# Patient Record
Sex: Female | Born: 1960 | Race: White | Hispanic: No | Smoking: Current every day smoker
Health system: Southern US, Community
[De-identification: ages and names within clinical notes are randomized; demographics above are authoritative.]

## PROBLEM LIST (undated history)

## (undated) DIAGNOSIS — Z9889 Other specified postprocedural states: Secondary | ICD-10-CM

## (undated) DIAGNOSIS — M545 Low back pain, unspecified: Secondary | ICD-10-CM

## (undated) DIAGNOSIS — M797 Fibromyalgia: Secondary | ICD-10-CM

## (undated) DIAGNOSIS — M1990 Unspecified osteoarthritis, unspecified site: Secondary | ICD-10-CM

## (undated) DIAGNOSIS — I10 Essential (primary) hypertension: Secondary | ICD-10-CM

## (undated) DIAGNOSIS — IMO0001 Reserved for inherently not codable concepts without codable children: Secondary | ICD-10-CM

## (undated) DIAGNOSIS — F329 Major depressive disorder, single episode, unspecified: Secondary | ICD-10-CM

## (undated) DIAGNOSIS — I499 Cardiac arrhythmia, unspecified: Secondary | ICD-10-CM

## (undated) DIAGNOSIS — G8929 Other chronic pain: Secondary | ICD-10-CM

## (undated) DIAGNOSIS — F32A Depression, unspecified: Secondary | ICD-10-CM

## (undated) DIAGNOSIS — J189 Pneumonia, unspecified organism: Secondary | ICD-10-CM

## (undated) DIAGNOSIS — M67919 Unspecified disorder of synovium and tendon, unspecified shoulder: Secondary | ICD-10-CM

## (undated) DIAGNOSIS — K219 Gastro-esophageal reflux disease without esophagitis: Secondary | ICD-10-CM

## (undated) DIAGNOSIS — F419 Anxiety disorder, unspecified: Secondary | ICD-10-CM

## (undated) DIAGNOSIS — J45998 Other asthma: Secondary | ICD-10-CM

## (undated) DIAGNOSIS — R112 Nausea with vomiting, unspecified: Secondary | ICD-10-CM

## (undated) HISTORY — DX: Low back pain, unspecified: M54.50

## (undated) HISTORY — DX: Major depressive disorder, single episode, unspecified: F32.9

## (undated) HISTORY — PX: BACK SURGERY: SHX140

## (undated) HISTORY — DX: Other chronic pain: G89.29

## (undated) HISTORY — PX: LUMBAR MICRODISCECTOMY: SHX99

## (undated) HISTORY — DX: Low back pain: M54.5

## (undated) HISTORY — DX: Essential (primary) hypertension: I10

## (undated) HISTORY — DX: Anxiety disorder, unspecified: F41.9

## (undated) HISTORY — PX: POSTERIOR LUMBAR FUSION: SHX6036

## (undated) HISTORY — DX: Depression, unspecified: F32.A

---

## 1984-02-22 HISTORY — PX: TUBAL LIGATION: SHX77

## 1990-02-21 HISTORY — PX: REPAIR DURAL / CSF LEAK: SUR1169

## 1991-03-25 HISTORY — PX: LUMBAR SPINE SURGERY: SHX701

## 1997-07-10 ENCOUNTER — Emergency Department (HOSPITAL_COMMUNITY): Admission: EM | Admit: 1997-07-10 | Discharge: 1997-07-10 | Payer: Self-pay | Admitting: Emergency Medicine

## 1997-08-29 ENCOUNTER — Emergency Department (HOSPITAL_COMMUNITY): Admission: EM | Admit: 1997-08-29 | Discharge: 1997-08-29 | Payer: Self-pay | Admitting: Emergency Medicine

## 1997-09-03 ENCOUNTER — Emergency Department (HOSPITAL_COMMUNITY): Admission: EM | Admit: 1997-09-03 | Discharge: 1997-09-03 | Payer: Self-pay | Admitting: Emergency Medicine

## 1997-12-15 ENCOUNTER — Emergency Department (HOSPITAL_COMMUNITY): Admission: EM | Admit: 1997-12-15 | Discharge: 1997-12-15 | Payer: Self-pay | Admitting: Emergency Medicine

## 1998-01-07 ENCOUNTER — Emergency Department (HOSPITAL_COMMUNITY): Admission: EM | Admit: 1998-01-07 | Discharge: 1998-01-07 | Payer: Self-pay | Admitting: Family Medicine

## 1998-03-21 ENCOUNTER — Emergency Department (HOSPITAL_COMMUNITY): Admission: EM | Admit: 1998-03-21 | Discharge: 1998-03-21 | Payer: Self-pay | Admitting: Emergency Medicine

## 1998-03-21 ENCOUNTER — Encounter: Payer: Self-pay | Admitting: Emergency Medicine

## 1998-05-16 ENCOUNTER — Emergency Department (HOSPITAL_COMMUNITY): Admission: EM | Admit: 1998-05-16 | Discharge: 1998-05-16 | Payer: Self-pay | Admitting: Emergency Medicine

## 1998-09-03 ENCOUNTER — Emergency Department (HOSPITAL_COMMUNITY): Admission: EM | Admit: 1998-09-03 | Discharge: 1998-09-03 | Payer: Self-pay

## 1998-09-23 ENCOUNTER — Emergency Department (HOSPITAL_COMMUNITY): Admission: EM | Admit: 1998-09-23 | Discharge: 1998-09-23 | Payer: Self-pay | Admitting: Emergency Medicine

## 1998-11-16 ENCOUNTER — Emergency Department (HOSPITAL_COMMUNITY): Admission: EM | Admit: 1998-11-16 | Discharge: 1998-11-16 | Payer: Self-pay | Admitting: Family Medicine

## 1998-11-16 ENCOUNTER — Encounter: Payer: Self-pay | Admitting: Emergency Medicine

## 1998-12-22 ENCOUNTER — Emergency Department (HOSPITAL_COMMUNITY): Admission: EM | Admit: 1998-12-22 | Discharge: 1998-12-22 | Payer: Self-pay | Admitting: Emergency Medicine

## 1999-01-20 ENCOUNTER — Other Ambulatory Visit: Admission: RE | Admit: 1999-01-20 | Discharge: 1999-01-20 | Payer: Self-pay | Admitting: Family Medicine

## 2000-01-27 ENCOUNTER — Emergency Department (HOSPITAL_COMMUNITY): Admission: EM | Admit: 2000-01-27 | Discharge: 2000-01-27 | Payer: Self-pay | Admitting: *Deleted

## 2000-07-31 ENCOUNTER — Emergency Department (HOSPITAL_COMMUNITY): Admission: EM | Admit: 2000-07-31 | Discharge: 2000-07-31 | Payer: Self-pay | Admitting: Emergency Medicine

## 2000-09-14 ENCOUNTER — Emergency Department (HOSPITAL_COMMUNITY): Admission: EM | Admit: 2000-09-14 | Discharge: 2000-09-14 | Payer: Self-pay | Admitting: Emergency Medicine

## 2001-03-28 ENCOUNTER — Emergency Department (HOSPITAL_COMMUNITY): Admission: EM | Admit: 2001-03-28 | Discharge: 2001-03-28 | Payer: Self-pay | Admitting: Physical Therapy

## 2001-05-10 ENCOUNTER — Encounter: Payer: Self-pay | Admitting: Family Medicine

## 2001-05-10 ENCOUNTER — Encounter: Admission: RE | Admit: 2001-05-10 | Discharge: 2001-05-10 | Payer: Self-pay | Admitting: Family Medicine

## 2001-05-29 ENCOUNTER — Other Ambulatory Visit: Admission: RE | Admit: 2001-05-29 | Discharge: 2001-05-29 | Payer: Self-pay | Admitting: Family Medicine

## 2001-07-03 ENCOUNTER — Encounter: Payer: Self-pay | Admitting: Family Medicine

## 2001-07-03 ENCOUNTER — Encounter: Admission: RE | Admit: 2001-07-03 | Discharge: 2001-07-03 | Payer: Self-pay | Admitting: Family Medicine

## 2001-07-09 ENCOUNTER — Encounter: Payer: Self-pay | Admitting: *Deleted

## 2001-07-09 ENCOUNTER — Ambulatory Visit (HOSPITAL_COMMUNITY): Admission: RE | Admit: 2001-07-09 | Discharge: 2001-07-09 | Payer: Self-pay | Admitting: *Deleted

## 2001-07-19 ENCOUNTER — Ambulatory Visit (HOSPITAL_COMMUNITY): Admission: RE | Admit: 2001-07-19 | Discharge: 2001-07-19 | Payer: Self-pay | Admitting: *Deleted

## 2001-07-19 ENCOUNTER — Encounter: Payer: Self-pay | Admitting: *Deleted

## 2001-07-23 ENCOUNTER — Encounter: Payer: Self-pay | Admitting: *Deleted

## 2001-07-23 ENCOUNTER — Ambulatory Visit (HOSPITAL_COMMUNITY): Admission: RE | Admit: 2001-07-23 | Discharge: 2001-07-24 | Payer: Self-pay | Admitting: *Deleted

## 2001-11-07 ENCOUNTER — Encounter: Payer: Self-pay | Admitting: *Deleted

## 2001-11-07 ENCOUNTER — Ambulatory Visit (HOSPITAL_COMMUNITY): Admission: RE | Admit: 2001-11-07 | Discharge: 2001-11-07 | Payer: Self-pay | Admitting: *Deleted

## 2002-07-23 ENCOUNTER — Other Ambulatory Visit: Admission: RE | Admit: 2002-07-23 | Discharge: 2002-07-23 | Payer: Self-pay | Admitting: Family Medicine

## 2002-10-22 ENCOUNTER — Emergency Department (HOSPITAL_COMMUNITY): Admission: EM | Admit: 2002-10-22 | Discharge: 2002-10-22 | Payer: Self-pay | Admitting: Emergency Medicine

## 2002-10-31 ENCOUNTER — Emergency Department (HOSPITAL_COMMUNITY): Admission: EM | Admit: 2002-10-31 | Discharge: 2002-10-31 | Payer: Self-pay

## 2002-12-23 ENCOUNTER — Emergency Department (HOSPITAL_COMMUNITY): Admission: EM | Admit: 2002-12-23 | Discharge: 2002-12-23 | Payer: Self-pay | Admitting: Emergency Medicine

## 2003-01-09 ENCOUNTER — Emergency Department (HOSPITAL_COMMUNITY): Admission: EM | Admit: 2003-01-09 | Discharge: 2003-01-09 | Payer: Self-pay | Admitting: Emergency Medicine

## 2003-02-12 ENCOUNTER — Emergency Department (HOSPITAL_COMMUNITY): Admission: EM | Admit: 2003-02-12 | Discharge: 2003-02-12 | Payer: Self-pay | Admitting: Emergency Medicine

## 2003-03-19 ENCOUNTER — Emergency Department (HOSPITAL_COMMUNITY): Admission: EM | Admit: 2003-03-19 | Discharge: 2003-03-19 | Payer: Self-pay | Admitting: Emergency Medicine

## 2003-04-10 ENCOUNTER — Ambulatory Visit (HOSPITAL_COMMUNITY): Admission: RE | Admit: 2003-04-10 | Discharge: 2003-04-10 | Payer: Self-pay | Admitting: *Deleted

## 2003-05-01 ENCOUNTER — Ambulatory Visit (HOSPITAL_COMMUNITY): Admission: RE | Admit: 2003-05-01 | Discharge: 2003-05-01 | Payer: Self-pay | Admitting: *Deleted

## 2003-05-19 ENCOUNTER — Emergency Department (HOSPITAL_COMMUNITY): Admission: EM | Admit: 2003-05-19 | Discharge: 2003-05-19 | Payer: Self-pay | Admitting: Emergency Medicine

## 2003-05-26 ENCOUNTER — Emergency Department (HOSPITAL_COMMUNITY): Admission: EM | Admit: 2003-05-26 | Discharge: 2003-05-27 | Payer: Self-pay | Admitting: Emergency Medicine

## 2003-08-18 ENCOUNTER — Emergency Department (HOSPITAL_COMMUNITY): Admission: EM | Admit: 2003-08-18 | Discharge: 2003-08-18 | Payer: Self-pay | Admitting: Internal Medicine

## 2003-11-26 ENCOUNTER — Emergency Department (HOSPITAL_COMMUNITY): Admission: EM | Admit: 2003-11-26 | Discharge: 2003-11-26 | Payer: Self-pay | Admitting: Emergency Medicine

## 2004-01-12 ENCOUNTER — Emergency Department (HOSPITAL_COMMUNITY): Admission: EM | Admit: 2004-01-12 | Discharge: 2004-01-12 | Payer: Self-pay | Admitting: Emergency Medicine

## 2004-01-22 ENCOUNTER — Emergency Department (HOSPITAL_COMMUNITY): Admission: EM | Admit: 2004-01-22 | Discharge: 2004-01-22 | Payer: Self-pay | Admitting: Unknown Physician Specialty

## 2004-01-31 ENCOUNTER — Emergency Department (HOSPITAL_COMMUNITY): Admission: EM | Admit: 2004-01-31 | Discharge: 2004-02-01 | Payer: Self-pay | Admitting: Emergency Medicine

## 2004-02-02 ENCOUNTER — Emergency Department (HOSPITAL_COMMUNITY): Admission: EM | Admit: 2004-02-02 | Discharge: 2004-02-03 | Payer: Self-pay | Admitting: Emergency Medicine

## 2004-02-11 ENCOUNTER — Inpatient Hospital Stay (HOSPITAL_COMMUNITY): Admission: EM | Admit: 2004-02-11 | Discharge: 2004-02-15 | Payer: Self-pay | Admitting: Emergency Medicine

## 2004-02-11 ENCOUNTER — Ambulatory Visit: Payer: Self-pay | Admitting: Internal Medicine

## 2004-02-26 ENCOUNTER — Emergency Department (HOSPITAL_COMMUNITY): Admission: EM | Admit: 2004-02-26 | Discharge: 2004-02-26 | Payer: Self-pay | Admitting: Family Medicine

## 2004-07-06 ENCOUNTER — Emergency Department (HOSPITAL_COMMUNITY): Admission: EM | Admit: 2004-07-06 | Discharge: 2004-07-06 | Payer: Self-pay | Admitting: Emergency Medicine

## 2004-08-18 ENCOUNTER — Emergency Department (HOSPITAL_COMMUNITY): Admission: EM | Admit: 2004-08-18 | Discharge: 2004-08-18 | Payer: Self-pay | Admitting: Emergency Medicine

## 2004-11-23 ENCOUNTER — Emergency Department (HOSPITAL_COMMUNITY): Admission: EM | Admit: 2004-11-23 | Discharge: 2004-11-23 | Payer: Self-pay | Admitting: Emergency Medicine

## 2005-02-17 ENCOUNTER — Emergency Department (HOSPITAL_COMMUNITY): Admission: EM | Admit: 2005-02-17 | Discharge: 2005-02-17 | Payer: Self-pay | Admitting: Emergency Medicine

## 2005-03-30 ENCOUNTER — Emergency Department (HOSPITAL_COMMUNITY): Admission: EM | Admit: 2005-03-30 | Discharge: 2005-03-31 | Payer: Self-pay | Admitting: Emergency Medicine

## 2005-05-26 ENCOUNTER — Emergency Department (HOSPITAL_COMMUNITY): Admission: EM | Admit: 2005-05-26 | Discharge: 2005-05-26 | Payer: Self-pay | Admitting: Emergency Medicine

## 2005-06-03 ENCOUNTER — Emergency Department (HOSPITAL_COMMUNITY): Admission: EM | Admit: 2005-06-03 | Discharge: 2005-06-03 | Payer: Self-pay | Admitting: Emergency Medicine

## 2005-08-17 ENCOUNTER — Emergency Department (HOSPITAL_COMMUNITY): Admission: EM | Admit: 2005-08-17 | Discharge: 2005-08-17 | Payer: Self-pay | Admitting: Emergency Medicine

## 2005-08-24 ENCOUNTER — Emergency Department (HOSPITAL_COMMUNITY): Admission: EM | Admit: 2005-08-24 | Discharge: 2005-08-24 | Payer: Self-pay | Admitting: Emergency Medicine

## 2005-09-15 ENCOUNTER — Emergency Department (HOSPITAL_COMMUNITY): Admission: EM | Admit: 2005-09-15 | Discharge: 2005-09-15 | Payer: Self-pay | Admitting: Emergency Medicine

## 2006-01-20 ENCOUNTER — Emergency Department (HOSPITAL_COMMUNITY): Admission: EM | Admit: 2006-01-20 | Discharge: 2006-01-20 | Payer: Self-pay | Admitting: Emergency Medicine

## 2006-03-06 ENCOUNTER — Emergency Department (HOSPITAL_COMMUNITY): Admission: EM | Admit: 2006-03-06 | Discharge: 2006-03-06 | Payer: Self-pay | Admitting: Emergency Medicine

## 2006-03-15 ENCOUNTER — Ambulatory Visit: Payer: Self-pay | Admitting: Internal Medicine

## 2006-03-20 ENCOUNTER — Emergency Department (HOSPITAL_COMMUNITY): Admission: EM | Admit: 2006-03-20 | Discharge: 2006-03-20 | Payer: Self-pay | Admitting: Emergency Medicine

## 2006-03-27 ENCOUNTER — Ambulatory Visit: Payer: Self-pay | Admitting: *Deleted

## 2006-05-21 ENCOUNTER — Emergency Department (HOSPITAL_COMMUNITY): Admission: EM | Admit: 2006-05-21 | Discharge: 2006-05-21 | Payer: Self-pay | Admitting: Emergency Medicine

## 2006-07-04 ENCOUNTER — Emergency Department (HOSPITAL_COMMUNITY): Admission: EM | Admit: 2006-07-04 | Discharge: 2006-07-04 | Payer: Self-pay | Admitting: *Deleted

## 2006-09-01 ENCOUNTER — Emergency Department (HOSPITAL_COMMUNITY): Admission: EM | Admit: 2006-09-01 | Discharge: 2006-09-01 | Payer: Self-pay | Admitting: Emergency Medicine

## 2006-11-08 ENCOUNTER — Encounter (INDEPENDENT_AMBULATORY_CARE_PROVIDER_SITE_OTHER): Payer: Self-pay | Admitting: *Deleted

## 2006-11-17 ENCOUNTER — Emergency Department (HOSPITAL_COMMUNITY): Admission: EM | Admit: 2006-11-17 | Discharge: 2006-11-17 | Payer: Self-pay | Admitting: Emergency Medicine

## 2006-11-17 ENCOUNTER — Ambulatory Visit: Payer: Self-pay | Admitting: *Deleted

## 2006-11-18 ENCOUNTER — Emergency Department (HOSPITAL_COMMUNITY): Admission: EM | Admit: 2006-11-18 | Discharge: 2006-11-18 | Payer: Self-pay | Admitting: Emergency Medicine

## 2006-11-20 ENCOUNTER — Emergency Department (HOSPITAL_COMMUNITY): Admission: EM | Admit: 2006-11-20 | Discharge: 2006-11-20 | Payer: Self-pay | Admitting: Emergency Medicine

## 2006-11-23 ENCOUNTER — Emergency Department (HOSPITAL_COMMUNITY): Admission: EM | Admit: 2006-11-23 | Discharge: 2006-11-23 | Payer: Self-pay | Admitting: Emergency Medicine

## 2006-12-15 ENCOUNTER — Emergency Department (HOSPITAL_COMMUNITY): Admission: EM | Admit: 2006-12-15 | Discharge: 2006-12-15 | Payer: Self-pay | Admitting: *Deleted

## 2007-01-19 ENCOUNTER — Emergency Department (HOSPITAL_COMMUNITY): Admission: EM | Admit: 2007-01-19 | Discharge: 2007-01-19 | Payer: Self-pay | Admitting: Emergency Medicine

## 2007-02-02 ENCOUNTER — Emergency Department (HOSPITAL_COMMUNITY): Admission: EM | Admit: 2007-02-02 | Discharge: 2007-02-02 | Payer: Self-pay | Admitting: *Deleted

## 2007-05-05 ENCOUNTER — Emergency Department (HOSPITAL_COMMUNITY): Admission: EM | Admit: 2007-05-05 | Discharge: 2007-05-06 | Payer: Self-pay | Admitting: Emergency Medicine

## 2007-05-21 ENCOUNTER — Emergency Department (HOSPITAL_COMMUNITY): Admission: EM | Admit: 2007-05-21 | Discharge: 2007-05-21 | Payer: Self-pay | Admitting: Emergency Medicine

## 2007-06-21 ENCOUNTER — Emergency Department (HOSPITAL_COMMUNITY): Admission: EM | Admit: 2007-06-21 | Discharge: 2007-06-21 | Payer: Self-pay | Admitting: Emergency Medicine

## 2007-07-01 ENCOUNTER — Emergency Department (HOSPITAL_COMMUNITY): Admission: EM | Admit: 2007-07-01 | Discharge: 2007-07-01 | Payer: Self-pay | Admitting: Emergency Medicine

## 2007-07-09 ENCOUNTER — Emergency Department (HOSPITAL_COMMUNITY): Admission: EM | Admit: 2007-07-09 | Discharge: 2007-07-09 | Payer: Self-pay | Admitting: Emergency Medicine

## 2007-10-17 ENCOUNTER — Emergency Department (HOSPITAL_COMMUNITY): Admission: EM | Admit: 2007-10-17 | Discharge: 2007-10-17 | Payer: Self-pay | Admitting: Emergency Medicine

## 2007-11-17 ENCOUNTER — Emergency Department (HOSPITAL_COMMUNITY): Admission: EM | Admit: 2007-11-17 | Discharge: 2007-11-17 | Payer: Self-pay | Admitting: Emergency Medicine

## 2008-02-27 ENCOUNTER — Emergency Department (HOSPITAL_COMMUNITY): Admission: EM | Admit: 2008-02-27 | Discharge: 2008-02-27 | Payer: Self-pay | Admitting: Emergency Medicine

## 2008-04-02 ENCOUNTER — Emergency Department (HOSPITAL_COMMUNITY): Admission: EM | Admit: 2008-04-02 | Discharge: 2008-04-02 | Payer: Self-pay | Admitting: Emergency Medicine

## 2008-04-22 ENCOUNTER — Emergency Department (HOSPITAL_COMMUNITY): Admission: EM | Admit: 2008-04-22 | Discharge: 2008-04-22 | Payer: Self-pay | Admitting: Emergency Medicine

## 2008-05-02 ENCOUNTER — Emergency Department (HOSPITAL_COMMUNITY): Admission: EM | Admit: 2008-05-02 | Discharge: 2008-05-02 | Payer: Self-pay | Admitting: Emergency Medicine

## 2008-05-02 ENCOUNTER — Emergency Department: Payer: Self-pay | Admitting: Internal Medicine

## 2008-05-21 ENCOUNTER — Inpatient Hospital Stay (HOSPITAL_COMMUNITY): Admission: AD | Admit: 2008-05-21 | Discharge: 2008-05-21 | Payer: Self-pay | Admitting: Obstetrics & Gynecology

## 2008-06-06 ENCOUNTER — Emergency Department (HOSPITAL_COMMUNITY): Admission: EM | Admit: 2008-06-06 | Discharge: 2008-06-06 | Payer: Self-pay | Admitting: Emergency Medicine

## 2008-06-13 ENCOUNTER — Emergency Department (HOSPITAL_COMMUNITY): Admission: EM | Admit: 2008-06-13 | Discharge: 2008-06-13 | Payer: Self-pay | Admitting: Emergency Medicine

## 2008-07-02 ENCOUNTER — Emergency Department (HOSPITAL_COMMUNITY): Admission: EM | Admit: 2008-07-02 | Discharge: 2008-07-02 | Payer: Self-pay | Admitting: Emergency Medicine

## 2008-07-18 ENCOUNTER — Observation Stay (HOSPITAL_COMMUNITY): Admission: EM | Admit: 2008-07-18 | Discharge: 2008-07-18 | Payer: Self-pay | Admitting: Emergency Medicine

## 2008-08-06 ENCOUNTER — Emergency Department (HOSPITAL_COMMUNITY): Admission: EM | Admit: 2008-08-06 | Discharge: 2008-08-06 | Payer: Self-pay | Admitting: Emergency Medicine

## 2008-08-14 ENCOUNTER — Emergency Department (HOSPITAL_COMMUNITY): Admission: EM | Admit: 2008-08-14 | Discharge: 2008-08-14 | Payer: Self-pay | Admitting: Emergency Medicine

## 2008-08-23 ENCOUNTER — Inpatient Hospital Stay (HOSPITAL_COMMUNITY): Admission: EM | Admit: 2008-08-23 | Discharge: 2008-08-25 | Payer: Self-pay | Admitting: Emergency Medicine

## 2008-08-23 ENCOUNTER — Ambulatory Visit: Payer: Self-pay | Admitting: Cardiology

## 2008-08-25 ENCOUNTER — Emergency Department (HOSPITAL_COMMUNITY): Admission: EM | Admit: 2008-08-25 | Discharge: 2008-08-25 | Payer: Self-pay | Admitting: Emergency Medicine

## 2008-09-11 ENCOUNTER — Emergency Department (HOSPITAL_COMMUNITY): Admission: EM | Admit: 2008-09-11 | Discharge: 2008-09-11 | Payer: Self-pay | Admitting: Emergency Medicine

## 2008-10-06 ENCOUNTER — Encounter (INDEPENDENT_AMBULATORY_CARE_PROVIDER_SITE_OTHER): Payer: Self-pay | Admitting: Internal Medicine

## 2008-10-09 ENCOUNTER — Emergency Department (HOSPITAL_COMMUNITY): Admission: EM | Admit: 2008-10-09 | Discharge: 2008-10-10 | Payer: Self-pay | Admitting: Emergency Medicine

## 2008-11-05 ENCOUNTER — Emergency Department (HOSPITAL_COMMUNITY): Admission: EM | Admit: 2008-11-05 | Discharge: 2008-11-05 | Payer: Self-pay | Admitting: Emergency Medicine

## 2008-12-02 ENCOUNTER — Emergency Department (HOSPITAL_COMMUNITY): Admission: EM | Admit: 2008-12-02 | Discharge: 2008-12-02 | Payer: Self-pay | Admitting: Emergency Medicine

## 2008-12-07 ENCOUNTER — Emergency Department (HOSPITAL_COMMUNITY): Admission: EM | Admit: 2008-12-07 | Discharge: 2008-12-07 | Payer: Self-pay | Admitting: Emergency Medicine

## 2008-12-29 ENCOUNTER — Inpatient Hospital Stay (HOSPITAL_COMMUNITY): Admission: AD | Admit: 2008-12-29 | Discharge: 2008-12-29 | Payer: Self-pay | Admitting: Family Medicine

## 2009-01-05 ENCOUNTER — Telehealth (INDEPENDENT_AMBULATORY_CARE_PROVIDER_SITE_OTHER): Payer: Self-pay | Admitting: *Deleted

## 2009-02-03 ENCOUNTER — Emergency Department (HOSPITAL_COMMUNITY): Admission: EM | Admit: 2009-02-03 | Discharge: 2009-02-03 | Payer: Self-pay | Admitting: Emergency Medicine

## 2009-02-07 ENCOUNTER — Emergency Department (HOSPITAL_COMMUNITY): Admission: EM | Admit: 2009-02-07 | Discharge: 2009-02-07 | Payer: Self-pay | Admitting: Emergency Medicine

## 2009-02-12 ENCOUNTER — Emergency Department (HOSPITAL_COMMUNITY): Admission: EM | Admit: 2009-02-12 | Discharge: 2009-02-12 | Payer: Self-pay | Admitting: Emergency Medicine

## 2009-02-24 ENCOUNTER — Emergency Department (HOSPITAL_COMMUNITY): Admission: EM | Admit: 2009-02-24 | Discharge: 2009-02-24 | Payer: Self-pay | Admitting: Emergency Medicine

## 2009-02-25 ENCOUNTER — Emergency Department (HOSPITAL_COMMUNITY): Admission: EM | Admit: 2009-02-25 | Discharge: 2009-02-25 | Payer: Self-pay | Admitting: Emergency Medicine

## 2009-03-05 ENCOUNTER — Emergency Department (HOSPITAL_COMMUNITY): Admission: EM | Admit: 2009-03-05 | Discharge: 2009-03-05 | Payer: Self-pay | Admitting: Emergency Medicine

## 2009-03-14 ENCOUNTER — Emergency Department (HOSPITAL_COMMUNITY): Admission: EM | Admit: 2009-03-14 | Discharge: 2009-03-14 | Payer: Self-pay | Admitting: Emergency Medicine

## 2009-05-26 ENCOUNTER — Encounter: Admission: RE | Admit: 2009-05-26 | Discharge: 2009-05-26 | Payer: Self-pay | Admitting: Neurosurgery

## 2009-06-05 ENCOUNTER — Inpatient Hospital Stay (HOSPITAL_COMMUNITY): Admission: RE | Admit: 2009-06-05 | Discharge: 2009-06-10 | Payer: Self-pay | Admitting: Neurosurgery

## 2009-08-06 ENCOUNTER — Emergency Department (HOSPITAL_COMMUNITY): Admission: EM | Admit: 2009-08-06 | Discharge: 2009-08-06 | Payer: Self-pay | Admitting: Emergency Medicine

## 2009-08-23 ENCOUNTER — Emergency Department (HOSPITAL_COMMUNITY): Admission: EM | Admit: 2009-08-23 | Discharge: 2009-08-23 | Payer: Self-pay | Admitting: Emergency Medicine

## 2009-11-03 ENCOUNTER — Other Ambulatory Visit: Payer: Self-pay | Admitting: Emergency Medicine

## 2009-11-04 ENCOUNTER — Inpatient Hospital Stay (HOSPITAL_COMMUNITY): Admission: RE | Admit: 2009-11-04 | Discharge: 2009-11-10 | Payer: Self-pay | Admitting: Psychiatry

## 2009-11-04 ENCOUNTER — Ambulatory Visit: Payer: Self-pay | Admitting: Psychiatry

## 2009-11-18 ENCOUNTER — Emergency Department (HOSPITAL_COMMUNITY): Admission: EM | Admit: 2009-11-18 | Discharge: 2009-11-18 | Payer: Self-pay | Admitting: Emergency Medicine

## 2009-11-29 ENCOUNTER — Emergency Department (HOSPITAL_COMMUNITY): Admission: EM | Admit: 2009-11-29 | Discharge: 2009-11-29 | Payer: Self-pay | Admitting: Emergency Medicine

## 2009-12-04 ENCOUNTER — Other Ambulatory Visit: Admission: RE | Admit: 2009-12-04 | Discharge: 2009-12-04 | Payer: Self-pay | Admitting: Family Medicine

## 2010-01-14 ENCOUNTER — Emergency Department (HOSPITAL_COMMUNITY): Admission: EM | Admit: 2010-01-14 | Discharge: 2010-01-14 | Payer: Self-pay | Admitting: Emergency Medicine

## 2010-01-28 ENCOUNTER — Emergency Department (HOSPITAL_COMMUNITY): Admission: EM | Admit: 2010-01-28 | Discharge: 2009-08-27 | Payer: Self-pay | Admitting: Emergency Medicine

## 2010-02-05 ENCOUNTER — Ambulatory Visit (HOSPITAL_COMMUNITY): Admission: RE | Admit: 2010-02-05 | Payer: Self-pay | Source: Home / Self Care | Admitting: Neurosurgery

## 2010-02-21 HISTORY — PX: SPINAL CORD STIMULATOR IMPLANT: SHX2422

## 2010-02-23 ENCOUNTER — Inpatient Hospital Stay (HOSPITAL_COMMUNITY)
Admission: RE | Admit: 2010-02-23 | Discharge: 2010-03-01 | Payer: Self-pay | Source: Home / Self Care | Attending: Psychiatry | Admitting: Psychiatry

## 2010-02-23 ENCOUNTER — Emergency Department (HOSPITAL_COMMUNITY)
Admission: EM | Admit: 2010-02-23 | Discharge: 2010-02-23 | Disposition: A | Discharge status: Other Acute Inpatient Hospital | Payer: Self-pay | Source: Home / Self Care | Admitting: Emergency Medicine

## 2010-02-24 LAB — GLUCOSE, CAPILLARY
Glucose-Capillary: 113 mg/dL — ABNORMAL HIGH (ref 70–99)
Glucose-Capillary: 111 mg/dL — ABNORMAL HIGH (ref 70–99)

## 2010-02-25 LAB — GLUCOSE, CAPILLARY
Glucose-Capillary: 108 mg/dL — ABNORMAL HIGH (ref 70–99)
Glucose-Capillary: 111 mg/dL — ABNORMAL HIGH (ref 70–99)

## 2010-03-08 LAB — GLUCOSE, CAPILLARY
Glucose-Capillary: 96 mg/dL (ref 70–99)
Glucose-Capillary: 112 mg/dL — ABNORMAL HIGH (ref 70–99)
Glucose-Capillary: 105 mg/dL — ABNORMAL HIGH (ref 70–99)
Glucose-Capillary: 99 mg/dL (ref 70–99)
Glucose-Capillary: 105 mg/dL — ABNORMAL HIGH (ref 70–99)
Glucose-Capillary: 91 mg/dL (ref 70–99)
Glucose-Capillary: 92 mg/dL (ref 70–99)

## 2010-03-13 ENCOUNTER — Encounter: Payer: Self-pay | Admitting: *Deleted

## 2010-03-15 ENCOUNTER — Emergency Department (HOSPITAL_COMMUNITY)
Admission: EM | Admit: 2010-03-15 | Discharge: 2010-03-15 | Payer: Self-pay | Source: Home / Self Care | Admitting: Emergency Medicine

## 2010-03-23 NOTE — Miscellaneous (Signed)
Summary: VIP  Patient: [name] Note: All result statuses are Final unless otherwise noted.  Tests: (1) VIP (Medications)   LLIMPORTMEDS              "Result Below..."       RESULT: TRAMADOL HCL TABS 50 MG*TAKE ONE TABLET BY MOUTH EVERY EIGHT HOURS AS NEEDED*03/24/2006*Last Refill: [id number]***   LLIMPORTMEDS              "Result Below..."       RESULT: NAPROXEN TABS 500 MG*TAKE ONE TABLET BY MOUTH EVERY EIGHT HOURS AS NEEDED FOR PAIN*12/19/2006*Last Refill: [id number]***   LLIMPORTALLS              NKDA***  Note: An exclamation mark (!) indicates a result that was not dispersed into the flowsheet. Document Creation Date: 12/21/2006 3:00 PM _______________________________________________________________________  (1) Order result status: Final Collection or observation date-time: 11/08/2006 Requested date-time: 11/08/2006 Receipt date-time:  Reported date-time: 11/08/2006 Referring Physician:   Ordering Physician:   Specimen Source:  Source: [facility] [place] Order Number:  Lab site:

## 2010-03-23 NOTE — Consult Note (Signed)
Summary: Elevated Cardiac Enzyme/[facility]  Elevated Cardiac Enzyme/[facility]   Imported By: Sherian Rein 09/03/2008 11:55:57  _____________________________________________________________________  External Attachment:    Type:   Image     Comment:   External Document

## 2010-03-23 NOTE — Progress Notes (Signed)
Summary: triage/HTN  Phone Note Call from Patient   Reason for Call: Talk to Nurse Summary of Call: Madeline Dickerson called stating [facility] and she told them she had an appointment with [place] on 12/7...however that is an eligibity appointment which she rescheduled due to conflicting court date...[facility] KitchenMar[facility]Sent[facility]N because she has been off BP meds and while she was there her BP got up to 168/117...later it was 159/83.Marland KitchenMarland KitchenMarland KitchenThey gave her a RX for Lisinopril 20mg  daily #30.Marland Kitchen She hasn't been here in over 2 years.Marland KitchenMarland KitchenMarland KitchenI told her we could get her in for a one time visit for HTN, but that she would also need fo be recertified before seen again.Marland KitchenMarland KitchenShe said she wanted to be seen for her back pain too.Marland KitchenMarland KitchenI told her the acute appointment was for her HTN and we could address other issues after the recert..Patient did not want to make appointment for HTN...at this time.Marland Kitchenand would come in on 12/17 for recert...(first avail) and make an appointment then....she is also aware of walk in eligibilty.Marland KitchenMarland KitchenShe states she will take the BP medicine and have her BP checked at pharmacy..She will call us if any problems with HTN...[facility] appointment to F/U with her uterine bleeding. [facility] Discharge notes reviewed. Initial call taken by: Conchita Paris,  January 05, 2009 12:47 PM

## 2010-03-23 NOTE — Letter (Signed)
Summary: FAXED REQUESTED RECORDS TO [facility]  FAXED REQUESTED RECORDS TO [facility]   Imported By: Arta Bruce 10/06/2008 11:01:01  _____________________________________________________________________  External Attachment:    Type:   Image     Comment:   External Document

## 2010-04-08 ENCOUNTER — Encounter (HOSPITAL_COMMUNITY)
Admission: RE | Admit: 2010-04-08 | Discharge: 2010-04-08 | Disposition: A | Discharge status: Home / Self Care | Payer: Medicare Other | Source: Ambulatory Visit | Attending: Neurosurgery | Admitting: Neurosurgery

## 2010-04-08 DIAGNOSIS — Z01812 Encounter for preprocedural laboratory examination: Secondary | ICD-10-CM | POA: Insufficient documentation

## 2010-04-08 LAB — BASIC METABOLIC PANEL
BUN: 12 mg/dL (ref 6–23)
CO2: 19 mEq/L (ref 19–32)
Calcium: 9 mg/dL (ref 8.4–10.5)
Chloride: 109 mEq/L (ref 96–112)
Creatinine, Ser: 0.72 mg/dL (ref 0.4–1.2)
GFR calc Af Amer: >60 mL/min (ref >60)
GFR calc non Af Amer: >60 mL/min (ref >60)
Glucose, Bld: 123 mg/dL — ABNORMAL HIGH (ref 70–99)
Potassium: 4.7 mEq/L (ref 3.5–5.1)
Sodium: 137 mEq/L (ref 135–145)

## 2010-04-08 LAB — CBC
HCT: 35.9 % — ABNORMAL LOW (ref 36.0–46.0)
Hemoglobin: 11.8 g/dL — ABNORMAL LOW (ref 12.0–15.0)
MCH: 31 pg (ref 26.0–34.0)
MCHC: 32.9 g/dL (ref 30.0–36.0)
MCV: 94.2 fL (ref 78.0–100.0)
Platelets: 375 10*3/uL (ref 150–400)
RBC: 3.81 MIL/uL — ABNORMAL LOW (ref 3.87–5.11)
RDW: 14.6 % (ref 11.5–15.5)
WBC: 8.5 10*3/uL (ref 4.0–10.5)

## 2010-04-08 LAB — SURGICAL PCR SCREEN
MRSA, PCR: NEGATIVE
Staphylococcus aureus: NEGATIVE

## 2010-04-09 ENCOUNTER — Observation Stay (HOSPITAL_COMMUNITY)
Admission: RE | Admit: 2010-04-09 | Discharge: 2010-04-09 | Disposition: A | Discharge status: Home / Self Care | Payer: Medicare Other | Source: Ambulatory Visit | Attending: Neurosurgery | Admitting: Neurosurgery

## 2010-04-09 ENCOUNTER — Inpatient Hospital Stay (HOSPITAL_COMMUNITY): Payer: Medicare Other

## 2010-04-09 DIAGNOSIS — I10 Essential (primary) hypertension: Secondary | ICD-10-CM | POA: Insufficient documentation

## 2010-04-09 DIAGNOSIS — G8929 Other chronic pain: Principal | ICD-10-CM | POA: Insufficient documentation

## 2010-04-09 DIAGNOSIS — E119 Type 2 diabetes mellitus without complications: Secondary | ICD-10-CM | POA: Insufficient documentation

## 2010-04-09 DIAGNOSIS — F17200 Nicotine dependence, unspecified, uncomplicated: Secondary | ICD-10-CM | POA: Insufficient documentation

## 2010-04-09 DIAGNOSIS — E669 Obesity, unspecified: Secondary | ICD-10-CM | POA: Insufficient documentation

## 2010-04-09 DIAGNOSIS — Z01812 Encounter for preprocedural laboratory examination: Secondary | ICD-10-CM | POA: Insufficient documentation

## 2010-04-09 DIAGNOSIS — K219 Gastro-esophageal reflux disease without esophagitis: Secondary | ICD-10-CM | POA: Insufficient documentation

## 2010-04-09 DIAGNOSIS — M79609 Pain in unspecified limb: Secondary | ICD-10-CM | POA: Insufficient documentation

## 2010-04-09 LAB — GLUCOSE, CAPILLARY
Glucose-Capillary: 113 mg/dL — ABNORMAL HIGH (ref 70–99)
Glucose-Capillary: 86 mg/dL (ref 70–99)

## 2010-04-11 ENCOUNTER — Emergency Department (HOSPITAL_COMMUNITY)
Admission: EM | Admit: 2010-04-11 | Discharge: 2010-04-11 | Disposition: A | Discharge status: Home / Self Care | Payer: Medicare Other | Attending: Emergency Medicine | Admitting: Emergency Medicine

## 2010-04-11 DIAGNOSIS — G8929 Other chronic pain: Secondary | ICD-10-CM | POA: Insufficient documentation

## 2010-04-11 DIAGNOSIS — F329 Major depressive disorder, single episode, unspecified: Secondary | ICD-10-CM | POA: Insufficient documentation

## 2010-04-11 DIAGNOSIS — M545 Low back pain, unspecified: Secondary | ICD-10-CM | POA: Insufficient documentation

## 2010-04-11 DIAGNOSIS — Z79899 Other long term (current) drug therapy: Secondary | ICD-10-CM | POA: Insufficient documentation

## 2010-04-11 DIAGNOSIS — I10 Essential (primary) hypertension: Secondary | ICD-10-CM | POA: Insufficient documentation

## 2010-04-11 DIAGNOSIS — F3289 Other specified depressive episodes: Secondary | ICD-10-CM | POA: Insufficient documentation

## 2010-04-11 DIAGNOSIS — E119 Type 2 diabetes mellitus without complications: Secondary | ICD-10-CM | POA: Insufficient documentation

## 2010-04-16 NOTE — Op Note (Signed)
  Madeline Dickerson, Madeline Dickerson                 ACCOUNT NO.:  [id number]  MEDICAL RECORD NO.:  [id number]           PATIENT TYPE:  I  LOCATION:  3535                         FACILITY:  [facility]  PHYSICIAN:  Coletta Memos, M.D.     DATE OF BIRTH:  10/18/1960  DATE OF PROCEDURE:  04/09/2010 DATE OF DISCHARGE:  04/09/2010                              OPERATIVE REPORT   PREOPERATIVE DIAGNOSIS:  Chronic lower extremity pain.  POSTOPERATIVE DIAGNOSIS:  Chronic lower extremity pain.  PROCEDURE:  T10 laminectomy for placement of spinal cord stimulator.  COMPLICATIONS:  None.  SURGEON:  Coletta Memos, MD  INDICATIONS:  [name] has undergone a trial for spinal cord stimulator. It was successful.  She is admitted today, so we can place the spinal cord stimulator open via laminectomy.  OPERATIVE NOTE:  [name] was brought to the operating room, intubated, placed under general anesthetic without difficulty.  She was rolled prone onto [place] table and all pressure points were properly padded. I localized the equivalent of what was T8 from the preoperative film during her trial but I actually counted it to be T7.  But nevertheless, it was by counting ribs in the correct location.  I, therefore, opened the incision and performed the laminectomy at what we were calling T10. I removed the ligamentum flavum and exposed the thecal sac.  I then placed the foam for the large paddle.  I placed easily.  I then placed the large paddle and tied it few times to get more towards the midline, but it only deviated slightly towards the right side.  Nevertheless, that is where it wound up.  I then made a tunnel to a pocket, I created beneath the hip on the left side subcutaneous.  I tunneled the leads, connected those to the rechargeable battery, placed that in the pocket, closed the pocket, anchored the leads to the thoracolumbar fascia after reapproximating that, and then completed closing the 2 wounds in the midline  and then the left hip region.  I used Oxyseal on both wounds for sterile dressing.  She tolerated the procedure well.          ______________________________ Coletta Memos, M.D.     KC/MEDQ  D:  04/09/2010  T:  04/10/2010  Job:  [id number]  Electronically Signed by Coletta Memos M.D. on 04/16/2010 09:26:12 AM

## 2010-04-19 ENCOUNTER — Emergency Department (HOSPITAL_COMMUNITY)
Admission: EM | Admit: 2010-04-19 | Discharge: 2010-04-19 | Disposition: A | Discharge status: Home / Self Care | Payer: Medicare Other | Attending: Emergency Medicine | Admitting: Emergency Medicine

## 2010-04-19 DIAGNOSIS — I10 Essential (primary) hypertension: Secondary | ICD-10-CM | POA: Insufficient documentation

## 2010-04-19 DIAGNOSIS — Z9889 Other specified postprocedural states: Secondary | ICD-10-CM | POA: Insufficient documentation

## 2010-04-19 DIAGNOSIS — F329 Major depressive disorder, single episode, unspecified: Secondary | ICD-10-CM | POA: Insufficient documentation

## 2010-04-19 DIAGNOSIS — M545 Low back pain, unspecified: Secondary | ICD-10-CM | POA: Insufficient documentation

## 2010-04-19 DIAGNOSIS — R51 Headache: Secondary | ICD-10-CM | POA: Insufficient documentation

## 2010-04-19 DIAGNOSIS — Z79899 Other long term (current) drug therapy: Secondary | ICD-10-CM | POA: Insufficient documentation

## 2010-04-19 DIAGNOSIS — R6883 Chills (without fever): Secondary | ICD-10-CM | POA: Insufficient documentation

## 2010-04-19 DIAGNOSIS — E119 Type 2 diabetes mellitus without complications: Secondary | ICD-10-CM | POA: Insufficient documentation

## 2010-04-19 DIAGNOSIS — F3289 Other specified depressive episodes: Secondary | ICD-10-CM | POA: Insufficient documentation

## 2010-04-19 DIAGNOSIS — M546 Pain in thoracic spine: Secondary | ICD-10-CM | POA: Insufficient documentation

## 2010-04-19 DIAGNOSIS — G8929 Other chronic pain: Secondary | ICD-10-CM | POA: Insufficient documentation

## 2010-05-03 LAB — GLUCOSE, CAPILLARY: Glucose-Capillary: 114 mg/dL — ABNORMAL HIGH (ref 70–99)

## 2010-05-03 LAB — BASIC METABOLIC PANEL
BUN: 14 mg/dL (ref 6–23)
CO2: 21 mEq/L (ref 19–32)
Calcium: 9.6 mg/dL (ref 8.4–10.5)
Chloride: 100 mEq/L (ref 96–112)
Creatinine, Ser: 1.35 mg/dL — ABNORMAL HIGH (ref 0.4–1.2)
GFR calc Af Amer: 50 mL/min — ABNORMAL LOW (ref >60)
GFR calc non Af Amer: 41 mL/min — ABNORMAL LOW (ref >60)
Glucose, Bld: 100 mg/dL — ABNORMAL HIGH (ref 70–99)
Potassium: 4.6 mEq/L (ref 3.5–5.1)
Sodium: 133 mEq/L — ABNORMAL LOW (ref 135–145)

## 2010-05-03 LAB — ETHANOL: Alcohol, Ethyl (B): <5 mg/dL (ref 0–10)

## 2010-05-06 LAB — URINE MICROSCOPIC-ADD ON

## 2010-05-06 LAB — GLUCOSE, CAPILLARY
Glucose-Capillary: 101 mg/dL — ABNORMAL HIGH (ref 70–99)
Glucose-Capillary: 105 mg/dL — ABNORMAL HIGH (ref 70–99)
Glucose-Capillary: 108 mg/dL — ABNORMAL HIGH (ref 70–99)
Glucose-Capillary: 110 mg/dL — ABNORMAL HIGH (ref 70–99)
Glucose-Capillary: 111 mg/dL — ABNORMAL HIGH (ref 70–99)
Glucose-Capillary: 113 mg/dL — ABNORMAL HIGH (ref 70–99)
Glucose-Capillary: 118 mg/dL — ABNORMAL HIGH (ref 70–99)
Glucose-Capillary: 121 mg/dL — ABNORMAL HIGH (ref 70–99)
Glucose-Capillary: 69 mg/dL — ABNORMAL LOW (ref 70–99)
Glucose-Capillary: 72 mg/dL (ref 70–99)
Glucose-Capillary: 92 mg/dL (ref 70–99)
Glucose-Capillary: 97 mg/dL (ref 70–99)
Glucose-Capillary: 97 mg/dL (ref 70–99)
Glucose-Capillary: 98 mg/dL (ref 70–99)
Glucose-Capillary: 99 mg/dL (ref 70–99)
Glucose-Capillary: 104 mg/dL — ABNORMAL HIGH (ref 70–99)
Glucose-Capillary: 96 mg/dL (ref 70–99)
Glucose-Capillary: 129 mg/dL — ABNORMAL HIGH (ref 70–99)

## 2010-05-06 LAB — BASIC METABOLIC PANEL
BUN: 9 mg/dL (ref 6–23)
CO2: 20 mEq/L (ref 19–32)
Calcium: 9.5 mg/dL (ref 8.4–10.5)
Chloride: 107 mEq/L (ref 96–112)
Creatinine, Ser: 0.75 mg/dL (ref 0.4–1.2)
GFR calc Af Amer: >60 mL/min (ref >60)
GFR calc non Af Amer: >60 mL/min (ref >60)
Glucose, Bld: 90 mg/dL (ref 70–99)
Potassium: 3.9 mEq/L (ref 3.5–5.1)
Sodium: 137 mEq/L (ref 135–145)

## 2010-05-06 LAB — URINALYSIS, ROUTINE W REFLEX MICROSCOPIC
Bilirubin Urine: NEGATIVE
Glucose, UA: NEGATIVE mg/dL
Ketones, ur: NEGATIVE mg/dL
Leukocytes, UA: NEGATIVE
Nitrite: NEGATIVE
Protein, ur: NEGATIVE mg/dL
Specific Gravity, Urine: 1.02 (ref 1.005–1.030)
Urobilinogen, UA: 0.2 mg/dL (ref 0.0–1.0)
pH: 5.5 (ref 5.0–8.0)

## 2010-05-06 LAB — TRICYCLICS SCREEN, URINE: TCA Scrn: NOT DETECTED

## 2010-05-06 LAB — RAPID URINE DRUG SCREEN, HOSP PERFORMED
Amphetamines: NOT DETECTED
Barbiturates: NOT DETECTED
Benzodiazepines: POSITIVE — AB
Cocaine: NOT DETECTED
Opiates: POSITIVE — AB
Tetrahydrocannabinol: POSITIVE — AB

## 2010-05-06 LAB — DIFFERENTIAL
Basophils Absolute: 0 10*3/uL (ref 0.0–0.1)
Basophils Relative: 0 % (ref 0–1)
Eosinophils Absolute: 0 10*3/uL (ref 0.0–0.7)
Eosinophils Relative: 0 % (ref 0–5)
Lymphocytes Relative: 28 % (ref 12–46)
Lymphs Abs: 3.3 10*3/uL (ref 0.7–4.0)
Monocytes Absolute: 0.6 10*3/uL (ref 0.1–1.0)
Monocytes Relative: 5 % (ref 3–12)
Neutro Abs: 7.8 10*3/uL — ABNORMAL HIGH (ref 1.7–7.7)
Neutrophils Relative %: 67 % (ref 43–77)

## 2010-05-06 LAB — CBC
HCT: 36.9 % (ref 36.0–46.0)
Hemoglobin: 12.8 g/dL (ref 12.0–15.0)
MCH: 33.6 pg (ref 26.0–34.0)
MCHC: 34.7 g/dL (ref 30.0–36.0)
MCV: 97 fL (ref 78.0–100.0)
Platelets: 447 10*3/uL — ABNORMAL HIGH (ref 150–400)
RBC: 3.8 MIL/uL — ABNORMAL LOW (ref 3.87–5.11)
RDW: 15.1 % (ref 11.5–15.5)
WBC: 11.8 10*3/uL — ABNORMAL HIGH (ref 4.0–10.5)

## 2010-05-06 LAB — ETHANOL: Alcohol, Ethyl (B): <5 mg/dL (ref 0–10)

## 2010-05-09 LAB — GIARDIA/CRYPTOSPORIDIUM SCREEN(EIA)
Cryptosporidium Screen (EIA): NEGATIVE
Giardia Screen - EIA: NEGATIVE

## 2010-05-09 LAB — CLOSTRIDIUM DIFFICILE EIA: C difficile Toxins A+B, EIA: NEGATIVE

## 2010-05-09 LAB — STOOL CULTURE

## 2010-05-09 LAB — CBC
HCT: 40.1 % (ref 36.0–46.0)
Hemoglobin: 13.9 g/dL (ref 12.0–15.0)
MCHC: 34.7 g/dL (ref 30.0–36.0)
MCV: 92.1 fL (ref 78.0–100.0)
Platelets: 417 10*3/uL — ABNORMAL HIGH (ref 150–400)
RBC: 4.35 MIL/uL (ref 3.87–5.11)
RDW: 19.2 % — ABNORMAL HIGH (ref 11.5–15.5)
WBC: 16.9 10*3/uL — ABNORMAL HIGH (ref 4.0–10.5)

## 2010-05-09 LAB — DIFFERENTIAL
Basophils Absolute: 0 10*3/uL (ref 0.0–0.1)
Basophils Relative: 0 % (ref 0–1)
Eosinophils Absolute: 0.1 10*3/uL (ref 0.0–0.7)
Eosinophils Relative: 1 % (ref 0–5)
Lymphocytes Relative: 12 % (ref 12–46)
Lymphs Abs: 2 10*3/uL (ref 0.7–4.0)
Monocytes Absolute: 0.7 10*3/uL (ref 0.1–1.0)
Monocytes Relative: 4 % (ref 3–12)
Neutro Abs: 14 10*3/uL — ABNORMAL HIGH (ref 1.7–7.7)
Neutrophils Relative %: 83 % — ABNORMAL HIGH (ref 43–77)

## 2010-05-09 LAB — BASIC METABOLIC PANEL
BUN: 13 mg/dL (ref 6–23)
CO2: 26 mEq/L (ref 19–32)
Calcium: 9.3 mg/dL (ref 8.4–10.5)
Chloride: 104 mEq/L (ref 96–112)
Creatinine, Ser: 0.9 mg/dL (ref 0.4–1.2)
GFR calc Af Amer: >60 mL/min (ref >60)
GFR calc non Af Amer: >60 mL/min (ref >60)
Glucose, Bld: 122 mg/dL — ABNORMAL HIGH (ref 70–99)
Potassium: 3.5 mEq/L (ref 3.5–5.1)
Sodium: 141 mEq/L (ref 135–145)

## 2010-05-09 LAB — HEMOCCULT GUIAC POC 1CARD (OFFICE): Fecal Occult Bld: NEGATIVE

## 2010-05-11 LAB — URINALYSIS, ROUTINE W REFLEX MICROSCOPIC
Bilirubin Urine: NEGATIVE
Glucose, UA: NEGATIVE mg/dL
Hgb urine dipstick: NEGATIVE
Ketones, ur: NEGATIVE mg/dL
Nitrite: NEGATIVE
Protein, ur: NEGATIVE mg/dL
Specific Gravity, Urine: 1.015 (ref 1.005–1.030)
Urobilinogen, UA: 0.2 mg/dL (ref 0.0–1.0)
pH: 6 (ref 5.0–8.0)

## 2010-05-11 LAB — GLUCOSE, CAPILLARY
Glucose-Capillary: 128 mg/dL — ABNORMAL HIGH (ref 70–99)
Glucose-Capillary: 137 mg/dL — ABNORMAL HIGH (ref 70–99)
Glucose-Capillary: 139 mg/dL — ABNORMAL HIGH (ref 70–99)
Glucose-Capillary: 151 mg/dL — ABNORMAL HIGH (ref 70–99)

## 2010-05-12 ENCOUNTER — Emergency Department (HOSPITAL_COMMUNITY)
Admission: EM | Admit: 2010-05-12 | Discharge: 2010-05-12 | Disposition: A | Discharge status: Home / Self Care | Payer: Medicare Other | Attending: Emergency Medicine | Admitting: Emergency Medicine

## 2010-05-12 DIAGNOSIS — I10 Essential (primary) hypertension: Secondary | ICD-10-CM | POA: Insufficient documentation

## 2010-05-12 DIAGNOSIS — M545 Low back pain, unspecified: Secondary | ICD-10-CM | POA: Insufficient documentation

## 2010-05-12 DIAGNOSIS — E119 Type 2 diabetes mellitus without complications: Secondary | ICD-10-CM | POA: Insufficient documentation

## 2010-05-12 DIAGNOSIS — G8929 Other chronic pain: Secondary | ICD-10-CM | POA: Insufficient documentation

## 2010-05-12 LAB — BASIC METABOLIC PANEL
BUN: 7 mg/dL (ref 6–23)
CO2: 24 mEq/L (ref 19–32)
Calcium: 9.9 mg/dL (ref 8.4–10.5)
Chloride: 105 mEq/L (ref 96–112)
Creatinine, Ser: 0.69 mg/dL (ref 0.4–1.2)
GFR calc Af Amer: >60 mL/min (ref >60)
GFR calc non Af Amer: >60 mL/min (ref >60)
Glucose, Bld: 88 mg/dL (ref 70–99)
Potassium: 4.4 mEq/L (ref 3.5–5.1)
Sodium: 138 mEq/L (ref 135–145)

## 2010-05-12 LAB — ABO/RH: ABO/RH(D): A POS

## 2010-05-12 LAB — SURGICAL PCR SCREEN
MRSA, PCR: NEGATIVE
Staphylococcus aureus: NEGATIVE

## 2010-05-12 LAB — CBC
HCT: 37.3 % (ref 36.0–46.0)
Hemoglobin: 13 g/dL (ref 12.0–15.0)
MCHC: 34.8 g/dL (ref 30.0–36.0)
MCV: 96.7 fL (ref 78.0–100.0)
Platelets: 413 10*3/uL — ABNORMAL HIGH (ref 150–400)
RBC: 3.86 MIL/uL — ABNORMAL LOW (ref 3.87–5.11)
RDW: 14.3 % (ref 11.5–15.5)
WBC: 8.4 10*3/uL (ref 4.0–10.5)

## 2010-05-12 LAB — TYPE AND SCREEN
ABO/RH(D): A POS
Antibody Screen: NEGATIVE

## 2010-05-19 NOTE — Discharge Summary (Signed)
  Madeline Dickerson, Madeline Dickerson                 ACCOUNT NO.:  [id number]  MEDICAL RECORD NO.:  [id number]           PATIENT TYPE:  I  LOCATION:  3535                         FACILITY:  [facility]  PHYSICIAN:  Coletta Memos, M.D.     DATE OF BIRTH:  07-28-1960  DATE OF ADMISSION:  04/09/2010 DATE OF DISCHARGE:  04/09/2010                              DISCHARGE SUMMARY   ADMITTING DIAGNOSIS:  Chronic pain.  DISCHARGE DIAGNOSIS:  Chronic pain.  PROCEDURE:  Placement of spinal cord stimulator.  Discharged home with oxycodone IR 7.5 mg, Valium 5 mg, Relafen, Prevacid, iron, metformin, Diovan, fluoxetine, and Percocet 10/325.  [name] had a T10 laminectomy for placement of spinal cord stimulator located at T8 to T7.  No complications.  Wound clean, dry, and no signs of discharge.  At her discharge, she is ambulating, voiding, and tolerating a regular diet.  DISCHARGE DESTINATION:  Home.  STATUS:  Alive and well.  She will have return appointment to see me and the [employer] representative for programming.          ______________________________ Coletta Memos, M.D.     KC/MEDQ  D:  05/13/2010  T:  05/14/2010  Job:  [id number]  Electronically Signed by Coletta Memos M.D. on 05/19/2010 03:44:28 PM

## 2010-05-24 LAB — URINALYSIS, ROUTINE W REFLEX MICROSCOPIC
Bilirubin Urine: NEGATIVE
Glucose, UA: NEGATIVE mg/dL
Hgb urine dipstick: NEGATIVE
Ketones, ur: NEGATIVE mg/dL
Nitrite: NEGATIVE
Protein, ur: NEGATIVE mg/dL
Specific Gravity, Urine: 1.011 (ref 1.005–1.030)
Urobilinogen, UA: 0.2 mg/dL (ref 0.0–1.0)
pH: 6 (ref 5.0–8.0)

## 2010-05-26 LAB — CBC
HCT: 33.8 % — ABNORMAL LOW (ref 36.0–46.0)
Hemoglobin: 11.1 g/dL — ABNORMAL LOW (ref 12.0–15.0)
MCHC: 32.8 g/dL (ref 30.0–36.0)
MCV: 86.6 fL (ref 78.0–100.0)
Platelets: 391 10*3/uL (ref 150–400)
RBC: 3.91 MIL/uL (ref 3.87–5.11)
RDW: 21.1 % — ABNORMAL HIGH (ref 11.5–15.5)
WBC: 7.8 10*3/uL (ref 4.0–10.5)

## 2010-05-26 LAB — DIFFERENTIAL
Basophils Absolute: 0.1 10*3/uL (ref 0.0–0.1)
Basophils Relative: 1 % (ref 0–1)
Eosinophils Absolute: 0.2 10*3/uL (ref 0.0–0.7)
Eosinophils Relative: 3 % (ref 0–5)
Lymphocytes Relative: 34 % (ref 12–46)
Lymphs Abs: 2.6 10*3/uL (ref 0.7–4.0)
Monocytes Absolute: 0.6 10*3/uL (ref 0.1–1.0)
Monocytes Relative: 8 % (ref 3–12)
Neutro Abs: 4.3 10*3/uL (ref 1.7–7.7)
Neutrophils Relative %: 55 % (ref 43–77)

## 2010-05-26 LAB — URINE MICROSCOPIC-ADD ON

## 2010-05-26 LAB — WET PREP, GENITAL
Trich, Wet Prep: NONE SEEN
Yeast Wet Prep HPF POC: NONE SEEN

## 2010-05-26 LAB — URINALYSIS, ROUTINE W REFLEX MICROSCOPIC
Bilirubin Urine: NEGATIVE
Glucose, UA: NEGATIVE mg/dL
Ketones, ur: NEGATIVE mg/dL
Leukocytes, UA: NEGATIVE
Nitrite: NEGATIVE
Protein, ur: NEGATIVE mg/dL
Specific Gravity, Urine: <1.005 — ABNORMAL LOW (ref 1.005–1.030)
Urobilinogen, UA: 0.2 mg/dL (ref 0.0–1.0)
pH: 5.5 (ref 5.0–8.0)

## 2010-05-26 LAB — POCT PREGNANCY, URINE: Preg Test, Ur: NEGATIVE

## 2010-05-26 LAB — GC/CHLAMYDIA PROBE AMP, GENITAL
Chlamydia, DNA Probe: NEGATIVE
GC Probe Amp, Genital: NEGATIVE

## 2010-05-28 LAB — DIFFERENTIAL
Basophils Absolute: 0.1 10*3/uL (ref 0.0–0.1)
Basophils Relative: 1 % (ref 0–1)
Eosinophils Absolute: 0.2 10*3/uL (ref 0.0–0.7)
Eosinophils Relative: 2 % (ref 0–5)
Lymphocytes Relative: 25 % (ref 12–46)
Lymphs Abs: 2.1 10*3/uL (ref 0.7–4.0)
Monocytes Absolute: 0.4 10*3/uL (ref 0.1–1.0)
Monocytes Relative: 5 % (ref 3–12)
Neutro Abs: 5.5 10*3/uL (ref 1.7–7.7)
Neutrophils Relative %: 67 % (ref 43–77)

## 2010-05-28 LAB — COMPREHENSIVE METABOLIC PANEL
ALT: 12 U/L (ref 0–35)
AST: 20 U/L (ref 0–37)
Albumin: 3.4 g/dL — ABNORMAL LOW (ref 3.5–5.2)
Alkaline Phosphatase: 60 U/L (ref 39–117)
BUN: 7 mg/dL (ref 6–23)
CO2: 16 mEq/L — ABNORMAL LOW (ref 19–32)
Calcium: 8.7 mg/dL (ref 8.4–10.5)
Chloride: 111 mEq/L (ref 96–112)
Creatinine, Ser: 0.58 mg/dL (ref 0.4–1.2)
GFR calc Af Amer: >60 mL/min (ref >60)
GFR calc non Af Amer: >60 mL/min (ref >60)
Glucose, Bld: 118 mg/dL — ABNORMAL HIGH (ref 70–99)
Potassium: 4.1 mEq/L (ref 3.5–5.1)
Sodium: 137 mEq/L (ref 135–145)
Total Bilirubin: 0.4 mg/dL (ref 0.3–1.2)
Total Protein: 6.9 g/dL (ref 6.0–8.3)

## 2010-05-28 LAB — CBC
HCT: 31.8 % — ABNORMAL LOW (ref 36.0–46.0)
Hemoglobin: 10.6 g/dL — ABNORMAL LOW (ref 12.0–15.0)
MCHC: 33.2 g/dL (ref 30.0–36.0)
MCV: 85.5 fL (ref 78.0–100.0)
Platelets: 395 10*3/uL (ref 150–400)
RBC: 3.72 MIL/uL — ABNORMAL LOW (ref 3.87–5.11)
RDW: 16.5 % — ABNORMAL HIGH (ref 11.5–15.5)
WBC: 8.3 10*3/uL (ref 4.0–10.5)

## 2010-05-28 LAB — POCT CARDIAC MARKERS
CKMB, poc: <1 ng/mL — ABNORMAL LOW (ref 1.0–8.0)
Myoglobin, poc: 51.9 ng/mL (ref 12–200)
Troponin i, poc: <0.05 ng/mL (ref 0.00–0.09)

## 2010-05-28 LAB — LIPASE, BLOOD: Lipase: 19 U/L (ref 11–59)

## 2010-05-29 LAB — URINALYSIS, ROUTINE W REFLEX MICROSCOPIC
Bilirubin Urine: NEGATIVE
Glucose, UA: NEGATIVE mg/dL
Hgb urine dipstick: NEGATIVE
Ketones, ur: NEGATIVE mg/dL
Nitrite: NEGATIVE
Protein, ur: NEGATIVE mg/dL
Specific Gravity, Urine: 1.002 — ABNORMAL LOW (ref 1.005–1.030)
Urobilinogen, UA: 0.2 mg/dL (ref 0.0–1.0)
pH: 6.5 (ref 5.0–8.0)

## 2010-05-29 LAB — DIFFERENTIAL
Basophils Absolute: 0.1 10*3/uL (ref 0.0–0.1)
Basophils Relative: 1 % (ref 0–1)
Eosinophils Absolute: 0.2 10*3/uL (ref 0.0–0.7)
Eosinophils Relative: 3 % (ref 0–5)
Lymphocytes Relative: 37 % (ref 12–46)
Lymphs Abs: 3.4 10*3/uL (ref 0.7–4.0)
Monocytes Absolute: 0.7 10*3/uL (ref 0.1–1.0)
Monocytes Relative: 8 % (ref 3–12)
Neutro Abs: 4.8 10*3/uL (ref 1.7–7.7)
Neutrophils Relative %: 52 % (ref 43–77)

## 2010-05-29 LAB — COMPREHENSIVE METABOLIC PANEL
ALT: 11 U/L (ref 0–35)
AST: 19 U/L (ref 0–37)
Albumin: 3.2 g/dL — ABNORMAL LOW (ref 3.5–5.2)
Alkaline Phosphatase: 54 U/L (ref 39–117)
BUN: 7 mg/dL (ref 6–23)
CO2: 22 mEq/L (ref 19–32)
Calcium: 8.8 mg/dL (ref 8.4–10.5)
Chloride: 112 mEq/L (ref 96–112)
Creatinine, Ser: 0.51 mg/dL (ref 0.4–1.2)
GFR calc Af Amer: >60 mL/min (ref >60)
GFR calc non Af Amer: >60 mL/min (ref >60)
Glucose, Bld: 84 mg/dL (ref 70–99)
Potassium: 4.2 mEq/L (ref 3.5–5.1)
Sodium: 140 mEq/L (ref 135–145)
Total Bilirubin: <0.1 mg/dL — ABNORMAL LOW (ref 0.3–1.2)
Total Protein: 6.6 g/dL (ref 6.0–8.3)

## 2010-05-29 LAB — CBC
HCT: 29.1 % — ABNORMAL LOW (ref 36.0–46.0)
Hemoglobin: 9.7 g/dL — ABNORMAL LOW (ref 12.0–15.0)
MCHC: 33.3 g/dL (ref 30.0–36.0)
MCV: 88.1 fL (ref 78.0–100.0)
Platelets: 384 10*3/uL (ref 150–400)
RBC: 3.31 MIL/uL — ABNORMAL LOW (ref 3.87–5.11)
RDW: 15.8 % — ABNORMAL HIGH (ref 11.5–15.5)
WBC: 9.3 10*3/uL (ref 4.0–10.5)

## 2010-05-29 LAB — LIPASE, BLOOD: Lipase: 18 U/L (ref 11–59)

## 2010-05-30 LAB — DIFFERENTIAL
Basophils Absolute: 0.1 K/uL (ref 0.0–0.1)
Basophils Absolute: 0.1 K/uL (ref 0.0–0.1)
Basophils Relative: 1 % (ref 0–1)
Basophils Relative: 1 % (ref 0–1)
Eosinophils Absolute: 0.2 K/uL (ref 0.0–0.7)
Eosinophils Absolute: 0.2 K/uL (ref 0.0–0.7)
Eosinophils Relative: 2 % (ref 0–5)
Eosinophils Relative: 3 % (ref 0–5)
Lymphocytes Relative: 29 % (ref 12–46)
Lymphocytes Relative: 35 % (ref 12–46)
Lymphs Abs: 2.6 K/uL (ref 0.7–4.0)
Lymphs Abs: 3 K/uL (ref 0.7–4.0)
Monocytes Absolute: 0.6 K/uL (ref 0.1–1.0)
Monocytes Absolute: 0.6 K/uL (ref 0.1–1.0)
Monocytes Relative: 6 % (ref 3–12)
Monocytes Relative: 6 % (ref 3–12)
Neutro Abs: 5.7 K/uL (ref 1.7–7.7)
Neutro Abs: 4.8 K/uL (ref 1.7–7.7)
Neutrophils Relative %: 62 % (ref 43–77)
Neutrophils Relative %: 56 % (ref 43–77)

## 2010-05-30 LAB — CARDIAC PANEL(CRET KIN+CKTOT+MB+TROPI)
CK, MB: 1.7 ng/mL (ref 0.3–4.0)
CK, MB: 1.7 ng/mL (ref 0.3–4.0)
CK, MB: 2 ng/mL (ref 0.3–4.0)
Relative Index: 1 (ref 0.0–2.5)
Relative Index: 1.3 (ref 0.0–2.5)
Relative Index: 1.3 (ref 0.0–2.5)
Total CK: 128 U/L (ref 7–177)
Total CK: 153 U/L (ref 7–177)
Total CK: 166 U/L (ref 7–177)
Troponin I: 0.09 ng/mL — ABNORMAL HIGH (ref 0.00–0.06)
Troponin I: 0.12 ng/mL — ABNORMAL HIGH (ref 0.00–0.06)
Troponin I: 0.23 ng/mL — ABNORMAL HIGH (ref 0.00–0.06)

## 2010-05-30 LAB — URINALYSIS, ROUTINE W REFLEX MICROSCOPIC
Bilirubin Urine: NEGATIVE
Bilirubin Urine: NEGATIVE
Glucose, UA: NEGATIVE mg/dL
Glucose, UA: NEGATIVE mg/dL
Hgb urine dipstick: NEGATIVE
Ketones, ur: NEGATIVE mg/dL
Ketones, ur: NEGATIVE mg/dL
Leukocytes, UA: NEGATIVE
Nitrite: NEGATIVE
Nitrite: NEGATIVE
Protein, ur: NEGATIVE mg/dL
Protein, ur: NEGATIVE mg/dL
Specific Gravity, Urine: 1.003 — ABNORMAL LOW (ref 1.005–1.030)
Specific Gravity, Urine: >1.046 — ABNORMAL HIGH (ref 1.005–1.030)
Urobilinogen, UA: 0.2 mg/dL (ref 0.0–1.0)
Urobilinogen, UA: 1 mg/dL (ref 0.0–1.0)
pH: 6 (ref 5.0–8.0)
pH: 6.5 (ref 5.0–8.0)

## 2010-05-30 LAB — CBC
HCT: 33.2 % — ABNORMAL LOW (ref 36.0–46.0)
HCT: 28.7 % — ABNORMAL LOW (ref 36.0–46.0)
HCT: 31.5 % — ABNORMAL LOW (ref 36.0–46.0)
HCT: 34 % — ABNORMAL LOW (ref 36.0–46.0)
Hemoglobin: 11.1 g/dL — ABNORMAL LOW (ref 12.0–15.0)
Hemoglobin: 10.6 g/dL — ABNORMAL LOW (ref 12.0–15.0)
Hemoglobin: 11.3 g/dL — ABNORMAL LOW (ref 12.0–15.0)
Hemoglobin: 9.8 g/dL — ABNORMAL LOW (ref 12.0–15.0)
MCHC: 33.6 g/dL (ref 30.0–36.0)
MCHC: 33.2 g/dL (ref 30.0–36.0)
MCHC: 33.6 g/dL (ref 30.0–36.0)
MCHC: 34.1 g/dL (ref 30.0–36.0)
MCV: 88.1 fL (ref 78.0–100.0)
MCV: 87.9 fL (ref 78.0–100.0)
MCV: 88.9 fL (ref 78.0–100.0)
MCV: 89.7 fL (ref 78.0–100.0)
Platelets: 436 10*3/uL — ABNORMAL HIGH (ref 150–400)
Platelets: 327 10*3/uL (ref 150–400)
Platelets: 387 K/uL (ref 150–400)
Platelets: 407 10*3/uL — ABNORMAL HIGH (ref 150–400)
RBC: 3.77 MIL/uL — ABNORMAL LOW (ref 3.87–5.11)
RBC: 3.23 MIL/uL — ABNORMAL LOW (ref 3.87–5.11)
RBC: 3.58 MIL/uL — ABNORMAL LOW (ref 3.87–5.11)
RBC: 3.79 MIL/uL — ABNORMAL LOW (ref 3.87–5.11)
RDW: 16.5 % — ABNORMAL HIGH (ref 11.5–15.5)
RDW: 16.7 % — ABNORMAL HIGH (ref 11.5–15.5)
RDW: 16.9 % — ABNORMAL HIGH (ref 11.5–15.5)
RDW: 17 % — ABNORMAL HIGH (ref 11.5–15.5)
WBC: 9.1 10*3/uL (ref 4.0–10.5)
WBC: 7.6 10*3/uL (ref 4.0–10.5)
WBC: 8.7 K/uL (ref 4.0–10.5)
WBC: 9.1 10*3/uL (ref 4.0–10.5)

## 2010-05-30 LAB — LIPID PANEL
Cholesterol: 167 mg/dL (ref 0–200)
HDL: 31 mg/dL — ABNORMAL LOW (ref >39)
LDL Cholesterol: 80 mg/dL (ref 0–99)
Total CHOL/HDL Ratio: 5.4 RATIO
Triglycerides: 280 mg/dL — ABNORMAL HIGH (ref <150)
VLDL: 56 mg/dL — ABNORMAL HIGH (ref 0–40)

## 2010-05-30 LAB — URINE CULTURE

## 2010-05-30 LAB — COMPREHENSIVE METABOLIC PANEL WITH GFR
ALT: 17 U/L (ref 0–35)
AST: 20 U/L (ref 0–37)
Albumin: 3.7 g/dL (ref 3.5–5.2)
Alkaline Phosphatase: 62 U/L (ref 39–117)
BUN: 5 mg/dL — ABNORMAL LOW (ref 6–23)
CO2: 23 meq/L (ref 19–32)
Calcium: 9.2 mg/dL (ref 8.4–10.5)
Chloride: 108 meq/L (ref 96–112)
Creatinine, Ser: 0.57 mg/dL (ref 0.4–1.2)
GFR calc non Af Amer: >60 mL/min
Glucose, Bld: 139 mg/dL — ABNORMAL HIGH (ref 70–99)
Potassium: 3.7 meq/L (ref 3.5–5.1)
Sodium: 138 meq/L (ref 135–145)
Total Bilirubin: 0.2 mg/dL — ABNORMAL LOW (ref 0.3–1.2)
Total Protein: 7.5 g/dL (ref 6.0–8.3)

## 2010-05-30 LAB — URINE MICROSCOPIC-ADD ON

## 2010-05-30 LAB — PROTIME-INR
INR: 1 (ref 0.00–1.49)
Prothrombin Time: 12.9 s (ref 11.6–15.2)

## 2010-05-30 LAB — POCT I-STAT, CHEM 8
BUN: 5 mg/dL — ABNORMAL LOW (ref 6–23)
BUN: 8 mg/dL (ref 6–23)
Calcium, Ion: 1.19 mmol/L (ref 1.12–1.32)
Calcium, Ion: 1.13 mmol/L (ref 1.12–1.32)
Chloride: 105 mEq/L (ref 96–112)
Chloride: 107 meq/L (ref 96–112)
Creatinine, Ser: 0.6 mg/dL (ref 0.4–1.2)
Creatinine, Ser: 0.6 mg/dL (ref 0.4–1.2)
Glucose, Bld: 134 mg/dL — ABNORMAL HIGH (ref 70–99)
Glucose, Bld: 92 mg/dL (ref 70–99)
HCT: 35 % — ABNORMAL LOW (ref 36.0–46.0)
HCT: 32 % — ABNORMAL LOW (ref 36.0–46.0)
Hemoglobin: 11.9 g/dL — ABNORMAL LOW (ref 12.0–15.0)
Hemoglobin: 10.9 g/dL — ABNORMAL LOW (ref 12.0–15.0)
Potassium: 3.7 mEq/L (ref 3.5–5.1)
Potassium: 3.7 meq/L (ref 3.5–5.1)
Sodium: 139 mEq/L (ref 135–145)
Sodium: 139 meq/L (ref 135–145)
TCO2: 22 mmol/L (ref 0–100)
TCO2: 21 mmol/L (ref 0–100)

## 2010-05-30 LAB — POCT CARDIAC MARKERS
CKMB, poc: <1 ng/mL — ABNORMAL LOW (ref 1.0–8.0)
CKMB, poc: <1 ng/mL — ABNORMAL LOW (ref 1.0–8.0)
Myoglobin, poc: 26.7 ng/mL (ref 12–200)
Myoglobin, poc: 35.5 ng/mL (ref 12–200)
Troponin i, poc: 0.12 ng/mL — ABNORMAL HIGH (ref 0.00–0.09)
Troponin i, poc: 0.16 ng/mL — ABNORMAL HIGH (ref 0.00–0.09)

## 2010-05-30 LAB — BASIC METABOLIC PANEL
BUN: 6 mg/dL (ref 6–23)
CO2: 24 mEq/L (ref 19–32)
Calcium: 8.6 mg/dL (ref 8.4–10.5)
Chloride: 112 mEq/L (ref 96–112)
Creatinine, Ser: 0.61 mg/dL (ref 0.4–1.2)
GFR calc Af Amer: >60 mL/min (ref >60)
GFR calc non Af Amer: >60 mL/min (ref >60)
Glucose, Bld: 125 mg/dL — ABNORMAL HIGH (ref 70–99)
Potassium: 3.9 mEq/L (ref 3.5–5.1)
Sodium: 141 mEq/L (ref 135–145)

## 2010-05-30 LAB — LIPASE, BLOOD: Lipase: 24 U/L (ref 11–59)

## 2010-05-30 LAB — POCT PREGNANCY, URINE: Preg Test, Ur: NEGATIVE

## 2010-05-30 LAB — HEMOCCULT GUIAC POC 1CARD (OFFICE): Fecal Occult Bld: POSITIVE

## 2010-05-30 LAB — HEPARIN LEVEL (UNFRACTIONATED): Heparin Unfractionated: 0.29 IU/mL — ABNORMAL LOW (ref 0.30–0.70)

## 2010-05-30 LAB — TSH: TSH: 2.455 u[IU]/mL (ref 0.350–4.500)

## 2010-06-01 LAB — URINALYSIS, ROUTINE W REFLEX MICROSCOPIC
Bilirubin Urine: NEGATIVE
Bilirubin Urine: NEGATIVE
Glucose, UA: NEGATIVE mg/dL
Glucose, UA: NEGATIVE mg/dL
Hgb urine dipstick: NEGATIVE
Hgb urine dipstick: NEGATIVE
Ketones, ur: NEGATIVE mg/dL
Ketones, ur: NEGATIVE mg/dL
Nitrite: NEGATIVE
Nitrite: NEGATIVE
Protein, ur: NEGATIVE mg/dL
Protein, ur: NEGATIVE mg/dL
Specific Gravity, Urine: 1.008 (ref 1.005–1.030)
Specific Gravity, Urine: 1.008 (ref 1.005–1.030)
Urobilinogen, UA: 0.2 mg/dL (ref 0.0–1.0)
Urobilinogen, UA: 0.2 mg/dL (ref 0.0–1.0)
pH: 6 (ref 5.0–8.0)
pH: 6 (ref 5.0–8.0)

## 2010-06-01 LAB — BASIC METABOLIC PANEL
BUN: 8 mg/dL (ref 6–23)
CO2: 27 mEq/L (ref 19–32)
Calcium: 10.4 mg/dL (ref 8.4–10.5)
Chloride: 106 mEq/L (ref 96–112)
Creatinine, Ser: 0.64 mg/dL (ref 0.4–1.2)
GFR calc Af Amer: >60 mL/min (ref >60)
GFR calc non Af Amer: >60 mL/min (ref >60)
Glucose, Bld: 85 mg/dL (ref 70–99)
Potassium: 3.7 mEq/L (ref 3.5–5.1)
Sodium: 142 mEq/L (ref 135–145)

## 2010-06-01 LAB — DIFFERENTIAL
Basophils Absolute: 0 10*3/uL (ref 0.0–0.1)
Basophils Absolute: 0.1 10*3/uL (ref 0.0–0.1)
Basophils Relative: 1 % (ref 0–1)
Basophils Relative: 1 % (ref 0–1)
Eosinophils Absolute: 0.3 10*3/uL (ref 0.0–0.7)
Eosinophils Absolute: 0.2 10*3/uL (ref 0.0–0.7)
Eosinophils Relative: 4 % (ref 0–5)
Eosinophils Relative: 2 % (ref 0–5)
Lymphocytes Relative: 26 % (ref 12–46)
Lymphocytes Relative: 28 % (ref 12–46)
Lymphs Abs: 1.9 10*3/uL (ref 0.7–4.0)
Lymphs Abs: 2.8 10*3/uL (ref 0.7–4.0)
Monocytes Absolute: 0.5 10*3/uL (ref 0.1–1.0)
Monocytes Absolute: 0.7 10*3/uL (ref 0.1–1.0)
Monocytes Relative: 6 % (ref 3–12)
Monocytes Relative: 7 % (ref 3–12)
Neutro Abs: 4.7 10*3/uL (ref 1.7–7.7)
Neutro Abs: 6.4 10*3/uL (ref 1.7–7.7)
Neutrophils Relative %: 63 % (ref 43–77)
Neutrophils Relative %: 63 % (ref 43–77)

## 2010-06-01 LAB — PREGNANCY, URINE: Preg Test, Ur: NEGATIVE

## 2010-06-01 LAB — COMPREHENSIVE METABOLIC PANEL
ALT: 17 U/L (ref 0–35)
AST: 21 U/L (ref 0–37)
Albumin: 3.5 g/dL (ref 3.5–5.2)
Alkaline Phosphatase: 59 U/L (ref 39–117)
BUN: 9 mg/dL (ref 6–23)
CO2: 22 mEq/L (ref 19–32)
Calcium: 9 mg/dL (ref 8.4–10.5)
Chloride: 109 mEq/L (ref 96–112)
Creatinine, Ser: 0.55 mg/dL (ref 0.4–1.2)
GFR calc Af Amer: >60 mL/min (ref >60)
GFR calc non Af Amer: >60 mL/min (ref >60)
Glucose, Bld: 120 mg/dL — ABNORMAL HIGH (ref 70–99)
Potassium: 4.2 mEq/L (ref 3.5–5.1)
Sodium: 139 mEq/L (ref 135–145)
Total Bilirubin: 0.2 mg/dL — ABNORMAL LOW (ref 0.3–1.2)
Total Protein: 6.7 g/dL (ref 6.0–8.3)

## 2010-06-01 LAB — HEMOCCULT GUIAC POC 1CARD (OFFICE): Fecal Occult Bld: NEGATIVE

## 2010-06-01 LAB — CBC
HCT: 34.2 % — ABNORMAL LOW (ref 36.0–46.0)
HCT: 32.9 % — ABNORMAL LOW (ref 36.0–46.0)
Hemoglobin: 11.6 g/dL — ABNORMAL LOW (ref 12.0–15.0)
Hemoglobin: 10.9 g/dL — ABNORMAL LOW (ref 12.0–15.0)
MCHC: 33.8 g/dL (ref 30.0–36.0)
MCHC: 33.2 g/dL (ref 30.0–36.0)
MCV: 87.8 fL (ref 78.0–100.0)
MCV: 88.5 fL (ref 78.0–100.0)
Platelets: 352 10*3/uL (ref 150–400)
Platelets: 346 10*3/uL (ref 150–400)
RBC: 3.9 MIL/uL (ref 3.87–5.11)
RBC: 3.72 MIL/uL — ABNORMAL LOW (ref 3.87–5.11)
RDW: 16.6 % — ABNORMAL HIGH (ref 11.5–15.5)
RDW: 17 % — ABNORMAL HIGH (ref 11.5–15.5)
WBC: 7.4 10*3/uL (ref 4.0–10.5)
WBC: 10.1 10*3/uL (ref 4.0–10.5)

## 2010-06-01 LAB — LIPASE, BLOOD
Lipase: 18 U/L (ref 11–59)
Lipase: 24 U/L (ref 11–59)

## 2010-06-01 LAB — HEPATIC FUNCTION PANEL
ALT: 12 U/L (ref 0–35)
AST: 19 U/L (ref 0–37)
Albumin: 3.4 g/dL — ABNORMAL LOW (ref 3.5–5.2)
Alkaline Phosphatase: 54 U/L (ref 39–117)
Bilirubin, Direct: <0.1 mg/dL (ref 0.0–0.3)
Total Bilirubin: 0.2 mg/dL — ABNORMAL LOW (ref 0.3–1.2)
Total Protein: 6.6 g/dL (ref 6.0–8.3)

## 2010-06-01 LAB — POCT PREGNANCY, URINE: Preg Test, Ur: NEGATIVE

## 2010-06-02 LAB — POCT I-STAT, CHEM 8
BUN: 9 mg/dL (ref 6–23)
Calcium, Ion: 1.1 mmol/L — ABNORMAL LOW (ref 1.12–1.32)
Chloride: 109 mEq/L (ref 96–112)
Creatinine, Ser: 0.8 mg/dL (ref 0.4–1.2)
Glucose, Bld: 86 mg/dL (ref 70–99)
HCT: 38 % (ref 36.0–46.0)
Hemoglobin: 12.9 g/dL (ref 12.0–15.0)
Potassium: 4 mEq/L (ref 3.5–5.1)
Sodium: 139 mEq/L (ref 135–145)
TCO2: 22 mmol/L (ref 0–100)

## 2010-06-02 LAB — DIFFERENTIAL
Basophils Absolute: 0 10*3/uL (ref 0.0–0.1)
Basophils Relative: 1 % (ref 0–1)
Eosinophils Absolute: 0.1 10*3/uL (ref 0.0–0.7)
Eosinophils Relative: 1 % (ref 0–5)
Lymphocytes Relative: 38 % (ref 12–46)
Lymphs Abs: 2.8 10*3/uL (ref 0.7–4.0)
Monocytes Absolute: 0.4 10*3/uL (ref 0.1–1.0)
Monocytes Relative: 6 % (ref 3–12)
Neutro Abs: 4.1 10*3/uL (ref 1.7–7.7)
Neutrophils Relative %: 55 % (ref 43–77)

## 2010-06-02 LAB — URINALYSIS, ROUTINE W REFLEX MICROSCOPIC
Bilirubin Urine: NEGATIVE
Bilirubin Urine: NEGATIVE
Glucose, UA: NEGATIVE mg/dL
Glucose, UA: NEGATIVE mg/dL
Hgb urine dipstick: NEGATIVE
Hgb urine dipstick: NEGATIVE
Ketones, ur: NEGATIVE mg/dL
Ketones, ur: NEGATIVE mg/dL
Nitrite: NEGATIVE
Nitrite: NEGATIVE
Protein, ur: NEGATIVE mg/dL
Protein, ur: NEGATIVE mg/dL
Specific Gravity, Urine: 1.021 (ref 1.005–1.030)
Specific Gravity, Urine: 1.004 — ABNORMAL LOW (ref 1.005–1.030)
Urobilinogen, UA: 1 mg/dL (ref 0.0–1.0)
Urobilinogen, UA: 0.2 mg/dL (ref 0.0–1.0)
pH: 6.5 (ref 5.0–8.0)
pH: 6 (ref 5.0–8.0)

## 2010-06-02 LAB — URINE CULTURE: Colony Count: 10000

## 2010-06-02 LAB — CBC
HCT: 35.9 % — ABNORMAL LOW (ref 36.0–46.0)
Hemoglobin: 12.1 g/dL (ref 12.0–15.0)
MCHC: 33.8 g/dL (ref 30.0–36.0)
MCV: 88.2 fL (ref 78.0–100.0)
Platelets: 382 10*3/uL (ref 150–400)
RBC: 4.07 MIL/uL (ref 3.87–5.11)
RDW: 16 % — ABNORMAL HIGH (ref 11.5–15.5)
WBC: 7.4 10*3/uL (ref 4.0–10.5)

## 2010-06-02 LAB — PREGNANCY, URINE: Preg Test, Ur: NEGATIVE

## 2010-06-02 LAB — URINE MICROSCOPIC-ADD ON

## 2010-06-07 LAB — URINALYSIS, ROUTINE W REFLEX MICROSCOPIC
Glucose, UA: NEGATIVE mg/dL
Hgb urine dipstick: NEGATIVE
Ketones, ur: NEGATIVE mg/dL
Nitrite: NEGATIVE
Protein, ur: NEGATIVE mg/dL
Specific Gravity, Urine: 1.012 (ref 1.005–1.030)
Urobilinogen, UA: 1 mg/dL (ref 0.0–1.0)
pH: 6.5 (ref 5.0–8.0)

## 2010-07-06 NOTE — H&P (Signed)
NAMEDAVIE, SAGONA                 ACCOUNT NO.:  [id number]   MEDICAL RECORD NO.:  [id number]          PATIENT TYPE:  INP   LOCATION:  1443                         FACILITY:  [facility]   PHYSICIAN:  Rollene Rotunda, MD, FACCDATE OF BIRTH:  Jun 07, 1960   DATE OF ADMISSION:  08/23/2008  DATE OF DISCHARGE:                              HISTORY & PHYSICAL   PRIMARY CARE PHYSICIAN:  None.   CARDIOLOGIST:  None.   REASON FOR PRESENTATION:  Evaluate the patient with an elevated cardiac  enzyme.   HISTORY OF PRESENT ILLNESS:  The patient is a 50 year old white female  without prior cardiac history.  She is a long history of back pain and  has multiple ER visits for management of this.  She has not had any  cardiac history.  However, she has never had a cardiac workup.  She  presented to the emergency room with back and leg pain similar to her  chronic problems.  However, she did mention some of her reflux symptoms.  This has been longstanding problem as well.  She feels a burning in the  back of her throat.  She ate late at [employer].  She knows that this  will happen when she does that.  This is her usual discomfort.  He has  been no radiation to her jaw or to her arms.  She does get nauseated  with this but this is a typical symptom.  There has been no excessive  diaphoresis.  She has not had any new shortness of breath.  Denies any  PND or orthopnea.  She has had no palpitations, presyncope or syncope.  However, in the emergency room she did have cardiac enzymes cycled.  CK-  MB was negative.  However, the troponin has been slightly elevated at  0.16.  EKG is normal.  She is not complaining currently of any chest  discomfort.   PAST MEDICAL HISTORY:  1. Borderline hyperlipidemia.  2. Borderline hypertension.  3. Tobacco abuse.  4. Anxiety.  5. Depression.   PAST SURGICAL HISTORY:  1. Back surgery x3.  2. C-section.   ALLERGIES:  HYDROCODONE.   MEDICATIONS:  Klonopin, Prozac,  ibuprofen, Aleve.   SOCIAL HISTORY:  The patient smokes one pack per day for 35 years.  She  is separated.  She has two children.  She is disabled and trying to get  disability.   FAMILY HISTORY:  Remarkable for father having a stroke in his 10s, but  currently being alive and well.   REVIEW OF SYSTEMS:  As stated in the HPI, otherwise negative for all  other systems except for possible restless leg syndrome.   PHYSICAL EXAMINATION:  GENERAL:  She is in pain with her back.  She is  pleasant.  VITAL SIGNS:  Blood pressure 142/63, heart 86 and regular, respiratory  rate 18, afebrile.  HEENT:  Eyelids are unremarkable.  Pupils equal, round and reactive to  light.  Fundi not visualized.  Oral mucosa normal.  NECK:  No jugular distention at 45 degrees.  Carotid upstroke brisk and  symmetric.  No bruits,  thyromegaly.  LYMPHATICS:  No cervical, axillary, inguinal adenopathy.  LUNGS:  Clear to auscultation bilaterally.  BACK:  No costovertebral tenderness.  CHEST:  Unremarkable.  HEART:  PMI not displaced or sustained.  S1-S2 within normal limits.  No  S3, no S4.  No clicks, rubs or murmurs.  ABDOMEN:  Flat, positive bowel sounds.  Normal in frequency and pitch,  no bruits, rebound or guarding or midline pulsatile mass.  No  hepatomegaly, splenomegaly.  SKIN:  No rashes, no nodules.  EXTREMITIES:  Pulses 2+ throughout.  No edema, cyanosis or clubbing.  NEUROLOGICAL:  Oriented to place, time.  Cranial nerves II-XII grossly  intact.  Motor grossly intact.   LABORATORY DATA:  Pregnancy test negative.  Urinalysis unremarkable.  Point care markers:  Troponin 0.12, MB negative, myoglobin negative.  CBC:  Hemoglobin 10.6, platelets 397, WBC 8.7, potassium 3.7.  Sodium  139, BUN 8, creatinine 0.8, guaiac-positive.  Lumbar spine degenerative  changes.  No acute abnormality.  Chest x-ray stable.   EKG:  Sinus rhythm, rate 64, leftward axis, intervals within normal  limits, no acute ST-T wave  changes.   ASSESSMENT/PLAN:  1. Chest.  The patient has chest comfort is consistent with reflux.      She has no typical anginal symptoms.  However, she has significant      cardiovascular risk factors.  She does have a slightly elevated      troponin.  At the least, this patient will need a stress perfusion      study.  She will not be able to walk, so this would need to be an      adenosine Cardiolite.  If, however, her enzymes trend upward or she      has more classic symptoms or EKG changes, she would need invasive      evaluation.  She needs aggressive risk reduction.  2. Tobacco.  She was educated.  Does not think she can quit smoking      with her level of stress.  3. Risk reduction.  Will check a lipid profile.  This should be      managed accordingly.  4. Hypertension.  Blood pressure is slightly elevated.  Will add beta      blockers for now.  She has a headache, so I will takeoff nitrates.      5.  Anxiety/depression.  She will continue her previous      medications.  5. Back pain.  I will give her one dose of Dilaudid IV and Toradol      which has helped in the past with her pain.  6. Anemia.  She is slightly guaiac positive.  Her hemoglobin is      slightly low, but has been the last two times in the emergency room      and off and on in the past.  This can be evaluated as an outpatient      at [facility] where she has received care.  This should be      followed up at discharge.      Rollene Rotunda, MD, [facility]  Electronically Signed     JH/MEDQ  D:  08/23/2008  T:  08/24/2008  Job:  [id number]

## 2010-07-06 NOTE — Discharge Summary (Signed)
NAMECHASITTY, HEHL                 ACCOUNT NO.:  [id number]   MEDICAL RECORD NO.:  [id number]          PATIENT TYPE:  INP   LOCATION:  1443                         [facility]AC[facility]   PHYSICIAN:  Lyn Records, M.D.   DATE OF BIRTH:  Feb 22, 1960   DATE OF ADMISSION:  08/23/2008  DATE OF DISCHARGE:  08/25/2008                               DISCHARGE SUMMARY   REASON FOR ADMISSION:  Elevated troponin.   DISCHARGE DIAGNOSES:  1. Elevated troponin, resolved.  No evidence of myocardial infarction      or symptoms consistent with coronary disease.  The patient does      have risk factors, including smoking.  2. Severe lumbar disk disease with 3 previous operations and chronic      pain syndrome.  3. Smoking history.  4. Gastroesophageal reflux.   DISCHARGE PLAN:  1. Discharged to home with instructions to return if any chest pain or      symptoms suggestive of cardiopulmonary problems.  2. Proton pump inhibitor, Nexium 40 mg per day or equivalent is added.  3. Cigarette smoking cessation is recommended.  4. Aspirin 81 mg per day is added.   MEDICATIONS AT DISCHARGE:  Klonopin, Prozac, and ibuprofen as previously  taken.  Aspirin 325 mg per day, Nexium 40 mg per day, nitroglycerin 0.4  mg sublingually p.r.n. chest pain, and nicotine patch 14 mg daily to  skin for 2 weeks.   DIAGNOSTIC TESTING:  My office will call the patient on August 26, 2008, to  arrange a pharmacologic myocardial perfusion study to rule out  underlying coronary artery disease.   CONDITION ON DISCHARGE:  Improved.   HOSPITAL COURSE AND ADMITTING HISTORY:  The patient is a 50 years of age  and came to the emergency room on August 23, 2008, for evaluation and  management of chronic back pain, leg pain, nausea and indigestion.  During the course of the emergency room stay, her troponin I was  obtained that was elevated at 0.23.  There was no classical anginal  complaints.  She did complain of a burning/indigestion tight  feeling in  her chest that it is relieved by belching.  She has had this  intermittently over the years.  There was no shortness of breath or  exertional component.  Because of the elevated troponin.  She was  admitted to the hospital.   Upon clinical evaluation and followup in the hospital myocardial  infarction was excluded.  CPK-MB values were all negative.  Her EKG was  totally normal.  The troponin levels gradually drifted downward towards  normal on 3 determinations.  She had no recurrence of any chest  discomfort.  Her complaint during the hospitalization; however, was more  related to chronic back and leg discomfort from degenerative disk  disease.  Of note, there is a hemoglobin of 9.8.  The patient states  that this has been a chronic problem.  She does have a weakly positive  stool guaiac.   We attempted to have a nuclear study performed on August 25, 2008, but this  was not possible  because of the holiday.   The patient is discharged and we will follow up at [facility].  She has an appointment an after August 26, 2008.  Her evaluations should  include:  1. For the presence of anemia (the patient has heavy menstrual      bleeding and the low hemoglobin is likely secondary to that).  The      GI source of blood loss must be ruled out.  2. We will obtain a nuclear myocardial perfusion study to rule out      coronary disease.  We have encouraged her to stop smoking.      Lyn Records, M.D.  Electronically Signed     HWS/MEDQ  D:  08/25/2008  T:  08/25/2008  Job:  [id number]   cc:   [facility]
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
## 2010-07-09 NOTE — Op Note (Signed)
   NAMEREYNALDA, Madeline Dickerson                           ACCOUNT NO.:  [id number]   MEDICAL RECORD NO.:  [id number]                   PATIENT TYPE:  AMB   LOCATION:  XRAY                                 FACILITY:  [facility]   PHYSICIAN:  Ricky D. Gasper Sells, M.D.             DATE OF BIRTH:  1960/03/27   DATE OF PROCEDURE:  11/07/2001  DATE OF DISCHARGE:                                 OPERATIVE REPORT   PROCEDURE:  Left SI joint block using 2 cc of 0.5% bupivacaine and 1cc of  Depo-Medrol with 80 mg of methylprednisolone per cc.   SURGEON:  Ricky D. Gasper Sells, M.D.   COMPLICATIONS:  Nil.   ASSISTANT:  Nil.   BLOOD LOSS:  Nil.   DESCRIPTION OF PROCEDURE:  With the patient in the prone position, the  patient was prepped in the usual fashion for a left SI injection.   A 22 gauge needle was inserted down to the junction of the SI joint with the  pelvis.  Poking that joint recreated some of her discomfort.  The drug was  then injected, and 1 cc of [place] aspiration after each injection for a  total of 3 cc.  The needle was then withdrawn.  No problems occurred.  She  appreciated moderate relief of her symptomatology with this injection.                                               Ricky D. Gasper Sells, M.D.    RDH/MEDQ  D:  11/08/2001  T:  11/08/2001  Job:  [id number]

## 2010-08-06 ENCOUNTER — Emergency Department (HOSPITAL_COMMUNITY)
Admission: EM | Admit: 2010-08-06 | Discharge: 2010-08-06 | Discharge status: Against Medical Advice | Payer: Medicare Other | Attending: Emergency Medicine | Admitting: Emergency Medicine

## 2010-08-06 DIAGNOSIS — R61 Generalized hyperhidrosis: Secondary | ICD-10-CM | POA: Insufficient documentation

## 2010-08-06 DIAGNOSIS — R1110 Vomiting, unspecified: Secondary | ICD-10-CM | POA: Insufficient documentation

## 2010-08-06 DIAGNOSIS — R6883 Chills (without fever): Secondary | ICD-10-CM | POA: Insufficient documentation

## 2010-08-09 ENCOUNTER — Emergency Department (HOSPITAL_COMMUNITY)
Admission: EM | Admit: 2010-08-09 | Discharge: 2010-08-10 | Disposition: A | Discharge status: Home / Self Care | Payer: Medicare Other | Attending: Emergency Medicine | Admitting: Emergency Medicine

## 2010-08-09 DIAGNOSIS — M549 Dorsalgia, unspecified: Secondary | ICD-10-CM | POA: Insufficient documentation

## 2010-08-09 DIAGNOSIS — E119 Type 2 diabetes mellitus without complications: Secondary | ICD-10-CM | POA: Insufficient documentation

## 2010-08-09 DIAGNOSIS — R197 Diarrhea, unspecified: Secondary | ICD-10-CM | POA: Insufficient documentation

## 2010-08-09 DIAGNOSIS — F329 Major depressive disorder, single episode, unspecified: Secondary | ICD-10-CM | POA: Insufficient documentation

## 2010-08-09 DIAGNOSIS — F3289 Other specified depressive episodes: Secondary | ICD-10-CM | POA: Insufficient documentation

## 2010-08-09 DIAGNOSIS — I10 Essential (primary) hypertension: Secondary | ICD-10-CM | POA: Insufficient documentation

## 2010-08-09 DIAGNOSIS — R112 Nausea with vomiting, unspecified: Secondary | ICD-10-CM | POA: Insufficient documentation

## 2010-08-09 LAB — CBC
HCT: 39.4 % (ref 36.0–46.0)
Hemoglobin: 13.5 g/dL (ref 12.0–15.0)
MCH: 31.5 pg (ref 26.0–34.0)
MCHC: 34.3 g/dL (ref 30.0–36.0)
MCV: 91.8 fL (ref 78.0–100.0)
Platelets: 398 10*3/uL (ref 150–400)
RBC: 4.29 MIL/uL (ref 3.87–5.11)
RDW: 14.3 % (ref 11.5–15.5)
WBC: 8.8 10*3/uL (ref 4.0–10.5)

## 2010-08-09 LAB — DIFFERENTIAL
Basophils Absolute: 0.1 10*3/uL (ref 0.0–0.1)
Basophils Relative: 1 % (ref 0–1)
Eosinophils Absolute: 0.3 10*3/uL (ref 0.0–0.7)
Eosinophils Relative: 3 % (ref 0–5)
Lymphocytes Relative: 34 % (ref 12–46)
Lymphs Abs: 3 10*3/uL (ref 0.7–4.0)
Monocytes Absolute: 1 10*3/uL (ref 0.1–1.0)
Monocytes Relative: 11 % (ref 3–12)
Neutro Abs: 4.5 10*3/uL (ref 1.7–7.7)
Neutrophils Relative %: 52 % (ref 43–77)

## 2010-08-09 LAB — COMPREHENSIVE METABOLIC PANEL
ALT: 19 U/L (ref 0–35)
AST: 20 U/L (ref 0–37)
Albumin: 3.8 g/dL (ref 3.5–5.2)
Alkaline Phosphatase: 66 U/L (ref 39–117)
BUN: 14 mg/dL (ref 6–23)
CO2: 21 mEq/L (ref 19–32)
Calcium: 9 mg/dL (ref 8.4–10.5)
Chloride: 101 mEq/L (ref 96–112)
Creatinine, Ser: 1.21 mg/dL — ABNORMAL HIGH (ref 0.50–1.10)
GFR calc Af Amer: 57 mL/min — ABNORMAL LOW (ref >60)
GFR calc non Af Amer: 47 mL/min — ABNORMAL LOW (ref >60)
Glucose, Bld: 89 mg/dL (ref 70–99)
Potassium: 3.3 mEq/L — ABNORMAL LOW (ref 3.5–5.1)
Sodium: 133 mEq/L — ABNORMAL LOW (ref 135–145)
Total Bilirubin: 0.2 mg/dL — ABNORMAL LOW (ref 0.3–1.2)
Total Protein: 7 g/dL (ref 6.0–8.3)

## 2010-08-09 LAB — GLUCOSE, CAPILLARY: Glucose-Capillary: 111 mg/dL — ABNORMAL HIGH (ref 70–99)

## 2010-08-09 LAB — URINALYSIS, ROUTINE W REFLEX MICROSCOPIC
Bilirubin Urine: NEGATIVE
Glucose, UA: NEGATIVE mg/dL
Hgb urine dipstick: NEGATIVE
Ketones, ur: NEGATIVE mg/dL
Leukocytes, UA: NEGATIVE
Nitrite: NEGATIVE
Protein, ur: NEGATIVE mg/dL
Specific Gravity, Urine: 1.005 (ref 1.005–1.030)
Urobilinogen, UA: 0.2 mg/dL (ref 0.0–1.0)
pH: 5 (ref 5.0–8.0)

## 2010-08-09 LAB — LIPASE, BLOOD: Lipase: 24 U/L (ref 11–59)

## 2010-08-22 HISTORY — PX: SPINAL CORD STIMULATOR REMOVAL: SHX2423

## 2010-08-23 ENCOUNTER — Emergency Department (HOSPITAL_COMMUNITY): Payer: Medicare Other

## 2010-08-23 ENCOUNTER — Emergency Department (HOSPITAL_COMMUNITY)
Admission: EM | Admit: 2010-08-23 | Discharge: 2010-08-23 | Disposition: A | Discharge status: Home / Self Care | Payer: Medicare Other | Attending: Emergency Medicine | Admitting: Emergency Medicine

## 2010-08-23 DIAGNOSIS — R05 Cough: Secondary | ICD-10-CM | POA: Insufficient documentation

## 2010-08-23 DIAGNOSIS — J40 Bronchitis, not specified as acute or chronic: Secondary | ICD-10-CM | POA: Insufficient documentation

## 2010-08-23 DIAGNOSIS — F329 Major depressive disorder, single episode, unspecified: Secondary | ICD-10-CM | POA: Insufficient documentation

## 2010-08-23 DIAGNOSIS — E119 Type 2 diabetes mellitus without complications: Secondary | ICD-10-CM | POA: Insufficient documentation

## 2010-08-23 DIAGNOSIS — R0602 Shortness of breath: Secondary | ICD-10-CM | POA: Insufficient documentation

## 2010-08-23 DIAGNOSIS — R059 Cough, unspecified: Secondary | ICD-10-CM | POA: Insufficient documentation

## 2010-08-23 DIAGNOSIS — R110 Nausea: Secondary | ICD-10-CM | POA: Insufficient documentation

## 2010-08-23 DIAGNOSIS — J45909 Unspecified asthma, uncomplicated: Secondary | ICD-10-CM | POA: Insufficient documentation

## 2010-08-23 DIAGNOSIS — F3289 Other specified depressive episodes: Secondary | ICD-10-CM | POA: Insufficient documentation

## 2010-08-23 DIAGNOSIS — I10 Essential (primary) hypertension: Secondary | ICD-10-CM | POA: Insufficient documentation

## 2010-08-23 LAB — COMPREHENSIVE METABOLIC PANEL
ALT: 20 U/L (ref 0–35)
AST: 28 U/L (ref 0–37)
Albumin: 4.2 g/dL (ref 3.5–5.2)
Alkaline Phosphatase: 87 U/L (ref 39–117)
BUN: 7 mg/dL (ref 6–23)
CO2: 20 mEq/L (ref 19–32)
Calcium: 9.4 mg/dL (ref 8.4–10.5)
Chloride: 96 mEq/L (ref 96–112)
Creatinine, Ser: 0.73 mg/dL (ref 0.50–1.10)
GFR calc Af Amer: >60 mL/min (ref >60)
GFR calc non Af Amer: >60 mL/min (ref >60)
Glucose, Bld: 111 mg/dL — ABNORMAL HIGH (ref 70–99)
Potassium: 3 mEq/L — ABNORMAL LOW (ref 3.5–5.1)
Sodium: 132 mEq/L — ABNORMAL LOW (ref 135–145)
Total Bilirubin: 0.3 mg/dL (ref 0.3–1.2)
Total Protein: 7.9 g/dL (ref 6.0–8.3)

## 2010-08-23 LAB — DIFFERENTIAL
Basophils Absolute: 0 10*3/uL (ref 0.0–0.1)
Basophils Relative: 0 % (ref 0–1)
Eosinophils Absolute: 0.1 10*3/uL (ref 0.0–0.7)
Eosinophils Relative: 1 % (ref 0–5)
Lymphocytes Relative: 33 % (ref 12–46)
Lymphs Abs: 3.2 10*3/uL (ref 0.7–4.0)
Monocytes Absolute: 1 10*3/uL (ref 0.1–1.0)
Monocytes Relative: 10 % (ref 3–12)
Neutro Abs: 5.4 10*3/uL (ref 1.7–7.7)
Neutrophils Relative %: 55 % (ref 43–77)

## 2010-08-23 LAB — CBC
HCT: 40.5 % (ref 36.0–46.0)
Hemoglobin: 14 g/dL (ref 12.0–15.0)
MCH: 31 pg (ref 26.0–34.0)
MCHC: 34.6 g/dL (ref 30.0–36.0)
MCV: 89.8 fL (ref 78.0–100.0)
Platelets: 440 10*3/uL — ABNORMAL HIGH (ref 150–400)
RBC: 4.51 MIL/uL (ref 3.87–5.11)
RDW: 13.6 % (ref 11.5–15.5)
WBC: 9.8 10*3/uL (ref 4.0–10.5)

## 2010-08-23 LAB — LIPASE, BLOOD: Lipase: 21 U/L (ref 11–59)

## 2010-08-30 ENCOUNTER — Emergency Department (HOSPITAL_COMMUNITY): Payer: Medicare Other

## 2010-08-30 ENCOUNTER — Emergency Department (HOSPITAL_COMMUNITY)
Admission: EM | Admit: 2010-08-30 | Discharge: 2010-08-30 | Disposition: A | Discharge status: Home / Self Care | Payer: Medicare Other | Attending: Emergency Medicine | Admitting: Emergency Medicine

## 2010-08-30 DIAGNOSIS — G8929 Other chronic pain: Secondary | ICD-10-CM | POA: Insufficient documentation

## 2010-08-30 DIAGNOSIS — E876 Hypokalemia: Secondary | ICD-10-CM | POA: Insufficient documentation

## 2010-08-30 DIAGNOSIS — E119 Type 2 diabetes mellitus without complications: Secondary | ICD-10-CM | POA: Insufficient documentation

## 2010-08-30 DIAGNOSIS — R05 Cough: Secondary | ICD-10-CM | POA: Insufficient documentation

## 2010-08-30 DIAGNOSIS — M549 Dorsalgia, unspecified: Secondary | ICD-10-CM | POA: Insufficient documentation

## 2010-08-30 DIAGNOSIS — R197 Diarrhea, unspecified: Secondary | ICD-10-CM | POA: Insufficient documentation

## 2010-08-30 DIAGNOSIS — I10 Essential (primary) hypertension: Secondary | ICD-10-CM | POA: Insufficient documentation

## 2010-08-30 DIAGNOSIS — R350 Frequency of micturition: Secondary | ICD-10-CM | POA: Insufficient documentation

## 2010-08-30 DIAGNOSIS — R059 Cough, unspecified: Secondary | ICD-10-CM | POA: Insufficient documentation

## 2010-08-30 DIAGNOSIS — R112 Nausea with vomiting, unspecified: Secondary | ICD-10-CM | POA: Insufficient documentation

## 2010-08-30 LAB — URINALYSIS, ROUTINE W REFLEX MICROSCOPIC
Glucose, UA: NEGATIVE mg/dL
Hgb urine dipstick: NEGATIVE
Ketones, ur: 15 mg/dL — AB
Leukocytes, UA: NEGATIVE
Nitrite: NEGATIVE
Protein, ur: NEGATIVE mg/dL
Specific Gravity, Urine: 1.015 (ref 1.005–1.030)
Urobilinogen, UA: 0.2 mg/dL (ref 0.0–1.0)
pH: 6.5 (ref 5.0–8.0)

## 2010-08-30 LAB — GLUCOSE, CAPILLARY: Glucose-Capillary: 98 mg/dL (ref 70–99)

## 2010-08-30 LAB — POCT I-STAT, CHEM 8
BUN: 10 mg/dL (ref 6–23)
Calcium, Ion: 1.03 mmol/L — ABNORMAL LOW (ref 1.12–1.32)
Chloride: 107 mEq/L (ref 96–112)
Creatinine, Ser: 0.8 mg/dL (ref 0.50–1.10)
Glucose, Bld: 99 mg/dL (ref 70–99)
HCT: 43 % (ref 36.0–46.0)
Hemoglobin: 14.6 g/dL (ref 12.0–15.0)
Potassium: 3 mEq/L — ABNORMAL LOW (ref 3.5–5.1)
Sodium: 137 mEq/L (ref 135–145)
TCO2: 17 mmol/L (ref 0–100)

## 2010-08-31 LAB — URINE CULTURE
Colony Count: 25000
Culture  Setup Time: 201207092255

## 2010-09-09 ENCOUNTER — Encounter (HOSPITAL_COMMUNITY)
Admission: RE | Admit: 2010-09-09 | Discharge: 2010-09-09 | Disposition: A | Discharge status: Home / Self Care | Payer: Medicare Other | Source: Ambulatory Visit | Attending: Neurosurgery | Admitting: Neurosurgery

## 2010-09-09 LAB — DIFFERENTIAL
Basophils Absolute: 0 10*3/uL (ref 0.0–0.1)
Basophils Relative: 0 % (ref 0–1)
Eosinophils Absolute: 0.2 10*3/uL (ref 0.0–0.7)
Eosinophils Relative: 2 % (ref 0–5)
Lymphocytes Relative: 23 % (ref 12–46)
Lymphs Abs: 2.2 10*3/uL (ref 0.7–4.0)
Monocytes Absolute: 0.4 10*3/uL (ref 0.1–1.0)
Monocytes Relative: 5 % (ref 3–12)
Neutro Abs: 6.7 10*3/uL (ref 1.7–7.7)
Neutrophils Relative %: 70 % (ref 43–77)

## 2010-09-09 LAB — CBC
HCT: 36.6 % (ref 36.0–46.0)
Hemoglobin: 12.2 g/dL (ref 12.0–15.0)
MCH: 30.4 pg (ref 26.0–34.0)
MCHC: 33.3 g/dL (ref 30.0–36.0)
MCV: 91.3 fL (ref 78.0–100.0)
Platelets: 481 10*3/uL — ABNORMAL HIGH (ref 150–400)
RBC: 4.01 MIL/uL (ref 3.87–5.11)
RDW: 14 % (ref 11.5–15.5)
WBC: 9.6 10*3/uL (ref 4.0–10.5)

## 2010-09-09 LAB — SURGICAL PCR SCREEN
MRSA, PCR: NEGATIVE
Staphylococcus aureus: NEGATIVE

## 2010-09-09 LAB — BASIC METABOLIC PANEL
BUN: 11 mg/dL (ref 6–23)
CO2: 24 mEq/L (ref 19–32)
Calcium: 9.1 mg/dL (ref 8.4–10.5)
Chloride: 101 mEq/L (ref 96–112)
Creatinine, Ser: 0.63 mg/dL (ref 0.50–1.10)
GFR calc Af Amer: >60 mL/min (ref >60)
GFR calc non Af Amer: >60 mL/min (ref >60)
Glucose, Bld: 100 mg/dL — ABNORMAL HIGH (ref 70–99)
Potassium: 4.4 mEq/L (ref 3.5–5.1)
Sodium: 136 mEq/L (ref 135–145)

## 2010-09-09 LAB — HCG, SERUM, QUALITATIVE: Preg, Serum: NEGATIVE

## 2010-09-10 ENCOUNTER — Observation Stay (HOSPITAL_COMMUNITY)
Admission: RE | Admit: 2010-09-10 | Discharge: 2010-09-11 | Disposition: A | Discharge status: Home / Self Care | Payer: Medicare Other | Source: Ambulatory Visit | Attending: Neurosurgery | Admitting: Neurosurgery

## 2010-09-10 DIAGNOSIS — E119 Type 2 diabetes mellitus without complications: Secondary | ICD-10-CM | POA: Insufficient documentation

## 2010-09-10 DIAGNOSIS — Z79899 Other long term (current) drug therapy: Secondary | ICD-10-CM | POA: Insufficient documentation

## 2010-09-10 DIAGNOSIS — G8929 Other chronic pain: Secondary | ICD-10-CM | POA: Insufficient documentation

## 2010-09-10 DIAGNOSIS — F17200 Nicotine dependence, unspecified, uncomplicated: Secondary | ICD-10-CM | POA: Insufficient documentation

## 2010-09-10 DIAGNOSIS — I10 Essential (primary) hypertension: Secondary | ICD-10-CM | POA: Insufficient documentation

## 2010-09-10 DIAGNOSIS — Z462 Encounter for fitting and adjustment of other devices related to nervous system and special senses: Principal | ICD-10-CM | POA: Insufficient documentation

## 2010-09-10 DIAGNOSIS — K219 Gastro-esophageal reflux disease without esophagitis: Secondary | ICD-10-CM | POA: Insufficient documentation

## 2010-09-10 LAB — GLUCOSE, CAPILLARY
Glucose-Capillary: 113 mg/dL — ABNORMAL HIGH (ref 70–99)
Glucose-Capillary: 97 mg/dL (ref 70–99)
Glucose-Capillary: 97 mg/dL (ref 70–99)
Glucose-Capillary: 102 mg/dL — ABNORMAL HIGH (ref 70–99)

## 2010-09-11 LAB — GLUCOSE, CAPILLARY: Glucose-Capillary: 97 mg/dL (ref 70–99)

## 2010-09-20 NOTE — Op Note (Signed)
  [name], Madeline Dickerson                 ACCOUNT NO.:  [id number]  MEDICAL RECORD NO.:  [id number]  LOCATION:  3528                         FACILITY:  [facility]  PHYSICIAN:  Coletta Memos, M.D.     DATE OF BIRTH:  [birth year]  DATE OF PROCEDURE: DATE OF DISCHARGE:  09/11/2010                              OPERATIVE REPORT   PREOPERATIVE DIAGNOSIS:  Chronic pain.  POSTOPERATIVE DIAGNOSIS:  Chronic pain.  PROCEDURE:  Removal of spinal cord stimulator.  COMPLICATIONS:  None.  SURGEON:  Coletta Memos, MD  ANESTHESIA:  General endotracheal.  INDICATIONS:  [name] is a woman with chronic pain.  She had a spinal cord stimulator placed at her behest a few months ago.  She has now decided that she no longer wants the device.  OPERATIVE NOTE:  [name] was brought to the operating room.  She was intubated and then placed under a general anesthetic without difficulty. She was rolled prone onto body rolls and all pressure points without pressure.  Her back was prepped and then she was draped in a sterile fashion.  I opened 2 incisions, one over the generator and the other over the thoracic lumbar region.  These were old incisions which I opened.  I removed the device in its entirety from the 2 incisions and all of the wiring.  I then irrigated both wounds copiously.  I then closed the wounds in layered fashion using Vicryl sutures to reapproximate the subcutaneous layer, used Vicryls to reapproximate the subcuticular layers.  I used Dermabond in both areas.  No foreign bodies were left behind.  She tolerated the procedure well, was extubated and moving all extremities.          ______________________________ Coletta Memos, M.D.     KC/MEDQ  D:  09/19/2010  T:  09/20/2010  Job:  [id number]  Electronically Signed by Coletta Memos M.D. on 09/20/2010 03:09:54 PM

## 2010-09-20 NOTE — Discharge Summary (Signed)
  Madeline Dickerson, Madeline Dickerson                 ACCOUNT NO.:  [id number]  MEDICAL RECORD NO.:  [id number]  LOCATION:  3528                         FACILITY:  [facility]  PHYSICIAN:  Coletta Memos, M.D.     DATE OF BIRTH:  13-Sep-1960  DATE OF ADMISSION:  09/10/2010 DATE OF DISCHARGE:                              DISCHARGE SUMMARY   ADMITTING DIAGNOSIS:  Chronic pain.  DISCHARGE DIAGNOSIS:  Chronic pain.  PROCEDURE:  Removal of spinal cord stimulator.  COMPLICATIONS:  None.  DISCHARGE STATUS:  Alive and well.  DISCHARGE DESTINATION:  Home.  MEDICATIONS:  Percocet 10/325 for pain 1 p.o. q.6 as needed for pain.  Wound clean, dry.  No signs of infection at discharge, tolerating a regular diet, voiding without difficulty.  She will be seen in the office in 3-4 weeks.  Given instructions, no heavy bending, lifting or twisting.  No driving for 10 days.          ______________________________ Coletta Memos, M.D.     KC/MEDQ  D:  09/10/2010  T:  09/11/2010  Job:  [id number]  Electronically Signed by Coletta Memos M.D. on 09/20/2010 03:09:51 PM

## 2010-11-07 ENCOUNTER — Emergency Department (HOSPITAL_COMMUNITY): Payer: Medicare Other

## 2010-11-07 ENCOUNTER — Emergency Department (HOSPITAL_COMMUNITY)
Admission: EM | Admit: 2010-11-07 | Discharge: 2010-11-07 | Disposition: A | Discharge status: Home / Self Care | Payer: Medicare Other | Attending: Emergency Medicine | Admitting: Emergency Medicine

## 2010-11-07 DIAGNOSIS — G8929 Other chronic pain: Secondary | ICD-10-CM | POA: Insufficient documentation

## 2010-11-07 DIAGNOSIS — I10 Essential (primary) hypertension: Secondary | ICD-10-CM | POA: Insufficient documentation

## 2010-11-07 DIAGNOSIS — Z87891 Personal history of nicotine dependence: Secondary | ICD-10-CM | POA: Insufficient documentation

## 2010-11-07 DIAGNOSIS — K219 Gastro-esophageal reflux disease without esophagitis: Secondary | ICD-10-CM | POA: Insufficient documentation

## 2010-11-07 DIAGNOSIS — R0789 Other chest pain: Secondary | ICD-10-CM | POA: Insufficient documentation

## 2010-11-07 DIAGNOSIS — R112 Nausea with vomiting, unspecified: Secondary | ICD-10-CM | POA: Insufficient documentation

## 2010-11-07 DIAGNOSIS — Z79899 Other long term (current) drug therapy: Secondary | ICD-10-CM | POA: Insufficient documentation

## 2010-11-07 LAB — COMPREHENSIVE METABOLIC PANEL
ALT: 8 U/L (ref 0–35)
AST: 13 U/L (ref 0–37)
Albumin: 3.5 g/dL (ref 3.5–5.2)
Alkaline Phosphatase: 61 U/L (ref 39–117)
BUN: 14 mg/dL (ref 6–23)
CO2: 23 mEq/L (ref 19–32)
Calcium: 9.2 mg/dL (ref 8.4–10.5)
Chloride: 98 mEq/L (ref 96–112)
Creatinine, Ser: 0.87 mg/dL (ref 0.50–1.10)
GFR calc Af Amer: >60 mL/min (ref >60)
GFR calc non Af Amer: >60 mL/min (ref >60)
Glucose, Bld: 108 mg/dL — ABNORMAL HIGH (ref 70–99)
Potassium: 3.6 mEq/L (ref 3.5–5.1)
Sodium: 132 mEq/L — ABNORMAL LOW (ref 135–145)
Total Bilirubin: 0.3 mg/dL (ref 0.3–1.2)
Total Protein: 7 g/dL (ref 6.0–8.3)

## 2010-11-07 LAB — CBC
HCT: 35.6 % — ABNORMAL LOW (ref 36.0–46.0)
Hemoglobin: 12.4 g/dL (ref 12.0–15.0)
MCH: 30 pg (ref 26.0–34.0)
MCHC: 34.8 g/dL (ref 30.0–36.0)
MCV: 86 fL (ref 78.0–100.0)
Platelets: 402 10*3/uL — ABNORMAL HIGH (ref 150–400)
RBC: 4.14 MIL/uL (ref 3.87–5.11)
RDW: 14.3 % (ref 11.5–15.5)
WBC: 11.3 10*3/uL — ABNORMAL HIGH (ref 4.0–10.5)

## 2010-11-07 LAB — URINALYSIS, ROUTINE W REFLEX MICROSCOPIC
Bilirubin Urine: NEGATIVE
Glucose, UA: NEGATIVE mg/dL
Ketones, ur: 15 mg/dL — AB
Leukocytes, UA: NEGATIVE
Nitrite: NEGATIVE
Protein, ur: NEGATIVE mg/dL
Specific Gravity, Urine: 1.019 (ref 1.005–1.030)
Urobilinogen, UA: 1 mg/dL (ref 0.0–1.0)
pH: 6.5 (ref 5.0–8.0)

## 2010-11-07 LAB — RAPID URINE DRUG SCREEN, HOSP PERFORMED
Amphetamines: NOT DETECTED
Barbiturates: NOT DETECTED
Benzodiazepines: POSITIVE — AB
Cocaine: NOT DETECTED
Opiates: POSITIVE — AB
Tetrahydrocannabinol: POSITIVE — AB

## 2010-11-07 LAB — POCT I-STAT TROPONIN I
Troponin i, poc: 0 ng/mL (ref 0.00–0.08)
Troponin i, poc: 0 ng/mL (ref 0.00–0.08)

## 2010-11-07 LAB — URINE MICROSCOPIC-ADD ON

## 2010-11-13 ENCOUNTER — Emergency Department (HOSPITAL_COMMUNITY)
Admission: EM | Admit: 2010-11-13 | Discharge: 2010-11-13 | Disposition: A | Discharge status: Home / Self Care | Payer: Medicare Other | Attending: Emergency Medicine | Admitting: Emergency Medicine

## 2010-11-13 ENCOUNTER — Emergency Department (HOSPITAL_COMMUNITY): Payer: Medicare Other

## 2010-11-13 DIAGNOSIS — R112 Nausea with vomiting, unspecified: Secondary | ICD-10-CM | POA: Insufficient documentation

## 2010-11-13 DIAGNOSIS — I10 Essential (primary) hypertension: Secondary | ICD-10-CM | POA: Insufficient documentation

## 2010-11-13 DIAGNOSIS — K219 Gastro-esophageal reflux disease without esophagitis: Secondary | ICD-10-CM | POA: Insufficient documentation

## 2010-11-13 DIAGNOSIS — R079 Chest pain, unspecified: Secondary | ICD-10-CM | POA: Insufficient documentation

## 2010-11-13 LAB — DIFFERENTIAL
Basophils Absolute: 0 10*3/uL (ref 0.0–0.1)
Basophils Relative: 0 % (ref 0–1)
Eosinophils Absolute: 0.2 10*3/uL (ref 0.0–0.7)
Eosinophils Relative: 2 % (ref 0–5)
Lymphocytes Relative: 13 % (ref 12–46)
Lymphs Abs: 1.4 10*3/uL (ref 0.7–4.0)
Monocytes Absolute: 0.5 10*3/uL (ref 0.1–1.0)
Monocytes Relative: 5 % (ref 3–12)
Neutro Abs: 8.6 10*3/uL — ABNORMAL HIGH (ref 1.7–7.7)
Neutrophils Relative %: 80 % — ABNORMAL HIGH (ref 43–77)

## 2010-11-13 LAB — URINALYSIS, ROUTINE W REFLEX MICROSCOPIC
Bilirubin Urine: NEGATIVE
Glucose, UA: NEGATIVE mg/dL
Ketones, ur: NEGATIVE mg/dL
Leukocytes, UA: NEGATIVE
Nitrite: NEGATIVE
Protein, ur: NEGATIVE mg/dL
Specific Gravity, Urine: 1.007 (ref 1.005–1.030)
Urobilinogen, UA: 0.2 mg/dL (ref 0.0–1.0)
pH: 6.5 (ref 5.0–8.0)

## 2010-11-13 LAB — COMPREHENSIVE METABOLIC PANEL
ALT: 13 U/L (ref 0–35)
AST: 14 U/L (ref 0–37)
Albumin: 3.5 g/dL (ref 3.5–5.2)
Alkaline Phosphatase: 62 U/L (ref 39–117)
BUN: 9 mg/dL (ref 6–23)
CO2: 24 mEq/L (ref 19–32)
Calcium: 9.4 mg/dL (ref 8.4–10.5)
Chloride: 98 mEq/L (ref 96–112)
Creatinine, Ser: 0.69 mg/dL (ref 0.50–1.10)
GFR calc Af Amer: >60 mL/min (ref >60)
GFR calc non Af Amer: >60 mL/min (ref >60)
Glucose, Bld: 133 mg/dL — ABNORMAL HIGH (ref 70–99)
Potassium: 3 mEq/L — ABNORMAL LOW (ref 3.5–5.1)
Sodium: 134 mEq/L — ABNORMAL LOW (ref 135–145)
Total Bilirubin: 0.1 mg/dL — ABNORMAL LOW (ref 0.3–1.2)
Total Protein: 7 g/dL (ref 6.0–8.3)

## 2010-11-13 LAB — CBC
HCT: 35.6 % — ABNORMAL LOW (ref 36.0–46.0)
Hemoglobin: 11.8 g/dL — ABNORMAL LOW (ref 12.0–15.0)
MCH: 29.4 pg (ref 26.0–34.0)
MCHC: 33.1 g/dL (ref 30.0–36.0)
MCV: 88.6 fL (ref 78.0–100.0)
Platelets: 476 10*3/uL — ABNORMAL HIGH (ref 150–400)
RBC: 4.02 MIL/uL (ref 3.87–5.11)
RDW: 14.4 % (ref 11.5–15.5)
WBC: 10.8 10*3/uL — ABNORMAL HIGH (ref 4.0–10.5)

## 2010-11-13 LAB — URINE MICROSCOPIC-ADD ON: Urine-Other: NONE SEEN

## 2010-11-15 ENCOUNTER — Emergency Department (HOSPITAL_COMMUNITY)
Admission: EM | Admit: 2010-11-15 | Discharge: 2010-11-15 | Disposition: A | Discharge status: Home / Self Care | Payer: Medicare Other | Attending: Emergency Medicine | Admitting: Emergency Medicine

## 2010-11-15 DIAGNOSIS — I10 Essential (primary) hypertension: Secondary | ICD-10-CM | POA: Insufficient documentation

## 2010-11-15 DIAGNOSIS — R079 Chest pain, unspecified: Secondary | ICD-10-CM | POA: Insufficient documentation

## 2010-11-15 DIAGNOSIS — R112 Nausea with vomiting, unspecified: Secondary | ICD-10-CM | POA: Insufficient documentation

## 2010-11-15 DIAGNOSIS — R5381 Other malaise: Secondary | ICD-10-CM | POA: Insufficient documentation

## 2010-11-15 DIAGNOSIS — R109 Unspecified abdominal pain: Secondary | ICD-10-CM | POA: Insufficient documentation

## 2010-11-15 DIAGNOSIS — R011 Cardiac murmur, unspecified: Secondary | ICD-10-CM | POA: Insufficient documentation

## 2010-11-15 LAB — DIFFERENTIAL
Basophils Absolute: 0.1
Basophils Absolute: 0.1
Basophils Relative: 1
Basophils Relative: 1
Eosinophils Absolute: 0
Eosinophils Absolute: 0.1
Eosinophils Relative: 0
Eosinophils Relative: 1
Lymphocytes Relative: 19
Lymphocytes Relative: 28
Lymphs Abs: 2.6
Lymphs Abs: 2.7
Monocytes Absolute: 0.7
Monocytes Absolute: 1.1 — ABNORMAL HIGH
Monocytes Relative: 8
Monocytes Relative: 8
Neutro Abs: 10.1 — ABNORMAL HIGH
Neutro Abs: 5.9
Neutrophils Relative %: 62
Neutrophils Relative %: 73

## 2010-11-15 LAB — CBC
HCT: 34.9 — ABNORMAL LOW
HCT: 37.2
Hemoglobin: 11.7 — ABNORMAL LOW
Hemoglobin: 12.5
MCHC: 33.5
MCHC: 33.7
MCV: 87.7
MCV: 88.6
Platelets: 337
Platelets: 382
RBC: 3.95
RBC: 4.24
RDW: 15
RDW: 15.2
WBC: 14 — ABNORMAL HIGH
WBC: 9.4

## 2010-11-15 LAB — COMPREHENSIVE METABOLIC PANEL
ALT: 11
AST: 15
Albumin: 3.3 — ABNORMAL LOW
Alkaline Phosphatase: 67
BUN: 3 — ABNORMAL LOW
CO2: 25
Calcium: 8.9
Chloride: 110
Creatinine, Ser: 0.61
GFR calc Af Amer: >60
GFR calc non Af Amer: >60
Glucose, Bld: 82
Potassium: 4.1
Sodium: 139
Total Bilirubin: 0.3
Total Protein: 6.8

## 2010-11-15 LAB — URINALYSIS, ROUTINE W REFLEX MICROSCOPIC
Bilirubin Urine: NEGATIVE
Bilirubin Urine: NEGATIVE
Glucose, UA: NEGATIVE
Glucose, UA: NEGATIVE
Hgb urine dipstick: NEGATIVE
Hgb urine dipstick: NEGATIVE
Ketones, ur: NEGATIVE
Ketones, ur: NEGATIVE
Nitrite: NEGATIVE
Nitrite: NEGATIVE
Protein, ur: NEGATIVE
Protein, ur: NEGATIVE
Specific Gravity, Urine: 1.002 — ABNORMAL LOW
Specific Gravity, Urine: 1.006
Urobilinogen, UA: 0.2
Urobilinogen, UA: 0.2
pH: 6
pH: 7.5

## 2010-11-15 LAB — I-STAT 8, (EC8 V) (CONVERTED LAB)
Acid-base deficit: 6 — ABNORMAL HIGH
BUN: 6
Bicarbonate: 16.3 — ABNORMAL LOW
Chloride: 106
Glucose, Bld: 88
HCT: 42
Hemoglobin: 14.3
Operator id: 222501
Potassium: 3.3 — ABNORMAL LOW
Sodium: 134 — ABNORMAL LOW
TCO2: 17
pCO2, Ven: 25.4 — ABNORMAL LOW
pH, Ven: 7.415 — ABNORMAL HIGH

## 2010-11-15 LAB — POCT I-STAT, CHEM 8
BUN: 6 mg/dL (ref 6–23)
Calcium, Ion: 1.16 mmol/L (ref 1.12–1.32)
Chloride: 104 mEq/L (ref 96–112)
Creatinine, Ser: 0.7 mg/dL (ref 0.50–1.10)
Glucose, Bld: 96 mg/dL (ref 70–99)
HCT: 35 % — ABNORMAL LOW (ref 36.0–46.0)
Hemoglobin: 11.9 g/dL — ABNORMAL LOW (ref 12.0–15.0)
Potassium: 3.2 mEq/L — ABNORMAL LOW (ref 3.5–5.1)
Sodium: 138 mEq/L (ref 135–145)
TCO2: 21 mmol/L (ref 0–100)

## 2010-11-15 LAB — LIPASE, BLOOD: Lipase: 15

## 2010-11-15 LAB — POCT I-STAT CREATININE
Creatinine, Ser: 0.8
Operator id: 222501

## 2010-11-16 ENCOUNTER — Emergency Department (HOSPITAL_COMMUNITY): Payer: Medicare Other

## 2010-11-16 ENCOUNTER — Emergency Department (HOSPITAL_COMMUNITY)
Admission: EM | Admit: 2010-11-16 | Discharge: 2010-11-16 | Disposition: A | Discharge status: Home / Self Care | Payer: Medicare Other | Attending: Emergency Medicine | Admitting: Emergency Medicine

## 2010-11-16 DIAGNOSIS — M549 Dorsalgia, unspecified: Secondary | ICD-10-CM | POA: Insufficient documentation

## 2010-11-16 DIAGNOSIS — F17200 Nicotine dependence, unspecified, uncomplicated: Secondary | ICD-10-CM | POA: Insufficient documentation

## 2010-11-16 DIAGNOSIS — R071 Chest pain on breathing: Secondary | ICD-10-CM | POA: Insufficient documentation

## 2010-11-16 DIAGNOSIS — Z79899 Other long term (current) drug therapy: Secondary | ICD-10-CM | POA: Insufficient documentation

## 2010-11-16 DIAGNOSIS — R112 Nausea with vomiting, unspecified: Secondary | ICD-10-CM | POA: Insufficient documentation

## 2010-11-16 DIAGNOSIS — K219 Gastro-esophageal reflux disease without esophagitis: Secondary | ICD-10-CM | POA: Insufficient documentation

## 2010-11-16 DIAGNOSIS — I10 Essential (primary) hypertension: Secondary | ICD-10-CM | POA: Insufficient documentation

## 2010-11-16 DIAGNOSIS — R0609 Other forms of dyspnea: Secondary | ICD-10-CM | POA: Insufficient documentation

## 2010-11-16 DIAGNOSIS — R109 Unspecified abdominal pain: Secondary | ICD-10-CM | POA: Insufficient documentation

## 2010-11-16 DIAGNOSIS — R0989 Other specified symptoms and signs involving the circulatory and respiratory systems: Secondary | ICD-10-CM | POA: Insufficient documentation

## 2010-11-16 DIAGNOSIS — G8929 Other chronic pain: Secondary | ICD-10-CM | POA: Insufficient documentation

## 2010-11-16 LAB — COMPREHENSIVE METABOLIC PANEL
ALT: 12 U/L (ref 0–35)
AST: 13 U/L (ref 0–37)
Albumin: 3.2 g/dL — ABNORMAL LOW (ref 3.5–5.2)
Alkaline Phosphatase: 58 U/L (ref 39–117)
BUN: 7 mg/dL (ref 6–23)
CO2: 25 mEq/L (ref 19–32)
Calcium: 9.1 mg/dL (ref 8.4–10.5)
Chloride: 102 mEq/L (ref 96–112)
Creatinine, Ser: 0.67 mg/dL (ref 0.50–1.10)
GFR calc Af Amer: >60 mL/min (ref >60)
GFR calc non Af Amer: >60 mL/min (ref >60)
Glucose, Bld: 111 mg/dL — ABNORMAL HIGH (ref 70–99)
Potassium: 4 mEq/L (ref 3.5–5.1)
Sodium: 135 mEq/L (ref 135–145)
Total Bilirubin: 0.1 mg/dL — ABNORMAL LOW (ref 0.3–1.2)
Total Protein: 6.3 g/dL (ref 6.0–8.3)

## 2010-11-16 LAB — DIFFERENTIAL
Basophils Absolute: 0 10*3/uL (ref 0.0–0.1)
Basophils Relative: 0 % (ref 0–1)
Eosinophils Absolute: 0.2 10*3/uL (ref 0.0–0.7)
Eosinophils Relative: 2 % (ref 0–5)
Lymphocytes Relative: 17 % (ref 12–46)
Lymphs Abs: 1.7 10*3/uL (ref 0.7–4.0)
Monocytes Absolute: 0.6 10*3/uL (ref 0.1–1.0)
Monocytes Relative: 6 % (ref 3–12)
Neutro Abs: 7.7 10*3/uL (ref 1.7–7.7)
Neutrophils Relative %: 75 % (ref 43–77)

## 2010-11-16 LAB — CBC
HCT: 32.3 % — ABNORMAL LOW (ref 36.0–46.0)
Hemoglobin: 10.9 g/dL — ABNORMAL LOW (ref 12.0–15.0)
MCH: 29.7 pg (ref 26.0–34.0)
MCHC: 33.7 g/dL (ref 30.0–36.0)
MCV: 88 fL (ref 78.0–100.0)
Platelets: 400 10*3/uL (ref 150–400)
RBC: 3.67 MIL/uL — ABNORMAL LOW (ref 3.87–5.11)
RDW: 14.9 % (ref 11.5–15.5)
WBC: 10.3 10*3/uL (ref 4.0–10.5)

## 2010-11-16 LAB — URINALYSIS, ROUTINE W REFLEX MICROSCOPIC
Bilirubin Urine: NEGATIVE
Glucose, UA: NEGATIVE mg/dL
Ketones, ur: NEGATIVE mg/dL
Leukocytes, UA: NEGATIVE
Nitrite: NEGATIVE
Protein, ur: NEGATIVE mg/dL
Specific Gravity, Urine: 1.007 (ref 1.005–1.030)
Urobilinogen, UA: 0.2 mg/dL (ref 0.0–1.0)
pH: 7 (ref 5.0–8.0)

## 2010-11-16 LAB — URINE MICROSCOPIC-ADD ON

## 2010-11-16 LAB — POCT I-STAT TROPONIN I: Troponin i, poc: 0 ng/mL (ref 0.00–0.08)

## 2010-11-16 LAB — POCT PREGNANCY, URINE: Preg Test, Ur: NEGATIVE

## 2010-11-17 ENCOUNTER — Other Ambulatory Visit (HOSPITAL_COMMUNITY): Payer: Self-pay | Admitting: Gastroenterology

## 2010-11-17 DIAGNOSIS — R112 Nausea with vomiting, unspecified: Secondary | ICD-10-CM

## 2010-11-22 LAB — URINALYSIS, ROUTINE W REFLEX MICROSCOPIC
Bilirubin Urine: NEGATIVE
Glucose, UA: NEGATIVE
Ketones, ur: NEGATIVE
Leukocytes, UA: NEGATIVE
Nitrite: NEGATIVE
Protein, ur: NEGATIVE
Specific Gravity, Urine: 1.011
Urobilinogen, UA: 0.2
pH: 6.5

## 2010-11-22 LAB — POCT PREGNANCY, URINE: Preg Test, Ur: NEGATIVE

## 2010-11-22 LAB — URINE MICROSCOPIC-ADD ON

## 2010-12-03 ENCOUNTER — Encounter (HOSPITAL_COMMUNITY)
Admission: RE | Admit: 2010-12-03 | Discharge: 2010-12-03 | Disposition: A | Discharge status: Home / Self Care | Payer: Medicare Other | Source: Ambulatory Visit | Attending: Gastroenterology | Admitting: Gastroenterology

## 2010-12-03 DIAGNOSIS — R143 Flatulence: Secondary | ICD-10-CM | POA: Insufficient documentation

## 2010-12-03 DIAGNOSIS — R112 Nausea with vomiting, unspecified: Secondary | ICD-10-CM

## 2010-12-03 DIAGNOSIS — K219 Gastro-esophageal reflux disease without esophagitis: Secondary | ICD-10-CM | POA: Insufficient documentation

## 2010-12-03 DIAGNOSIS — R141 Gas pain: Secondary | ICD-10-CM | POA: Insufficient documentation

## 2010-12-03 DIAGNOSIS — R142 Eructation: Secondary | ICD-10-CM | POA: Insufficient documentation

## 2010-12-03 DIAGNOSIS — R109 Unspecified abdominal pain: Secondary | ICD-10-CM | POA: Insufficient documentation

## 2010-12-03 MED ORDER — TECHNETIUM TC 99M MEBROFENIN IV KIT
5.0000 | PACK | Freq: Once | INTRAVENOUS | Status: AC | PRN
  Administered 2010-12-03: 5 via INTRAVENOUS

## 2010-12-06 ENCOUNTER — Encounter (HOSPITAL_COMMUNITY)
Admission: RE | Admit: 2010-12-06 | Discharge: 2010-12-06 | Disposition: A | Discharge status: Home / Self Care | Payer: Medicare Other | Source: Ambulatory Visit | Attending: Gastroenterology | Admitting: Gastroenterology

## 2010-12-06 DIAGNOSIS — R112 Nausea with vomiting, unspecified: Secondary | ICD-10-CM

## 2010-12-06 MED ORDER — TECHNETIUM TC 99M SULFUR COLLOID
2.0000 | Freq: Once | INTRAVENOUS | Status: AC | PRN
  Administered 2010-12-06: 2 via INTRAVENOUS

## 2010-12-14 ENCOUNTER — Ambulatory Visit (HOSPITAL_COMMUNITY)
Admission: RE | Admit: 2010-12-14 | Discharge: 2010-12-14 | Disposition: A | Discharge status: Home / Self Care | Payer: Medicare Other | Source: Ambulatory Visit | Attending: Gastroenterology | Admitting: Gastroenterology

## 2010-12-14 DIAGNOSIS — R112 Nausea with vomiting, unspecified: Secondary | ICD-10-CM | POA: Insufficient documentation

## 2010-12-14 DIAGNOSIS — K219 Gastro-esophageal reflux disease without esophagitis: Secondary | ICD-10-CM | POA: Insufficient documentation

## 2011-01-06 ENCOUNTER — Telehealth: Payer: Self-pay | Admitting: Internal Medicine

## 2011-01-06 NOTE — Telephone Encounter (Signed)
[phone] Pt called. She said she had spoke to dr Darrick Huntsman @ [facility] about getting her meds refilled Dr Darrick Huntsman said to call office and give name of med and drug store  diovan hct 160 mg/12.5 mg tablet  1 daily  [facility]  Pt is complete out of her meds

## 2011-01-07 MED ORDER — VALSARTAN-HYDROCHLOROTHIAZIDE 160-12.5 MG PO TABS: 1.0000 | ORAL_TABLET | Freq: Every day | ORAL | Status: DC

## 2011-01-07 NOTE — Telephone Encounter (Signed)
Ok to refill,. She is a new patient but has not been seen yet and I have seen her every day in the hospital with Ms. Madeline Dickerson

## 2011-01-07 NOTE — Telephone Encounter (Signed)
Rx sent to pharmacy electronically. Left message notifying patient to check with pharmacy.

## 2011-01-10 ENCOUNTER — Other Ambulatory Visit: Payer: Self-pay | Admitting: Internal Medicine

## 2011-01-17 ENCOUNTER — Encounter: Payer: Self-pay | Admitting: Internal Medicine

## 2011-01-17 ENCOUNTER — Ambulatory Visit (INDEPENDENT_AMBULATORY_CARE_PROVIDER_SITE_OTHER): Payer: Medicare Other | Admitting: Internal Medicine

## 2011-01-17 DIAGNOSIS — Z7189 Other specified counseling: Secondary | ICD-10-CM

## 2011-01-17 DIAGNOSIS — Z1211 Encounter for screening for malignant neoplasm of colon: Secondary | ICD-10-CM | POA: Insufficient documentation

## 2011-01-17 DIAGNOSIS — Z716 Tobacco abuse counseling: Secondary | ICD-10-CM | POA: Insufficient documentation

## 2011-01-17 DIAGNOSIS — F329 Major depressive disorder, single episode, unspecified: Secondary | ICD-10-CM | POA: Insufficient documentation

## 2011-01-17 DIAGNOSIS — F419 Anxiety disorder, unspecified: Secondary | ICD-10-CM | POA: Insufficient documentation

## 2011-01-17 DIAGNOSIS — F411 Generalized anxiety disorder: Secondary | ICD-10-CM

## 2011-01-17 DIAGNOSIS — F17200 Nicotine dependence, unspecified, uncomplicated: Secondary | ICD-10-CM

## 2011-01-17 DIAGNOSIS — R9431 Abnormal electrocardiogram [ECG] [EKG]: Secondary | ICD-10-CM | POA: Insufficient documentation

## 2011-01-17 DIAGNOSIS — Z720 Tobacco use: Secondary | ICD-10-CM

## 2011-01-17 DIAGNOSIS — G8929 Other chronic pain: Secondary | ICD-10-CM

## 2011-01-17 DIAGNOSIS — M549 Dorsalgia, unspecified: Secondary | ICD-10-CM

## 2011-01-17 DIAGNOSIS — I10 Essential (primary) hypertension: Secondary | ICD-10-CM

## 2011-01-17 LAB — MICROALBUMIN / CREATININE URINE RATIO
Creatinine,U: 93.5 mg/dL
Microalb Creat Ratio: 1.1 mg/g (ref 0.0–30.0)
Microalb, Ur: 1 mg/dL (ref 0.0–1.9)

## 2011-01-17 MED ORDER — DIAZEPAM 5 MG PO TABS: 5.0000 mg | ORAL_TABLET | Freq: Two times a day (BID) | ORAL | Status: DC | PRN

## 2011-01-17 MED ORDER — OXYCODONE-ACETAMINOPHEN 10-325 MG PO TABS: 1.0000 | ORAL_TABLET | Freq: Four times a day (QID) | ORAL | Status: AC | PRN
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
## 2011-01-17 NOTE — Patient Instructions (Signed)
I want you to decrease your cigarette use by one cigarette per day every week.  Try brewing and drinking hot tea as an alternative to your mid morning cigarette and enyoy 15 minutes outdooors in [place] during your tea break  Return in one month for you annual physical  We will refer you to a therapist

## 2011-01-18 LAB — DRUG SCREEN, URINE
Amphetamine Screen, Ur: NEGATIVE
Barbiturate Quant, Ur: NEGATIVE
Benzodiazepines.: POSITIVE — AB
Cocaine Metabolites: POSITIVE — AB
Creatinine,U: 100.4 mg/dL
Marijuana Metabolite: POSITIVE — AB
Methadone: NEGATIVE
Opiates: POSITIVE — AB
Phencyclidine (PCP): NEGATIVE
Propoxyphene: NEGATIVE

## 2011-01-26 ENCOUNTER — Other Ambulatory Visit: Payer: Self-pay | Admitting: Internal Medicine

## 2011-01-26 DIAGNOSIS — G8929 Other chronic pain: Secondary | ICD-10-CM

## 2011-01-26 NOTE — Telephone Encounter (Signed)
Patient called to see if we could move her physical up before the 26th of December, I noticed that she was on Dr. Tilman Neat schedule instead of Dr. Melina Schools.  I gave her a physical appointment for Jan. She needs her oxycodon 10/325 mg before her appt. She states it is due on 02/14/11 Dr. Darrick Huntsman is out of the office on the 26th.  Please notify patient when this prescription is ready for her to pick up.

## 2011-01-28 NOTE — Telephone Encounter (Signed)
Left message asking that patient return my call.  

## 2011-02-01 NOTE — Telephone Encounter (Signed)
Tried calling patient again. Left another message.

## 2011-02-07 ENCOUNTER — Other Ambulatory Visit: Payer: Self-pay | Admitting: *Deleted

## 2011-02-07 NOTE — Telephone Encounter (Signed)
Patient requesting a call regarding RF

## 2011-02-08 MED ORDER — OXYCODONE HCL 10 MG PO TB12: 10.0000 mg | ORAL_TABLET | Freq: Four times a day (QID) | ORAL | Status: DC

## 2011-02-08 NOTE — Telephone Encounter (Signed)
Patient is asking if she can get a refill on her oxycodone. She says that she will be out of town next week when it is due. She says that if you could write it for the 24th that would be fine. She would like to pick this up on Friday if possible.   She also wanted to thank each of [place] for calling her back in a timely manner and responding to her calls so quickly and wanted me to tell you [facility] Christmas.

## 2011-02-08 NOTE — Telephone Encounter (Signed)
Called #, person who answered said they would be w/pt soon and would give her mess that I called to inquire as to what her questions were.

## 2011-02-09 ENCOUNTER — Encounter: Payer: Self-pay | Admitting: Internal Medicine

## 2011-02-10 ENCOUNTER — Encounter: Payer: Self-pay | Admitting: Internal Medicine

## 2011-02-11 ENCOUNTER — Other Ambulatory Visit: Payer: Self-pay | Admitting: Internal Medicine

## 2011-02-11 NOTE — Telephone Encounter (Signed)
Pt picked up RX today .

## 2011-02-16 ENCOUNTER — Encounter: Payer: Medicare Other | Admitting: Internal Medicine

## 2011-02-16 ENCOUNTER — Telehealth: Payer: Self-pay | Admitting: *Deleted

## 2011-02-16 NOTE — Telephone Encounter (Signed)
Advised pt that Dr. Dan Humphreys will not be able to refill today, that Dr. Darrick Huntsman will address this reqest when she returns to the office tomorrow.

## 2011-02-16 NOTE — Telephone Encounter (Signed)
Pt was given a script for oxycontin in error, should have been percocet.  She took the script to the pharmacy but her insurance won't cover it.  She is asking that we call [facility] [address] to cancel that script and she would like to pick up a new script for the percocet today.  Please advise.  OK to leave a message at [phone], her husband's cell phone.

## 2011-02-16 NOTE — Telephone Encounter (Signed)
Urine drug screen was pos for cocaine. I will not prescribe pt narcotics. She will need to wait to discuss with Dr. Darrick Huntsman, or if pain severe, go to ED or urgent care.

## 2011-02-17 ENCOUNTER — Telehealth: Payer: Self-pay | Admitting: *Deleted

## 2011-02-17 ENCOUNTER — Other Ambulatory Visit: Payer: Self-pay | Admitting: *Deleted

## 2011-02-17 MED ORDER — OXYCODONE-ACETAMINOPHEN 5-325 MG PO TABS: 1.0000 | ORAL_TABLET | Freq: Four times a day (QID) | ORAL | Status: DC | PRN

## 2011-02-17 NOTE — Telephone Encounter (Signed)
Opened in error

## 2011-02-17 NOTE — Telephone Encounter (Signed)
Patient notified. She is aware of urine sample. She will come in the morning and ask for me.

## 2011-02-17 NOTE — Telephone Encounter (Signed)
Yes , in that case I will refill the percocet.  When she gets here, she will need to submit another urine for drug screen.  This is because she had cocaine in the first drug screen, which she told me in advance would be there.  You can tell her in advance, so she is not surprised and not able to produce urine.  She will not be able to take her purse into the bathroom with her.  She can have the prescription once she gives [place] a urine sample.

## 2011-02-17 NOTE — Telephone Encounter (Signed)
Patient called back requesting percocet refill. She said she was given a script for oxycontin in error when it should've been percocet. I verified with [facility] [address] that she did drop off script there, but did not pick up oxycontin. I advised for them to cancel that prescription. Will you refill the percocet without her coming in for an OV with the pharmacy verification or do you still want her to be seen?

## 2011-02-18 ENCOUNTER — Other Ambulatory Visit: Payer: Self-pay | Admitting: Internal Medicine

## 2011-02-18 ENCOUNTER — Telehealth: Payer: Self-pay | Admitting: Internal Medicine

## 2011-02-18 ENCOUNTER — Ambulatory Visit: Payer: Medicare Other

## 2011-02-18 DIAGNOSIS — Z79899 Other long term (current) drug therapy: Secondary | ICD-10-CM

## 2011-02-18 NOTE — Telephone Encounter (Signed)
Message copied by Duncan Dull on Fri Feb 18, 2011 11:56 AM ------      Message from: Madeline Dickerson      Created: Fri Feb 18, 2011 11:26 AM       [name] reminder to order urine drug screen on patient.             Thanks!

## 2011-02-19 LAB — DRUG SCREEN, URINE
Amphetamine Screen, Ur: NEGATIVE
Barbiturate Quant, Ur: NEGATIVE
Benzodiazepines.: POSITIVE — AB
Cocaine Metabolites: NEGATIVE
Creatinine,U: 131.8 mg/dL
Marijuana Metabolite: NEGATIVE
Methadone: NEGATIVE
Opiates: NEGATIVE
Phencyclidine (PCP): NEGATIVE
Propoxyphene: NEGATIVE

## 2011-02-25 ENCOUNTER — Encounter: Payer: Self-pay | Admitting: Internal Medicine

## 2011-02-25 ENCOUNTER — Ambulatory Visit (INDEPENDENT_AMBULATORY_CARE_PROVIDER_SITE_OTHER): Payer: Medicare Other | Admitting: Internal Medicine

## 2011-02-25 VITALS — BP 120/74 | HR 82 | Temp 97.8°F | Ht 66.25 in | Wt 182.0 lb

## 2011-02-25 DIAGNOSIS — G8929 Other chronic pain: Secondary | ICD-10-CM | POA: Diagnosis not present

## 2011-02-25 DIAGNOSIS — F329 Major depressive disorder, single episode, unspecified: Secondary | ICD-10-CM

## 2011-02-25 DIAGNOSIS — Z79899 Other long term (current) drug therapy: Secondary | ICD-10-CM

## 2011-02-25 DIAGNOSIS — M549 Dorsalgia, unspecified: Secondary | ICD-10-CM

## 2011-02-25 DIAGNOSIS — M545 Low back pain: Secondary | ICD-10-CM | POA: Diagnosis not present

## 2011-02-25 DIAGNOSIS — R739 Hyperglycemia, unspecified: Secondary | ICD-10-CM

## 2011-02-25 MED ORDER — FLUOXETINE HCL 40 MG PO CAPS: 40.0000 mg | ORAL_CAPSULE | Freq: Every day | ORAL | Status: DC

## 2011-02-25 MED ORDER — OXYCODONE-ACETAMINOPHEN 10-325 MG PO TABS: 1.0000 | ORAL_TABLET | Freq: Four times a day (QID) | ORAL | Status: DC | PRN

## 2011-02-25 MED ORDER — LIDOCAINE 5 % EX PTCH: 1.0000 | MEDICATED_PATCH | CUTANEOUS | Status: AC

## 2011-02-25 MED ORDER — OXYCODONE-ACETAMINOPHEN 10-325 MG PO TABS: 1.0000 | ORAL_TABLET | Freq: Four times a day (QID) | ORAL | Status: AC | PRN
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
## 2011-02-25 NOTE — Patient Instructions (Signed)
You may  combine ibuprofen with Percocet safely.  You must refill your narcotics at the same pharmacy on a monthly basis for me to continue prescribing it.

## 2011-02-27 ENCOUNTER — Encounter: Payer: Self-pay | Admitting: Internal Medicine

## 2011-02-27 NOTE — Assessment & Plan Note (Addendum)
With 4 prior surgeries, acutely aggravated by recent activity at home.  She is dependent on narcotics to manage her pain and usually takes 10 mg of percocet four times daily. I have given her a new rx for percocet 10/325 #120 today, and have asked her to submit a urine drug screen and to choose one pharmacy where she will will her narcotics prescriptions.  Although I have been quite confrontational with her today,  She answered appropratiely and did not give me any reason to think she was abusing or diverting her medications.

## 2011-02-28 ENCOUNTER — Encounter: Payer: Self-pay | Admitting: Internal Medicine

## 2011-02-28 DIAGNOSIS — M549 Dorsalgia, unspecified: Secondary | ICD-10-CM | POA: Diagnosis not present

## 2011-02-28 NOTE — Progress Notes (Signed)
Addended by: Jobie Quaker on: 02/28/2011 04:11 PM   Modules accepted: Orders

## 2011-02-28 NOTE — Progress Notes (Signed)
Addended by: Jobie Quaker on: 02/28/2011 04:21 PM   Modules accepted: Orders

## 2011-03-01 ENCOUNTER — Telehealth: Payer: Self-pay | Admitting: *Deleted

## 2011-03-01 LAB — DRUG SCREEN, URINE
Amphetamine Screen, Ur: NEGATIVE
Barbiturate Quant, Ur: NEGATIVE
Benzodiazepines.: POSITIVE — AB
Cocaine Metabolites: POSITIVE — AB
Creatinine,U: 63.05 mg/dL
Marijuana Metabolite: POSITIVE — AB
Methadone: NEGATIVE
Opiates: NEGATIVE
Phencyclidine (PCP): NEGATIVE
Propoxyphene: NEGATIVE

## 2011-03-01 NOTE — Telephone Encounter (Signed)
Prior Berkley Harvey is required for Lidoderm 54% patch. Called and requested form.

## 2011-03-02 NOTE — Telephone Encounter (Signed)
Form received

## 2011-03-03 NOTE — Telephone Encounter (Signed)
Form given to Dr. Tullo for signature. 

## 2011-03-07 ENCOUNTER — Telehealth: Payer: Self-pay | Admitting: Internal Medicine

## 2011-03-07 NOTE — Telephone Encounter (Signed)
WE can refer to [facility] for a TENS Unit/.  Would she like to give it a try?

## 2011-03-07 NOTE — Telephone Encounter (Signed)
Patient has been notified. She is asking if there is anything else she can try.

## 2011-03-07 NOTE — Telephone Encounter (Signed)
No, I prefer that she not.  It is my choice whether  to prescribe narcotics to a patient , and I am not willing to give her another chance.

## 2011-03-07 NOTE — Telephone Encounter (Signed)
Form was faxed, we received letter stating that lidoderm was denied.

## 2011-03-07 NOTE — Telephone Encounter (Signed)
Message copied by Duncan Dull on Mon Mar 07, 2011  6:13 PM ------      Message from: Jobie Quaker      Created: Mon Mar 07, 2011  3:16 PM       Patient notified. She says that she has not done anything, and she doesn't think that it is accurate. I explained to her that this was non negotiable, but she argued against that and said that she feels she deserves another chance. She is asking if she can come in and repeat the urine drug screen.

## 2011-03-08 NOTE — Telephone Encounter (Signed)
Tried calling patient, but was told that she was not there Will try again later

## 2011-03-08 NOTE — Telephone Encounter (Signed)
Tried calling patient back, but was told that she was not home. I will try her again later.

## 2011-03-09 NOTE — Telephone Encounter (Signed)
Tried calling patient again, but go voicemail stating that mailbox was full.

## 2011-03-10 DIAGNOSIS — M48061 Spinal stenosis, lumbar region without neurogenic claudication: Secondary | ICD-10-CM | POA: Diagnosis not present

## 2011-03-10 NOTE — Telephone Encounter (Signed)
Tried calling patient again. Left message asking her to call,. Will wait to hear from her.

## 2011-03-23 ENCOUNTER — Ambulatory Visit: Payer: Self-pay | Admitting: Psychology

## 2011-03-24 ENCOUNTER — Other Ambulatory Visit: Payer: Self-pay | Admitting: Neurosurgery

## 2011-03-24 DIAGNOSIS — M546 Pain in thoracic spine: Secondary | ICD-10-CM

## 2011-03-30 ENCOUNTER — Ambulatory Visit
Admission: RE | Admit: 2011-03-30 | Discharge: 2011-03-30 | Disposition: A | Discharge status: Home / Self Care | Payer: Medicare Other | Source: Ambulatory Visit | Attending: Neurosurgery | Admitting: Neurosurgery

## 2011-03-30 DIAGNOSIS — M5124 Other intervertebral disc displacement, thoracic region: Secondary | ICD-10-CM | POA: Diagnosis not present

## 2011-03-30 DIAGNOSIS — M47814 Spondylosis without myelopathy or radiculopathy, thoracic region: Secondary | ICD-10-CM | POA: Diagnosis not present

## 2011-03-30 DIAGNOSIS — M546 Pain in thoracic spine: Secondary | ICD-10-CM

## 2011-03-30 DIAGNOSIS — R209 Unspecified disturbances of skin sensation: Secondary | ICD-10-CM | POA: Diagnosis not present

## 2011-04-01 ENCOUNTER — Other Ambulatory Visit (INDEPENDENT_AMBULATORY_CARE_PROVIDER_SITE_OTHER): Payer: Medicare Other | Admitting: *Deleted

## 2011-04-01 DIAGNOSIS — I10 Essential (primary) hypertension: Secondary | ICD-10-CM | POA: Diagnosis not present

## 2011-04-01 DIAGNOSIS — E119 Type 2 diabetes mellitus without complications: Secondary | ICD-10-CM

## 2011-04-01 LAB — HEMOGLOBIN A1C: Hgb A1c MFr Bld: 6 % (ref 4.6–6.5)

## 2011-04-01 LAB — COMPREHENSIVE METABOLIC PANEL
ALT: 15 U/L (ref 0–35)
AST: 22 U/L (ref 0–37)
Albumin: 4.3 g/dL (ref 3.5–5.2)
Alkaline Phosphatase: 71 U/L (ref 39–117)
BUN: 17 mg/dL (ref 6–23)
CO2: 21 mEq/L (ref 19–32)
Calcium: 9.7 mg/dL (ref 8.4–10.5)
Chloride: 104 mEq/L (ref 96–112)
Creatinine, Ser: 1 mg/dL (ref 0.4–1.2)
GFR: 61.09 mL/min (ref >60.00)
Glucose, Bld: 70 mg/dL (ref 70–99)
Potassium: 4 mEq/L (ref 3.5–5.1)
Sodium: 137 mEq/L (ref 135–145)
Total Bilirubin: 0.4 mg/dL (ref 0.3–1.2)
Total Protein: 7.9 g/dL (ref 6.0–8.3)

## 2011-04-05 ENCOUNTER — Other Ambulatory Visit: Payer: Self-pay | Admitting: Neurosurgery

## 2011-04-05 DIAGNOSIS — M48061 Spinal stenosis, lumbar region without neurogenic claudication: Secondary | ICD-10-CM | POA: Diagnosis not present

## 2011-04-07 ENCOUNTER — Emergency Department (HOSPITAL_COMMUNITY)
Admission: EM | Admit: 2011-04-07 | Discharge: 2011-04-08 | Disposition: A | Discharge status: Home / Self Care | Payer: Medicare Other | Attending: Emergency Medicine | Admitting: Emergency Medicine

## 2011-04-07 ENCOUNTER — Other Ambulatory Visit: Payer: Self-pay

## 2011-04-07 ENCOUNTER — Encounter (HOSPITAL_COMMUNITY): Payer: Self-pay | Admitting: *Deleted

## 2011-04-07 DIAGNOSIS — I10 Essential (primary) hypertension: Secondary | ICD-10-CM | POA: Insufficient documentation

## 2011-04-07 DIAGNOSIS — X503XXA Overexertion from repetitive movements, initial encounter: Secondary | ICD-10-CM | POA: Insufficient documentation

## 2011-04-07 DIAGNOSIS — M545 Low back pain, unspecified: Secondary | ICD-10-CM | POA: Insufficient documentation

## 2011-04-07 DIAGNOSIS — M5380 Other specified dorsopathies, site unspecified: Secondary | ICD-10-CM | POA: Diagnosis not present

## 2011-04-07 DIAGNOSIS — F341 Dysthymic disorder: Secondary | ICD-10-CM | POA: Diagnosis not present

## 2011-04-07 DIAGNOSIS — Z79899 Other long term (current) drug therapy: Secondary | ICD-10-CM | POA: Insufficient documentation

## 2011-04-07 DIAGNOSIS — G8929 Other chronic pain: Secondary | ICD-10-CM | POA: Insufficient documentation

## 2011-04-07 DIAGNOSIS — M549 Dorsalgia, unspecified: Secondary | ICD-10-CM

## 2011-04-07 DIAGNOSIS — Y93F2 Activity, caregiving, lifting: Secondary | ICD-10-CM | POA: Insufficient documentation

## 2011-04-07 LAB — CBC
HCT: 36.6 % (ref 36.0–46.0)
Hemoglobin: 12.7 g/dL (ref 12.0–15.0)
MCH: 30.6 pg (ref 26.0–34.0)
MCHC: 34.7 g/dL (ref 30.0–36.0)
MCV: 88.2 fL (ref 78.0–100.0)
Platelets: 385 10*3/uL (ref 150–400)
RBC: 4.15 MIL/uL (ref 3.87–5.11)
RDW: 15.6 % — ABNORMAL HIGH (ref 11.5–15.5)
WBC: 9.9 10*3/uL (ref 4.0–10.5)

## 2011-04-07 LAB — DIFFERENTIAL
Basophils Absolute: 0.1 10*3/uL (ref 0.0–0.1)
Basophils Relative: 1 % (ref 0–1)
Eosinophils Absolute: 0.4 10*3/uL (ref 0.0–0.7)
Eosinophils Relative: 4 % (ref 0–5)
Lymphocytes Relative: 42 % (ref 12–46)
Lymphs Abs: 4.1 10*3/uL — ABNORMAL HIGH (ref 0.7–4.0)
Monocytes Absolute: 0.4 10*3/uL (ref 0.1–1.0)
Monocytes Relative: 4 % (ref 3–12)
Neutro Abs: 4.9 10*3/uL (ref 1.7–7.7)
Neutrophils Relative %: 50 % (ref 43–77)

## 2011-04-07 LAB — POCT I-STAT TROPONIN I: Troponin i, poc: 0 ng/mL (ref 0.00–0.08)

## 2011-04-07 NOTE — ED Notes (Signed)
The pt has chronic back pain and today she has had some anxiety with some sob back pain and chest pain for the past day.  She says she feels very overwhelmed .  She is currently  hyperventilating

## 2011-04-08 LAB — COMPREHENSIVE METABOLIC PANEL
ALT: 14 U/L (ref 0–35)
AST: 14 U/L (ref 0–37)
Albumin: 3.7 g/dL (ref 3.5–5.2)
Alkaline Phosphatase: 79 U/L (ref 39–117)
BUN: 13 mg/dL (ref 6–23)
CO2: 19 mEq/L (ref 19–32)
Calcium: 9.5 mg/dL (ref 8.4–10.5)
Chloride: 105 mEq/L (ref 96–112)
Creatinine, Ser: 0.77 mg/dL (ref 0.50–1.10)
GFR calc Af Amer: >90 mL/min (ref >90)
GFR calc non Af Amer: >90 mL/min (ref >90)
Glucose, Bld: 143 mg/dL — ABNORMAL HIGH (ref 70–99)
Potassium: 3.2 mEq/L — ABNORMAL LOW (ref 3.5–5.1)
Sodium: 138 mEq/L (ref 135–145)
Total Bilirubin: 0.1 mg/dL — ABNORMAL LOW (ref 0.3–1.2)
Total Protein: 7.2 g/dL (ref 6.0–8.3)

## 2011-04-08 LAB — CK TOTAL AND CKMB (NOT AT ARMC)
CK, MB: 1.9 ng/mL (ref 0.3–4.0)
Relative Index: INVALID (ref 0.0–2.5)
Total CK: 83 U/L (ref 7–177)

## 2011-04-08 MED ORDER — CYCLOBENZAPRINE HCL 10 MG PO TABS
10.0000 mg | ORAL_TABLET | Freq: Once | ORAL | Status: AC
  Administered 2011-04-08: 10 mg via ORAL

## 2011-04-08 MED ORDER — KETOROLAC TROMETHAMINE 60 MG/2ML IM SOLN
60.0000 mg | Freq: Once | INTRAMUSCULAR | Status: AC
  Administered 2011-04-08: 60 mg via INTRAMUSCULAR
  Filled 2011-04-08: qty 2

## 2011-04-08 MED ORDER — OXYCODONE-ACETAMINOPHEN 5-325 MG PO TABS
1.0000 | ORAL_TABLET | Freq: Once | ORAL | Status: AC
  Administered 2011-04-08: 1 via ORAL
  Filled 2011-04-08: qty 1

## 2011-04-08 MED ORDER — METHOCARBAMOL 500 MG PO TABS: 500.0000 mg | ORAL_TABLET | Freq: Once | ORAL | Status: DC

## 2011-04-08 MED ORDER — DEXAMETHASONE SODIUM PHOSPHATE 10 MG/ML IJ SOLN
10.0000 mg | Freq: Once | INTRAMUSCULAR | Status: AC
  Administered 2011-04-08: 10 mg via INTRAMUSCULAR
  Filled 2011-04-08: qty 1

## 2011-04-08 MED ORDER — CYCLOBENZAPRINE HCL 10 MG PO TABS
5.0000 mg | ORAL_TABLET | Freq: Once | ORAL | Status: DC
  Filled 2011-04-08: qty 1

## 2011-04-08 NOTE — Discharge Instructions (Signed)
Back Exercises Back exercises help treat and prevent back injuries. The goal of back exercises is to increase the strength of your abdominal and back muscles and the flexibility of your back. These exercises should be started when you no longer have back pain. Back exercises include:  Pelvic Tilt. Lie on your back with your knees bent. Tilt your pelvis until the lower part of your back is against the floor. Hold this position 5 to 10 sec and repeat 5 to 10 times.   Knee to Chest. Pull first 1 knee up against your chest and hold for 20 to 30 seconds, repeat this with the other knee, and then both knees. This may be done with the other leg straight or bent, whichever feels better.   Sit-Ups or Curl-Ups. Bend your knees 90 degrees. Start with tilting your pelvis, and do a partial, slow sit-up, lifting your trunk only 30 to 45 degrees off the floor. Take at least 2 to 3 seconds for each sit-up. Do not do sit-ups with your knees out straight. If partial sit-ups are difficult, simply do the above but with only tightening your abdominal muscles and holding it as directed.   Hip-Lift. Lie on your back with your knees flexed 90 degrees. Push down with your feet and shoulders as you raise your hips a couple inches off the floor; hold for 10 seconds, repeat 5 to 10 times.   Back arches. Lie on your stomach, propping yourself up on bent elbows. Slowly press on your hands, causing an arch in your low back. Repeat 3 to 5 times. Any initial stiffness and discomfort should lessen with repetition over time.   Shoulder-Lifts. Lie face down with arms beside your body. Keep hips and torso pressed to floor as you slowly lift your head and shoulders off the floor.  Do not overdo your exercises, especially in the beginning. Exercises may cause you some mild back discomfort which lasts for a few minutes; however, if the pain is more severe, or lasts for more than 15 minutes, do not continue exercises until you see your  caregiver. Improvement with exercise therapy for back problems is slow.  See your caregivers for assistance with developing a proper back exercise program. Document Released: 03/17/2004 Document Revised: 10/06/2010 Document Reviewed: 02/07/2005 [facility] Patient Information 2012 [place], [place].Chronic Back Pain When back pain lasts longer than 3 months, it is called chronic back pain.This pain can be frustrating, but the cause of the pain is rarely dangerous.People with chronic back pain often go through certain periods that are more intense (flare-ups). CAUSES Chronic back pain can be caused by wear and tear (degeneration) on different structures in your back. These structures may include bones, ligaments, or discs. This degeneration may result in more pressure being placed on the nerves that travel to your legs and feet. This can lead to pain traveling from the low back down the back of the legs. When pain lasts longer than 3 months, it is not unusual for people to experience anxiety or depression. Anxiety and depression can also contribute to low back pain. TREATMENT  Establish a regular exercise plan. This is critical to improving your functional level.   Have a self-management plan for when you flare-up. Flare-ups rarely require a medical visit. Regular exercise will help reduce the intensity and frequency of your flare-ups.   Manage how you feel about your back pain and the rest of your life. Anxiety, depression, and feeling that you cannot alter your back pain have  been shown to make back pain more intense and debilitating.   Medicines should never be your only treatment. They should be used along with other treatments to help you return to a more active lifestyle.   Procedures such as injections or surgery may be helpful but are rarely necessary. You may be able to get the same results with physical therapy or chiropractic care.  HOME CARE INSTRUCTIONS  Avoid bending, heavy lifting,  prolonged sitting, and activities which make the problem worse.   Continue normal activity as much as possible.   Take brief periods of rest throughout the day to reduce your pain during flare-ups.   Follow your back exercise rehabilitation program. This can help reduce symptoms and prevent more pain.   Only take over-the-counter or prescription medicines as directed by your caregiver. Muscle relaxants are sometimes prescribed. Narcotic pain medicine is discouraged for long-term pain, since addiction is a possible outcome.   If you smoke, quit.   Eat healthy foods and maintain a recommended body weight.  SEEK IMMEDIATE MEDICAL CARE IF:   You have weakness or numbness in one of your legs or feet.   You have trouble controlling your bladder or bowels.   You develop nausea, vomiting, abdominal pain, shortness of breath, or fainting.  Document Released: 03/17/2004 Document Revised: 10/20/2010 Document Reviewed: 06/28/2010 [facility] Patient Information 2012 [place], [place].   Chronic Pain Discharge Instructions  Emergency care providers appreciate that many patients coming to [place] are in severe pain and we wish to address their pain in the safest, most responsible manner.  It is important to recognize however, that the proper treatment of chronic pain differs from that of the pain of injuries and acute illnesses.  Our goal is to provide quality, safe, personalized care and we thank you for giving [place] the opportunity to serve you. The use of narcotics and related agents for chronic pain syndromes may lead to additional physical and psychological problems.  Nearly as many people die from prescription narcotics each year as die from car crashes.  Additionally, this risk is increased if such prescriptions are obtained from a variety of sources.  Therefore, only your primary care physician or a pain management specialist is able to safely treat such syndromes with narcotic medications long-term.     Documentation revealing such prescriptions have been sought from multiple sources may prohibit [place] from providing a refill or different narcotic medication.  Your name may be checked first through the [facility] Controlled Substances Reporting System.  This database is a record of controlled substance medication prescriptions that the patient has received.  This has been established by [facility] in an effort to eliminate the dangerous, and often life threatening, practice of obtaining multiple prescriptions from different medical providers.   If you have a chronic pain syndrome (i.e. chronic headaches, recurrent back or neck pain, dental pain, abdominal or pelvis pain without a specific diagnosis, or neuropathic pain such as fibromyalgia) or recurrent visits for the same condition without an acute diagnosis, you may be treated with non-narcotics and other non-addictive medicines.  Allergic reactions or negative side effects that may be reported by a patient to such medications will not typically lead to the use of a narcotic analgesic or other controlled substance as an alternative.   Patients managing chronic pain with a personal physician should have provisions in place for breakthrough pain.  If you are in crisis, you should call your physician.  If your physician directs you to the  emergency department, please have the doctor call and speak to our attending physician concerning your care.   When patients come to the Emergency Department (ED) with acute medical conditions in which the Emergency Department physician feels appropriate to prescribe narcotic or sedating pain medication, the physician will prescribe these in very limited quantities.  The amount of these medications will last only until you can see your primary care physician in his/her office.  Any patient who returns to the ED seeking refills should expect only non-narcotic pain medications.   In the event of an acute medical  condition exists and the emergency physician feels it is necessary that the patient be given a narcotic or sedating medication -  a responsible adult driver should be present in the room prior to the medication being given by the nurse.   Prescriptions for narcotic or sedating medications that have been lost, stolen or expired will not be refilled in the Emergency Department.    Patients who have chronic pain may receive non-narcotic prescriptions until seen by their primary care physician.  It is every patient's personal responsibility to maintain active prescriptions with his or her primary care physician or specialist. Chronic Pain Discharge Instructions  Emergency care providers appreciate that many patients coming to [place] are in severe pain and we wish to address their pain in the safest, most responsible manner.  It is important to recognize however, that the proper treatment of chronic pain differs from that of the pain of injuries and acute illnesses.  Our goal is to provide quality, safe, personalized care and we thank you for giving [place] the opportunity to serve you. The use of narcotics and related agents for chronic pain syndromes may lead to additional physical and psychological problems.  Nearly as many people die from prescription narcotics each year as die from car crashes.  Additionally, this risk is increased if such prescriptions are obtained from a variety of sources.  Therefore, only your primary care physician or a pain management specialist is able to safely treat such syndromes with narcotic medications long-term.    Documentation revealing such prescriptions have been sought from multiple sources may prohibit [place] from providing a refill or different narcotic medication.  Your name may be checked first through the [facility] Controlled Substances Reporting System.  This database is a record of controlled substance medication prescriptions that the patient has received.  This has been  established by [facility] in an effort to eliminate the dangerous, and often life threatening, practice of obtaining multiple prescriptions from different medical providers.   If you have a chronic pain syndrome (i.e. chronic headaches, recurrent back or neck pain, dental pain, abdominal or pelvis pain without a specific diagnosis, or neuropathic pain such as fibromyalgia) or recurrent visits for the same condition without an acute diagnosis, you may be treated with non-narcotics and other non-addictive medicines.  Allergic reactions or negative side effects that may be reported by a patient to such medications will not typically lead to the use of a narcotic analgesic or other controlled substance as an alternative.   Patients managing chronic pain with a personal physician should have provisions in place for breakthrough pain.  If you are in crisis, you should call your physician.  If your physician directs you to the emergency department, please have the doctor call and speak to our attending physician concerning your care.   When patients come to the Emergency Department (ED) with acute medical conditions in which the Emergency  Department physician feels appropriate to prescribe narcotic or sedating pain medication, the physician will prescribe these in very limited quantities.  The amount of these medications will last only until you can see your primary care physician in his/her office.  Any patient who returns to the ED seeking refills should expect only non-narcotic pain medications.   In the event of an acute medical condition exists and the emergency physician feels it is necessary that the patient be given a narcotic or sedating medication -  a responsible adult driver should be present in the room prior to the medication being given by the nurse.   Prescriptions for narcotic or sedating medications that have been lost, stolen or expired will not be refilled in the Emergency Department.     Patients who have chronic pain may receive non-narcotic prescriptions until seen by their primary care physician.  It is every patient's personal responsibility to maintain active prescriptions with his or her primary care physician or specialist.

## 2011-04-08 NOTE — ED Notes (Signed)
MD at bedside. 

## 2011-04-08 NOTE — ED Provider Notes (Signed)
History     CSN: [id number]  Arrival date & time 04/07/11  2251   First MD Initiated Contact with Patient 04/08/11 0137     HPI Patient reports history of back pain. 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 Reports that she usually gets some pain medication from her neurosurgeon states she was unable to have an appointment until next Thursday. States her pain is unbearable now she is barely able to move. Reports a history of several lumbar surgeries. States she has used Valium with minimal relief. Denies any new symptoms such as saddle anesthesias, perineal numbness, numbness, tingling, weakness, incontinence, abdominal pain, urinary symptoms, vaginal symptoms, fever Patient is a 51 y.o. female presenting with back pain. The history is provided by the patient.  Back Pain  This is a chronic problem. The problem occurs constantly. The problem has been gradually worsening. The pain is associated with lifting heavy objects and twisting. The pain is present in the lumbar spine. The quality of the pain is described as shooting and stabbing. The pain does not radiate. The pain is moderate. The symptoms are aggravated by bending, certain positions and twisting. Pertinent negatives include no chest pain, no numbness, no abdominal pain, no bowel incontinence, no perianal numbness, no bladder incontinence, no dysuria, no pelvic pain, no leg pain, no paresthesias, no paresis, no tingling and no weakness.    Past Medical History  Diagnosis Date  . Anxiety   . Depression   . Hypertension   . Chronic low back pain 1991    disk s/p 4 diskectomies, 5th fursion, then spinal cord stimulator ([place])    Past Surgical History  Procedure Date  . Spine surgery     Family History  Problem Relation Age of Onset  . Stroke Father   . Hypertension Father     History  Substance Use Topics  . Smoking status: Current Everyday Smoker -- 1.0 packs/day  for 30 years    Types: Cigarettes  . Smokeless tobacco: Never Used   Comment: prior trial of zyban made her feel weird   . Alcohol Use: No    OB History    Grav Para Term Preterm Abortions TAB SAB Ect Mult Living                  Review of Systems  Cardiovascular: Negative for chest pain.  Gastrointestinal: Negative for abdominal pain and bowel incontinence.  Genitourinary: Negative for bladder incontinence, dysuria and pelvic pain.  Musculoskeletal: Positive for back pain.  Neurological: Negative for tingling, weakness, numbness and paresthesias.    Allergies  Review of patient's allergies indicates no known allergies.  Home Medications   Current Outpatient Rx  Name Route Sig Dispense Refill  . DIAZEPAM 5 MG PO TABS Oral Take 1 tablet (5 mg total) by mouth every 12 (twelve) hours as needed for anxiety. 60 tablet 2  . FLUOXETINE HCL 40 MG PO CAPS Oral Take 1 capsule (40 mg total) by mouth daily. 90 capsule 3  . IBUPROFEN 200 MG PO TABS Oral Take 200 mg by mouth every 6 (six) hours as needed. For pain    . LANSOPRAZOLE 30 MG PO CPDR Oral Take 30 mg by mouth daily.      . OXYCODONE-ACETAMINOPHEN 10-325 MG PO TABS Oral Take 1 tablet by mouth every 4 (four) hours as needed. For pain    . VALSARTAN-HYDROCHLOROTHIAZIDE 160-12.5 MG PO TABS  TAKE 1 TABLET BY  MOUTH EVERY DAY 30 tablet 1    BP 109/68  Pulse 79  Temp(Src) 97.8 F (36.6 C) (Oral)  Resp 18  SpO2 98%  Physical Exam  Vitals reviewed. Constitutional: She is oriented to person, place, and time. Vital signs are normal. She appears well-developed and well-nourished.  HENT:  Head: Normocephalic and atraumatic.  Eyes: Conjunctivae are normal. Pupils are equal, round, and reactive to light.  Neck: Normal range of motion. Neck supple.  Cardiovascular: Normal rate, regular rhythm and normal heart sounds.  Exam reveals no friction rub.   No murmur heard. Pulmonary/Chest: Effort normal and breath sounds normal. She has no  wheezes. She has no rhonchi. She has no rales. She exhibits no tenderness.  Musculoskeletal: Normal range of motion.       Lumbar back: She exhibits tenderness, pain and spasm. She exhibits normal range of motion, no swelling and normal pulse.       Back:       Patient's negative straight leg raise. Full range of motion of lower hips and knees. normal distal pulses.  Neurological: She is alert and oriented to person, place, and time. Coordination normal.  Skin: Skin is warm and dry. No rash noted. No erythema. No pallor.    ED Course  Procedures    1. Chronic back pain   2. Back pain       MDM   Prior charts reviewed. Patient was seen by Dr. Darrick Huntsman.  she states that she does not want to prescribe further narcotic pain medication for patient. Will honor this and give patient medication here in the ED for acute pain. I did advise followup with her doctor for further pain control if needed. Patient voices understanding and is ready for discharge.    Thomasene Lot, PA-C 04/08/11 [phone]

## 2011-04-08 NOTE — ED Provider Notes (Signed)
Medical screening examination/treatment/procedure(s) were performed by non-physician practitioner and as supervising physician I was immediately available for consultation/collaboration.   Nat Christen, MD 04/08/11 [phone]

## 2011-04-08 NOTE — ED Notes (Signed)
Pt states she has had several back surgeries and is now complaining of mid upper back pain. Reports moving father around a lot and is causing increase in pain. Scheduled for MRI. Has previously take Percocet for same but unable to get into doctor. Pt reports earlier she became anxious and her chest hurt. Pt now appears in no acute distress. Skin is warm and dry. Resp are unlabored. Will monitor.

## 2011-04-09 ENCOUNTER — Ambulatory Visit
Admission: RE | Admit: 2011-04-09 | Discharge: 2011-04-09 | Disposition: A | Discharge status: Home / Self Care | Payer: Medicare Other | Source: Ambulatory Visit | Attending: Neurosurgery | Admitting: Neurosurgery

## 2011-04-09 DIAGNOSIS — M5137 Other intervertebral disc degeneration, lumbosacral region: Secondary | ICD-10-CM | POA: Diagnosis not present

## 2011-04-09 DIAGNOSIS — M5126 Other intervertebral disc displacement, lumbar region: Secondary | ICD-10-CM | POA: Diagnosis not present

## 2011-04-09 DIAGNOSIS — M47817 Spondylosis without myelopathy or radiculopathy, lumbosacral region: Secondary | ICD-10-CM | POA: Diagnosis not present

## 2011-04-09 DIAGNOSIS — M48061 Spinal stenosis, lumbar region without neurogenic claudication: Secondary | ICD-10-CM

## 2011-04-09 MED ORDER — GADOBENATE DIMEGLUMINE 529 MG/ML IV SOLN
17.0000 mL | Freq: Once | INTRAVENOUS | Status: AC | PRN
  Administered 2011-04-09: 17 mL via INTRAVENOUS

## 2011-04-13 DIAGNOSIS — J209 Acute bronchitis, unspecified: Secondary | ICD-10-CM | POA: Diagnosis not present

## 2011-04-13 DIAGNOSIS — M545 Low back pain: Secondary | ICD-10-CM | POA: Diagnosis not present

## 2011-04-14 ENCOUNTER — Encounter: Payer: Medicare Other | Admitting: Internal Medicine

## 2011-04-16 DIAGNOSIS — M545 Low back pain: Secondary | ICD-10-CM | POA: Diagnosis not present

## 2011-04-16 DIAGNOSIS — J209 Acute bronchitis, unspecified: Secondary | ICD-10-CM | POA: Diagnosis not present

## 2011-04-18 ENCOUNTER — Other Ambulatory Visit: Payer: Self-pay | Admitting: Neurosurgery

## 2011-04-18 ENCOUNTER — Encounter (HOSPITAL_COMMUNITY): Payer: Self-pay | Admitting: Pharmacy Technician

## 2011-04-18 DIAGNOSIS — M47817 Spondylosis without myelopathy or radiculopathy, lumbosacral region: Secondary | ICD-10-CM | POA: Diagnosis not present

## 2011-04-18 DIAGNOSIS — M48061 Spinal stenosis, lumbar region without neurogenic claudication: Secondary | ICD-10-CM | POA: Diagnosis not present

## 2011-04-20 ENCOUNTER — Ambulatory Visit: Payer: Medicare Other | Admitting: Psychiatry

## 2011-04-21 ENCOUNTER — Ambulatory Visit: Payer: Medicare Other | Admitting: Internal Medicine

## 2011-04-21 DIAGNOSIS — Z0279 Encounter for issue of other medical certificate: Secondary | ICD-10-CM

## 2011-04-22 ENCOUNTER — Encounter (HOSPITAL_COMMUNITY): Payer: Self-pay

## 2011-04-22 ENCOUNTER — Encounter (HOSPITAL_COMMUNITY)
Admission: RE | Admit: 2011-04-22 | Discharge: 2011-04-22 | Disposition: A | Discharge status: Home / Self Care | Payer: Medicare Other | Source: Ambulatory Visit | Attending: Neurosurgery | Admitting: Neurosurgery

## 2011-04-22 HISTORY — DX: Unspecified osteoarthritis, unspecified site: M19.90

## 2011-04-22 HISTORY — DX: Gastro-esophageal reflux disease without esophagitis: K21.9

## 2011-04-22 LAB — SURGICAL PCR SCREEN
MRSA, PCR: NEGATIVE
Staphylococcus aureus: NEGATIVE

## 2011-04-22 LAB — CBC
HCT: 39.1 % (ref 36.0–46.0)
Hemoglobin: 13.2 g/dL (ref 12.0–15.0)
MCH: 30.2 pg (ref 26.0–34.0)
MCHC: 33.8 g/dL (ref 30.0–36.0)
MCV: 89.5 fL (ref 78.0–100.0)
Platelets: 449 10*3/uL — ABNORMAL HIGH (ref 150–400)
RBC: 4.37 MIL/uL (ref 3.87–5.11)
RDW: 15.7 % — ABNORMAL HIGH (ref 11.5–15.5)
WBC: 8.9 10*3/uL (ref 4.0–10.5)

## 2011-04-22 LAB — TYPE AND SCREEN
ABO/RH(D): A POS
Antibody Screen: NEGATIVE

## 2011-04-22 LAB — HCG, SERUM, QUALITATIVE: Preg, Serum: NEGATIVE

## 2011-04-22 LAB — BASIC METABOLIC PANEL
BUN: 17 mg/dL (ref 6–23)
CO2: 24 mEq/L (ref 19–32)
Calcium: 10 mg/dL (ref 8.4–10.5)
Chloride: 104 mEq/L (ref 96–112)
Creatinine, Ser: 0.85 mg/dL (ref 0.50–1.10)
GFR calc Af Amer: 90 mL/min — ABNORMAL LOW (ref >90)
GFR calc non Af Amer: 77 mL/min — ABNORMAL LOW (ref >90)
Glucose, Bld: 90 mg/dL (ref 70–99)
Potassium: 4 mEq/L (ref 3.5–5.1)
Sodium: 137 mEq/L (ref 135–145)

## 2011-04-22 NOTE — Pre-Procedure Instructions (Addendum)
20 RIDDHI GRETHER  04/22/2011   Your procedure is scheduled on:  3.6.13  Report to [facility] at 630* AM.  Call this number if you have problems the morning of surgery: [phone]   Remember:   Do not eat food:After Midnight.  May have clear liquids: up to 4 Hours before arrival. 230 am  Clear liquids include soda, tea, black coffee, apple or grape juice, broth.  Take these medicines the morning of surgery with A SIP OF WATER: valium,prozac, prevacid,percocet   STOP  Ibuprofen, any aspirin, herbal meds, blood thinners   Do not wear jewelry, make-up or nail polish.  Do not wear lotions, powders, or perfumes. You may wear deodorant.  Do not shave 48 hours prior to surgery.  Do not bring valuables to the hospital.  Contacts, dentures or bridgework may not be worn into surgery.  Leave suitcase in the car. After surgery it may be brought to your room.  For patients admitted to the hospital, checkout time is 11:00 AM the day of discharge.   Patients discharged the day of surgery will not be allowed to drive home.  Name and phone number of your driver: dtr shannon Fowles  [phone]  Special Instructions: CHG Shower Use Special Wash: 1/2 bottle night before surgery and 1/2 bottle morning of surgery.   Please read over the following fact sheets that you were given: Pain Booklet, Coughing and Deep Breathing, Blood Transfusion Information, MRSA Information and Surgical Site Infection Prevention

## 2011-04-22 NOTE — Progress Notes (Addendum)
Patient has abnormal ekg ..,04/07/11, 11/07/10,  Essentially normal one from 11/18/09 on chart.  No workup  ever done other than ekg and listening to heart per patient., Prior surgeries, last 7.12 without problems. pmp [place] in [place] dr Darrick Huntsman.notes  From 1/13 in epic

## 2011-04-25 DIAGNOSIS — F411 Generalized anxiety disorder: Secondary | ICD-10-CM | POA: Diagnosis not present

## 2011-04-25 DIAGNOSIS — M545 Low back pain: Secondary | ICD-10-CM | POA: Diagnosis not present

## 2011-04-25 NOTE — Consult Note (Signed)
Anesthesia:  Patient is a 51 year female scheduled for L3-4 PLIF on 04/27/11.  History includes anxiety, depression, HTN, GERD, arthritis, smoking, and previous lumbar fusion in 2011 surgery.  CXR from 11/07/10 and pre-operative labs noted.  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  It does not appear significantly changed since 11/16/10 or from 11/05/08.  No CV symptoms reported at PAT.  If remains, asymptomatic then anticipate she can proceed.

## 2011-04-26 MED ORDER — CEFAZOLIN SODIUM 1-5 GM-% IV SOLN: 1.0000 g | INTRAVENOUS | Status: DC

## 2011-04-27 ENCOUNTER — Encounter (HOSPITAL_COMMUNITY): Payer: Self-pay | Admitting: General Practice

## 2011-04-27 ENCOUNTER — Encounter (HOSPITAL_COMMUNITY): Payer: Self-pay | Admitting: *Deleted

## 2011-04-27 ENCOUNTER — Inpatient Hospital Stay (HOSPITAL_COMMUNITY)
Admission: RE | Admit: 2011-04-27 | Discharge: 2011-05-01 | DRG: 460 | Disposition: A | Discharge status: Home / Self Care | Payer: Medicare Other | Source: Ambulatory Visit | Attending: Neurosurgery | Admitting: Neurosurgery

## 2011-04-27 ENCOUNTER — Inpatient Hospital Stay (HOSPITAL_COMMUNITY): Payer: Medicare Other

## 2011-04-27 ENCOUNTER — Encounter (HOSPITAL_COMMUNITY): Payer: Self-pay | Admitting: Vascular Surgery

## 2011-04-27 ENCOUNTER — Encounter (HOSPITAL_COMMUNITY)
Admission: RE | Disposition: A | Discharge status: Home / Self Care | Payer: Self-pay | Source: Ambulatory Visit | Attending: Neurosurgery

## 2011-04-27 ENCOUNTER — Inpatient Hospital Stay (HOSPITAL_COMMUNITY): Payer: Medicare Other | Admitting: Vascular Surgery

## 2011-04-27 DIAGNOSIS — I10 Essential (primary) hypertension: Secondary | ICD-10-CM | POA: Diagnosis present

## 2011-04-27 DIAGNOSIS — Z981 Arthrodesis status: Secondary | ICD-10-CM | POA: Diagnosis not present

## 2011-04-27 DIAGNOSIS — M47816 Spondylosis without myelopathy or radiculopathy, lumbar region: Secondary | ICD-10-CM | POA: Diagnosis present

## 2011-04-27 DIAGNOSIS — Z79899 Other long term (current) drug therapy: Secondary | ICD-10-CM | POA: Diagnosis not present

## 2011-04-27 DIAGNOSIS — M129 Arthropathy, unspecified: Secondary | ICD-10-CM | POA: Diagnosis present

## 2011-04-27 DIAGNOSIS — Z6828 Body mass index (BMI) 28.0-28.9, adult: Secondary | ICD-10-CM | POA: Diagnosis not present

## 2011-04-27 DIAGNOSIS — K219 Gastro-esophageal reflux disease without esophagitis: Secondary | ICD-10-CM | POA: Diagnosis present

## 2011-04-27 DIAGNOSIS — M47817 Spondylosis without myelopathy or radiculopathy, lumbosacral region: Principal | ICD-10-CM | POA: Diagnosis present

## 2011-04-27 DIAGNOSIS — F411 Generalized anxiety disorder: Secondary | ICD-10-CM | POA: Diagnosis present

## 2011-04-27 DIAGNOSIS — F329 Major depressive disorder, single episode, unspecified: Secondary | ICD-10-CM | POA: Diagnosis present

## 2011-04-27 DIAGNOSIS — Z01812 Encounter for preprocedural laboratory examination: Secondary | ICD-10-CM

## 2011-04-27 DIAGNOSIS — F3289 Other specified depressive episodes: Secondary | ICD-10-CM | POA: Diagnosis present

## 2011-04-27 DIAGNOSIS — M545 Low back pain: Secondary | ICD-10-CM | POA: Diagnosis not present

## 2011-04-27 DIAGNOSIS — M48061 Spinal stenosis, lumbar region without neurogenic claudication: Secondary | ICD-10-CM | POA: Diagnosis not present

## 2011-04-27 SURGERY — POSTERIOR LUMBAR FUSION 1 LEVEL
Anesthesia: General | Wound class: Clean

## 2011-04-27 MED ORDER — PHENOL 1.4 % MT LIQD: 1.0000 | OROMUCOSAL | Status: DC | PRN

## 2011-04-27 MED ORDER — HYDROMORPHONE HCL PF 1 MG/ML IJ SOLN
INTRAMUSCULAR | Status: AC
  Filled 2011-04-27: qty 1

## 2011-04-27 MED ORDER — PROMETHAZINE HCL 25 MG/ML IJ SOLN: 6.2500 mg | INTRAMUSCULAR | Status: DC | PRN

## 2011-04-27 MED ORDER — DOCUSATE SODIUM 100 MG PO CAPS
100.0000 mg | ORAL_CAPSULE | Freq: Two times a day (BID) | ORAL | Status: DC
  Administered 2011-04-27 – 2011-05-01 (×8): 100 mg via ORAL
  Filled 2011-04-27 (×7): qty 1

## 2011-04-27 MED ORDER — ROCURONIUM BROMIDE 100 MG/10ML IV SOLN
INTRAVENOUS | Status: DC | PRN
  Administered 2011-04-27: 50 mg via INTRAVENOUS

## 2011-04-27 MED ORDER — HYDROCHLOROTHIAZIDE 12.5 MG PO CAPS
12.5000 mg | ORAL_CAPSULE | Freq: Every day | ORAL | Status: DC
  Administered 2011-04-30: 12.5 mg via ORAL
  Filled 2011-04-27 (×5): qty 1

## 2011-04-27 MED ORDER — DEXAMETHASONE SODIUM PHOSPHATE 4 MG/ML IJ SOLN
INTRAMUSCULAR | Status: DC | PRN
  Administered 2011-04-27: 4 mg via INTRAVENOUS

## 2011-04-27 MED ORDER — MENTHOL 3 MG MT LOZG: 1.0000 | LOZENGE | OROMUCOSAL | Status: DC | PRN

## 2011-04-27 MED ORDER — DIPHENHYDRAMINE HCL 50 MG/ML IJ SOLN: 12.5000 mg | Freq: Four times a day (QID) | INTRAMUSCULAR | Status: DC | PRN

## 2011-04-27 MED ORDER — VECURONIUM BROMIDE 10 MG IV SOLR
INTRAVENOUS | Status: DC | PRN
  Administered 2011-04-27: 1 mg via INTRAVENOUS
  Administered 2011-04-27: 2 mg via INTRAVENOUS
  Administered 2011-04-27 (×4): 1 mg via INTRAVENOUS
  Administered 2011-04-27: 3 mg via INTRAVENOUS

## 2011-04-27 MED ORDER — PHENYLEPHRINE HCL 10 MG/ML IJ SOLN
INTRAMUSCULAR | Status: DC | PRN
  Administered 2011-04-27 (×2): 40 ug via INTRAVENOUS

## 2011-04-27 MED ORDER — SODIUM CHLORIDE 0.9 % IJ SOLN: 9.0000 mL | INTRAMUSCULAR | Status: DC | PRN

## 2011-04-27 MED ORDER — GLYCOPYRROLATE 0.2 MG/ML IJ SOLN
INTRAMUSCULAR | Status: DC | PRN
  Administered 2011-04-27: .6 mg via INTRAVENOUS

## 2011-04-27 MED ORDER — CEFAZOLIN SODIUM-DEXTROSE 2-3 GM-% IV SOLR
INTRAVENOUS | Status: AC
  Filled 2011-04-27: qty 50

## 2011-04-27 MED ORDER — DIPHENHYDRAMINE HCL 12.5 MG/5ML PO ELIX: 12.5000 mg | ORAL_SOLUTION | Freq: Four times a day (QID) | ORAL | Status: DC | PRN

## 2011-04-27 MED ORDER — NALOXONE HCL 0.4 MG/ML IJ SOLN: 0.4000 mg | INTRAMUSCULAR | Status: DC | PRN

## 2011-04-27 MED ORDER — THROMBIN 20000 UNITS EX KIT
PACK | CUTANEOUS | Status: DC | PRN
  Administered 2011-04-27: 08:00:00 via TOPICAL

## 2011-04-27 MED ORDER — CEFAZOLIN SODIUM-DEXTROSE 2-3 GM-% IV SOLR
2.0000 g | INTRAVENOUS | Status: AC
  Administered 2011-04-27: 2 g via INTRAVENOUS

## 2011-04-27 MED ORDER — CEFAZOLIN SODIUM 1-5 GM-% IV SOLN
1.0000 g | Freq: Three times a day (TID) | INTRAVENOUS | Status: AC
  Administered 2011-04-27 – 2011-04-28 (×2): 1 g via INTRAVENOUS
  Filled 2011-04-27 (×2): qty 50

## 2011-04-27 MED ORDER — SODIUM CHLORIDE 0.9 % IJ SOLN
3.0000 mL | Freq: Two times a day (BID) | INTRAMUSCULAR | Status: DC
  Administered 2011-04-28 – 2011-04-30 (×6): 3 mL via INTRAVENOUS

## 2011-04-27 MED ORDER — OXYCODONE-ACETAMINOPHEN 5-325 MG PO TABS
1.0000 | ORAL_TABLET | ORAL | Status: DC | PRN
  Administered 2011-04-28 – 2011-04-29 (×7): 2 via ORAL
  Administered 2011-04-29: 1 via ORAL
  Administered 2011-04-29 – 2011-04-30 (×3): 2 via ORAL
  Administered 2011-04-30: 1 via ORAL
  Administered 2011-04-30: 2 via ORAL
  Administered 2011-04-30: 1 via ORAL
  Administered 2011-04-30 – 2011-05-01 (×2): 2 via ORAL
  Administered 2011-05-01: 1 via ORAL
  Administered 2011-05-01: 2 via ORAL
  Filled 2011-04-27 (×18): qty 2

## 2011-04-27 MED ORDER — PANTOPRAZOLE SODIUM 20 MG PO TBEC
20.0000 mg | DELAYED_RELEASE_TABLET | Freq: Every day | ORAL | Status: DC
  Administered 2011-04-28 – 2011-05-01 (×4): 20 mg via ORAL
  Filled 2011-04-27 (×4): qty 1

## 2011-04-27 MED ORDER — LACTATED RINGERS IV SOLN
INTRAVENOUS | Status: DC | PRN
  Administered 2011-04-27 (×3): via INTRAVENOUS

## 2011-04-27 MED ORDER — NEOSTIGMINE METHYLSULFATE 1 MG/ML IJ SOLN
INTRAMUSCULAR | Status: DC | PRN
  Administered 2011-04-27: 4 mg via INTRAVENOUS

## 2011-04-27 MED ORDER — DIAZEPAM 5 MG PO TABS
5.0000 mg | ORAL_TABLET | Freq: Four times a day (QID) | ORAL | Status: DC | PRN
  Administered 2011-04-27 – 2011-04-30 (×5): 5 mg via ORAL
  Filled 2011-04-27 (×5): qty 1

## 2011-04-27 MED ORDER — ALBUTEROL SULFATE (5 MG/ML) 0.5% IN NEBU
2.5000 mg | INHALATION_SOLUTION | Freq: Four times a day (QID) | RESPIRATORY_TRACT | Status: DC | PRN
  Administered 2011-04-27: 2.5 mg via RESPIRATORY_TRACT
  Filled 2011-04-27: qty 0.5

## 2011-04-27 MED ORDER — ONDANSETRON HCL 4 MG/2ML IJ SOLN: 4.0000 mg | Freq: Four times a day (QID) | INTRAMUSCULAR | Status: DC | PRN

## 2011-04-27 MED ORDER — ZOLPIDEM TARTRATE 10 MG PO TABS
10.0000 mg | ORAL_TABLET | Freq: Every evening | ORAL | Status: DC | PRN
  Administered 2011-04-30: 10 mg via ORAL
  Filled 2011-04-27 (×2): qty 1

## 2011-04-27 MED ORDER — MEPERIDINE HCL 25 MG/ML IJ SOLN: 6.2500 mg | INTRAMUSCULAR | Status: DC | PRN

## 2011-04-27 MED ORDER — HYDROMORPHONE 0.3 MG/ML IV SOLN
INTRAVENOUS | Status: AC
  Filled 2011-04-27: qty 25

## 2011-04-27 MED ORDER — ALBUMIN HUMAN 5 % IV SOLN
INTRAVENOUS | Status: DC | PRN
  Administered 2011-04-27: 13:00:00 via INTRAVENOUS

## 2011-04-27 MED ORDER — HYDROMORPHONE HCL PF 1 MG/ML IJ SOLN
0.2500 mg | INTRAMUSCULAR | Status: DC | PRN
  Administered 2011-04-27 (×4): 0.5 mg via INTRAVENOUS

## 2011-04-27 MED ORDER — FENTANYL CITRATE 0.05 MG/ML IJ SOLN
INTRAMUSCULAR | Status: DC | PRN
  Administered 2011-04-27 (×5): 50 ug via INTRAVENOUS
  Administered 2011-04-27: 100 ug via INTRAVENOUS
  Administered 2011-04-27 (×4): 50 ug via INTRAVENOUS

## 2011-04-27 MED ORDER — ONDANSETRON HCL 4 MG/2ML IJ SOLN
INTRAMUSCULAR | Status: DC | PRN
  Administered 2011-04-27: 4 mg via INTRAVENOUS

## 2011-04-27 MED ORDER — ONDANSETRON HCL 4 MG/2ML IJ SOLN: 4.0000 mg | INTRAMUSCULAR | Status: DC | PRN

## 2011-04-27 MED ORDER — HEMOSTATIC AGENTS (NO CHARGE) OPTIME
TOPICAL | Status: DC | PRN
  Administered 2011-04-27: 1 via TOPICAL

## 2011-04-27 MED ORDER — MORPHINE SULFATE 10 MG/ML IJ SOLN
INTRAMUSCULAR | Status: DC | PRN
  Administered 2011-04-27: 2 mg via INTRAVENOUS
  Administered 2011-04-27: 3 mg via INTRAVENOUS
  Administered 2011-04-27: 2 mg via INTRAVENOUS
  Administered 2011-04-27: 3 mg via INTRAVENOUS

## 2011-04-27 MED ORDER — ALBUTEROL SULFATE HFA 108 (90 BASE) MCG/ACT IN AERS
INHALATION_SPRAY | RESPIRATORY_TRACT | Status: DC | PRN
  Administered 2011-04-27 (×2): 2 via RESPIRATORY_TRACT

## 2011-04-27 MED ORDER — HYDROMORPHONE 0.3 MG/ML IV SOLN
INTRAVENOUS | Status: AC
  Administered 2011-04-27: 16:00:00
  Filled 2011-04-27: qty 25

## 2011-04-27 MED ORDER — PHENYLEPHRINE HCL 10 MG/ML IJ SOLN
10.0000 mg | INTRAVENOUS | Status: DC | PRN
  Administered 2011-04-27: 10 ug/min via INTRAVENOUS

## 2011-04-27 MED ORDER — POTASSIUM CHLORIDE IN NACL 20-0.9 MEQ/L-% IV SOLN
INTRAVENOUS | Status: DC
  Administered 2011-04-27 – 2011-04-28 (×2): via INTRAVENOUS
  Filled 2011-04-27 (×9): qty 1000

## 2011-04-27 MED ORDER — HYDROCODONE-ACETAMINOPHEN 5-325 MG PO TABS: 1.0000 | ORAL_TABLET | ORAL | Status: DC | PRN

## 2011-04-27 MED ORDER — ACETAMINOPHEN 325 MG PO TABS: 650.0000 mg | ORAL_TABLET | ORAL | Status: DC | PRN

## 2011-04-27 MED ORDER — SODIUM CHLORIDE 0.9 % IJ SOLN: 3.0000 mL | INTRAMUSCULAR | Status: DC | PRN

## 2011-04-27 MED ORDER — FLUOXETINE HCL 20 MG PO CAPS
40.0000 mg | ORAL_CAPSULE | Freq: Every day | ORAL | Status: DC
  Administered 2011-04-28 – 2011-05-01 (×4): 40 mg via ORAL
  Filled 2011-04-27 (×4): qty 2

## 2011-04-27 MED ORDER — IRBESARTAN 150 MG PO TABS
150.0000 mg | ORAL_TABLET | Freq: Every day | ORAL | Status: DC
  Administered 2011-04-30: 150 mg via ORAL
  Filled 2011-04-27 (×5): qty 1

## 2011-04-27 MED ORDER — HYDROMORPHONE 0.3 MG/ML IV SOLN
INTRAVENOUS | Status: DC
  Administered 2011-04-27: 3 mg via INTRAVENOUS
  Administered 2011-04-27 – 2011-04-28 (×2): via INTRAVENOUS

## 2011-04-27 MED ORDER — MIDAZOLAM HCL 5 MG/5ML IJ SOLN
INTRAMUSCULAR | Status: DC | PRN
  Administered 2011-04-27: 2 mg via INTRAVENOUS

## 2011-04-27 MED ORDER — ACETAMINOPHEN 650 MG RE SUPP: 650.0000 mg | RECTAL | Status: DC | PRN

## 2011-04-27 MED ORDER — SODIUM CHLORIDE 0.9 % IV SOLN: 250.0000 mL | INTRAVENOUS | Status: DC

## 2011-04-27 MED ORDER — VALSARTAN-HYDROCHLOROTHIAZIDE 160-12.5 MG PO TABS: 1.0000 | ORAL_TABLET | Freq: Every day | ORAL | Status: DC

## 2011-04-27 MED ORDER — 0.9 % SODIUM CHLORIDE (POUR BTL) OPTIME
TOPICAL | Status: DC | PRN
  Administered 2011-04-27: 1000 mL

## 2011-04-27 MED ORDER — PROPOFOL 10 MG/ML IV EMUL
INTRAVENOUS | Status: DC | PRN
  Administered 2011-04-27: 200 mg via INTRAVENOUS

## 2011-04-27 MED ORDER — ALUM & MAG HYDROXIDE-SIMETH 200-200-20 MG/5ML PO SUSP
30.0000 mL | Freq: Four times a day (QID) | ORAL | Status: DC | PRN
  Administered 2011-04-28: 30 mL via ORAL
  Filled 2011-04-27: qty 30

## 2011-04-27 MED ORDER — FLUOXETINE HCL 40 MG PO CAPS: 40.0000 mg | ORAL_CAPSULE | Freq: Every day | ORAL | Status: DC

## 2011-04-27 SURGICAL SUPPLY — 66 items
BAG DECANTER FOR FLEXI CONT (MISCELLANEOUS) ×2 IMPLANT
BENZOIN TINCTURE PRP APPL 2/3 (GAUZE/BANDAGES/DRESSINGS) IMPLANT
BLADE SURG ROTATE 9660 (MISCELLANEOUS) IMPLANT
BUR MATCHSTICK NEURO 3.0 LAGG (BURR) ×2 IMPLANT
BUR ROUND FLUTED 5 RND (BURR) ×2 IMPLANT
CAGE 11MM (Cage) ×4 IMPLANT
CANISTER SUCTION 2500CC (MISCELLANEOUS) ×2 IMPLANT
CLOTH BEACON ORANGE TIMEOUT ST (SAFETY) ×2 IMPLANT
CONNECTOR AX 5.5-5.5 (Connector) ×4 IMPLANT
CONT SPEC 4OZ CLIKSEAL STRL BL (MISCELLANEOUS) ×2 IMPLANT
COVER BACK TABLE 24X17X13 BIG (DRAPES) IMPLANT
COVER TABLE BACK 60X90 (DRAPES) ×2 IMPLANT
DECANTER SPIKE VIAL GLASS SM (MISCELLANEOUS) ×2 IMPLANT
DERMABOND ADVANCED (GAUZE/BANDAGES/DRESSINGS) ×1
DERMABOND ADVANCED .7 DNX12 (GAUZE/BANDAGES/DRESSINGS) ×1 IMPLANT
DRAPE C-ARM 42X72 X-RAY (DRAPES) ×8 IMPLANT
DRAPE LAPAROTOMY 100X72X124 (DRAPES) ×2 IMPLANT
DRAPE POUCH INSTRU U-SHP 10X18 (DRAPES) ×2 IMPLANT
DRAPE SURG 17X23 STRL (DRAPES) ×2 IMPLANT
DRESSING TELFA 8X3 (GAUZE/BANDAGES/DRESSINGS) IMPLANT
DURAPREP 26ML APPLICATOR (WOUND CARE) ×2 IMPLANT
ELECT REM PT RETURN 9FT ADLT (ELECTROSURGICAL) ×2
ELECTRODE REM PT RTRN 9FT ADLT (ELECTROSURGICAL) ×1 IMPLANT
GAUZE SPONGE 4X4 16PLY XRAY LF (GAUZE/BANDAGES/DRESSINGS) IMPLANT
GLOVE BIOGEL PI IND STRL 8 (GLOVE) ×4 IMPLANT
GLOVE BIOGEL PI IND STRL 8.5 (GLOVE) ×1 IMPLANT
GLOVE BIOGEL PI INDICATOR 8 (GLOVE) ×4
GLOVE BIOGEL PI INDICATOR 8.5 (GLOVE) ×1
GLOVE ECLIPSE 6.5 STRL STRAW (GLOVE) ×4 IMPLANT
GLOVE ECLIPSE 7.5 STRL STRAW (GLOVE) ×2 IMPLANT
GLOVE EXAM NITRILE LRG STRL (GLOVE) IMPLANT
GLOVE EXAM NITRILE MD LF STRL (GLOVE) ×2 IMPLANT
GLOVE EXAM NITRILE XL STR (GLOVE) IMPLANT
GLOVE EXAM NITRILE XS STR PU (GLOVE) IMPLANT
GLOVE INDICATOR 7.5 STRL GRN (GLOVE) ×4 IMPLANT
GLOVE INDICATOR 8.0 STRL GRN (GLOVE) ×6 IMPLANT
GOWN BRE IMP SLV AUR LG STRL (GOWN DISPOSABLE) ×4 IMPLANT
GOWN BRE IMP SLV AUR XL STRL (GOWN DISPOSABLE) ×2 IMPLANT
GOWN STRL REIN 2XL LVL4 (GOWN DISPOSABLE) ×6 IMPLANT
KIT BASIN OR (CUSTOM PROCEDURE TRAY) ×2 IMPLANT
KIT POSITION SURG JACKSON T1 (MISCELLANEOUS) ×2 IMPLANT
KIT ROOM TURNOVER OR (KITS) ×2 IMPLANT
MILL MEDIUM DISP (BLADE) ×2 IMPLANT
NEEDLE HYPO 25X1 1.5 SAFETY (NEEDLE) ×2 IMPLANT
NEEDLE SPNL 18GX3.5 QUINCKE PK (NEEDLE) ×2 IMPLANT
NS IRRIG 1000ML POUR BTL (IV SOLUTION) ×2 IMPLANT
PACK LAMINECTOMY NEURO (CUSTOM PROCEDURE TRAY) ×2 IMPLANT
PAD ARMBOARD 7.5X6 YLW CONV (MISCELLANEOUS) ×6 IMPLANT
ROD TI 5.5MM 6CM (Rod) ×4 IMPLANT
SCREW 35MM PEDICLE (Screw) ×4 IMPLANT
SCREW LOCK 5.5MM ROD (Screw) ×8 IMPLANT
SCREW POLYAX 6.5X45MM (Screw) ×2 IMPLANT
SCREW SET SPINAL STD HEXALOBE (Screw) ×14 IMPLANT
SPONGE GAUZE 4X4 12PLY (GAUZE/BANDAGES/DRESSINGS) IMPLANT
SPONGE LAP 4X18 X RAY DECT (DISPOSABLE) IMPLANT
SPONGE SURGIFOAM ABS GEL 100 (HEMOSTASIS) ×2 IMPLANT
STRIP CLOSURE SKIN 1/2X4 (GAUZE/BANDAGES/DRESSINGS) IMPLANT
SUT PROLENE 6 0 BV (SUTURE) IMPLANT
SUT VIC AB 0 CT1 18XCR BRD8 (SUTURE) ×2 IMPLANT
SUT VIC AB 0 CT1 8-18 (SUTURE) ×2
SUT VIC AB 2-0 CT1 18 (SUTURE) ×4 IMPLANT
SUT VIC AB 3-0 SH 8-18 (SUTURE) ×6 IMPLANT
SYR 20ML ECCENTRIC (SYRINGE) ×2 IMPLANT
TOWEL OR 17X24 6PK STRL BLUE (TOWEL DISPOSABLE) ×2 IMPLANT
TOWEL OR 17X26 10 PK STRL BLUE (TOWEL DISPOSABLE) ×2 IMPLANT
WATER STERILE IRR 1000ML POUR (IV SOLUTION) ×2 IMPLANT

## 2011-04-27 NOTE — Anesthesia Postprocedure Evaluation (Signed)
  Anesthesia Post-op Note  Patient: Madeline Dickerson  Procedure(s) Performed: Procedure(s) (LRB): POSTERIOR LUMBAR FUSION 1 LEVEL (N/A)  Patient Location: PACU  Anesthesia Type: General  Level of Consciousness: awake  Airway and Oxygen Therapy: Patient Spontanous Breathing  Post-op Pain: moderate  Post-op Assessment: Post-op Vital signs reviewed  Post-op Vital Signs: stable  Complications: No apparent anesthesia complications

## 2011-04-27 NOTE — Transfer of Care (Signed)
Immediate Anesthesia Transfer of Care Note  Patient: Madeline Dickerson  Procedure(s) Performed: Procedure(s) (LRB): POSTERIOR LUMBAR FUSION 1 LEVEL (N/A)  Patient Location: PACU  Anesthesia Type: General  Level of Consciousness: awake, alert  and oriented  Airway & Oxygen Therapy: Patient Spontanous Breathing and Patient connected to nasal cannula oxygen  Post-op Assessment: Report given to PACU RN, Post -op Vital signs reviewed and stable, Patient moving all extremities and Patient able to stick tongue midline  Post vital signs: Reviewed and stable  Complications: No apparent anesthesia complications

## 2011-04-27 NOTE — Progress Notes (Signed)
BP 132/77  Pulse 73  Temp(Src) 97.8 F (36.6 C) (Oral)  Resp 14  SpO2 99%  LMP 04/01/2011 Doing well post op.Moving lower extremities well. Wound clean, no signs infection.

## 2011-04-27 NOTE — H&P (Signed)
BP 117/77  Pulse 96  Temp(Src) 97.8 F (36.6 C) (Oral)  Resp 18  SpO2 99%  LMP 03/01/2011 Past Medical History  Diagnosis Date  . Anxiety   . Hypertension   . Chronic low back pain 1991    disk s/p 4 diskectomies, 5th fursion, then spinal cord stimulator (Debra Colon)  . Bronchitis     2 weeks ago rx regime  . Depression   . GERD (gastroesophageal reflux disease)   . Arthritis    Past Surgical History  Procedure Date  . Spine surgery 7/ 12 last    5 previous  . Stimulator 12    spine,removed 7/12  . Cesarean section 86   Prior to Admission medications   Medication Sig Start Date End Date Taking? Authorizing Provider  diazepam (VALIUM) 5 MG tablet Take 1 tablet (5 mg total) by mouth every 12 (twelve) hours as needed for anxiety. 01/17/11  Yes Duncan Dull, MD  FLUoxetine (PROZAC) 40 MG capsule Take 1 capsule (40 mg total) by mouth daily. 02/25/11  Yes Duncan Dull, MD  ibuprofen (ADVIL,MOTRIN) 200 MG tablet Take 200 mg by mouth every 6 (six) hours as needed. For pain   Yes Historical Provider, MD  lansoprazole (PREVACID) 30 MG capsule Take 30 mg by mouth daily.     Yes Historical Provider, MD  ondansetron (ZOFRAN) 8 MG tablet Take 8 mg by mouth every 8 (eight) hours as needed. For nausea   Yes Historical Provider, MD  oxyCODONE-acetaminophen (PERCOCET) 10-325 MG per tablet Take 1 tablet by mouth every 4 (four) hours as needed. For pain   Yes Historical Provider, MD  valsartan-hydrochlorothiazide (DIOVAN-HCT) 160-12.5 MG per tablet Take 1 tablet by mouth daily.   Yes Historical Provider, MD  No results found for this or any previous visit (from the past 72 hour(s)). BMET    Component Value Date/Time   NA 137 04/22/2011 0839   K 4.0 04/22/2011 0839   CL 104 04/22/2011 0839   CO2 24 04/22/2011 0839   GLUCOSE 90 04/22/2011 0839   BUN 17 04/22/2011 0839   CREATININE 0.85 04/22/2011 0839   CALCIUM 10.0 04/22/2011 0839   GFRNONAA 77* 04/22/2011 0839   GFRAA 90* 04/22/2011 0839    CBC      Component Value Date/Time   WBC 8.9 04/22/2011 0839   RBC 4.37 04/22/2011 0839   HGB 13.2 04/22/2011 0839   HCT 39.1 04/22/2011 0839   PLT 449* 04/22/2011 0839   MCV 89.5 04/22/2011 0839   MCH 30.2 04/22/2011 0839   MCHC 33.8 04/22/2011 0839   RDW 15.7* 04/22/2011 0839   LYMPHSABS 4.1* 04/07/2011 2321   MONOABS 0.4 04/07/2011 2321   EOSABS 0.4 04/07/2011 2321   BASOSABS 0.1 04/07/2011 2321    Ms. Kushner is a long term patient of mine. She has had low back surgery x4, the last being a fusion at L4/5. We also placed a spinal cord stimulator wh A myelogram and post myelogram CT showed a stenotic level now at L3/4. She has hypertrophic facets at that level also.   Ms. Savino returns. She says she has a lot of pain in the back, a lot of pain in her legs whenever she stands or walks. I am not sure if she is giving me answers because she really wants this operation or if this is what hurts. She is stenotic now at 3-4, the level above where she was fused at 4-5. We could certainly put in screws at this level and  just connect it to what we had. I would do a PLIF at this level to make sure that there is enough decompression. We will probably do a medial to lateral screw and might have to take down the entire facet.  Risks and benefits of the surgery were explained, bleeding, infection, no pain relief, need for further surgery, weakness, damage to the nerves, bowel and bladder dysfunction, and other risks. She understands and wishes to proceed.

## 2011-04-27 NOTE — Anesthesia Procedure Notes (Signed)
Procedure Name: Intubation Date/Time: 04/27/2011 9:08 AM Performed by: Elizbeth Squires Pre-anesthesia Checklist: Patient identified, Emergency Drugs available, Suction available and Patient being monitored Patient Re-evaluated:Patient Re-evaluated prior to inductionOxygen Delivery Method: Circle system utilized Preoxygenation: Pre-oxygenation with 100% oxygen Intubation Type: IV induction Ventilation: Mask ventilation without difficulty and Oral airway inserted - appropriate to patient size Laryngoscope Size: Mac and 3 Grade View: Grade I Tube type: Oral Tube size: 7.5 mm Number of attempts: 1 Airway Equipment and Method: Stylet and Oral airway Placement Confirmation: ETT inserted through vocal cords under direct vision,  breath sounds checked- equal and bilateral and positive ETCO2 Secured at: 19 cm Tube secured with: Tape Dental Injury: Teeth and Oropharynx as per pre-operative assessment

## 2011-04-27 NOTE — Op Note (Signed)
04/27/2011  2:50 PM  PATIENT:  Madeline Dickerson  50 y.o. female long history of low back and lower extremity pain.  PRE-OPERATIVE DIAGNOSIS:  lumbar stenosis lumbar spondylosis lumbago L3/4  POST-OPERATIVE DIAGNOSIS:  lumbar stenosis lumbar spondylosis lumbago L3/4  PROCEDURE:  Procedure(s): POSTERIOR LUMBAR FUSION Interbody arthrodesis L3/4 with Autograft Peek interbody grafts(alphatek) Posterolateral Arthrodesis L3/4 Autograft Segmental Pedicle screw fixation L3-S1 alphatek Lumbar decompression beyond what is needed for a  PLif, L3/4 for the L3, and L4 nerve roots bilaterally  SURGEON:  Surgeon(s): Carmela Hurt, MD Hewitt Shorts, MD  ASSISTANTS:Nudelman  ANESTHESIA:   general  EBL:  Total I/O In: 2750 [I.V.:2500; IV Piggyback:250] Out: 1635 [Urine:1235; Blood:400]  BLOOD ADMINISTERED:none  CELL SAVER GIVEN:none  COUNT:per nursing  DRAINS: none   SPECIMEN:  No Specimen  DICTATION: [name] was brought to the operating room intubated and placed under a general anesthetic without difficulty. A foley catheter was placed under sterile conditions. She was turned prone onto the [facility] Table with all pressure points properly padded. Her back was prepped and draped in a sterile manner. I opened the skin with a 10 blade and took the incision through the dense scar of the thoracolumbar fascia. I exposed the lamina of L3 and L2. I then worked caudally with Dr. Earl Gala assistance the remove the scar caudal to the L3 lamina. I did do a complete laminectomy of L3 to decompress the L3 and L4 nerve roots. This was in excess of what is needed to perform Plif.  I then was able to  Perform discetomies bilaterally at L3/4. Using the Alphatek instrumentation along with curettes and Kerrison punches I removed soft tissue and disc from the disc space. I prepared the vertebral bodies with shavers, rasps, and curettes to accept 11mm peek interbody grafts filled with autograft. Having completed  the PLIF at L3/4 I turned my attention to the pedicle screws. Although all the previous screws were satisfactory, I did want to place a new screw at L4 on the right. I drilled, probed then tapped the screw a 6.5x92mm screw. Fluoroscopic guidance was used for this screw and for the L3 screws. I placed medial to lateral cortical pedicle  Screws at L3. I first drilled, then used a 5.72mm tap, then 5.5x35mm screws without difficulty.  I completed the Posterolateral arthrodesis, by decorticating the pars and facets at L3/4 using local autograft.  I used an offset connector to connect the L3 screws to the rest of the construct on each side. The offset connectors were medtronic supplied. I placed locking caps  To secure the screws to the rods. I irrigated copiously then closed the wound in layers. I used vicryl sutures to close the wound. I used dermabond for a sterile dressing. She was turned supine onto a hospital bed, then extubated.   PLAN OF CARE: Admit to inpatient   PATIENT DISPOSITION:  PACU - hemodynamically stable.   Delay start of Pharmacological VTE agent (>24hrs) due to surgical blood loss or risk of bleeding:  yes

## 2011-04-27 NOTE — Preoperative (Signed)
Beta Blockers   Reason not to administer Beta Blockers: not prescribed 

## 2011-04-27 NOTE — Anesthesia Preprocedure Evaluation (Addendum)
Anesthesia Evaluation  Patient identified by MRN, date of birth, ID band Patient awake    Reviewed: Allergy & Precautions, H&P , NPO status , Patient's Chart, lab work & pertinent test results, reviewed documented beta blocker date and time   Airway Mallampati: II  Neck ROM: Full    Dental  (+) Teeth Intact, Caps and Dental Advisory Given   Pulmonary  breath sounds clear to auscultation  + wheezing      Cardiovascular hypertension, Pt. on medications Rhythm:Regular Rate:Normal     Neuro/Psych PSYCHIATRIC DISORDERS Anxiety Depression    GI/Hepatic GERD-  Medicated,  Endo/Other    Renal/GU      Musculoskeletal   Abdominal (+) + obese,   Peds  Hematology   Anesthesia Other Findings   Reproductive/Obstetrics                          Anesthesia Physical Anesthesia Plan  ASA: II  Anesthesia Plan: General   Post-op Pain Management:    Induction: Intravenous  Airway Management Planned: Oral ETT  Additional Equipment:   Intra-op Plan:   Post-operative Plan: Extubation in OR  Informed Consent: I have reviewed the patients History and Physical, chart, labs and discussed the procedure including the risks, benefits and alternatives for the proposed anesthesia with the patient or authorized representative who has indicated his/her understanding and acceptance.   Dental advisory given  Plan Discussed with: CRNA and Surgeon  Anesthesia Plan Comments:         Anesthesia Quick Evaluation

## 2011-04-28 MED ORDER — HYDROMORPHONE 0.3 MG/ML IV SOLN
INTRAVENOUS | Status: AC
  Filled 2011-04-28: qty 25

## 2011-04-28 MED ORDER — HYDROMORPHONE HCL PF 1 MG/ML IJ SOLN: 1.0000 mg | INTRAMUSCULAR | Status: DC | PRN

## 2011-04-28 MED ORDER — HYDROMORPHONE HCL PF 1 MG/ML IJ SOLN
1.0000 mg | INTRAMUSCULAR | Status: DC | PRN
  Administered 2011-04-28 – 2011-04-30 (×20): 1 mg via INTRAVENOUS
  Filled 2011-04-28 (×20): qty 1

## 2011-04-28 NOTE — Progress Notes (Signed)
UR COMPLETED  

## 2011-04-28 NOTE — Progress Notes (Addendum)
Met with pt re HH needs, list of providers given and pt selected [facility] of HHPT and DME, will order.  Johny Shock RN MPH Case Manager [phone]      CARE MANAGEMENT NOTE 04/28/2011  Patient:  Madeline Dickerson, Madeline Dickerson   Account Number:  [id number]  Date Initiated:  04/28/2011  Documentation initiated by:  Roxy Mastandrea  Subjective/Objective Assessment:   Request for [facility] and DME as needed.     Action/Plan:   Met with pt who selected [facility] for DME and HH.   Anticipated DC Date:  04/30/2011   Anticipated DC Plan:  HOME W HOME HEALTH SERVICES         [facility] Choice  DURABLE MEDICAL EQUIPMENT  HOME HEALTH   Choice offered to / List presented to:  C-1 Patient   DME arranged  [facility]  3-N-1      DME agency  [facility]     HH arranged  HH-2 PT      [facility] agency  [facility]   Status of service:  Completed, signed off Medicare Important Message given?   (If response is "NO", the following Medicare IM given date fields will be blank) Date Medicare IM given:   Date Additional Medicare IM given:    Discharge Disposition:  HOME W HOME HEALTH SERVICES  Per UR Regulation:    Comments:

## 2011-04-28 NOTE — Progress Notes (Signed)
Patient ID: IYONNAH FERRANTE, female   DOB: [birth year], 51 y.o.   MRN: [id number] BP 107/72  Pulse 80  Temp(Src) 98.1 F (36.7 C) (Oral)  Resp 18  Ht 5' 6.5" (1.689 m)  Wt 81.647 kg (180 lb)  BMI 28.62 kg/m2  SpO2 97%  LMP 04/01/2011 Alert and oriented x 4. Speech clear and fluent. Moving lower extremities well. Wound dressing intact. Cont with pt. Has been out of bed. PCA discontinued.

## 2011-04-28 NOTE — Evaluation (Signed)
Physical Therapy Evaluation Patient Details Name: Madeline Dickerson MRN: [id number] DOB: 11-05-1960 Today's Date: 04/28/2011  Problem List:  Patient Active Problem List  Diagnoses  . Anxiety  . Depression  . Hypertension  . Chronic low back pain  . Screening for colon cancer  . Abnormal EKG  . Tobacco abuse  . Tobacco abuse counseling  . Lumbar spondylosis    Past Medical History:  Past Medical History  Diagnosis Date  . Anxiety   . Hypertension   . Chronic low back pain 1991    disk s/p 4 diskectomies, 5th fursion, then spinal cord stimulator (Cabbell)  . Bronchitis     2 weeks ago rx regime  . Depression   . GERD (gastroesophageal reflux disease)   . Arthritis    Past Surgical History:  Past Surgical History  Procedure Date  . Spine surgery 7/ 12 last    5 previous  . Stimulator 12    spine,removed 7/12  . Cesarean section 86  . Lumbar fusion 04/27/2011    PT Assessment/Plan/Recommendation PT Assessment Clinical Impression Statement: Patient is a 51 year old female s/p L3-L4 PLIF.  Patient will benefit from acute skilled PT to increased mobility, safety awareness, and back precaution education. PT Recommendation/Assessment: Patient will need skilled PT in the acute care venue PT Problem List: Decreased activity tolerance;Decreased mobility;Decreased knowledge of precautions;Decreased safety awareness;Impaired sensation;Pain Barriers to Discharge: Decreased caregiver support PT Therapy Diagnosis : Abnormality of gait;Acute pain PT Plan PT Frequency: Min 5X/week PT Treatment/Interventions: DME instruction;Gait training;Stair training;Functional mobility training;Therapeutic activities;Therapeutic exercise;Balance training;Patient/family education PT Recommendation Follow Up Recommendations: Home health PT Equipment Recommended: Rolling walker with 5" wheels;3 in 1 bedside comode PT Goals  Acute Rehab PT Goals PT Goal Formulation: With patient Time For Goal  Achievement: 7 days Pt will Roll Supine to Right Side: with modified independence;with rail (Demonstrating proper back precautions) PT Goal: Rolling Supine to Right Side - Progress: Goal set today Pt will Roll Supine to Left Side: with modified independence;with rail (Demonstrating proper back precautions) PT Goal: Rolling Supine to Left Side - Progress: Goal set today Pt will go Supine/Side to Sit: Independently (Demonstrating proper back precautions) PT Goal: Supine/Side to Sit - Progress: Goal set today Pt will go Sit to Supine/Side: with modified independence (Demonstrating proper back precautions) PT Goal: Sit to Supine/Side - Progress: Goal set today Pt will go Sit to Stand: with upper extremity assist (with min guard) PT Goal: Sit to Stand - Progress: Goal set today Pt will go Stand to Sit: with supervision;with upper extremity assist PT Goal: Stand to Sit - Progress: Goal set today Pt will Ambulate: >150 feet;with supervision;with rolling walker PT Goal: Ambulate - Progress: Goal set today Pt will Go Up / Down Stairs: 3-5 stairs;with rail(s) (with min guard) PT Goal: Up/Down Stairs - Progress: Goal set today Additional Goals Additional Goal #1: Patient will be able to verbalize and demonstrate 3/3 back precautions. PT Goal: Additional Goal #1 - Progress: Goal set today  PT Evaluation Precautions/Restrictions  Precautions Precautions: Back Precaution Booklet Issued: Yes (comment) Required Braces or Orthoses: Yes Spinal Brace: Lumbar corset;Applied in sitting position Restrictions Weight Bearing Restrictions: No Prior Functioning  Home Living Lives With: Family Type of Home: Mobile home Home Layout: One level Home Access: Stairs to enter Entrance Stairs-Rails: Right Entrance Stairs-Number of Steps: 5-6 Bathroom Shower/Tub: Engineer, manufacturing systems: Standard Home Adaptive Equipment: None Prior Function Level of Independence: Independent with basic ADLs Able to  Take Stairs?: Yes  Driving: Yes Vocation: On disability Cognition Cognition Arousal/Alertness: Awake/alert Overall Cognitive Status: Appears within functional limits for tasks assessed Orientation Level: Oriented X4 Sensation/Coordination Sensation Light Touch: Impaired Detail Light Touch Impaired Details: Impaired LLE (Patient reports numbness of her L foot/leg.  To be tested at another time.) Extremity Assessment   Mobility (including Balance) Bed Mobility Bed Mobility: Yes Rolling Left: 6: Modified independent (Device/Increase time);With rail Right Sidelying to Sit: 6: Modified independent (Device/Increase time);With rails Sitting - Scoot to Edge of Bed: 7: Independent Transfers Transfers: Yes Sit to Stand: 4: Min assist;From bed Stand to Sit: 4: Min assist Stand to Sit Details: Pt required min assist for decent control. Ambulation/Gait Ambulation/Gait: Yes Ambulation/Gait Assistance: Other (comment) (Min guard)  Min guard for safety with cues for hand placement Ambulation Distance (Feet): 100 Feet Assistive device: Rolling walker Gait Pattern: Step-through pattern;Decreased stride length;Decreased step length - left;Decreased hip/knee flexion - left;Left foot flat Gait velocity: decreased Stairs: No Wheelchair Mobility Wheelchair Mobility: No  Posture/Postural Control Posture/Postural Control: No significant limitations Balance Balance Assessed: No Exercise    End of Session PT - End of Session Equipment Utilized During Treatment: Gait belt;Back brace Activity Tolerance: Patient tolerated treatment well Patient left: in bed;with call bell in reach Nurse Communication: Other (comment) (Discussed drainage with nurse.) General Behavior During Session: [facility] for tasks performed Cognition: [facility] for tasks performed  Ezzard Standing SPT 04/28/2011, 12:10 PM [place], PT DPT [phone]

## 2011-04-28 NOTE — Progress Notes (Signed)
Pt requesting medicine for sleep as well as an inhaler that she had been taking at home for bronchitis. Neurosurgeon on-call paged. Orders received.

## 2011-04-28 NOTE — Progress Notes (Signed)
Occupational Therapy Evaluation Patient Details Name: Madeline Dickerson MRN: [id number] DOB: 09-03-1960 Today's Date: 04/28/2011  Problem List:  Patient Active Problem List  Diagnoses  . Anxiety  . Depression  . Hypertension  . Chronic low back pain  . Screening for colon cancer  . Abnormal EKG  . Tobacco abuse  . Tobacco abuse counseling  . Lumbar spondylosis    Past Medical History:  Past Medical History  Diagnosis Date  . Anxiety   . Hypertension   . Chronic low back pain 1991    disk s/p 4 diskectomies, 5th fursion, then spinal cord stimulator (Cabbell)  . Bronchitis     2 weeks ago rx regime  . Depression   . GERD (gastroesophageal reflux disease)   . Arthritis    Past Surgical History:  Past Surgical History  Procedure Date  . Spine surgery 7/ 12 last    5 previous  . Stimulator 12    spine,removed 7/12  . Cesarean section 86  . Lumbar fusion 04/27/2011    OT Assessment/Plan/Recommendation OT Assessment Clinical Impression Statement: Pt s/p L3-L4 PLIF thus affecting PLOF.  Will benefit from acute OT to address below problem list in prep for d/c home with father. OT Recommendation/Assessment: Patient will need skilled OT in the acute care venue OT Problem List: Decreased activity tolerance;Decreased knowledge of use of DME or AE;Decreased knowledge of precautions;Pain OT Therapy Diagnosis : Acute pain OT Plan OT Frequency: Min 2X/week OT Treatment/Interventions: Self-care/ADL training;DME and/or AE instruction;Therapeutic activities;Patient/family education OT Recommendation Follow Up Recommendations: No OT follow up Equipment Recommended: 3 in 1 bedside comode Individuals Consulted Consulted and Agree with Results and Recommendations: Patient OT Goals Acute Rehab OT Goals OT Goal Formulation: With patient Time For Goal Achievement: 7 days ADL Goals Pt Will Perform Grooming: with modified independence;Standing at sink (maintaining back precautions) ADL  Goal: Grooming - Progress: Goal set today Pt Will Perform Lower Body Bathing: with modified independence;Sit to stand from bed;Sit to stand from chair;with adaptive equipment ADL Goal: Lower Body Bathing - Progress: Goal set today Pt Will Perform Lower Body Dressing: with modified independence;Sit to stand from chair;Sit to stand from bed;with adaptive equipment ADL Goal: Lower Body Dressing - Progress: Goal set today Pt Will Transfer to Toilet: with modified independence;with DME;3-in-1;Ambulation;Maintaining back safety precautions ADL Goal: Toilet Transfer - Progress: Goal set today Pt Will Perform Tub/Shower Transfer: Tub transfer;with modified independence;with DME;Ambulation ADL Goal: Tub/Shower Transfer - Progress: Goal set today  OT Evaluation Precautions/Restrictions  Precautions Precautions: Back Precaution Booklet Issued: Yes (comment) Required Braces or Orthoses: Yes Spinal Brace: Lumbar corset;Applied in sitting position Restrictions Weight Bearing Restrictions: No Prior Functioning Home Living Lives With: Family (father) Type of Home: Mobile home Home Layout: One level Home Access: Stairs to enter Entrance Stairs-Rails: Right Entrance Stairs-Number of Steps: 5-6 Bathroom Shower/Tub: Engineer, manufacturing systems: Standard Home Adaptive Equipment: None Prior Function Level of Independence: Independent with basic ADLs Able to Take Stairs?: Yes Driving: Yes Vocation: On disability ADL ADL Eating/Feeding: Performed;Modified independent Where Assessed - Eating/Feeding: Bed level Grooming: Simulated;Wash/dry face;Wash/dry hands;Set up Where Assessed - Grooming: Sitting, bed Lower Body Bathing: Simulated;Minimal assistance Lower Body Bathing Details (indicate cue type and reason): assist to maintain back precautions while crossing legs Where Assessed - Lower Body Bathing: Sit to stand from bed Lower Body Dressing: Simulated;Minimal assistance Lower Body Dressing  Details (indicate cue type and reason): assist to maintain back precautions while crossing legs Where Assessed - Lower Body Dressing: Sit to  stand from bed Toilet Transfer: Simulated;Minimal assistance Toilet Transfer Details (indicate cue type and reason): cueing for safe technique and maintaining back precautions Toilet Transfer Method: Ambulating Toilet Transfer Equipment: Other (comment) (sitting EOB) Equipment Used: Rolling walker Vision/Perception    Cognition Cognition Arousal/Alertness: Awake/alert Overall Cognitive Status: Appears within functional limits for tasks assessed Orientation Level: Oriented X4 Sensation/Coordination Sensation Light Touch: Impaired Detail Light Touch Impaired Details: Impaired LLE (Patient reports numbness of her L foot/leg.  To be tested at) Coordination Gross Motor Movements are Fluid and Coordinated: Yes (bil. UE) Fine Motor Movements are Fluid and Coordinated: Yes (bil. UE) Extremity Assessment RUE Assessment RUE Assessment: Within Functional Limits LUE Assessment LUE Assessment: Within Functional Limits Mobility  Bed Mobility Bed Mobility: Yes Rolling Left: 6: Modified independent (Device/Increase time);With rail Right Sidelying to Sit: 6: Modified independent (Device/Increase time);With rails Sitting - Scoot to Edge of Bed: 7: Independent Transfers Transfers: Yes Sit to Stand: 4: Min assist;From bed Stand to Sit: 4: Min assist Stand to Sit Details: Pt required min assist for decent control. Exercises   End of Session OT - End of Session Equipment Utilized During Treatment: Gait belt;Back brace Activity Tolerance: Patient limited by fatigue Patient left: in bed;with call bell in reach Nurse Communication: Other (comment) (incision drainage) General Behavior During Session: [facility] for tasks performed Cognition: [facility] for tasks performed  12:42 PM  04/28/2011 Cipriano Mile OTR/L Pager [phone] Office [phone]

## 2011-04-29 NOTE — Discharge Instructions (Signed)
Spinal Fusion Care After Refer to this sheet in the next few weeks. These instructions provide you with information on caring for yourself after your procedure. Your caregiver may also give you more specific instructions. Your treatment has been planned according to current medical practices, but problems sometimes occur. Call your caregiver if you have any problems or questions after your procedure. HOME CARE INSTRUCTIONS   Take whatever pain medicine has been prescribed by your caregiver. Do not take over-the-counter pain medicine unless directed otherwise by your caregiver.   Do not drive if you are taking narcotic pain medicines.   Change your bandage (dressing) if necessary or as directed by your caregiver.   Do not get your surgical cut (incision) wet. After a few days you may take quick showers (rather than baths), but keep your incision clean and dry.  A few weeks after surgery, once your incision has healed and your caregiver says it is okay, you can take baths or go swimming.   If you have been prescribed medicine to prevent your blood from clotting, follow the directions carefully.   Check the area around your incision often. Look for redness and swelling. Also, look for anything leaking from your wound. You can use a mirror or have a family member inspect your incision if it is in a place where it is difficult for you to see.   Ask your caregiver what activities you should avoid and for how long.   Walk as much as possible.   Do not lift anything heavier than 10 pounds (4.5 kilograms) until your caregiver says it is safe.   Do not twist or bend for a few weeks. Try not to pull on things. Avoid sitting for long periods of time. Change positions at least every hour.   Ask your caregiver what kinds of exercise you should do to make your back stronger and when you should begin doing these exercises.  SEEK IMMEDIATE MEDICAL CARE IF:   Pain suddenly becomes much worse.   The  incision area is red, swollen, bleeding, or leaking fluid.   Your legs or feet become increasingly painful, numb, weak, or swollen.   You have trouble controlling urination or bowel movements.   You have trouble breathing.   You have chest pain.   You have a fever.  MAKE SURE YOU:  Understand these instructions.   Will watch your condition.   Will get help right away if you are not doing well or get worse.  Document Released: 08/27/2004 Document Revised: 01/27/2011 Document Reviewed: 04/22/2010 [facility] Patient Information 2012 [place], [place].

## 2011-04-29 NOTE — Progress Notes (Signed)
CSW received consult for "SNF." Pt will be discharging home over the weekend and will have home health PT arranged by [facility]. CSW is signing off as no discharge needs identified. Please reconsult if a need arises prior to discharge.   Dede Query, MSW, Theresia Majors [phone]

## 2011-04-29 NOTE — Progress Notes (Signed)
Nursing- pt was observed in room walking to the bathroom without brace on.  I reinforced to the patient that she should wear her brace whenever she is out of bed. Patient verbalized understanding. Will continue to monitor

## 2011-04-29 NOTE — Progress Notes (Signed)
Occupational Therapy Treatment Patient Details Name: Madeline Dickerson MRN: [id number] DOB: 18-Dec-1960 Today's Date: 04/29/2011  OT Assessment/Plan OT Assessment/Plan Comments on Treatment Session: Pt able to incorporate back precautions during ADLs without cueing.  Progressing toward goals quickly. OT Plan: Discharge plan remains appropriate OT Frequency: Min 2X/week Follow Up Recommendations: No OT follow up Equipment Recommended: 3 in 1 bedside comode OT Goals ADL Goals Pt Will Perform Lower Body Bathing: with modified independence;Sit to stand from bed;Sit to stand from chair;with adaptive equipment ADL Goal: Lower Body Bathing - Progress: Met Pt Will Perform Lower Body Dressing: with modified independence;Sit to stand from chair;Sit to stand from bed;with adaptive equipment ADL Goal: Lower Body Dressing - Progress: Met Pt Will Transfer to Toilet: with modified independence;with DME;3-in-1;Ambulation;Maintaining back safety precautions ADL Goal: Toilet Transfer - Progress: Progressing toward goals Pt Will Perform Tub/Shower Transfer: Tub transfer;with modified independence;with DME;Ambulation ADL Goal: Tub/Shower Transfer - Progress: Progressing toward goals  OT Treatment Precautions/Restrictions  Precautions Precautions: Back Precaution Booklet Issued: Yes (comment) Precaution Comments: Pt able to verbalize 3/3 back precautions. Required Braces or Orthoses: Yes Spinal Brace: Lumbar corset   ADL ADL Lower Body Bathing: Simulated;Modified independent Lower Body Bathing Details (indicate cue type and reason): able to cross bil. legs with minimal discomfort Where Assessed - Lower Body Bathing: Sit to stand from chair Lower Body Dressing: Performed;Modified independent Lower Body Dressing Details (indicate cue type and reason): able to cross bil. legs with minimal discomfort Where Assessed - Lower Body Dressing: Sit to stand from chair Toilet Transfer:  Performed;Supervision/safety Toilet Transfer Details (indicate cue type and reason): able to maintain back precautions without cueing Toilet Transfer Method: Ambulating Toilet Transfer Equipment: Regular height toilet Toileting - Clothing Manipulation: Performed;Modified independent Where Assessed - Toileting Clothing Manipulation: Sit to stand from 3-in-1 or toilet Toileting - Hygiene: Performed;Modified independent Where Assessed - Toileting Hygiene: Sit on 3-in-1 or toilet Tub/Shower Transfer: Performed;Supervision/safety Tub/Shower Transfer Details (indicate cue type and reason): demonstrated safe hand placement and technique Tub/Shower Transfer Method: Ambulating Equipment Used: Rolling walker ADL Comments: Pt reports that she does have shower seat with back for tub at home. Mobility  Bed Mobility Bed Mobility: Yes Sit to Sidelying Left: 6: Modified independent (Device/Increase time) Transfers Transfers: Yes Sit to Stand: 6: Modified independent (Device/Increase time);From toilet;From chair/3-in-1;With upper extremity assist;With armrests Stand to Sit: 6: Modified independent (Device/Increase time);With upper extremity assist;With armrests;To bed;To chair/3-in-1;To toilet Exercises    End of Session OT - End of Session Equipment Utilized During Treatment: Back brace Activity Tolerance: Patient tolerated treatment well Patient left: in bed;with call bell in reach General Behavior During Session: [facility] for tasks performed Cognition: [facility] for tasks performed   4:37 PM 04/29/2011 Cipriano Mile OTR/L Pager [phone] Office [phone]

## 2011-04-29 NOTE — Progress Notes (Signed)
OT Cancellation Note  Treatment cancelled today due to patient's refusal to participate.  Pt recently returned to bed following PT session.  Will re-attempt this afternoon.   04/29/2011 Cipriano Mile OTR/L Pager [phone] Office [phone]

## 2011-04-29 NOTE — Progress Notes (Signed)
Subjective: Patient reports hurts in lower back but I have got to keep  moving.  Objective: Vital signs in last 24 hours: Temp:  [97.6 F (36.4 C)-98.3 F (36.8 C)] 98.3 F (36.8 C) (03/08 0900) Pulse Rate:  [78-89] 86  (03/08 0900) Resp:  [18-20] 18  (03/08 0900) BP: (91-124)/(52-84) 121/60 mmHg (03/08 1126) SpO2:  [93 %-97 %] 97 % (03/08 0900)  Intake/Output from previous day:   Intake/Output this shift:    Alert, oriented x4, speech clear and fluent. Moving lower extremities well, normal strength. Wound clean and dry.  Lab Results: No results found for this basename: WBC:2,HGB:2,HCT:2,PLT:2 in the last 72 hours BMET No results found for this basename: NA:2,K:2,CL:2,CO2:2,GLUCOSE:2,BUN:2,CREATININE:2,CALCIUM:2 in the last 72 hours  Studies/Results: Dg Lumbar Spine 2-3 Views  04/27/2011  *RADIOLOGY REPORT*  Clinical Data: L3-4 P L I F  LUMBAR SPINE - 2-3 VIEW  Comparison: 04/27/2011  Findings: Previous pedicle screw fusion at L4-5 and L5-S1. Bilateral screws at L3 and S1.  Left-sided unilateral screw at L4. Posterior rods have been removed.   Interval placement of pedicle screws at L3.  Rods have not yet been placed connecting the screws. Interval placement of interbody spacer at L3-4.  IMPRESSION: Interval placement of pedicle screws and interbody fusion at L3-4.  Original Report Authenticated By: Camelia Phenes, M.D.    Assessment/Plan: Doing well with PT. Possible D/C Sunday.   LOS: 2 days     Lamoyne Hessel L 04/29/2011, 11:53 AM

## 2011-04-29 NOTE — Progress Notes (Signed)
Agree with student PT note.  Madeline Dickerson, PT DPT [phone]  

## 2011-04-29 NOTE — Progress Notes (Signed)
Pt c/o heartburn and nausea as well as pain.  Upon returning with maalox, pt had vomited in bathroom. Pt stated, "I stuck my finger down my throat to make it feel better."  Pt took maalox and stated relief upon next assessment.

## 2011-04-29 NOTE — Progress Notes (Signed)
Physical Therapy Treatment Patient Details Name: Madeline Dickerson MRN: [id number] DOB: February 12, 1961 Today's Date: 04/29/2011  PT Assessment/Plan  PT - Assessment/Plan Comments on Treatment Session: Patient demonstrated increased activity tolerance and was able to mbulate 200 feet and go up and down 6 stairs using R railing. PT Frequency: Min 5X/week Follow Up Recommendations: Home health PT Equipment Recommended: Rolling walker with 5" wheels PT Goals  Acute Rehab PT Goals PT Goal Formulation: With patient Time For Goal Achievement: 7 days Pt will go Sit to Stand: with upper extremity assist;with supervision PT Goal: Sit to Stand - Progress: Met Pt will go Stand to Sit: with supervision;with upper extremity assist PT Goal: Stand to Sit - Progress: Met Pt will Ambulate: >150 feet;with supervision;with rolling walker PT Goal: Ambulate - Progress: Progressing toward goal Pt will Go Up / Down Stairs: 3-5 stairs;with rail(s) PT Goal: Up/Down Stairs - Progress: Met Additional Goals Additional Goal #1: Patient will be able to verbalize and demonstrate 3/3 back precautions. PT Goal: Additional Goal #1 - Progress: Progressing toward goal  PT Treatment Precautions/Restrictions  Precautions Precautions: Back Precaution Booklet Issued: Yes (comment) Precaution Comments: Patient able to describe 2/3 back precautions but forgot about arching.  However, patient followed precautions throughout treatment without cuing. Required Braces or Orthoses: Yes Spinal Brace: Lumbar corset Restrictions Weight Bearing Restrictions: No Mobility (including Balance) Bed Mobility Bed Mobility: No Transfers Transfers: Yes Sit to Stand: 5: Supervision;With upper extremity assist;From chair/3-in-1;With armrests Stand to Sit: 5: Supervision;With upper extremity assist;With armrests;To chair/3-in-1 Ambulation/Gait Ambulation/Gait: Yes Ambulation/Gait Assistance: Other (comment);5: Supervision (Min  guard) Ambulation/Gait Assistance Details (indicate cue type and reason): Patient requires supervision when walking with a RW 100 feet and requires min guard when walking without a RW 100 feet. Ambulation Distance (Feet): 200 Feet Assistive device: Rolling walker Gait Pattern: Step-through pattern;Decreased step length - left;Decreased hip/knee flexion - left;Left foot flat Gait velocity: decreased Stairs: Yes Stairs Assistance: Other (comment) Stairs Assistance Details (indicate cue type and reason): Patient required min guard when going up/down stairs. Stair Management Technique: Alternating pattern;One rail Right;Forwards (Pt walked forward using both hands on rail without twisting) Number of Stairs: 6  Wheelchair Mobility Wheelchair Mobility: No  Posture/Postural Control Posture/Postural Control: No significant limitations Balance Balance Assessed: No Exercise    End of Session PT - End of Session Equipment Utilized During Treatment: Gait belt;Back brace Activity Tolerance: Patient limited by fatigue Patient left: in chair;with call bell in reach Nurse Communication: Other (comment) (Reported that the pt was not dizzy during treatment.) General Behavior During Session: [facility] for tasks performed Cognition: [facility] for tasks performed  Ezzard Standing SPT 04/29/2011, 11:05 AM

## 2011-04-30 MED ORDER — POLYETHYLENE GLYCOL 3350 17 G PO PACK
17.0000 g | PACK | Freq: Every day | ORAL | Status: DC
  Administered 2011-04-30 – 2011-05-01 (×2): 17 g via ORAL
  Filled 2011-04-30 (×2): qty 1

## 2011-04-30 MED ORDER — FLEET ENEMA 7-19 GM/118ML RE ENEM: 1.0000 | ENEMA | Freq: Every day | RECTAL | Status: DC | PRN

## 2011-04-30 MED ORDER — HYDROMORPHONE HCL PF 1 MG/ML IJ SOLN
1.0000 mg | INTRAMUSCULAR | Status: DC | PRN
  Administered 2011-04-30 – 2011-05-01 (×7): 1 mg via INTRAVENOUS
  Filled 2011-04-30 (×7): qty 1

## 2011-04-30 MED ORDER — BISACODYL 10 MG RE SUPP: 10.0000 mg | Freq: Every day | RECTAL | Status: DC | PRN

## 2011-04-30 NOTE — Progress Notes (Signed)
Physical Therapy Treatment Patient Details Name: Madeline Dickerson MRN: [id number] DOB: Jul 05, 1960 Today's Date: 04/30/2011  PT Assessment/Plan  PT - Assessment/Plan Comments on Treatment Session: Pt progressing well. Pt near baseline functional level although still requires superivsion secondary to impairments (Pt has met all PT goals, no further acute PT needed) PT Plan: All goals met and education completed, patient dischaged from PT services PT Frequency: Min 5X/week Follow Up Recommendations: Home health PT Equipment Recommended: 3 in 1 bedside comode PT Goals  Acute Rehab PT Goals PT Goal Formulation: With patient PT Goal: Sit to Stand - Progress: Met PT Goal: Stand to Sit - Progress: Met PT Goal: Ambulate - Progress: Met PT Goal: Up/Down Stairs - Progress: Met Additional Goals PT Goal: Additional Goal #1 - Progress: Met  PT Treatment Precautions/Restrictions  Precautions Precautions: Back Precaution Booklet Issued: Yes (comment) Precaution Comments: Pt able to verbalize 3/3 back precautions. (Pt able to demonstrate throughout treatment) Required Braces or Orthoses: Yes Spinal Brace: Lumbar corset Restrictions Weight Bearing Restrictions: No Mobility (including Balance) Bed Mobility Bed Mobility: No Transfers Transfers: Yes Sit to Stand: 6: Modified independent (Device/Increase time) Stand to Sit: 6: Modified independent (Device/Increase time) Ambulation/Gait Ambulation/Gait: Yes Ambulation/Gait Assistance: 5: Supervision Ambulation/Gait Assistance Details (indicate cue type and reason): Supervision secondary to safety and pt reaching for objects. Ambulation Distance (Feet): 200 Feet Assistive device: None (handheld for short periods(2-3 ft)) Gait Pattern: Step-through pattern;Decreased stride length;Trunk flexed Gait velocity: decreased Stairs: Yes Stairs Assistance: 5: Supervision Stairs Assistance Details (indicate cue type and reason): VC for sequencing to prevent  twisting during turning Stair Management Technique: Step to pattern;One rail Right;Forwards Number of Stairs: 5     Exercise    End of Session PT - End of Session Equipment Utilized During Treatment: Gait belt;Back brace Activity Tolerance: Patient tolerated treatment well Patient left: in chair;with call bell in reach Nurse Communication: Mobility status for transfers;Mobility status for ambulation General Behavior During Session: [facility] for tasks performed Cognition: [facility] for tasks performed  Madeline Dickerson 04/30/2011, 12:50 PM  04/30/2011 Madeline Dickerson DPT PAGER: [phone] OFFICE: [phone]

## 2011-04-30 NOTE — Progress Notes (Signed)
No new issues. Overall feeling better. Pain control still marginal for home discharge. Patient's toe requiring a fair amount of IV narcotics.  She is afebrile. Vitals are stable. Voiding well. No bowel movement. Neurologically she is awake and alert. Motor and sensory examination are intact. Wound healing well. Chest abdomen benign.  Progressing well following multilevel decompression and fusion surgery. Overall the patient feels like she is getting close to being ready for home discharge. Possible discharge tomorrow if pain control and bowel issues have resolved.

## 2011-05-01 MED ORDER — OXYCODONE-ACETAMINOPHEN 10-325 MG PO TABS: 1.0000 | ORAL_TABLET | ORAL | Status: AC | PRN

## 2011-05-01 MED ORDER — DIAZEPAM 5 MG PO TABS: 5.0000 mg | ORAL_TABLET | Freq: Four times a day (QID) | ORAL | Status: AC | PRN

## 2011-05-01 NOTE — Discharge Summary (Signed)
Physician Discharge Summary  Patient ID: RACQUEL ARKIN MRN: [id number] DOB/AGE: [birth year] 51 y.o.  Admit date: 04/27/2011 Discharge date: 05/01/2011  Admission Diagnoses:  Discharge Diagnoses:  Principal Problem:  *Lumbar spondylosis   Discharged Condition: good  Hospital Course: Admitted to the hospital where she will underwent uncomplicated lumbar decompression and fusion. Postoperatively she is done well peer back and oriented to have been improved. Wound healing well. She is progressing well with therapy as her for discharge home.  Consults:   Significant Diagnostic Studies:   Treatments:   Discharge Exam: Blood pressure 116/84, pulse 73, temperature 98.3 F (36.8 C), temperature source Oral, resp. rate 20, height 5' 6.5" (1.689 m), weight 81.647 kg (180 lb), last menstrual period 04/01/2011, SpO2 97.00%. She is awake and alert oriented and appropriate  Nerve function is intact. Motor and sensory function are intact. Wound is healing well. Chest and abdomen are benign.  Disposition: 01-Home or Self Care  Discharge Orders    Future Appointments: Provider: Department: Dept Phone: Center:   05/18/2011 3:30 PM Sherlene Shams, MD [facility] [phone] None     Medication List  As of 05/01/2011 11:07 AM   STOP taking these medications         ibuprofen 200 MG tablet         TAKE these medications         diazepam 5 MG tablet   Commonly known as: VALIUM   Take 1 tablet (5 mg total) by mouth every 12 (twelve) hours as needed for anxiety.      diazepam 5 MG tablet   Commonly known as: VALIUM   Take 1-2 tablets (5-10 mg total) by mouth every 6 (six) hours as needed for anxiety.      FLUoxetine 40 MG capsule   Commonly known as: PROZAC   Take 1 capsule (40 mg total) by mouth daily.      lansoprazole 30 MG capsule   Commonly known as: PREVACID   Take 30 mg by mouth daily.      ondansetron 8 MG tablet   Commonly known as: ZOFRAN   Take 8 mg by mouth every 8  (eight) hours as needed. For nausea      oxyCODONE-acetaminophen 10-325 MG per tablet   Commonly known as: PERCOCET   Take 1 tablet by mouth every 4 (four) hours as needed. For pain      oxyCODONE-acetaminophen 10-325 MG per tablet   Commonly known as: PERCOCET   Take 1-2 tablets by mouth every 4 (four) hours as needed for pain.      valsartan-hydrochlorothiazide 160-12.5 MG per tablet   Commonly known as: DIOVAN-HCT   Take 1 tablet by mouth daily.           Follow-up Information    Follow up with CABBELL,KYLE L, MD in 4 weeks. (call for appt)    Contact information:   [address] [place] [address] [phone]          Signed: Temple Pacini 05/01/2011, 11:07 AM

## 2011-05-18 ENCOUNTER — Encounter: Payer: Medicare Other | Admitting: Internal Medicine

## 2011-05-18 ENCOUNTER — Ambulatory Visit (INDEPENDENT_AMBULATORY_CARE_PROVIDER_SITE_OTHER): Payer: Medicare Other | Admitting: Psychiatry

## 2011-05-18 DIAGNOSIS — F329 Major depressive disorder, single episode, unspecified: Secondary | ICD-10-CM | POA: Diagnosis not present

## 2011-05-18 DIAGNOSIS — F17200 Nicotine dependence, unspecified, uncomplicated: Secondary | ICD-10-CM | POA: Diagnosis not present

## 2011-05-25 ENCOUNTER — Ambulatory Visit (INDEPENDENT_AMBULATORY_CARE_PROVIDER_SITE_OTHER): Payer: Medicare Other | Admitting: Psychiatry

## 2011-05-25 DIAGNOSIS — F329 Major depressive disorder, single episode, unspecified: Secondary | ICD-10-CM | POA: Diagnosis not present

## 2011-05-25 DIAGNOSIS — F17200 Nicotine dependence, unspecified, uncomplicated: Secondary | ICD-10-CM | POA: Diagnosis not present

## 2011-05-30 DIAGNOSIS — M48061 Spinal stenosis, lumbar region without neurogenic claudication: Secondary | ICD-10-CM | POA: Diagnosis not present

## 2011-06-06 ENCOUNTER — Ambulatory Visit: Payer: Medicare Other | Admitting: Psychiatry

## 2011-07-31 ENCOUNTER — Emergency Department (HOSPITAL_COMMUNITY)
Admission: EM | Admit: 2011-07-31 | Discharge: 2011-07-31 | Disposition: A | Discharge status: Home / Self Care | Payer: Medicare Other | Attending: Emergency Medicine | Admitting: Emergency Medicine

## 2011-07-31 ENCOUNTER — Encounter (HOSPITAL_COMMUNITY): Payer: Self-pay | Admitting: *Deleted

## 2011-07-31 DIAGNOSIS — G8929 Other chronic pain: Secondary | ICD-10-CM | POA: Insufficient documentation

## 2011-07-31 DIAGNOSIS — I10 Essential (primary) hypertension: Secondary | ICD-10-CM | POA: Insufficient documentation

## 2011-07-31 DIAGNOSIS — F341 Dysthymic disorder: Secondary | ICD-10-CM | POA: Insufficient documentation

## 2011-07-31 DIAGNOSIS — M545 Low back pain, unspecified: Secondary | ICD-10-CM | POA: Insufficient documentation

## 2011-07-31 DIAGNOSIS — Z8739 Personal history of other diseases of the musculoskeletal system and connective tissue: Secondary | ICD-10-CM | POA: Insufficient documentation

## 2011-07-31 DIAGNOSIS — M25559 Pain in unspecified hip: Secondary | ICD-10-CM | POA: Diagnosis not present

## 2011-07-31 MED ORDER — METHOCARBAMOL 100 MG/ML IJ SOLN: 1000.0000 mg | Freq: Once | INTRAMUSCULAR | Status: DC

## 2011-07-31 MED ORDER — METHOCARBAMOL 500 MG PO TABS: 1000.0000 mg | ORAL_TABLET | Freq: Three times a day (TID) | ORAL | Status: AC

## 2011-07-31 MED ORDER — OXYCODONE-ACETAMINOPHEN 5-325 MG PO TABS
2.0000 | ORAL_TABLET | Freq: Once | ORAL | Status: AC
  Administered 2011-07-31: 2 via ORAL
  Filled 2011-07-31: qty 2

## 2011-07-31 MED ORDER — KETOROLAC TROMETHAMINE 60 MG/2ML IM SOLN
60.0000 mg | Freq: Once | INTRAMUSCULAR | Status: AC
  Administered 2011-07-31: 60 mg via INTRAMUSCULAR
  Filled 2011-07-31: qty 2

## 2011-07-31 MED ORDER — DEXTROSE 5 % IV SOLN
1000.0000 mg | Freq: Once | INTRAVENOUS | Status: DC
  Filled 2011-07-31: qty 10

## 2011-07-31 MED ORDER — METHOCARBAMOL 500 MG PO TABS
1000.0000 mg | ORAL_TABLET | Freq: Once | ORAL | Status: AC
  Administered 2011-07-31: 1000 mg via ORAL
  Filled 2011-07-31: qty 2

## 2011-07-31 NOTE — Discharge Instructions (Signed)
You have been seen and treated for your back pain today. It is important that you call Dr. Sueanne Margarita office on Monday to let him know you have been having increased pain. You have been given a prescription for Robaxin, a muscle relaxer. You can use this IN PLACE of your Valium - do not take both at the same time. Apply a heating pad over the area that is sore. Return if you develop trouble walking, numbness in the genital area, trouble controlling your bowels or bladder, or otherwise worsening condition.  Back Pain, Adult Low back pain is very common. About 1 in 5 people have back pain.The cause of low back pain is rarely dangerous. The pain often gets better over time.About half of people with a sudden onset of back pain feel better in just 2 weeks. About 8 in 10 people feel better by 6 weeks.  CAUSES Some common causes of back pain include:  Strain of the muscles or ligaments supporting the spine.   Wear and tear (degeneration) of the spinal discs.   Arthritis.   Direct injury to the back.  DIAGNOSIS Most of the time, the direct cause of low back pain is not known.However, back pain can be treated effectively even when the exact cause of the pain is unknown.Answering your caregiver's questions about your overall health and symptoms is one of the most accurate ways to make sure the cause of your pain is not dangerous. If your caregiver needs more information, he or she may order lab work or imaging tests (X-rays or MRIs).However, even if imaging tests show changes in your back, this usually does not require surgery. HOME CARE INSTRUCTIONS For many people, back pain returns.Since low back pain is rarely dangerous, it is often a condition that people can learn to [facility] their own.   Remain active. It is stressful on the back to sit or stand in one place. Do not sit, drive, or stand in one place for more than 30 minutes at a time. Take short walks on level surfaces as soon as pain  allows.Try to increase the length of time you walk each day.   Do not stay in bed.Resting more than 1 or 2 days can delay your recovery.   Do not avoid exercise or work.Your body is made to move.It is not dangerous to be active, even though your back may hurt.Your back will likely heal faster if you return to being active before your pain is gone.   Pay attention to your body when you bend and lift. Many people have less discomfortwhen lifting if they bend their knees, keep the load close to their bodies,and avoid twisting. Often, the most comfortable positions are those that put less stress on your recovering back.   Find a comfortable position to sleep. Use a firm mattress and lie on your side with your knees slightly bent. If you lie on your back, put a pillow under your knees.   Only take over-the-counter or prescription medicines as directed by your caregiver. Over-the-counter medicines to reduce pain and inflammation are often the most helpful.Your caregiver may prescribe muscle relaxant drugs.These medicines help dull your pain so you can more quickly return to your normal activities and healthy exercise.   Put ice on the injured area.   Put ice in a plastic bag.   Place a towel between your skin and the bag.   Leave the ice on for 15 to 20 minutes, 3 to 4 times a day for  the first 2 to 3 days. After that, ice and heat may be alternated to reduce pain and spasms.   Ask your caregiver about trying back exercises and gentle massage. This may be of some benefit.   Avoid feeling anxious or stressed.Stress increases muscle tension and can worsen back pain.It is important to recognize when you are anxious or stressed and learn ways to manage it.Exercise is a great option.  SEEK MEDICAL CARE IF:  You have pain that is not relieved with rest or medicine.   You have pain that does not improve in 1 week.   You have new symptoms.   You are generally not feeling well.  SEEK  IMMEDIATE MEDICAL CARE IF:   You have pain that radiates from your back into your legs.   You develop new bowel or bladder control problems.   You have unusual weakness or numbness in your arms or legs.   You develop nausea or vomiting.   You develop abdominal pain.   You feel faint.  Document Released: 02/07/2005 Document Revised: 01/27/2011 Document Reviewed: 06/28/2010 [facility] Patient Information 2012 [place], [place].

## 2011-07-31 NOTE — ED Notes (Signed)
PT states she had her 6 th back surgery in March.  She went to a follow up appointment last the MD said she was progressing will.  However, pt states that "all the sudden my leg just took off".  Pt sates that percocet is not relieving pain and she has not been able to sleep.  She also took a valium at 7 pm last night.  Pt would also like the MD to look at a bump on her L wrist.

## 2011-07-31 NOTE — ED Provider Notes (Signed)
History     CSN: [id number]  Arrival date & time 07/31/11  0410   First MD Initiated Contact with Patient 07/31/11 0507      Chief Complaint  Patient presents with  . Leg Pain  . Back Pain    (Consider location/radiation/quality/duration/timing/severity/associated sxs/prior treatment) HPI History from patient. 51 year old female with history of multiple surgeries to the low back including discectomy, fusion, and spinal cord stimulator surgery who presents with back pain. She states that her most recent surgery, a fusion, was in March of this year. She had done well after surgery and saw her surgeon, Dr. Franky Macho, in the office earlier this week and was told that she was making satisfactory progress. She states that about 2 days ago, she had increased pain to the area which feels similar to her pain just after the surgery. Pain is described as sharp in nature with radiation down her left thigh. No known aggravating factors. States that the pain sometimes feels better if she gets up and walks around. She has taken her home Percocet and Valium without relief of the pain. She denies any numbness or weakness in the legs, bowel/bladder dysfunction, saddle anesthesia.  Past Medical History  Diagnosis Date  . Anxiety   . Hypertension   . Chronic low back pain 1991    disk s/p 4 diskectomies, 5th fursion, then spinal cord stimulator ([facility])  . Bronchitis     2 weeks ago rx regime  . Depression   . GERD (gastroesophageal reflux disease)   . Arthritis     Past Surgical History  Procedure Date  . Spine surgery 7/ 12 last    5 previous  . Stimulator 12    spine,removed 7/12  . Cesarean section 86  . Lumbar fusion 04/27/2011    Family History  Problem Relation Age of Onset  . Stroke Father   . Hypertension Father     History  Substance Use Topics  . Smoking status: Current Everyday Smoker -- 1.0 packs/day for 30 years    Types: Cigarettes  . Smokeless tobacco: Never Used   Comment: prior trial of zyban made her feel weird   . Alcohol Use: No    OB History    Grav Para Term Preterm Abortions TAB SAB Ect Mult Living                  Review of Systems  Constitutional: Negative for fever, chills, activity change and appetite change.  Respiratory: Negative for cough and shortness of breath.   Cardiovascular: Negative for chest pain and palpitations.  Gastrointestinal: Negative for nausea, vomiting, abdominal pain and diarrhea.  Musculoskeletal: Positive for back pain. Negative for gait problem.  Skin: Negative for color change and rash.  Neurological: Negative for weakness and numbness.    Allergies  Review of patient's allergies indicates no known allergies.  Home Medications   Current Outpatient Rx  Name Route Sig Dispense Refill  . DIAZEPAM 5 MG PO TABS Oral Take 1 tablet (5 mg total) by mouth every 12 (twelve) hours as needed for anxiety. 60 tablet 2  . FLUOXETINE HCL 40 MG PO CAPS Oral Take 1 capsule (40 mg total) by mouth daily. 90 capsule 3  . LANSOPRAZOLE 30 MG PO CPDR Oral Take 30 mg by mouth daily.      Marland Kitchen ONDANSETRON HCL 8 MG PO TABS Oral Take 8 mg by mouth every 8 (eight) hours as needed. For nausea    . OXYCODONE-ACETAMINOPHEN 5-325 MG PO  TABS Oral Take 1 tablet by mouth every 6 (six) hours as needed. For pain    . VALSARTAN-HYDROCHLOROTHIAZIDE 160-12.5 MG PO TABS Oral Take 1 tablet by mouth daily.      BP 136/79  Pulse 89  Temp(Src) 98 F (36.7 C) (Oral)  Resp 18  SpO2 98%  Physical Exam  Nursing note and vitals reviewed. Constitutional: She is oriented to person, place, and time. She appears well-developed and well-nourished. No distress.  HENT:  Head: Normocephalic and atraumatic.  Neck: Normal range of motion.  Cardiovascular: Normal rate.   Pulmonary/Chest: Effort normal.  Musculoskeletal: Normal range of motion.       Spine: No palpable stepoff, crepitus, or gross deformity appreciated. No midline tenderness. Tenderness  over L paravertebral muscles with spasm. Midline surgical incisions noted over lumbar and mid to lower thoracic spine.  Neurological: She is alert and oriented to person, place, and time. No cranial nerve deficit. She exhibits normal muscle tone.       5/5 strength to resisted dorsi/plantar flex, knee flex/ext, hip flex/ext 1+ and equal patellar and achilles reflexes Sensation grossly intact to lt touch Normal gait with ambulation Positive straight leg raise  Skin: Skin is warm and dry. She is not diaphoretic.  Psychiatric: She has a normal mood and affect.    ED Course  Procedures (including critical care time)  Labs Reviewed - No data to display No results found.   1. Chronic low back pain       MDM  Pt feeling better after medication here with Toradol, Robaxin, Percocet. Hx of chronic low back pain, multiple spinal surgeries. Hx, exam reassuring. Pt stable for dc at this time. Rx given for Robaxin which she can try instead of Valium; instructed NOT to take both at same time which she was able to repeat back to me. Pt requesting Percocet - review of [place] Controlled Substance Database indicates that she recently received rx for this from her FP MD so not prescribed. She is to follow up with neurosurgery for further evaluation and treatment and was instructed to call office in AM. Return precautions discussed.        Grant Fontana, [place] 07/31/11 [phone]

## 2011-07-31 NOTE — ED Provider Notes (Signed)
Medical screening examination/treatment/procedure(s) were performed by non-physician practitioner and as supervising physician I was immediately available for consultation/collaboration.  Olivia Mackie, MD 07/31/11 2113

## 2011-07-31 NOTE — ED Notes (Signed)
EDP updated re: pt request for pain med and orders rec'd. Route of robaxin changed to by mouth.

## 2011-08-02 DIAGNOSIS — M545 Low back pain: Secondary | ICD-10-CM | POA: Diagnosis not present

## 2011-09-02 DIAGNOSIS — M546 Pain in thoracic spine: Secondary | ICD-10-CM | POA: Diagnosis not present

## 2011-09-02 DIAGNOSIS — M545 Low back pain: Secondary | ICD-10-CM | POA: Diagnosis not present

## 2011-09-18 ENCOUNTER — Encounter (HOSPITAL_COMMUNITY): Payer: Self-pay | Admitting: Emergency Medicine

## 2011-09-18 ENCOUNTER — Emergency Department (HOSPITAL_COMMUNITY)
Admission: EM | Admit: 2011-09-18 | Discharge: 2011-09-18 | Disposition: A | Discharge status: Home / Self Care | Payer: Medicare Other | Attending: Emergency Medicine | Admitting: Emergency Medicine

## 2011-09-18 DIAGNOSIS — M129 Arthropathy, unspecified: Secondary | ICD-10-CM | POA: Insufficient documentation

## 2011-09-18 DIAGNOSIS — F1910 Other psychoactive substance abuse, uncomplicated: Secondary | ICD-10-CM | POA: Insufficient documentation

## 2011-09-18 DIAGNOSIS — F1120 Opioid dependence, uncomplicated: Secondary | ICD-10-CM

## 2011-09-18 DIAGNOSIS — F17200 Nicotine dependence, unspecified, uncomplicated: Secondary | ICD-10-CM | POA: Insufficient documentation

## 2011-09-18 DIAGNOSIS — I10 Essential (primary) hypertension: Secondary | ICD-10-CM | POA: Insufficient documentation

## 2011-09-18 DIAGNOSIS — K219 Gastro-esophageal reflux disease without esophagitis: Secondary | ICD-10-CM | POA: Insufficient documentation

## 2011-09-18 DIAGNOSIS — G8929 Other chronic pain: Secondary | ICD-10-CM | POA: Insufficient documentation

## 2011-09-18 MED ORDER — LORAZEPAM 1 MG PO TABS: 1.0000 mg | ORAL_TABLET | Freq: Three times a day (TID) | ORAL | Status: AC | PRN

## 2011-09-18 MED ORDER — LORAZEPAM 1 MG PO TABS
1.0000 mg | ORAL_TABLET | Freq: Once | ORAL | Status: AC
  Administered 2011-09-18: 1 mg via ORAL
  Filled 2011-09-18: qty 1

## 2011-09-18 NOTE — ED Provider Notes (Signed)
Medical screening examination/treatment/procedure(s) were performed by non-physician practitioner and as supervising physician I was immediately available for consultation/collaboration.  Olivia Mackie, MD 09/18/11 [phone]

## 2011-09-18 NOTE — ED Notes (Signed)
Pt stated taking pain medication for 20 yrs for hx of 8-10 back surgery. Starting taking suboxone 8/2mg  that was given to her by a friend and run out 2 days ago and now feeing "creepy crawling all over her"  Stated while she was taking suboxone she was not feeling any pain. She said feels like she having withdraws and cannot keep her mind  Focused on anything, minds , stated she is having heavy sweating and  nausea and took Zofran and that help with nausea.  Stated not getting any sleep only slept 2 hrs last night. Stated I am here for Detox, hx of depression and anxiety but denies any SI. She said  " I am tired of looking for pain medication"

## 2011-09-18 NOTE — ED Notes (Signed)
Patient with nausea and insomnia.  Patient states she has been taking suboxone in place of hydrocodones for chronic back pain.  Patient states she has not been taking the suboxone for the last two days, now having nausea, insomnia, feeling jittery.  Patient states that the suboxone was a friend's of hers who gave them to her from a help group.  She states that they have helped her and would like referals on how to get into a "suboxone group" and to help with her jittery feeling.

## 2011-09-18 NOTE — ED Provider Notes (Signed)
History     CSN: [id number]  Arrival date & time 09/18/11  1478   First MD Initiated Contact with Patient 09/18/11 [phone]      Chief Complaint  Patient presents with  . Nausea  . Insomnia    (Consider location/radiation/quality/duration/timing/severity/associated sxs/prior treatment) HPI Comments: Patient with chronic back pain s/p multiple surgeries.  Pt states she has been taking pain medications (vicodin, percocet, etc) on and off for 20 years for her back.  States recently she was given suboxone by a friend because she was withdrawing from vicodin and she decided this was much better than what she had been doing - her pain was controlled and she felt clear-headed and able to function.  States she ran out of her friend's pills a few days ago and is now having withdrawal symptoms including insomnia, nausea, diarrhea, heart racing, "creepy crawly" sensation on skin.  PCP is Renaye Rakers- she has not been to see her about this issue.  Would like to get into a pain clinic that uses suboxone.  States her back pain is well controlled currently, 2/10 intensity, radiates down left leg "occasionally" with mild weakness and numbness, unchanged from her baseline.  Denies fevers, loss of control of bowel or bladder, difficulty ambulating.    The history is provided by the patient.    Past Medical History  Diagnosis Date  . Anxiety   . Hypertension   . Chronic low back pain 1991    disk s/p 4 diskectomies, 5th fursion, then spinal cord stimulator ([facility])  . Bronchitis     2 weeks ago rx regime  . Depression   . GERD (gastroesophageal reflux disease)   . Arthritis     Past Surgical History  Procedure Date  . Spine surgery 7/ 12 last    5 previous  . Stimulator 12    spine,removed 7/12  . Cesarean section 84  . Lumbar fusion 04/27/2011    Family History  Problem Relation Age of Onset  . Stroke Father   . Hypertension Father     History  Substance Use Topics  . Smoking status:  Current Everyday Smoker -- 1.0 packs/day for 30 years    Types: Cigarettes  . Smokeless tobacco: Never Used   Comment: prior trial of zyban made her feel weird   . Alcohol Use: No    OB History    Grav Para Term Preterm Abortions TAB SAB Ect Mult Living                  Review of Systems  Constitutional: Negative for fever, chills and appetite change.  Cardiovascular: Positive for palpitations. Negative for chest pain.  Gastrointestinal: Positive for nausea and diarrhea. Negative for vomiting and abdominal pain.  Musculoskeletal: Positive for back pain. Negative for gait problem.  Psychiatric/Behavioral: Positive for disturbed wake/sleep cycle. Negative for suicidal ideas and confusion.    Allergies  Review of patient's allergies indicates no known allergies.  Home Medications   Current Outpatient Rx  Name Route Sig Dispense Refill  . BUPRENORPHINE HCL-NALOXONE HCL 8-2 MG SL SUBL Sublingual Place 1 tablet under the tongue daily.    Marland Kitchen DIAZEPAM 5 MG PO TABS Oral Take 1 tablet (5 mg total) by mouth every 12 (twelve) hours as needed for anxiety. 60 tablet 2  . FLUOXETINE HCL 40 MG PO CAPS Oral Take 1 capsule (40 mg total) by mouth daily. 90 capsule 3  . IBUPROFEN 200 MG PO TABS Oral Take 800 mg by  mouth every 6 (six) hours as needed. For pain    . LANSOPRAZOLE 30 MG PO CPDR Oral Take 30 mg by mouth daily.      Marland Kitchen ONDANSETRON HCL 8 MG PO TABS Oral Take 8 mg by mouth every 8 (eight) hours as needed. For nausea    . VALSARTAN-HYDROCHLOROTHIAZIDE 160-12.5 MG PO TABS Oral Take 1 tablet by mouth daily.      BP 176/98  Pulse 75  Temp 97.6 F (36.4 C) (Oral)  Resp 16  SpO2 99%  Physical Exam  Nursing note and vitals reviewed. Constitutional: She appears well-developed and well-nourished. No distress.  HENT:  Head: Normocephalic and atraumatic.  Neck: Neck supple.  Cardiovascular: Normal rate and regular rhythm.   Pulmonary/Chest: Effort normal and breath sounds normal. No  respiratory distress. She has no wheezes. She has no rales.  Abdominal: Soft. She exhibits no distension. There is no tenderness. There is no rebound and no guarding.  Musculoskeletal:       Cervical back: She exhibits no bony tenderness.       Thoracic back: She exhibits no bony tenderness.       Lumbar back: She exhibits no bony tenderness.       Lower extremities:  Strength 5/5 (left slightly decreased compared to right), sensation intact(left slightly decreased compared to right), distal pulses intact, no edema.     Neurological: She is alert.  Skin: She is not diaphoretic.    ED Course  Procedures (including critical care time)  Labs Reviewed - No data to display No results found.  7:14 AM  Discussed patient with Dr Norlene Campbell.    1. Narcotic dependence       MDM  Pt with chronic narcotic dependence, recently having switched to suboxone (not her own prescription), requesting help with pain clinic placement or a prescription for suboxone.  As patient has a PCP she can follow up with, patient does not want detox or admission, pt discharged with PCP follow up.  Pt given ativan for withdrawal symptoms.  Pt may discuss chronic pain management with her doctor and have PCP help her with pain clinic placement.   Pt given return precautions.  Pt verbalizes understanding and agrees with plan.           [place], [place] 09/18/11 [phone]

## 2011-09-23 DIAGNOSIS — Z1231 Encounter for screening mammogram for malignant neoplasm of breast: Secondary | ICD-10-CM | POA: Diagnosis not present

## 2011-10-17 DIAGNOSIS — M48061 Spinal stenosis, lumbar region without neurogenic claudication: Secondary | ICD-10-CM | POA: Diagnosis not present

## 2011-11-02 DIAGNOSIS — M545 Low back pain: Secondary | ICD-10-CM | POA: Diagnosis not present

## 2011-11-06 ENCOUNTER — Emergency Department (HOSPITAL_COMMUNITY): Payer: Medicare Other

## 2011-11-06 ENCOUNTER — Emergency Department (HOSPITAL_COMMUNITY)
Admission: EM | Admit: 2011-11-06 | Discharge: 2011-11-06 | Disposition: A | Discharge status: Home / Self Care | Payer: Medicare Other | Attending: Emergency Medicine | Admitting: Emergency Medicine

## 2011-11-06 ENCOUNTER — Encounter (HOSPITAL_COMMUNITY): Payer: Self-pay

## 2011-11-06 DIAGNOSIS — R079 Chest pain, unspecified: Secondary | ICD-10-CM | POA: Diagnosis not present

## 2011-11-06 DIAGNOSIS — I10 Essential (primary) hypertension: Secondary | ICD-10-CM | POA: Insufficient documentation

## 2011-11-06 DIAGNOSIS — K219 Gastro-esophageal reflux disease without esophagitis: Secondary | ICD-10-CM | POA: Diagnosis not present

## 2011-11-06 DIAGNOSIS — F329 Major depressive disorder, single episode, unspecified: Secondary | ICD-10-CM | POA: Insufficient documentation

## 2011-11-06 DIAGNOSIS — Z823 Family history of stroke: Secondary | ICD-10-CM | POA: Insufficient documentation

## 2011-11-06 DIAGNOSIS — Z8249 Family history of ischemic heart disease and other diseases of the circulatory system: Secondary | ICD-10-CM | POA: Insufficient documentation

## 2011-11-06 DIAGNOSIS — F3289 Other specified depressive episodes: Secondary | ICD-10-CM | POA: Insufficient documentation

## 2011-11-06 DIAGNOSIS — M549 Dorsalgia, unspecified: Secondary | ICD-10-CM | POA: Insufficient documentation

## 2011-11-06 DIAGNOSIS — F411 Generalized anxiety disorder: Secondary | ICD-10-CM | POA: Insufficient documentation

## 2011-11-06 DIAGNOSIS — R05 Cough: Secondary | ICD-10-CM | POA: Diagnosis not present

## 2011-11-06 DIAGNOSIS — M546 Pain in thoracic spine: Secondary | ICD-10-CM | POA: Diagnosis not present

## 2011-11-06 DIAGNOSIS — G8929 Other chronic pain: Secondary | ICD-10-CM | POA: Insufficient documentation

## 2011-11-06 DIAGNOSIS — J449 Chronic obstructive pulmonary disease, unspecified: Secondary | ICD-10-CM | POA: Diagnosis not present

## 2011-11-06 DIAGNOSIS — F17200 Nicotine dependence, unspecified, uncomplicated: Secondary | ICD-10-CM | POA: Insufficient documentation

## 2011-11-06 MED ORDER — KETOROLAC TROMETHAMINE 60 MG/2ML IM SOLN
60.0000 mg | Freq: Once | INTRAMUSCULAR | Status: AC
  Administered 2011-11-06: 60 mg via INTRAMUSCULAR
  Filled 2011-11-06: qty 2

## 2011-11-06 MED ORDER — PROMETHAZINE HCL 25 MG PO TABS
25.0000 mg | ORAL_TABLET | Freq: Once | ORAL | Status: AC
  Administered 2011-11-06: 25 mg via ORAL
  Filled 2011-11-06 (×2): qty 1

## 2011-11-06 MED ORDER — HYDROMORPHONE HCL PF 1 MG/ML IJ SOLN
1.0000 mg | Freq: Once | INTRAMUSCULAR | Status: AC
  Administered 2011-11-06: 1 mg via INTRAMUSCULAR
  Filled 2011-11-06: qty 1

## 2011-11-06 NOTE — ED Provider Notes (Signed)
Date: 11/06/2011  Rate: 89  Rhythm: normal sinus rhythm  QRS Axis: normal  Intervals: normal QRS:  Poor R wave progression in precordial leads suggests possible old anterior myocardial infarction.  ST/T Wave abnormalities: normal  Conduction Disutrbances:none  Narrative Interpretation: Abnormal EKG  Old EKG Reviewed: unchanged    Carleene Cooper III, MD 11/06/11 1006

## 2011-11-06 NOTE — ED Notes (Signed)
Pt undressed, in gown, on monitor, continuous pulse oximetry and blood pressure cuff; EKG performed 

## 2011-11-06 NOTE — ED Notes (Signed)
Pt with h/o of chronic back pain.  Pt reports increasing back pain last night and causing pain in both legs.  Pt feels like her "legs are crazy".  Pt states she can "feel every screw in her back".  Pt has taken her home meds with little to no relief.  Pt reports she is supposed to start following with a pain mgmt doctor.  Pt states she ran out of her Norco tablets last week.  Pt took toradol, valium, and 500mg  of Naproxen at 0500.

## 2011-11-06 NOTE — ED Provider Notes (Signed)
History     CSN: [id number]  Arrival date & time 11/06/11  0845   First MD Initiated Contact with Patient 11/06/11 [phone]      Chief Complaint  Patient presents with  . Back Pain  . Leg Pain    (Consider location/radiation/quality/duration/timing/severity/associated sxs/prior treatment) HPI Comments: Patient is a 51 year old female with a history of chronic back pain, narcotic dependence, and bronchitis that presents emergency department with multiple chief complaints.  She reports that she is here primarily for her back pain.  She has taken Valium, tramadol and naproxen without relief.  Pain is rated at 10/10 and located primarily in the upper back.  She denies any radiation of pain or weakness numbness or tingling of extremities, saddle paresthesias, recent trauma.  In addition patient reports nausea and emesis times one that occurred last evening as well as cough and nasal congestion.  Patient denies any fevers, night sweats, chills, dysphasia, chest pain, shortness of breath, dyspnea on exertion.  Patient reports that Dr. Ezequiel Essex is supposed to be setting her up with chronic pain management, but she has not started yet.  The history is provided by the patient.    Past Medical History  Diagnosis Date  . Anxiety   . Hypertension   . Chronic low back pain 1991    disk s/p 4 diskectomies, 5th fursion, then spinal cord stimulator ([facility])  . Bronchitis     2 weeks ago rx regime  . Depression   . GERD (gastroesophageal reflux disease)   . Arthritis     Past Surgical History  Procedure Date  . Spine surgery 7/ 12 last    5 previous  . Stimulator 12    spine,removed 7/12  . Cesarean section 86  . Lumbar fusion 04/27/2011    Family History  Problem Relation Age of Onset  . Stroke Father   . Hypertension Father     History  Substance Use Topics  . Smoking status: Current Every Day Smoker -- 1.0 packs/day for 30 years    Types: Cigarettes  . Smokeless tobacco: Never Used   Comment: prior trial of zyban made her feel weird   . Alcohol Use: No    OB History    Grav Para Term Preterm Abortions TAB SAB Ect Mult Living                  Review of Systems  Constitutional: Negative for fever, chills and appetite change.  HENT: Positive for congestion.   Eyes: Negative for visual disturbance.  Respiratory: Positive for cough. Negative for shortness of breath.   Cardiovascular: Negative for chest pain and leg swelling.  Gastrointestinal: Positive for nausea and vomiting. Negative for abdominal pain, diarrhea, constipation, blood in stool and abdominal distention.  Genitourinary: Negative for dysuria, urgency and frequency.  Musculoskeletal: Positive for back pain.  Neurological: Negative for dizziness, syncope, weakness, light-headedness, numbness and headaches.  Psychiatric/Behavioral: Negative for confusion.  All other systems reviewed and are negative.    Allergies  Review of patient's allergies indicates no known allergies.  Home Medications   Current Outpatient Rx  Name Route Sig Dispense Refill  . DIAZEPAM 5 MG PO TABS Oral Take 1 tablet (5 mg total) by mouth every 12 (twelve) hours as needed for anxiety. 60 tablet 2  . FLUOXETINE HCL 40 MG PO CAPS Oral Take 1 capsule (40 mg total) by mouth daily. 90 capsule 3  . HYDROCODONE-ACETAMINOPHEN 7.5-325 MG PO TABS Oral Take 2 tablets by mouth every  6 (six) hours as needed. For pain    . LANSOPRAZOLE 30 MG PO CPDR Oral Take 30 mg by mouth daily.      Marland Kitchen NAPROXEN 500 MG PO TABS Oral Take 500 mg by mouth 2 (two) times daily as needed. For pain    . ONDANSETRON HCL 8 MG PO TABS Oral Take 8 mg by mouth every 8 (eight) hours as needed. For nausea    . VALSARTAN-HYDROCHLOROTHIAZIDE 160-12.5 MG PO TABS Oral Take 1 tablet by mouth daily.      BP 141/82  Pulse 83  Temp 98.3 F (36.8 C) (Oral)  Resp 18  Ht 5\' 7"  (1.702 m)  Wt 187 lb (84.823 kg)  BMI 29.29 kg/m2  SpO2 96%  LMP 10/31/2011  Physical Exam    Nursing note and vitals reviewed. Constitutional: She is oriented to person, place, and time. She appears well-developed and well-nourished. No distress.  HENT:  Head: Normocephalic and atraumatic.  Eyes: Conjunctivae normal and EOM are normal. Pupils are equal, round, and reactive to light. No scleral icterus.  Neck: Normal range of motion and full passive range of motion without pain. Neck supple. No spinous process tenderness and no muscular tenderness present. No rigidity. Normal range of motion present. No Brudzinski's sign noted.  Cardiovascular: Normal rate, regular rhythm and intact distal pulses.  Exam reveals no gallop and no friction rub.   No murmur heard. Pulmonary/Chest: Effort normal. No respiratory distress. She has wheezes. She has no rales. She exhibits no tenderness.  Musculoskeletal:       Cervical back: She exhibits normal range of motion, no tenderness, no bony tenderness and no pain.       Thoracic back: She exhibits no tenderness, no bony tenderness and no pain.       Lumbar back: She exhibits tenderness, bony tenderness and pain. She exhibits no spasm and normal pulse.       Right foot: She exhibits no swelling.       Left foot: She exhibits no swelling.       Bilateral lower extremities nontender without color change, baseline range of motion of extremities with intact distal pulses, capillary refill less than 2 seconds bilaterally.  Pt has increased pain w spine movement. Ambulates with cane.   Neurological: She is alert and oriented to person, place, and time. She has normal strength and normal reflexes. No sensory deficit.       Sensation at baseline for light touch in all 4 distal extremities, motor symmetric & bilateral 5/5   Skin: Skin is warm and dry. No rash noted. She is not diaphoretic. No erythema. No pallor.  Psychiatric: She has a normal mood and affect.    ED Course  Procedures (including critical care time)  Labs Reviewed - No data to display Dg  Chest 2 View  11/06/2011  *RADIOLOGY REPORT*  Clinical Data: Cough and chest pain  CHEST - 2 VIEW  Comparison: 11/07/2010  Findings: Upper normal heart size.  Normal vascularity.  No mass or consolidation.  Bronchitic changes.  Interstitial prominence. Hyperaeration.  No pneumothorax.  IMPRESSION: Changes related to COPD. No active cardiopulmonary disease.   Original Report Authenticated By: Donavan Burnet, M.D.      No diagnosis found.    MDM  Chronic back pain & URI s/s  Patient with chronic back pain & narcotic dependence. Pain managed in ER & discussed need to set up with chronic pain mngt. SHe indicates that this is already in the  process.  No neurological deficits and normal neuro exam.  No loss of bowel or bladder control.  No concern for cauda equina.  No fever, night sweats, weight loss, h/o cancer, IVDU.  RICE protocol and pain medicine indicated and discussed with patient. In addition pt presented w URI s/s likely viral etiology. Pt CXR negative for acute infiltrateDiscussed that antibiotics are not indicated for viral infections. Pt will be discharged with symptomatic treatment .  Verbalizes understanding and is agreeable with plan. Pt is hemodynamically stable & in NAD prior to dc.           Jaci Carrel, [place] 11/06/11 1218

## 2011-11-08 NOTE — ED Provider Notes (Signed)
Medical screening examination/treatment/procedure(s) were performed by non-physician practitioner and as supervising physician I was immediately available for consultation/collaboration.   Carleene Cooper III, MD 11/08/11 1030

## 2011-11-23 DIAGNOSIS — M898X9 Other specified disorders of bone, unspecified site: Secondary | ICD-10-CM | POA: Diagnosis not present

## 2011-11-23 DIAGNOSIS — Z981 Arthrodesis status: Secondary | ICD-10-CM | POA: Diagnosis not present

## 2011-12-06 DIAGNOSIS — Z23 Encounter for immunization: Secondary | ICD-10-CM | POA: Diagnosis not present

## 2011-12-16 DIAGNOSIS — M48061 Spinal stenosis, lumbar region without neurogenic claudication: Secondary | ICD-10-CM | POA: Diagnosis not present

## 2011-12-16 DIAGNOSIS — Z79899 Other long term (current) drug therapy: Secondary | ICD-10-CM | POA: Diagnosis not present

## 2012-01-09 DIAGNOSIS — Z885 Allergy status to narcotic agent status: Secondary | ICD-10-CM | POA: Diagnosis not present

## 2012-01-09 DIAGNOSIS — M549 Dorsalgia, unspecified: Secondary | ICD-10-CM | POA: Diagnosis not present

## 2012-01-09 DIAGNOSIS — R0989 Other specified symptoms and signs involving the circulatory and respiratory systems: Secondary | ICD-10-CM | POA: Diagnosis not present

## 2012-01-09 DIAGNOSIS — R0609 Other forms of dyspnea: Secondary | ICD-10-CM | POA: Diagnosis not present

## 2012-01-09 DIAGNOSIS — F518 Other sleep disorders not due to a substance or known physiological condition: Secondary | ICD-10-CM | POA: Diagnosis not present

## 2012-01-10 DIAGNOSIS — G4710 Hypersomnia, unspecified: Secondary | ICD-10-CM | POA: Diagnosis not present

## 2012-02-05 ENCOUNTER — Emergency Department (HOSPITAL_COMMUNITY)
Admission: EM | Admit: 2012-02-05 | Discharge: 2012-02-05 | Disposition: A | Discharge status: Home / Self Care | Payer: Medicare Other | Attending: Emergency Medicine | Admitting: Emergency Medicine

## 2012-02-05 ENCOUNTER — Encounter (HOSPITAL_COMMUNITY): Payer: Self-pay | Admitting: Emergency Medicine

## 2012-02-05 ENCOUNTER — Emergency Department (HOSPITAL_COMMUNITY): Payer: Medicare Other

## 2012-02-05 DIAGNOSIS — Z8709 Personal history of other diseases of the respiratory system: Secondary | ICD-10-CM | POA: Insufficient documentation

## 2012-02-05 DIAGNOSIS — M549 Dorsalgia, unspecified: Secondary | ICD-10-CM

## 2012-02-05 DIAGNOSIS — Z9889 Other specified postprocedural states: Secondary | ICD-10-CM | POA: Insufficient documentation

## 2012-02-05 DIAGNOSIS — IMO0002 Reserved for concepts with insufficient information to code with codable children: Secondary | ICD-10-CM | POA: Diagnosis not present

## 2012-02-05 DIAGNOSIS — I10 Essential (primary) hypertension: Secondary | ICD-10-CM | POA: Diagnosis not present

## 2012-02-05 DIAGNOSIS — F3289 Other specified depressive episodes: Secondary | ICD-10-CM | POA: Insufficient documentation

## 2012-02-05 DIAGNOSIS — Z79899 Other long term (current) drug therapy: Secondary | ICD-10-CM | POA: Insufficient documentation

## 2012-02-05 DIAGNOSIS — K219 Gastro-esophageal reflux disease without esophagitis: Secondary | ICD-10-CM | POA: Diagnosis not present

## 2012-02-05 DIAGNOSIS — Z8739 Personal history of other diseases of the musculoskeletal system and connective tissue: Secondary | ICD-10-CM | POA: Insufficient documentation

## 2012-02-05 DIAGNOSIS — F329 Major depressive disorder, single episode, unspecified: Secondary | ICD-10-CM | POA: Insufficient documentation

## 2012-02-05 DIAGNOSIS — Y9389 Activity, other specified: Secondary | ICD-10-CM | POA: Insufficient documentation

## 2012-02-05 DIAGNOSIS — Y9289 Other specified places as the place of occurrence of the external cause: Secondary | ICD-10-CM | POA: Insufficient documentation

## 2012-02-05 DIAGNOSIS — S335XXA Sprain of ligaments of lumbar spine, initial encounter: Secondary | ICD-10-CM | POA: Insufficient documentation

## 2012-02-05 DIAGNOSIS — F17200 Nicotine dependence, unspecified, uncomplicated: Secondary | ICD-10-CM | POA: Diagnosis not present

## 2012-02-05 DIAGNOSIS — W010XXA Fall on same level from slipping, tripping and stumbling without subsequent striking against object, initial encounter: Secondary | ICD-10-CM | POA: Insufficient documentation

## 2012-02-05 DIAGNOSIS — F411 Generalized anxiety disorder: Secondary | ICD-10-CM | POA: Insufficient documentation

## 2012-02-05 DIAGNOSIS — S39012A Strain of muscle, fascia and tendon of lower back, initial encounter: Secondary | ICD-10-CM

## 2012-02-05 MED ORDER — OXYCODONE-ACETAMINOPHEN 5-325 MG PO TABS: 1.0000 | ORAL_TABLET | Freq: Four times a day (QID) | ORAL | Status: DC | PRN

## 2012-02-05 MED ORDER — HYDROMORPHONE HCL PF 2 MG/ML IJ SOLN
2.0000 mg | Freq: Once | INTRAMUSCULAR | Status: AC
  Administered 2012-02-05: 2 mg via INTRAMUSCULAR
  Filled 2012-02-05: qty 1

## 2012-02-05 MED ORDER — HYDROMORPHONE HCL PF 1 MG/ML IJ SOLN
1.0000 mg | Freq: Once | INTRAMUSCULAR | Status: AC
  Administered 2012-02-05: 1 mg via INTRAMUSCULAR
  Filled 2012-02-05: qty 1

## 2012-02-05 MED ORDER — KETOROLAC TROMETHAMINE 30 MG/ML IJ SOLN
30.0000 mg | Freq: Once | INTRAMUSCULAR | Status: AC
  Administered 2012-02-05: 30 mg via INTRAMUSCULAR
  Filled 2012-02-05: qty 1

## 2012-02-05 NOTE — ED Provider Notes (Signed)
History   This chart was scribed for Madeline Roots, MD by Madeline Dickerson, ED Scribe. The patient was seen in room TR10C/TR10C and the patient's care was started at 11:56AM.    CSN: [id number]  Arrival date & time 02/05/12  1134   First MD Initiated Contact with Patient 02/05/12 1154      Chief Complaint  Patient presents with  . Back Pain  . Fall    (Consider location/radiation/quality/duration/timing/severity/associated sxs/prior treatment) The history is provided by the patient. No language interpreter was used.   Madeline Dickerson is a 51 y.o. female who presents to the Emergency Department complaining of constant, moderate to severe lower back pain pertaining to a fall with an onset an hour ago.  She reports falling flat on her back after slipping on some wet leaves outside and falling in the yard which was a "soft" surface. She has a Hx of chronic back pain, but she denies new extremity numbness or weakness. She has never experienced pain like this in the past. She normally takes percocet 10 in the past for her chronic back pain. She has not taken any pain meds before visiting the ED today; she reports coming straight to the ED after her fall due to the severity of the pain. She has a Hx of lumbar back surgery. No known allergies. No other pertinent medical symptoms.  Hx chronic back pain, but worse post fall.  Back surgeon: Madeline Dickerson  Past Medical History  Diagnosis Date  . Anxiety   . Hypertension   . Chronic low back pain 1991    disk s/p 4 diskectomies, 5th fursion, then spinal cord stimulator ([facility])  . Bronchitis     2 weeks ago rx regime  . Depression   . GERD (gastroesophageal reflux disease)   . Arthritis     Past Surgical History  Procedure Date  . Spine surgery 7/ 12 last    5 previous  . Stimulator 10    spine,removed 7/12  . Cesarean section 84  . Lumbar fusion 04/27/2011    Family History  Problem Relation Age of Onset  . Stroke Father   .  Hypertension Father     History  Substance Use Topics  . Smoking status: Current Every Day Smoker -- 1.0 packs/day for 30 years    Types: Cigarettes  . Smokeless tobacco: Never Used     Comment: prior trial of zyban made her feel weird   . Alcohol Use: No    OB History    Grav Para Term Preterm Abortions TAB SAB Ect Mult Living                  Review of Systems  Musculoskeletal: Positive for back pain.  10 Systems reviewed and all are negative for acute change except as noted in the HPI.    Allergies  Review of patient's allergies indicates no known allergies.  Home Medications   Current Outpatient Rx  Name  Route  Sig  Dispense  Refill  . DIAZEPAM 5 MG PO TABS   Oral   Take 1 tablet (5 mg total) by mouth every 12 (twelve) hours as needed for anxiety.   60 tablet   2   . FLUOXETINE HCL 40 MG PO CAPS   Oral   Take 1 capsule (40 mg total) by mouth daily.   90 capsule   3   . HYDROCODONE-ACETAMINOPHEN 7.5-325 MG PO TABS   Oral   Take 2  tablets by mouth every 6 (six) hours as needed. For pain         . LANSOPRAZOLE 30 MG PO CPDR   Oral   Take 30 mg by mouth daily.           Marland Kitchen NAPROXEN 500 MG PO TABS   Oral   Take 500 mg by mouth 2 (two) times daily as needed. For pain         . ONDANSETRON HCL 8 MG PO TABS   Oral   Take 8 mg by mouth every 8 (eight) hours as needed. For nausea         . VALSARTAN-HYDROCHLOROTHIAZIDE 160-12.5 MG PO TABS   Oral   Take 1 tablet by mouth daily.           BP 159/91  Pulse 105  Temp 97.8 F (36.6 C) (Oral)  Resp 22  SpO2 97%  Physical Exam  Nursing note and vitals reviewed. Constitutional: She is oriented to person, place, and time.       Awake, alert, nontoxic appearance.  HENT:  Head: Atraumatic.  Eyes: Right eye exhibits no discharge. Left eye exhibits no discharge.  Neck: Normal range of motion. Neck supple.  Cardiovascular: Normal rate.   Pulmonary/Chest: Effort normal and breath sounds normal.  She exhibits no tenderness.  Abdominal: Soft. She exhibits no distension. There is no tenderness. There is no rebound.  Musculoskeletal: She exhibits tenderness.       Diffuse lumbar tenderness, otherwise CTLS spine, non tender, aligned, no step off.   Neurological: She is alert and oriented to person, place, and time.       Mental status and motor strength appears baseline for patient and situation. Steady gait  Skin: No rash noted.  Psychiatric: She has a normal mood and affect.    ED Course  Procedures (including critical care time)  COORDINATION OF CARE:  11:58AM - dilaudid, toradol, and lumbar spine XR will be ordered for Madeline Dickerson.    Dg Lumbar Spine Complete  02/05/2012  *RADIOLOGY REPORT*  Clinical Data: Fall, back pain  LUMBAR SPINE - COMPLETE 4+ VIEW  Comparison: A well as lumbar spine 08/26 1013  Findings: There is a posterior cervical fusion from L3-S1 with pedicle screws and fusion rods.  No evidence of hardware failure. Interbody spacer at L4-L5 and L3-L4.  No acute subluxation.  IMPRESSION:  1.  No acute findings in the lumbar spine. 2.  Stable posterior lumbar fusion.   Original Report Authenticated By: Genevive Bi, M.D.         MDM  I personally performed the services described in this documentation, which was scribed in my presence. The recorded information has been reviewed and is accurate.  Pt requests shot of toradol and dilaudid - states has worked well in past. States has plan to see her ns tomorrow.   Dilaudid im, toradol im. Xr.  Reviewed nursing notes and prior charts for additional history.          Madeline Roots, MD 02/05/12 [phone]

## 2012-02-05 NOTE — ED Notes (Signed)
Pt c/o upper back pain after slipping on leaves and falling in yard; pt sts hx of back sx in past

## 2012-02-28 DIAGNOSIS — M48061 Spinal stenosis, lumbar region without neurogenic claudication: Secondary | ICD-10-CM | POA: Diagnosis not present

## 2012-03-19 ENCOUNTER — Other Ambulatory Visit: Payer: Self-pay | Admitting: Neurosurgery

## 2012-05-06 ENCOUNTER — Encounter (HOSPITAL_COMMUNITY): Payer: Self-pay | Admitting: Family Medicine

## 2012-05-06 ENCOUNTER — Emergency Department (HOSPITAL_COMMUNITY): Payer: Medicare Other

## 2012-05-06 ENCOUNTER — Emergency Department (HOSPITAL_COMMUNITY)
Admission: EM | Admit: 2012-05-06 | Discharge: 2012-05-07 | Disposition: A | Discharge status: Home / Self Care | Payer: Medicare Other | Attending: Emergency Medicine | Admitting: Emergency Medicine

## 2012-05-06 DIAGNOSIS — R197 Diarrhea, unspecified: Secondary | ICD-10-CM | POA: Diagnosis not present

## 2012-05-06 DIAGNOSIS — M545 Low back pain, unspecified: Secondary | ICD-10-CM | POA: Insufficient documentation

## 2012-05-06 DIAGNOSIS — Z79899 Other long term (current) drug therapy: Secondary | ICD-10-CM | POA: Diagnosis not present

## 2012-05-06 DIAGNOSIS — R5381 Other malaise: Secondary | ICD-10-CM | POA: Insufficient documentation

## 2012-05-06 DIAGNOSIS — R109 Unspecified abdominal pain: Secondary | ICD-10-CM | POA: Diagnosis not present

## 2012-05-06 DIAGNOSIS — F3289 Other specified depressive episodes: Secondary | ICD-10-CM | POA: Insufficient documentation

## 2012-05-06 DIAGNOSIS — F411 Generalized anxiety disorder: Secondary | ICD-10-CM | POA: Diagnosis not present

## 2012-05-06 DIAGNOSIS — F329 Major depressive disorder, single episode, unspecified: Secondary | ICD-10-CM | POA: Diagnosis not present

## 2012-05-06 DIAGNOSIS — R112 Nausea with vomiting, unspecified: Secondary | ICD-10-CM | POA: Diagnosis not present

## 2012-05-06 DIAGNOSIS — K5289 Other specified noninfective gastroenteritis and colitis: Secondary | ICD-10-CM | POA: Diagnosis not present

## 2012-05-06 DIAGNOSIS — Z8709 Personal history of other diseases of the respiratory system: Secondary | ICD-10-CM | POA: Insufficient documentation

## 2012-05-06 DIAGNOSIS — I10 Essential (primary) hypertension: Secondary | ICD-10-CM | POA: Diagnosis not present

## 2012-05-06 DIAGNOSIS — G8929 Other chronic pain: Secondary | ICD-10-CM | POA: Insufficient documentation

## 2012-05-06 DIAGNOSIS — K219 Gastro-esophageal reflux disease without esophagitis: Secondary | ICD-10-CM | POA: Diagnosis not present

## 2012-05-06 DIAGNOSIS — K529 Noninfective gastroenteritis and colitis, unspecified: Secondary | ICD-10-CM

## 2012-05-06 DIAGNOSIS — Z8739 Personal history of other diseases of the musculoskeletal system and connective tissue: Secondary | ICD-10-CM | POA: Diagnosis not present

## 2012-05-06 DIAGNOSIS — F17200 Nicotine dependence, unspecified, uncomplicated: Secondary | ICD-10-CM | POA: Diagnosis not present

## 2012-05-06 LAB — URINALYSIS, ROUTINE W REFLEX MICROSCOPIC
Bilirubin Urine: NEGATIVE
Glucose, UA: NEGATIVE mg/dL
Hgb urine dipstick: NEGATIVE
Ketones, ur: NEGATIVE mg/dL
Leukocytes, UA: NEGATIVE
Nitrite: NEGATIVE
Protein, ur: NEGATIVE mg/dL
Specific Gravity, Urine: 1.009 (ref 1.005–1.030)
Urobilinogen, UA: 0.2 mg/dL (ref 0.0–1.0)
pH: 6 (ref 5.0–8.0)

## 2012-05-06 LAB — POCT I-STAT, CHEM 8
BUN: 12 mg/dL (ref 6–23)
Calcium, Ion: 1.22 mmol/L (ref 1.12–1.23)
Chloride: 109 meq/L (ref 96–112)
Creatinine, Ser: 0.6 mg/dL (ref 0.50–1.10)
Glucose, Bld: 96 mg/dL (ref 70–99)
HCT: 40 % (ref 36.0–46.0)
Hemoglobin: 13.6 g/dL (ref 12.0–15.0)
Potassium: 3.8 meq/L (ref 3.5–5.1)
Sodium: 143 meq/L (ref 135–145)
TCO2: 22 mmol/L (ref 0–100)

## 2012-05-06 MED ORDER — HYDROMORPHONE HCL PF 1 MG/ML IJ SOLN
1.0000 mg | Freq: Once | INTRAMUSCULAR | Status: AC
  Administered 2012-05-06: 1 mg via INTRAVENOUS
  Filled 2012-05-06: qty 1

## 2012-05-06 MED ORDER — SODIUM CHLORIDE 0.9 % IV BOLUS (SEPSIS)
2000.0000 mL | Freq: Once | INTRAVENOUS | Status: AC
  Administered 2012-05-06: 2000 mL via INTRAVENOUS

## 2012-05-06 NOTE — ED Provider Notes (Signed)
History     CSN: [id number]  Arrival date & time 05/06/12  1732   First MD Initiated Contact with Patient 05/06/12 1755      Chief Complaint  Patient presents with  . Nausea  . Emesis    (Consider location/radiation/quality/duration/timing/severity/associated sxs/prior treatment) HPI Complains of nausea vomiting and diarrhea onset yesterday. Patient has had approximately 4 episodes of vomiting and 6 episodes of diarrhea. Associated symptoms include low crampy abdominal pain and low back pain, nonradiating. She also complains of generalized weakness. Abdominal pain is minimal at present, constant. Back pain is severe and worse with movement typical of chronic back pain she's had in the past. No loss of bladder or bowel control uncertain if she's had a fever. she has treated herself with Phenergan with partial relief of nausea. Past Medical History  Diagnosis Date  . Anxiety   . Hypertension   . Chronic low back pain 1991    disk s/p 4 diskectomies, 5th fursion, then spinal cord stimulator ([facility])  . Bronchitis     2 weeks ago rx regime  . Depression   . GERD (gastroesophageal reflux disease)   . Arthritis     Past Surgical History  Procedure Laterality Date  . Spine surgery  7/ 12 last    5 previous  . Stimulator  12    spine,removed 7/12  . Cesarean section  84  . Lumbar fusion  04/27/2011    Family History  Problem Relation Age of Onset  . Stroke Father   . Hypertension Father     History  Substance Use Topics  . Smoking status: Current Every Day Smoker -- 1.00 packs/day for 30 years    Types: Cigarettes  . Smokeless tobacco: Never Used     Comment: prior trial of zyban made her feel weird   . Alcohol Use: No    OB History   Grav Para Term Preterm Abortions TAB SAB Ect Mult Living                  Review of Systems  HENT: Negative.   Respiratory: Negative.   Cardiovascular: Negative.   Gastrointestinal: Positive for nausea, vomiting, abdominal pain  and diarrhea. Negative for blood in stool.  Genitourinary:       Postmenopausal  Musculoskeletal: Positive for back pain.  Skin: Negative.   Neurological: Positive for weakness.  Psychiatric/Behavioral: Negative.   All other systems reviewed and are negative.    Allergies  Review of patient's allergies indicates no known allergies.  Home Medications   Current Outpatient Rx  Name  Route  Sig  Dispense  Refill  . FLUoxetine (PROZAC) 40 MG capsule   Oral   Take 1 capsule (40 mg total) by mouth daily.   90 capsule   3   . HYDROcodone-acetaminophen (NORCO/VICODIN) 5-325 MG per tablet   Oral   Take 2 tablets by mouth every 6 (six) hours as needed. For pain         . ibuprofen (ADVIL,MOTRIN) 200 MG tablet   Oral   Take 600 mg by mouth every 6 (six) hours as needed for pain.         Marland Kitchen lansoprazole (PREVACID) 30 MG capsule   Oral   Take 30 mg by mouth daily.          . promethazine (PHENERGAN) 25 MG tablet   Oral   Take 25 mg by mouth every 6 (six) hours as needed for nausea.         Marland Kitchen  valsartan-hydrochlorothiazide (DIOVAN-HCT) 160-12.5 MG per tablet   Oral   Take 1 tablet by mouth daily.           BP 149/82  Pulse 85  Temp(Src) 98 F (36.7 C) (Oral)  Resp 24  Ht 5\' 7"  (1.702 m)  Wt 195 lb (88.451 kg)  BMI 30.53 kg/m2  SpO2 100%  Physical Exam  Nursing note and vitals reviewed. Constitutional: She appears well-developed and well-nourished. No distress.  HENT:  Head: Normocephalic and atraumatic.  Eyes: Conjunctivae are normal. Pupils are equal, round, and reactive to light.  Neck: Neck supple. No tracheal deviation present. No thyromegaly present.  Cardiovascular: Normal rate and regular rhythm.   No murmur heard. Pulmonary/Chest: Effort normal and breath sounds normal.  Abdominal: Soft. Bowel sounds are normal. She exhibits no distension and no mass. There is tenderness. There is no rebound and no guarding.  Obese Minimal diffuse tenderness   Musculoskeletal: Normal range of motion. She exhibits no edema and no tenderness.  Midline surgical scar over lumbar area pain at low back upon sitting up from supine position.  Neurological: She is alert. Coordination normal.  Skin: Skin is warm and dry. No rash noted.  Psychiatric: She has a normal mood and affect.    ED Course  Procedures (including critical care time)  Labs Reviewed  URINALYSIS, ROUTINE W REFLEX MICROSCOPIC  POCT I-STAT, CHEM 8   No results found.   No diagnosis found. 12:50 a.m. nausea diarrhea abdominal pain much improved. Patient still complains of back pain which is chronic. Request one more dose of pain medicine prior to discharge for her back pain X-rays viewed by me Results for orders placed during the hospital encounter of 05/06/12  URINALYSIS, ROUTINE W REFLEX MICROSCOPIC      Result Value Range   Color, Urine YELLOW  YELLOW   APPearance CLEAR  CLEAR   Specific Gravity, Urine 1.009  1.005 - 1.030   pH 6.0  5.0 - 8.0   Glucose, UA NEGATIVE  NEGATIVE mg/dL   Hgb urine dipstick NEGATIVE  NEGATIVE   Bilirubin Urine NEGATIVE  NEGATIVE   Ketones, ur NEGATIVE  NEGATIVE mg/dL   Protein, ur NEGATIVE  NEGATIVE mg/dL   Urobilinogen, UA 0.2  0.0 - 1.0 mg/dL   Nitrite NEGATIVE  NEGATIVE   Leukocytes, UA NEGATIVE  NEGATIVE  POCT I-STAT, CHEM 8      Result Value Range   Sodium 143  135 - 145 mEq/L   Potassium 3.8  3.5 - 5.1 mEq/L   Chloride 109  96 - 112 mEq/L   BUN 12  6 - 23 mg/dL   Creatinine, Ser 9.60  0.50 - 1.10 mg/dL   Glucose, Bld 96  70 - 99 mg/dL   Calcium, Ion 4.54  0.98 - 1.23 mmol/L   TCO2 22  0 - 100 mmol/L   Hemoglobin 13.6  12.0 - 15.0 g/dL   HCT 11.9  14.7 - 82.9 %   Dg Abd Acute W/chest  05/06/2012  *RADIOLOGY REPORT*  Clinical Data: 2-day history of nausea and vomiting, diarrhea, and abdominal pain.  Smoker.  ACUTE ABDOMEN SERIES (ABDOMEN 2 VIEW & CHEST 1 VIEW)  Comparison: Two-view chest x-ray 11/06/2011.  Acute abdomen series  11/16/2010.  Findings: Bowel gas pattern unremarkable without evidence of obstruction or significant ileus.  No evidence of free air or significant air fluid levels on the lateral decubitus image. Numerous pelvic phleboliths.  No visible opaque urinary tract calculi.  Prior L3-S1 fusion.  Cardiac silhouette upper normal in size to slightly enlarged but stable.  Hyperinflation, unchanged.  Lungs clear.  Bronchovascular markings normal.  Pulmonary vascularity normal.  No pneumothorax. No pleural effusions.  IMPRESSION:  1.  No acute abdominal abnormality. 2.  Stable borderline heart size and hyperinflation consistent with COPD and/or asthma.  No acute cardiopulmonary disease.   Original Report Authenticated By: Hulan Saas, M.D.     MDM  Back pain is chronic and felt to be musculoskeletal in etiology. Plan prescription Percocet, Zofran Diagnosis #1 gastroenteritis #2 chronic back pain       Doug Sou, MD 05/07/12 2130

## 2012-05-06 NOTE — ED Notes (Signed)
Patient sitting on stretcher at this time, no acute distress noted. Patient is complaining of pain that has increased and requesting more pain medication. Also wanting to know if discharge instructions are ready. Informed patient of plan of care and notified MD of patient requesting medication. Call light within in reach. Will continue to monitor.

## 2012-05-06 NOTE — ED Notes (Signed)
Patient currently resting quietly in bed; no respiratory or acute distress noted.  Patient updated on plan of care; informed patient that urine results are back and that EDP will be in to talk about results.  Patient given diet coke to drink; denies other needs at this time; will continue to monitor.  IV fluids still infusing at this time.

## 2012-05-06 NOTE — ED Notes (Signed)
Patient transported to X-ray 

## 2012-05-06 NOTE — ED Notes (Signed)
Per pt sts N,V,D and back pain 3 days. sts she feels dizzy and weak.

## 2012-05-06 NOTE — ED Notes (Signed)
Patient resting on stretcher at this time, no acute distress noted. resp are even and unlabored. Patient rates pain 4/10 at this time. Patient ambulated to restroom without difficulty. Fluids infusing. Call light within reach. Will continue to monitor.

## 2012-05-06 NOTE — ED Notes (Signed)
Received bedside report from [place], Charity fundraiser.  Patient currently resting quietly in bed; no respiratory or acute distress noted.  Patient requesting more pain medication at this time; EDP notified.  Patient denies any other needs; will continue to monitor.

## 2012-05-07 MED ORDER — ONDANSETRON HCL 4 MG PO TABS: 8.0000 mg | ORAL_TABLET | Freq: Three times a day (TID) | ORAL | Status: DC | PRN

## 2012-05-07 MED ORDER — HYDROMORPHONE HCL PF 1 MG/ML IJ SOLN
1.0000 mg | Freq: Once | INTRAMUSCULAR | Status: AC
  Administered 2012-05-07: 1 mg via INTRAVENOUS
  Filled 2012-05-07: qty 1

## 2012-05-07 MED ORDER — OXYCODONE-ACETAMINOPHEN 5-325 MG PO TABS: 1.0000 | ORAL_TABLET | Freq: Four times a day (QID) | ORAL | Status: DC | PRN

## 2012-05-07 NOTE — ED Notes (Signed)
Patient given discharge paperwork; went over discharge instructions with patient.  Instructed patient to take pain medication and anti-nausea medication as directed; instructed patient to not drive/drink while taking narcotics.  Patient instructed to follow up with referrals and to return to the ED for new, worsening, or concerning symptoms.

## 2012-05-07 NOTE — ED Notes (Signed)
Patient laying on stretcher, call light in head. Patient complaining of pain at this time, still requesting pain medication. resp are even and unlabored. No acute distress noted. Will continue to monitor. Patient requesting diet coke, provided

## 2012-05-31 DIAGNOSIS — M719 Bursopathy, unspecified: Secondary | ICD-10-CM | POA: Diagnosis not present

## 2012-05-31 DIAGNOSIS — M67919 Unspecified disorder of synovium and tendon, unspecified shoulder: Secondary | ICD-10-CM | POA: Diagnosis not present

## 2012-06-07 ENCOUNTER — Emergency Department (HOSPITAL_COMMUNITY)
Admission: EM | Admit: 2012-06-07 | Discharge: 2012-06-07 | Disposition: A | Discharge status: Home / Self Care | Payer: Medicare Other | Attending: Emergency Medicine | Admitting: Emergency Medicine

## 2012-06-07 ENCOUNTER — Encounter (HOSPITAL_COMMUNITY): Payer: Self-pay | Admitting: Emergency Medicine

## 2012-06-07 DIAGNOSIS — F17200 Nicotine dependence, unspecified, uncomplicated: Secondary | ICD-10-CM | POA: Insufficient documentation

## 2012-06-07 DIAGNOSIS — M545 Low back pain, unspecified: Secondary | ICD-10-CM | POA: Insufficient documentation

## 2012-06-07 DIAGNOSIS — R110 Nausea: Secondary | ICD-10-CM | POA: Diagnosis not present

## 2012-06-07 DIAGNOSIS — G8929 Other chronic pain: Secondary | ICD-10-CM | POA: Insufficient documentation

## 2012-06-07 DIAGNOSIS — M129 Arthropathy, unspecified: Secondary | ICD-10-CM | POA: Diagnosis not present

## 2012-06-07 DIAGNOSIS — Z79899 Other long term (current) drug therapy: Secondary | ICD-10-CM | POA: Insufficient documentation

## 2012-06-07 DIAGNOSIS — M549 Dorsalgia, unspecified: Secondary | ICD-10-CM

## 2012-06-07 DIAGNOSIS — K219 Gastro-esophageal reflux disease without esophagitis: Secondary | ICD-10-CM | POA: Diagnosis not present

## 2012-06-07 DIAGNOSIS — F329 Major depressive disorder, single episode, unspecified: Secondary | ICD-10-CM | POA: Insufficient documentation

## 2012-06-07 DIAGNOSIS — I10 Essential (primary) hypertension: Secondary | ICD-10-CM | POA: Insufficient documentation

## 2012-06-07 DIAGNOSIS — Z981 Arthrodesis status: Secondary | ICD-10-CM | POA: Insufficient documentation

## 2012-06-07 DIAGNOSIS — F411 Generalized anxiety disorder: Secondary | ICD-10-CM | POA: Insufficient documentation

## 2012-06-07 DIAGNOSIS — F3289 Other specified depressive episodes: Secondary | ICD-10-CM | POA: Insufficient documentation

## 2012-06-07 MED ORDER — DIAZEPAM 5 MG PO TABS
5.0000 mg | ORAL_TABLET | Freq: Once | ORAL | Status: AC
  Administered 2012-06-07: 5 mg via ORAL
  Filled 2012-06-07: qty 1

## 2012-06-07 MED ORDER — VALSARTAN 160 MG PO TABS: 160.0000 mg | ORAL_TABLET | Freq: Every day | ORAL | Status: DC

## 2012-06-07 MED ORDER — DIAZEPAM 5 MG PO TABS: 5.0000 mg | ORAL_TABLET | Freq: Two times a day (BID) | ORAL | Status: DC

## 2012-06-07 MED ORDER — HYDROCODONE-ACETAMINOPHEN 5-325 MG PO TABS: 2.0000 | ORAL_TABLET | ORAL | Status: DC | PRN

## 2012-06-07 MED ORDER — OXYCODONE-ACETAMINOPHEN 5-325 MG PO TABS
1.0000 | ORAL_TABLET | Freq: Once | ORAL | Status: AC
  Administered 2012-06-07: 1 via ORAL
  Filled 2012-06-07: qty 1

## 2012-06-07 NOTE — ED Provider Notes (Signed)
Medical screening examination/treatment/procedure(s) were performed by non-physician practitioner and as supervising physician I was immediately available for consultation/collaboration.  Raeford Razor, MD 06/07/12 [phone]

## 2012-06-07 NOTE — ED Notes (Signed)
Pt has chronic sciatica and sees MD for it; reports it is aggravated now and she took her last percocet yesterday. Pt believes a valium may help at this time.

## 2012-06-07 NOTE — ED Provider Notes (Signed)
History     CSN: [id number]  Arrival date & time 06/07/12  1131   First MD Initiated Contact with Patient 06/07/12 1140      Chief Complaint  Patient presents with  . Back Pain    (Consider location/radiation/quality/duration/timing/severity/associated sxs/prior treatment) HPI Comments: This is a 52 year old female who presents today with an acute exacerbation of chronic back pain. She states the onset was suddenly last night. The pain is throughout her low back with radiation to both her legs. This happens to her frequently she has had 4 discectomies. She has an appointment to see her neurosurgeon on 4/28. She states sometimes when this happens she will take a Valium and some Percocet which helps her. She is out of these medications. She also states she is out of her blood pressure medications and wonders if she could get a one-month supply of her medication. This pain feels like her chronic pain when she has an exacerbation. No bowel or bladder incontinence, IV drug abuse, cancer. No recent illness, fevers, chills, nausea, vomiting, abdominal pain, weakness. The history is provided by the patient. No language interpreter was used.    Past Medical History  Diagnosis Date  . Anxiety   . Hypertension   . Chronic low back pain 1991    disk s/p 4 diskectomies, 5th fursion, then spinal cord stimulator ([facility])  . Bronchitis     2 weeks ago rx regime  . Depression   . GERD (gastroesophageal reflux disease)   . Arthritis     Past Surgical History  Procedure Laterality Date  . Spine surgery  7/ 12 last    5 previous  . Stimulator  12    spine,removed 7/12  . Cesarean section  84  . Lumbar fusion  04/27/2011    Family History  Problem Relation Age of Onset  . Stroke Father   . Hypertension Father     History  Substance Use Topics  . Smoking status: Current Every Day Smoker -- 1.00 packs/day for 30 years    Types: Cigarettes  . Smokeless tobacco: Never Used     Comment:  prior trial of zyban made her feel weird   . Alcohol Use: No    OB History   Grav Para Term Preterm Abortions TAB SAB Ect Mult Living                  Review of Systems  Respiratory: Negative for shortness of breath.   Cardiovascular: Negative for chest pain.  Gastrointestinal: Positive for nausea (she believes this is due to the pain). Negative for vomiting and abdominal pain.  Musculoskeletal: Positive for back pain. Negative for gait problem.  All other systems reviewed and are negative.    Allergies  Review of patient's allergies indicates no known allergies.  Home Medications   Current Outpatient Rx  Name  Route  Sig  Dispense  Refill  . FLUoxetine (PROZAC) 40 MG capsule   Oral   Take 1 capsule (40 mg total) by mouth daily.   90 capsule   3   . ibuprofen (ADVIL,MOTRIN) 200 MG tablet   Oral   Take 400 mg by mouth every 6 (six) hours as needed for pain.          Marland Kitchen lansoprazole (PREVACID) 30 MG capsule   Oral   Take 30 mg by mouth daily.          . promethazine (PHENERGAN) 25 MG tablet   Oral   Take 25  mg by mouth every 6 (six) hours as needed for nausea.         . valsartan-hydrochlorothiazide (DIOVAN-HCT) 160-12.5 MG per tablet   Oral   Take 1 tablet by mouth daily.           BP 165/85  Pulse 85  Temp(Src) 98 F (36.7 C) (Oral)  Resp 18  SpO2 97%  Physical Exam  Nursing note and vitals reviewed. Constitutional: She is oriented to person, place, and time. She appears well-developed and well-nourished. No distress.  HENT:  Head: Normocephalic and atraumatic.  Right Ear: External ear normal.  Left Ear: External ear normal.  Nose: Nose normal.  Mouth/Throat: Oropharynx is clear and moist.  Eyes: Conjunctivae are normal.  Neck: Normal range of motion.  Cardiovascular: Normal rate, regular rhythm and normal heart sounds.   Pulmonary/Chest: Effort normal and breath sounds normal. No stridor. No respiratory distress. She has no wheezes. She has  no rales.  Abdominal: Soft. She exhibits no distension. There is no tenderness.  Musculoskeletal: Normal range of motion. She exhibits tenderness.  Tender to palpation over lower back - no step off, deformity  Neurological: She is alert and oriented to person, place, and time. She has normal strength.  Skin: Skin is warm and dry. She is not diaphoretic. No erythema.  Psychiatric: She has a normal mood and affect. Her behavior is normal.    ED Course  Procedures (including critical care time)  Labs Reviewed - No data to display No results found.   1. Back pain       MDM  Patient presents with an exacerbation of her chronic low back pain. She has an appointment to see her neurosurgeon on 4/28. No red flags including bowel or bladder incontinence, IV drug use, history of cancer. Pain managed in ED. Rx for Valium and Norco given for pain control until she can see her neurosurgeon. Patient is ambulatory at time of discharge. Return instructions given. Vital signs stable for discharge.Patient / Family / Caregiver informed of clinical course, understand medical decision-making process, and agree with plan.         Mora Bellman, PA-C 06/07/12 1520

## 2012-06-07 NOTE — ED Notes (Signed)
Pt c/o lower back pain with radiation down legs; pt sts hx of same with back sx; pt sts appointment on 4/28 but having a lot of pain

## 2012-06-10 ENCOUNTER — Emergency Department (HOSPITAL_COMMUNITY)
Admission: EM | Admit: 2012-06-10 | Discharge: 2012-06-10 | Disposition: A | Discharge status: Home / Self Care | Payer: Medicare Other | Attending: Emergency Medicine | Admitting: Emergency Medicine

## 2012-06-10 ENCOUNTER — Encounter (HOSPITAL_COMMUNITY): Payer: Self-pay | Admitting: Emergency Medicine

## 2012-06-10 DIAGNOSIS — Z9889 Other specified postprocedural states: Secondary | ICD-10-CM | POA: Insufficient documentation

## 2012-06-10 DIAGNOSIS — F329 Major depressive disorder, single episode, unspecified: Secondary | ICD-10-CM | POA: Insufficient documentation

## 2012-06-10 DIAGNOSIS — M545 Low back pain, unspecified: Secondary | ICD-10-CM | POA: Insufficient documentation

## 2012-06-10 DIAGNOSIS — Z981 Arthrodesis status: Secondary | ICD-10-CM | POA: Diagnosis not present

## 2012-06-10 DIAGNOSIS — Z8709 Personal history of other diseases of the respiratory system: Secondary | ICD-10-CM | POA: Diagnosis not present

## 2012-06-10 DIAGNOSIS — K219 Gastro-esophageal reflux disease without esophagitis: Secondary | ICD-10-CM | POA: Diagnosis not present

## 2012-06-10 DIAGNOSIS — M129 Arthropathy, unspecified: Secondary | ICD-10-CM | POA: Insufficient documentation

## 2012-06-10 DIAGNOSIS — F17200 Nicotine dependence, unspecified, uncomplicated: Secondary | ICD-10-CM | POA: Insufficient documentation

## 2012-06-10 DIAGNOSIS — F411 Generalized anxiety disorder: Secondary | ICD-10-CM | POA: Diagnosis not present

## 2012-06-10 DIAGNOSIS — Z79899 Other long term (current) drug therapy: Secondary | ICD-10-CM | POA: Diagnosis not present

## 2012-06-10 DIAGNOSIS — M79609 Pain in unspecified limb: Secondary | ICD-10-CM | POA: Insufficient documentation

## 2012-06-10 DIAGNOSIS — M549 Dorsalgia, unspecified: Secondary | ICD-10-CM

## 2012-06-10 DIAGNOSIS — I10 Essential (primary) hypertension: Secondary | ICD-10-CM | POA: Insufficient documentation

## 2012-06-10 DIAGNOSIS — G8929 Other chronic pain: Secondary | ICD-10-CM | POA: Diagnosis not present

## 2012-06-10 DIAGNOSIS — F3289 Other specified depressive episodes: Secondary | ICD-10-CM | POA: Insufficient documentation

## 2012-06-10 MED ORDER — CARISOPRODOL 350 MG PO TABS: 350.0000 mg | ORAL_TABLET | Freq: Three times a day (TID) | ORAL | Status: DC

## 2012-06-10 MED ORDER — OXYCODONE-ACETAMINOPHEN 5-325 MG PO TABS
2.0000 | ORAL_TABLET | Freq: Once | ORAL | Status: AC
  Administered 2012-06-10: 2 via ORAL
  Filled 2012-06-10: qty 2

## 2012-06-10 NOTE — ED Provider Notes (Signed)
History     CSN: [id number]  Arrival date & time 06/10/12  1149   First MD Initiated Contact with Patient 06/10/12 1409      Chief Complaint  Patient presents with  . Leg Pain  . Back Pain    (Consider location/radiation/quality/duration/timing/severity/associated sxs/prior treatment) HPI Comments: 52 y.o. Female with PMHx of 4 discectomies presents with flare up of chronic back pain. Pt was seen at ED three days ago for same, prescribed Norco and Valium.  The pain is the same as discussed on Thursday: throughout her low back with radiation to both her legs.  She has an appointment to see her neurosurgeon on 06/18/12. No bowel or bladder incontinence, IV drug abuse, cancer. No recent illness, fevers, chills, nausea, vomiting, abdominal pain, weakness.   Patient is a 52 y.o. female presenting with leg pain and back pain.  Leg Pain Associated symptoms: back pain   Associated symptoms: no fever and no neck pain   Back Pain Associated symptoms: leg pain   Associated symptoms: no chest pain, no dysuria, no fever, no headaches, no numbness, no pelvic pain and no weakness     Past Medical History  Diagnosis Date  . Anxiety   . Hypertension   . Chronic low back pain 1991    disk s/p 4 diskectomies, 5th fursion, then spinal cord stimulator (Cabbell)  . Bronchitis     2 weeks ago rx regime  . Depression   . GERD (gastroesophageal reflux disease)   . Arthritis     Past Surgical History  Procedure Laterality Date  . Spine surgery  7/ 12 last    5 previous  . Stimulator  12    spine,removed 7/12  . Cesarean section  86  . Lumbar fusion  04/27/2011    Family History  Problem Relation Age of Onset  . Stroke Father   . Hypertension Father     History  Substance Use Topics  . Smoking status: Current Every Day Smoker -- 1.00 packs/day for 30 years    Types: Cigarettes  . Smokeless tobacco: Never Used     Comment: prior trial of zyban made her feel weird   . Alcohol Use: No     OB History   Grav Para Term Preterm Abortions TAB SAB Ect Mult Living                  Review of Systems  Constitutional: Negative for fever and diaphoresis.  HENT: Negative for neck pain and neck stiffness.   Eyes: Negative for visual disturbance.  Respiratory: Negative for apnea, chest tightness and shortness of breath.   Cardiovascular: Negative for chest pain and palpitations.  Gastrointestinal: Negative for nausea, vomiting, diarrhea and constipation.  Genitourinary: Negative for dysuria and pelvic pain.  Musculoskeletal: Positive for back pain. Negative for gait problem.  Skin: Negative for rash.  Neurological: Negative for dizziness, weakness, light-headedness, numbness and headaches.    Allergies  Review of patient's allergies indicates no known allergies.  Home Medications   Current Outpatient Rx  Name  Route  Sig  Dispense  Refill  . carisoprodol (SOMA) 350 MG tablet   Oral   Take 1 tablet (350 mg total) by mouth 3 (three) times daily.   15 tablet   0   . diazepam (VALIUM) 5 MG tablet   Oral   Take 1 tablet (5 mg total) by mouth 2 (two) times daily.   10 tablet   0   . FLUoxetine (PROZAC)  40 MG capsule   Oral   Take 1 capsule (40 mg total) by mouth daily.   90 capsule   3   . HYDROcodone-acetaminophen (NORCO/VICODIN) 5-325 MG per tablet   Oral   Take 2 tablets by mouth every 4 (four) hours as needed for pain.   6 tablet   0   . ibuprofen (ADVIL,MOTRIN) 200 MG tablet   Oral   Take 400 mg by mouth every 6 (six) hours as needed for pain.          Marland Kitchen lansoprazole (PREVACID) 30 MG capsule   Oral   Take 30 mg by mouth daily.          . promethazine (PHENERGAN) 25 MG tablet   Oral   Take 25 mg by mouth every 6 (six) hours as needed for nausea.         . valsartan (DIOVAN) 160 MG tablet   Oral   Take 1 tablet (160 mg total) by mouth daily.   30 tablet   0   . valsartan-hydrochlorothiazide (DIOVAN-HCT) 160-12.5 MG per tablet   Oral    Take 1 tablet by mouth daily.           BP 132/88  Pulse 82  Temp(Src) 98.2 F (36.8 C) (Oral)  Resp 18  SpO2 98%  Physical Exam  Nursing note and vitals reviewed. Constitutional: She is oriented to person, place, and time. She appears well-developed and well-nourished. No distress.  HENT:  Head: Normocephalic and atraumatic.  Eyes: Conjunctivae and EOM are normal.  Neck: Normal range of motion. Neck supple.  No meningeal signs  Cardiovascular: Normal rate, regular rhythm and normal heart sounds.  Exam reveals no gallop and no friction rub.   No murmur heard. Pulmonary/Chest: Effort normal and breath sounds normal. No respiratory distress. She has no wheezes. She has no rales. She exhibits no tenderness.  Abdominal: Soft. Bowel sounds are normal. She exhibits no distension. There is no tenderness. There is no rebound and no guarding.  Musculoskeletal: Normal range of motion. She exhibits no edema and no tenderness.  No step-offs noted on C-spine, full range of motion of the T-spine and L-spine, no tenderness to palpation of the spinous processes of the C-spine, T-spine or L-spine. Mild tenderness to palpation of the lumbar paraspinous muscles. Normal strength in upper and lower extremities bilaterally including dorsiflexion and plantar flexion, strong and equal grip strength, Moves extremities without ataxia, coordination intact   Neurological: She is alert and oriented to person, place, and time. No cranial nerve deficit.  Speech is clear and goal oriented, follows commands Sensation normal to light and sharp touch Normal gait and balance  Skin: Skin is warm and dry. She is not diaphoretic. No erythema.  Psychiatric: She has a normal mood and affect.    ED Course  Procedures (including critical care time) Medications  oxyCODONE-acetaminophen (PERCOCET/ROXICET) 5-325 MG per tablet 2 tablet (2 tablets Oral Given 06/10/12 1430)   Discharge Medication List as of 06/10/2012  2:24  PM    START taking these medications   Details  carisoprodol (SOMA) 350 MG tablet Take 1 tablet (350 mg total) by mouth 3 (three) times daily., Starting 06/10/2012, Until Discontinued, Print        Labs Reviewed - No data to display No results found.   1. Back pain       MDM  52 y.o. Female with PMHx of 4 discectomies presents with flare up of chronic back pain. Pt was  seen at ED three days ago for same, prescribed Norco and Valium.  Physical exam revealed no neurological deficits and normal neuro exam.  Patient can walk but states is painful.  No loss of bowel or bladder control.  No concern for cauda equina.  No fever, night sweats, weight loss, h/o cancer, IVDU.  Discussed the importance of follow up for chronic conditions and the ED has a strict protocol on prescribing narcotic pain meds for chronic pain. Will treat with percocet in the ED and prescribe five days of soma so that she can make arrangements to treat her pain.  Discussed other options to treat her pain and will provide resource guide and ED chronic pain discharge instructions. Pt understands and is agreeable to discharge.   Glade Nurse, PA-C 06/10/12 1611

## 2012-06-10 NOTE — ED Notes (Signed)
Pt c/o lower back pain into hip that is chronic in nature; pt sts here on Thursday for same

## 2012-06-11 NOTE — ED Provider Notes (Signed)
Medical screening examination/treatment/procedure(s) were performed by non-physician practitioner and as supervising physician I was immediately available for consultation/collaboration.  Martha K Linker, MD 06/11/12 0912 

## 2012-06-18 DIAGNOSIS — M48061 Spinal stenosis, lumbar region without neurogenic claudication: Secondary | ICD-10-CM | POA: Diagnosis not present

## 2012-06-19 ENCOUNTER — Other Ambulatory Visit: Payer: Self-pay | Admitting: Neurosurgery

## 2012-06-19 DIAGNOSIS — M545 Low back pain: Secondary | ICD-10-CM

## 2012-06-21 ENCOUNTER — Ambulatory Visit
Admission: RE | Admit: 2012-06-21 | Discharge: 2012-06-21 | Disposition: A | Discharge status: Home / Self Care | Payer: Medicare Other | Source: Ambulatory Visit | Attending: Neurosurgery | Admitting: Neurosurgery

## 2012-06-21 DIAGNOSIS — M545 Low back pain: Secondary | ICD-10-CM

## 2012-06-21 DIAGNOSIS — M719 Bursopathy, unspecified: Secondary | ICD-10-CM | POA: Diagnosis not present

## 2012-06-21 DIAGNOSIS — M67919 Unspecified disorder of synovium and tendon, unspecified shoulder: Secondary | ICD-10-CM | POA: Diagnosis not present

## 2012-06-26 ENCOUNTER — Ambulatory Visit
Admission: RE | Admit: 2012-06-26 | Discharge: 2012-06-26 | Disposition: A | Discharge status: Home / Self Care | Payer: Medicare Other | Source: Ambulatory Visit | Attending: Neurosurgery | Admitting: Neurosurgery

## 2012-06-26 DIAGNOSIS — M48061 Spinal stenosis, lumbar region without neurogenic claudication: Secondary | ICD-10-CM | POA: Diagnosis not present

## 2012-06-26 MED ORDER — GADOBENATE DIMEGLUMINE 529 MG/ML IV SOLN
19.0000 mL | Freq: Once | INTRAVENOUS | Status: AC | PRN
  Administered 2012-06-26: 19 mL via INTRAVENOUS

## 2012-06-28 ENCOUNTER — Emergency Department (HOSPITAL_COMMUNITY)
Admission: EM | Admit: 2012-06-28 | Discharge: 2012-06-28 | Disposition: A | Discharge status: Home / Self Care | Payer: Medicare Other | Attending: Emergency Medicine | Admitting: Emergency Medicine

## 2012-06-28 ENCOUNTER — Encounter (HOSPITAL_COMMUNITY): Payer: Self-pay | Admitting: Emergency Medicine

## 2012-06-28 DIAGNOSIS — F17200 Nicotine dependence, unspecified, uncomplicated: Secondary | ICD-10-CM | POA: Diagnosis not present

## 2012-06-28 DIAGNOSIS — M79605 Pain in left leg: Secondary | ICD-10-CM

## 2012-06-28 DIAGNOSIS — K219 Gastro-esophageal reflux disease without esophagitis: Secondary | ICD-10-CM | POA: Insufficient documentation

## 2012-06-28 DIAGNOSIS — I10 Essential (primary) hypertension: Secondary | ICD-10-CM | POA: Diagnosis not present

## 2012-06-28 DIAGNOSIS — F329 Major depressive disorder, single episode, unspecified: Secondary | ICD-10-CM | POA: Diagnosis not present

## 2012-06-28 DIAGNOSIS — Z8709 Personal history of other diseases of the respiratory system: Secondary | ICD-10-CM | POA: Insufficient documentation

## 2012-06-28 DIAGNOSIS — F3289 Other specified depressive episodes: Secondary | ICD-10-CM | POA: Insufficient documentation

## 2012-06-28 DIAGNOSIS — M545 Low back pain, unspecified: Secondary | ICD-10-CM

## 2012-06-28 DIAGNOSIS — F411 Generalized anxiety disorder: Secondary | ICD-10-CM | POA: Diagnosis not present

## 2012-06-28 DIAGNOSIS — R209 Unspecified disturbances of skin sensation: Secondary | ICD-10-CM | POA: Insufficient documentation

## 2012-06-28 DIAGNOSIS — Z79899 Other long term (current) drug therapy: Secondary | ICD-10-CM | POA: Insufficient documentation

## 2012-06-28 DIAGNOSIS — G8929 Other chronic pain: Secondary | ICD-10-CM | POA: Diagnosis not present

## 2012-06-28 DIAGNOSIS — Z8739 Personal history of other diseases of the musculoskeletal system and connective tissue: Secondary | ICD-10-CM | POA: Insufficient documentation

## 2012-06-28 MED ORDER — KETOROLAC TROMETHAMINE 60 MG/2ML IM SOLN
60.0000 mg | Freq: Once | INTRAMUSCULAR | Status: AC
  Administered 2012-06-28: 60 mg via INTRAMUSCULAR
  Filled 2012-06-28: qty 2

## 2012-06-28 MED ORDER — HYDROMORPHONE HCL PF 1 MG/ML IJ SOLN
1.0000 mg | Freq: Once | INTRAMUSCULAR | Status: AC
  Administered 2012-06-28: 1 mg via INTRAMUSCULAR
  Filled 2012-06-28: qty 1

## 2012-06-28 MED ORDER — IBUPROFEN 800 MG PO TABS: 800.0000 mg | ORAL_TABLET | Freq: Three times a day (TID) | ORAL | Status: DC

## 2012-06-28 MED ORDER — DIAZEPAM 5 MG PO TABS: 5.0000 mg | ORAL_TABLET | Freq: Two times a day (BID) | ORAL | Status: DC

## 2012-06-28 MED ORDER — HYDROCODONE-ACETAMINOPHEN 5-325 MG PO TABS: ORAL_TABLET | ORAL | Status: DC

## 2012-06-28 NOTE — ED Notes (Signed)
Pt c/o lower back pain that is chronic in nature; pt sts seeing neurosurgery and pain management on Tuesday and needs some meds until then

## 2012-06-28 NOTE — ED Notes (Signed)
Pt reports chronic lower back pain. Reports having multiple lumbar fusions for the pain. States that father was ill and had multiple falls. States that she had to take care of him and has re-injured her back. States that she is out of her pain medicine and also her prozac. States that she has an appt with Dr. Franky Macho, Neurosurgeon, on Tuesday as well as initial appt with a pain mgmt clinic on Tuesday. Requesting to have a pain shot and toradol shot, and a valium. Pt ambulated to room without difficulty.

## 2012-06-28 NOTE — ED Provider Notes (Signed)
History    This chart was scribed for non-physician practitioner Madeline Dickerson working with Madeline Munch, MD by Madeline Dickerson, ED Scribe. This patient was seen in room TR08C/TR08C and the patient's care was started at 9:24 PM .   CSN: [id number]  Arrival date & time 06/28/12  1853      Chief Complaint  Patient presents with  . Back Pain     The history is provided by the patient. No language interpreter was used.    HPI Comments: Madeline Dickerson is a 52 y.o. female with h/o chronic lower back pain who presents to the Emergency Department complaining of severe, constant lower back pain.  Pt states she has an appointment with pain management on 06/03/12 to have her medication refilled, and requests some medication to tide her over until that time.  Pt also reports mild numbness and tingling in right leg.   She denies neck pain, abdominal pain, nausea, emesis, fever, chills, diarrhea, urinary symptoms, bowel symptoms, or any other associated symptoms.  She reports that her pain is usually relieved temporarily by dilaudid with Toradol.  Pt has h/o several spinal surgeries, which she states temporarily relieves her symptoms. She denies allergies to medications.  Pt states she is not driving.   Past Medical History  Diagnosis Date  . Anxiety   . Hypertension   . Chronic low back pain 1991    disk s/p 4 diskectomies, 5th fursion, then spinal cord stimulator (Cabbell)  . Bronchitis     2 weeks ago rx regime  . Depression   . GERD (gastroesophageal reflux disease)   . Arthritis     Past Surgical History  Procedure Laterality Date  . Spine surgery  7/ 12 last    5 previous  . Stimulator  12    spine,removed 7/12  . Cesarean section  84  . Lumbar fusion  04/27/2011    Family History  Problem Relation Age of Onset  . Stroke Father   . Hypertension Father     History  Substance Use Topics  . Smoking status: Current Every Day Smoker -- 1.00 packs/day for 30 years    Types:  Cigarettes  . Smokeless tobacco: Never Used     Comment: prior trial of zyban made her feel weird   . Alcohol Use: No    OB History   Grav Para Term Preterm Abortions TAB SAB Ect Mult Living                  Review of Systems  Constitutional: Negative for fever and chills.  HENT: Negative for neck pain.   Gastrointestinal: Negative for nausea, vomiting, abdominal pain, diarrhea and constipation.  Genitourinary: Negative for dysuria and difficulty urinating.  Musculoskeletal: Positive for back pain.  Neurological: Positive for numbness (right leg).  All other systems reviewed and are negative.    Allergies  Review of patient's allergies indicates no known allergies.  Home Medications   Current Outpatient Rx  Name  Route  Sig  Dispense  Refill  . Aspirin-Salicylamide-Caffeine (BC HEADACHE POWDER PO)   Oral   Take 1 packet by mouth daily as needed (for pain).         Marland Kitchen FLUoxetine (PROZAC) 40 MG capsule   Oral   Take 40 mg by mouth daily.         Marland Kitchen HYDROcodone-acetaminophen (NORCO/VICODIN) 5-325 MG per tablet   Oral   Take 2 tablets by mouth every 4 (four) hours as needed for  pain.         . ibuprofen (ADVIL,MOTRIN) 200 MG tablet   Oral   Take 800 mg by mouth every 6 (six) hours as needed for pain.          Marland Kitchen lansoprazole (PREVACID) 30 MG capsule   Oral   Take 30 mg by mouth daily.          Marland Kitchen oxyCODONE-acetaminophen (PERCOCET/ROXICET) 5-325 MG per tablet   Oral   Take 1 tablet by mouth every 4 (four) hours as needed for pain.         . promethazine (PHENERGAN) 25 MG tablet   Oral   Take 25 mg by mouth every 6 (six) hours as needed for nausea.         . valsartan (DIOVAN) 160 MG tablet   Oral   Take 160 mg by mouth daily.         . diazepam (VALIUM) 5 MG tablet   Oral   Take 1 tablet (5 mg total) by mouth 2 (two) times daily.   10 tablet   0   . HYDROcodone-acetaminophen (NORCO/VICODIN) 5-325 MG per tablet      1-2 Tab every 4-6 hours  as needed for pain.   6 tablet   0   . ibuprofen (ADVIL,MOTRIN) 800 MG tablet   Oral   Take 1 tablet (800 mg total) by mouth 3 (three) times daily.   21 tablet   0     BP 136/94  Pulse 105  Temp(Src) 98.7 F (37.1 C) (Oral)  Resp 18  SpO2 98%  Physical Exam  Nursing note and vitals reviewed. Constitutional: She is oriented to person, place, and time. She appears well-developed and well-nourished. No distress.  Pt lying flat on exam bed  HENT:  Head: Normocephalic and atraumatic.  Eyes: EOM are normal.  Neck: Normal range of motion. Neck supple. No tracheal deviation present.  Neck-nontender, FROM  Cardiovascular: Normal rate, regular rhythm and normal heart sounds.   Pulmonary/Chest: Effort normal and breath sounds normal. No respiratory distress.  Musculoskeletal: Normal range of motion. She exhibits tenderness ( lower lumbar region, R>L). She exhibits no edema.  Neurological: She is alert and oriented to person, place, and time. She has normal strength. She displays no atrophy. No sensory deficit. She exhibits normal muscle tone. Gait ( antalgic gait) abnormal.  Skin: Skin is warm and dry. No rash noted. No erythema.  Psychiatric: Her behavior is normal. Her mood appears anxious.    ED Course  Procedures (including critical care time)  DIAGNOSTIC STUDIES: Oxygen Saturation is 98% on room air, normal by my interpretation.    COORDINATION OF CARE: 3:22 PM-Discussed treatment plan which includes pain medication with pt at bedside and pt agreed to plan.      Labs Reviewed - No data to display No results found.   1. Low back pain radiating to left leg       MDM  Pt presented with exacerbation of chronic LBP.  Pain is more severe today but nothing new.  Denies fever or change in bowel or bladder habits.  Pt had recent MRI and has f/u with neurosurgery and pain management next week.  No further workup is needed at this time.   Tx in ED: diluadid and toradol.  Pt  was very pleasant, honest, and respectful. Tx did help relieve some pain.  Pt has friend drive her home.  Rx: valium, norco and ibuprofen.  Will have pt f/u with  neurosurgery Dr. Franky Dickerson  And pain management on Tuesday 07/03/12  I personally performed the services described in this documentation, which was scribed in my presence. The recorded information has been reviewed and is accurate.     Madeline Finner, PA-C 06/29/12 1526

## 2012-06-29 NOTE — ED Provider Notes (Signed)
  Medical screening examination/treatment/procedure(s) were performed by non-physician practitioner and as supervising physician I was immediately available for consultation/collaboration.   Baily Serpe, MD 06/29/12 1611 

## 2012-07-03 DIAGNOSIS — M961 Postlaminectomy syndrome, not elsewhere classified: Secondary | ICD-10-CM | POA: Diagnosis not present

## 2012-08-28 DIAGNOSIS — F339 Major depressive disorder, recurrent, unspecified: Secondary | ICD-10-CM | POA: Diagnosis not present

## 2012-08-28 DIAGNOSIS — F331 Major depressive disorder, recurrent, moderate: Secondary | ICD-10-CM | POA: Diagnosis not present

## 2012-08-30 ENCOUNTER — Encounter (HOSPITAL_COMMUNITY): Payer: Self-pay | Admitting: Emergency Medicine

## 2012-08-30 ENCOUNTER — Emergency Department (HOSPITAL_COMMUNITY)
Admission: EM | Admit: 2012-08-30 | Discharge: 2012-08-30 | Disposition: A | Discharge status: Home / Self Care | Payer: Medicare Other | Attending: Emergency Medicine | Admitting: Emergency Medicine

## 2012-08-30 DIAGNOSIS — M25519 Pain in unspecified shoulder: Secondary | ICD-10-CM | POA: Insufficient documentation

## 2012-08-30 DIAGNOSIS — K219 Gastro-esophageal reflux disease without esophagitis: Secondary | ICD-10-CM | POA: Insufficient documentation

## 2012-08-30 DIAGNOSIS — Z8709 Personal history of other diseases of the respiratory system: Secondary | ICD-10-CM | POA: Diagnosis not present

## 2012-08-30 DIAGNOSIS — F3289 Other specified depressive episodes: Secondary | ICD-10-CM | POA: Insufficient documentation

## 2012-08-30 DIAGNOSIS — F329 Major depressive disorder, single episode, unspecified: Secondary | ICD-10-CM | POA: Insufficient documentation

## 2012-08-30 DIAGNOSIS — Z79899 Other long term (current) drug therapy: Secondary | ICD-10-CM | POA: Diagnosis not present

## 2012-08-30 DIAGNOSIS — M25511 Pain in right shoulder: Secondary | ICD-10-CM

## 2012-08-30 DIAGNOSIS — I10 Essential (primary) hypertension: Secondary | ICD-10-CM | POA: Diagnosis not present

## 2012-08-30 DIAGNOSIS — Z8739 Personal history of other diseases of the musculoskeletal system and connective tissue: Secondary | ICD-10-CM | POA: Insufficient documentation

## 2012-08-30 DIAGNOSIS — G8929 Other chronic pain: Secondary | ICD-10-CM | POA: Diagnosis not present

## 2012-08-30 DIAGNOSIS — F411 Generalized anxiety disorder: Secondary | ICD-10-CM | POA: Insufficient documentation

## 2012-08-30 DIAGNOSIS — F17200 Nicotine dependence, unspecified, uncomplicated: Secondary | ICD-10-CM | POA: Diagnosis not present

## 2012-08-30 HISTORY — DX: Unspecified disorder of synovium and tendon, unspecified shoulder: M67.919

## 2012-08-30 MED ORDER — DIAZEPAM 5 MG PO TABS
5.0000 mg | ORAL_TABLET | Freq: Once | ORAL | Status: AC
  Administered 2012-08-30: 5 mg via ORAL
  Filled 2012-08-30: qty 1

## 2012-08-30 MED ORDER — KETOROLAC TROMETHAMINE 60 MG/2ML IM SOLN
60.0000 mg | Freq: Once | INTRAMUSCULAR | Status: AC
  Administered 2012-08-30: 60 mg via INTRAMUSCULAR
  Filled 2012-08-30: qty 2

## 2012-08-30 MED ORDER — OXYCODONE-ACETAMINOPHEN 5-325 MG PO TABS
1.0000 | ORAL_TABLET | Freq: Once | ORAL | Status: AC
  Administered 2012-08-30: 1 via ORAL
  Filled 2012-08-30: qty 1

## 2012-08-30 MED ORDER — CYCLOBENZAPRINE HCL 10 MG PO TABS: 10.0000 mg | ORAL_TABLET | Freq: Two times a day (BID) | ORAL | Status: DC | PRN

## 2012-08-30 MED ORDER — VALSARTAN 160 MG PO TABS: 160.0000 mg | ORAL_TABLET | Freq: Every day | ORAL | Status: DC

## 2012-08-30 MED ORDER — OXYCODONE-ACETAMINOPHEN 5-325 MG PO TABS: 1.0000 | ORAL_TABLET | Freq: Four times a day (QID) | ORAL | Status: DC | PRN

## 2012-08-30 MED ORDER — IRBESARTAN 300 MG PO TABS
300.0000 mg | ORAL_TABLET | Freq: Every day | ORAL | Status: DC
  Administered 2012-08-30: 300 mg via ORAL
  Filled 2012-08-30: qty 1

## 2012-08-30 NOTE — ED Provider Notes (Signed)
History    CSN: [id number] Arrival date & time 08/30/12  1122  First MD Initiated Contact with Patient 08/30/12 1146     Chief Complaint  Patient presents with  . Shoulder Pain   (Consider location/radiation/quality/duration/timing/severity/associated sxs/prior Treatment) HPI Comments: Patient is a 52 year old female with a past medical history of left rotator cuff injury who presents with a 10-12 day history of pain. Symptoms started gradually and progressively worsened since the onset. The pain is aching and severe with radiation down her arm. Movement makes the pain worse. Nothing makes the pain better. Patient has tried ibuprofen and heat without relief. No other associated symptoms. Patient denies any new injury. Patient has seen Dr. Lajoyce Corners in the past for her shoulder pain.   Past Medical History  Diagnosis Date  . Anxiety   . Hypertension   . Chronic low back pain 1991    disk s/p 4 diskectomies, 5th fursion, then spinal cord stimulator (Cabbell)  . Bronchitis     2 weeks ago rx regime  . Depression   . GERD (gastroesophageal reflux disease)   . Arthritis   . Rotator cuff disorder    Past Surgical History  Procedure Laterality Date  . Spine surgery  7/ 12 last    5 previous  . Stimulator  12    spine,removed 7/12  . Cesarean section  84  . Lumbar fusion  04/27/2011   Family History  Problem Relation Age of Onset  . Stroke Father   . Hypertension Father    History  Substance Use Topics  . Smoking status: Current Every Day Smoker -- 1.00 packs/day for 30 years    Types: Cigarettes  . Smokeless tobacco: Never Used     Comment: prior trial of zyban made her feel weird   . Alcohol Use: No   OB History   Grav Para Term Preterm Abortions TAB SAB Ect Mult Living                 Review of Systems  Musculoskeletal: Positive for arthralgias.  All other systems reviewed and are negative.    Allergies  Review of patient's allergies indicates no known  allergies.  Home Medications   Current Outpatient Rx  Name  Route  Sig  Dispense  Refill  . FLUoxetine (PROZAC) 20 MG tablet   Oral   Take 20 mg by mouth daily.         Marland Kitchen ibuprofen (ADVIL,MOTRIN) 200 MG tablet   Oral   Take 800 mg by mouth every 6 (six) hours as needed for pain.          Marland Kitchen lansoprazole (PREVACID) 30 MG capsule   Oral   Take 30 mg by mouth daily.          . promethazine (PHENERGAN) 25 MG tablet   Oral   Take 25 mg by mouth every 6 (six) hours as needed for nausea.         . valsartan (DIOVAN) 160 MG tablet   Oral   Take 160 mg by mouth daily.          BP 201/111  Pulse 83  Temp(Src) 98.7 F (37.1 C) (Oral)  Resp 18  SpO2 100%  LMP 08/28/2012 Physical Exam  Nursing note and vitals reviewed. Constitutional: She is oriented to person, place, and time. She appears well-developed and well-nourished. No distress.  HENT:  Head: Normocephalic and atraumatic.  Eyes: Conjunctivae and EOM are normal.  Neck: Normal  range of motion.  Cardiovascular: Normal rate and regular rhythm.  Exam reveals no gallop and no friction rub.   No murmur heard. Pulmonary/Chest: Effort normal and breath sounds normal. She has no wheezes. She has no rales. She exhibits no tenderness.  Abdominal: Soft. She exhibits no distension. There is no tenderness. There is no rebound and no guarding.  Musculoskeletal: Normal range of motion.  Left shoulder tenderness to palpation. No obvious deformity noted. ROM limited due to pain.   Neurological: She is alert and oriented to person, place, and time. Coordination normal.  Speech is goal-oriented. Moves limbs without ataxia.   Skin: Skin is warm and dry.  Psychiatric: She has a normal mood and affect. Her behavior is normal.    ED Course  Procedures (including critical care time) Labs Reviewed - No data to display No results found.  1. Right shoulder pain     MDM  12:36 PM Patient reports relief after toradol and valium.  No new injury. Patient will have pain medication and BP medication refill. Patient advised to follow up with Orthopedist and PCP. No further evaluation needed at this time.   Emilia Beck, PA-C 08/30/12 1257

## 2012-08-30 NOTE — ED Notes (Signed)
PT reports she has had left shoulder pain for 10-12 days; has appt with ortho on the 23rd with Lajoyce Corners but pain is too intense. Rotator cuff issues that cortisone shots helped in past but flaring up again now. Denies chest pain, SOB.

## 2012-08-30 NOTE — ED Notes (Signed)
PA made aware of pt's BP.  

## 2012-08-30 NOTE — ED Notes (Signed)
PT ambulated with baseline gait; VSS; A&Ox3; no signs of distress; respirations even and unlabored; skin warm and dry; no questions upon discharge.  

## 2012-08-30 NOTE — ED Provider Notes (Signed)
Medical screening examination/treatment/procedure(s) were performed by non-physician practitioner and as supervising physician I was immediately available for consultation/collaboration.   Charles B. Sheldon, MD 08/30/12 1523 

## 2012-09-04 ENCOUNTER — Encounter (HOSPITAL_COMMUNITY): Payer: Self-pay | Admitting: Family Medicine

## 2012-09-04 ENCOUNTER — Emergency Department (HOSPITAL_COMMUNITY)
Admission: EM | Admit: 2012-09-04 | Discharge: 2012-09-04 | Disposition: A | Discharge status: Home / Self Care | Payer: Medicare Other | Attending: Emergency Medicine | Admitting: Emergency Medicine

## 2012-09-04 DIAGNOSIS — F329 Major depressive disorder, single episode, unspecified: Secondary | ICD-10-CM | POA: Diagnosis not present

## 2012-09-04 DIAGNOSIS — M545 Low back pain, unspecified: Secondary | ICD-10-CM | POA: Insufficient documentation

## 2012-09-04 DIAGNOSIS — I10 Essential (primary) hypertension: Secondary | ICD-10-CM | POA: Diagnosis not present

## 2012-09-04 DIAGNOSIS — F411 Generalized anxiety disorder: Secondary | ICD-10-CM | POA: Insufficient documentation

## 2012-09-04 DIAGNOSIS — Z8709 Personal history of other diseases of the respiratory system: Secondary | ICD-10-CM | POA: Diagnosis not present

## 2012-09-04 DIAGNOSIS — G8929 Other chronic pain: Secondary | ICD-10-CM | POA: Insufficient documentation

## 2012-09-04 DIAGNOSIS — M129 Arthropathy, unspecified: Secondary | ICD-10-CM | POA: Insufficient documentation

## 2012-09-04 DIAGNOSIS — K219 Gastro-esophageal reflux disease without esophagitis: Secondary | ICD-10-CM | POA: Diagnosis not present

## 2012-09-04 DIAGNOSIS — Z981 Arthrodesis status: Secondary | ICD-10-CM | POA: Insufficient documentation

## 2012-09-04 DIAGNOSIS — M25519 Pain in unspecified shoulder: Secondary | ICD-10-CM | POA: Insufficient documentation

## 2012-09-04 DIAGNOSIS — Z79899 Other long term (current) drug therapy: Secondary | ICD-10-CM | POA: Insufficient documentation

## 2012-09-04 DIAGNOSIS — Z87828 Personal history of other (healed) physical injury and trauma: Secondary | ICD-10-CM | POA: Diagnosis not present

## 2012-09-04 DIAGNOSIS — F3289 Other specified depressive episodes: Secondary | ICD-10-CM | POA: Insufficient documentation

## 2012-09-04 DIAGNOSIS — M25512 Pain in left shoulder: Secondary | ICD-10-CM

## 2012-09-04 DIAGNOSIS — F17200 Nicotine dependence, unspecified, uncomplicated: Secondary | ICD-10-CM | POA: Diagnosis not present

## 2012-09-04 MED ORDER — DIAZEPAM 5 MG PO TABS: 5.0000 mg | ORAL_TABLET | Freq: Two times a day (BID) | ORAL | Status: DC

## 2012-09-04 MED ORDER — DIAZEPAM 5 MG PO TABS
5.0000 mg | ORAL_TABLET | Freq: Once | ORAL | Status: AC
  Administered 2012-09-04: 5 mg via ORAL
  Filled 2012-09-04: qty 1

## 2012-09-04 MED ORDER — PROMETHAZINE HCL 25 MG PO TABS: 25.0000 mg | ORAL_TABLET | Freq: Four times a day (QID) | ORAL | Status: DC | PRN

## 2012-09-04 MED ORDER — OXYCODONE-ACETAMINOPHEN 5-325 MG PO TABS
2.0000 | ORAL_TABLET | Freq: Once | ORAL | Status: AC
  Administered 2012-09-04: 2 via ORAL
  Filled 2012-09-04: qty 2

## 2012-09-04 MED ORDER — OXYCODONE-ACETAMINOPHEN 5-325 MG PO TABS: 2.0000 | ORAL_TABLET | Freq: Four times a day (QID) | ORAL | Status: DC | PRN

## 2012-09-04 MED ORDER — KETOROLAC TROMETHAMINE 60 MG/2ML IM SOLN
60.0000 mg | Freq: Once | INTRAMUSCULAR | Status: AC
  Administered 2012-09-04: 60 mg via INTRAMUSCULAR
  Filled 2012-09-04: qty 2

## 2012-09-04 NOTE — ED Notes (Signed)
Per pt sts left shoulder pain due to rotator cuff issues. sts was here recently and pain meds not working.

## 2012-09-04 NOTE — ED Notes (Signed)
PT reports she is calling [facility]'s office everyday trying to get in with them; has appt on 23rd but pain too much. Cannot sleep; reports valium and torodol shot helpful.

## 2012-09-04 NOTE — ED Provider Notes (Signed)
History    CSN: [id number] Arrival date & time 09/04/12  1004  First MD Initiated Contact with Patient 09/04/12 1016     Chief Complaint  Patient presents with  . Shoulder Pain   (Consider location/radiation/quality/duration/timing/severity/associated sxs/prior Treatment) HPI Comments: Is a 52 year old female presents today with left shoulder pain. She has been seeing Dr. Lajoyce Corners for cortisone injections. She's had 2 injections of the past 2 months. She is calling his office every day to get an earlier appointment. Currently her appointment is scheduled for the 23rd. She has a known rotator cuff injury in her left shoulder. She was seen in the emergency department for shoulder pain previously and given Valium and Toradol. She states this made the pain more bearable. The pain is a sharp stabbing pain with radiation into her left arm and up her left neck. It is worse with movement. She states she is okay as long as she keeps her arm and one single spot. She has severe pain when she falls asleep and moves her arm accidentally. She's been under a significant amount of stress recently. No new trauma to the shoulder, fever, chills, nausea, vomiting, abdominal pain.   Patient is a 52 y.o. female presenting with shoulder pain. The history is provided by the patient. No language interpreter was used.  Shoulder Pain Associated symptoms include arthralgias, joint swelling and myalgias. Pertinent negatives include no abdominal pain, chest pain, chills, fever, nausea or vomiting.   Past Medical History  Diagnosis Date  . Anxiety   . Hypertension   . Chronic low back pain 1991    disk s/p 4 diskectomies, 5th fursion, then spinal cord stimulator (Cabbell)  . Bronchitis     2 weeks ago rx regime  . Depression   . GERD (gastroesophageal reflux disease)   . Arthritis   . Rotator cuff disorder    Past Surgical History  Procedure Laterality Date  . Spine surgery  7/ 12 last    5 previous  . Stimulator   12    spine,removed 7/12  . Cesarean section  86  . Lumbar fusion  04/27/2011   Family History  Problem Relation Age of Onset  . Stroke Father   . Hypertension Father    History  Substance Use Topics  . Smoking status: Current Every Day Smoker -- 1.00 packs/day for 30 years    Types: Cigarettes  . Smokeless tobacco: Never Used     Comment: prior trial of zyban made her feel weird   . Alcohol Use: No   OB History   Grav Para Term Preterm Abortions TAB SAB Ect Mult Living                 Review of Systems  Constitutional: Negative for fever and chills.  Respiratory: Negative for shortness of breath.   Cardiovascular: Negative for chest pain.  Gastrointestinal: Negative for nausea, vomiting and abdominal pain.  Musculoskeletal: Positive for myalgias, joint swelling and arthralgias.  All other systems reviewed and are negative.    Allergies  Review of patient's allergies indicates no known allergies.  Home Medications   Current Outpatient Rx  Name  Route  Sig  Dispense  Refill  . FLUoxetine (PROZAC) 20 MG tablet   Oral   Take 20 mg by mouth daily.         Marland Kitchen ibuprofen (ADVIL,MOTRIN) 200 MG tablet   Oral   Take 800 mg by mouth every 6 (six) hours as needed for pain.          Marland Kitchen  lansoprazole (PREVACID) 30 MG capsule   Oral   Take 30 mg by mouth daily.          Marland Kitchen oxyCODONE-acetaminophen (PERCOCET/ROXICET) 5-325 MG per tablet   Oral   Take 1-2 tablets by mouth every 6 (six) hours as needed for pain.   15 tablet   0   . promethazine (PHENERGAN) 25 MG tablet   Oral   Take 25 mg by mouth every 6 (six) hours as needed for nausea.         . valsartan (DIOVAN) 160 MG tablet   Oral   Take 1 tablet (160 mg total) by mouth daily.   30 tablet   0   . cyclobenzaprine (FLEXERIL) 10 MG tablet   Oral   Take 1 tablet (10 mg total) by mouth 2 (two) times daily as needed for muscle spasms.   20 tablet   0    BP 153/92  Pulse 92  Temp(Src) 98.2 F (36.8 C)   Resp 18  SpO2 98%  LMP 08/28/2012 Physical Exam  Nursing note and vitals reviewed. Constitutional: She is oriented to person, place, and time. She appears well-developed and well-nourished. No distress.  HENT:  Head: Normocephalic and atraumatic.  Right Ear: External ear normal.  Left Ear: External ear normal.  Nose: Nose normal.  Mouth/Throat: Oropharynx is clear and moist.  Eyes: Conjunctivae are normal.  Neck: Normal range of motion.  Cardiovascular: Normal rate, regular rhythm and normal heart sounds.   Pulmonary/Chest: Effort normal and breath sounds normal. No stridor. No respiratory distress. She has no wheezes. She has no rales.  Abdominal: Soft. She exhibits no distension.  Musculoskeletal: Normal range of motion.  Range of motion limited in left shoulder; diffusely tender to palpation - maximal point of tenderness on the posterior aspect Neurovascularly intact Range of motion of left elbow and left wrist; moves elbow and wrist without any guarding  Neurological: She is alert and oriented to person, place, and time. She has normal strength.  Skin: Skin is warm and dry. She is not diaphoretic. No erythema.  Psychiatric: Her behavior is normal. Her mood appears anxious. Her speech is rapid and/or pressured.    ED Course  Procedures (including critical care time) Labs Reviewed - No data to display No results found. 1. Left shoulder pain     MDM  Patient seeing ortho for her shoulder. No new injuries. No swelling, erythema. Neurovascularly intact. Compartment soft. Patient given toradol, valium. Sent home with small rx for percocet. Follow up with Dr. Lajoyce Corners. Return instructions given. Vital signs stable for discharge. Patient / Family / Caregiver informed of clinical course, understand medical decision-making process, and agree with plan.  Mora Bellman, PA-C 09/05/12 1524

## 2012-09-05 NOTE — ED Provider Notes (Signed)
Medical screening examination/treatment/procedure(s) were performed by non-physician practitioner and as supervising physician I was immediately available for consultation/collaboration.  Trivia Heffelfinger T Alyah Boehning, MD 09/05/12 1531 

## 2012-09-07 DIAGNOSIS — M67919 Unspecified disorder of synovium and tendon, unspecified shoulder: Secondary | ICD-10-CM | POA: Diagnosis not present

## 2012-10-01 DIAGNOSIS — M67919 Unspecified disorder of synovium and tendon, unspecified shoulder: Secondary | ICD-10-CM | POA: Diagnosis not present

## 2012-10-09 DIAGNOSIS — F331 Major depressive disorder, recurrent, moderate: Secondary | ICD-10-CM | POA: Diagnosis not present

## 2012-10-09 DIAGNOSIS — F339 Major depressive disorder, recurrent, unspecified: Secondary | ICD-10-CM | POA: Diagnosis not present

## 2012-10-31 ENCOUNTER — Encounter (HOSPITAL_COMMUNITY): Payer: Self-pay | Admitting: Emergency Medicine

## 2012-10-31 ENCOUNTER — Emergency Department (HOSPITAL_COMMUNITY)
Admission: EM | Admit: 2012-10-31 | Discharge: 2012-10-31 | Disposition: A | Discharge status: Home / Self Care | Payer: Medicare Other | Attending: Emergency Medicine | Admitting: Emergency Medicine

## 2012-10-31 DIAGNOSIS — M129 Arthropathy, unspecified: Secondary | ICD-10-CM | POA: Diagnosis not present

## 2012-10-31 DIAGNOSIS — I10 Essential (primary) hypertension: Secondary | ICD-10-CM | POA: Insufficient documentation

## 2012-10-31 DIAGNOSIS — S335XXA Sprain of ligaments of lumbar spine, initial encounter: Secondary | ICD-10-CM | POA: Insufficient documentation

## 2012-10-31 DIAGNOSIS — F329 Major depressive disorder, single episode, unspecified: Secondary | ICD-10-CM | POA: Insufficient documentation

## 2012-10-31 DIAGNOSIS — K219 Gastro-esophageal reflux disease without esophagitis: Secondary | ICD-10-CM | POA: Insufficient documentation

## 2012-10-31 DIAGNOSIS — IMO0002 Reserved for concepts with insufficient information to code with codable children: Secondary | ICD-10-CM | POA: Insufficient documentation

## 2012-10-31 DIAGNOSIS — F17200 Nicotine dependence, unspecified, uncomplicated: Secondary | ICD-10-CM | POA: Insufficient documentation

## 2012-10-31 DIAGNOSIS — F411 Generalized anxiety disorder: Secondary | ICD-10-CM | POA: Diagnosis not present

## 2012-10-31 DIAGNOSIS — M549 Dorsalgia, unspecified: Secondary | ICD-10-CM

## 2012-10-31 DIAGNOSIS — Y9389 Activity, other specified: Secondary | ICD-10-CM | POA: Insufficient documentation

## 2012-10-31 DIAGNOSIS — Z8709 Personal history of other diseases of the respiratory system: Secondary | ICD-10-CM | POA: Diagnosis not present

## 2012-10-31 DIAGNOSIS — G8929 Other chronic pain: Secondary | ICD-10-CM | POA: Insufficient documentation

## 2012-10-31 DIAGNOSIS — Y929 Unspecified place or not applicable: Secondary | ICD-10-CM | POA: Insufficient documentation

## 2012-10-31 DIAGNOSIS — Z79899 Other long term (current) drug therapy: Secondary | ICD-10-CM | POA: Diagnosis not present

## 2012-10-31 DIAGNOSIS — S39012A Strain of muscle, fascia and tendon of lower back, initial encounter: Secondary | ICD-10-CM

## 2012-10-31 DIAGNOSIS — F3289 Other specified depressive episodes: Secondary | ICD-10-CM | POA: Insufficient documentation

## 2012-10-31 DIAGNOSIS — X500XXA Overexertion from strenuous movement or load, initial encounter: Secondary | ICD-10-CM | POA: Insufficient documentation

## 2012-10-31 MED ORDER — DIAZEPAM 5 MG PO TABS
5.0000 mg | ORAL_TABLET | Freq: Once | ORAL | Status: AC
  Administered 2012-10-31: 5 mg via ORAL
  Filled 2012-10-31: qty 1

## 2012-10-31 MED ORDER — TRAMADOL HCL 50 MG PO TABS
50.0000 mg | ORAL_TABLET | Freq: Once | ORAL | Status: AC
  Administered 2012-10-31: 50 mg via ORAL
  Filled 2012-10-31: qty 1

## 2012-10-31 MED ORDER — DIAZEPAM 5 MG PO TABS: 5.0000 mg | ORAL_TABLET | Freq: Two times a day (BID) | ORAL | Status: DC

## 2012-10-31 NOTE — ED Notes (Signed)
C/o lower and upper back pain onset yest after trying to pick up a TV yest. States the TV weighted approx. 100 lbs. Took several valium yest and was able to get relief .

## 2012-10-31 NOTE — ED Provider Notes (Signed)
Medical screening examination/treatment/procedure(s) were performed by non-physician practitioner and as supervising physician I was immediately available for consultation/collaboration.  Shana Younge L Arnelle Nale, MD 10/31/12 1602 

## 2012-10-31 NOTE — ED Notes (Signed)
Pt reports she was doing heavy lifting on Monday. Has hx of back pain and back surgery. States she strained her back lifting. Took her home po valium which states helped with the pain but has run out. Pt alert, oriented x4, ambulatory to room.

## 2012-10-31 NOTE — ED Provider Notes (Signed)
CSN: [id number]     Arrival date & time 10/31/12  0848 History   First MD Initiated Contact with Patient 10/31/12 [phone]     Chief Complaint  Patient presents with  . Back Pain   (Consider location/radiation/quality/duration/timing/severity/associated sxs/prior Treatment) HPI Comments: 52 year old female presents emergency department complaining of low back pain after lifting a heavy TV 3 days ago. Patient states that she be was too heavy for her and she "strained her back". Since then the pain has been constant, worse with bending rated 8/10. She tried taking Percocet with some relief. She took a leftover Valium which did provide a good amount of relief of her symptoms. Denies pain, numbness or tingling radiating down her legs. No loss of control bowels or bladder or saddle anesthesia.  Patient is a 52 y.o. female presenting with back pain. The history is provided by the patient.  Back Pain Associated symptoms: no numbness and no weakness     Past Medical History  Diagnosis Date  . Anxiety   . Hypertension   . Chronic low back pain 1991    disk s/p 4 diskectomies, 5th fursion, then spinal cord stimulator (Cabbell)  . Bronchitis     2 weeks ago rx regime  . Depression   . GERD (gastroesophageal reflux disease)   . Arthritis   . Rotator cuff disorder    Past Surgical History  Procedure Laterality Date  . Spine surgery  7/ 12 last    5 previous  . Stimulator  12    spine,removed 7/12  . Cesarean section  84  . Lumbar fusion  04/27/2011   Family History  Problem Relation Age of Onset  . Stroke Father   . Hypertension Father    History  Substance Use Topics  . Smoking status: Current Every Day Smoker -- 1.00 packs/day for 30 years    Types: Cigarettes  . Smokeless tobacco: Never Used     Comment: prior trial of zyban made her feel weird   . Alcohol Use: No   OB History   Grav Para Term Preterm Abortions TAB SAB Ect Mult Living                 Review of Systems   Musculoskeletal: Positive for back pain.  Neurological: Negative for weakness and numbness.  All other systems reviewed and are negative.    Allergies  Review of patient's allergies indicates no known allergies.  Home Medications   Current Outpatient Rx  Name  Route  Sig  Dispense  Refill  . diazepam (VALIUM) 5 MG tablet   Oral   Take 1 tablet (5 mg total) by mouth 2 (two) times daily.   15 tablet   0   . FLUoxetine (PROZAC) 20 MG tablet   Oral   Take 20 mg by mouth daily.         Marland Kitchen gabapentin (NEURONTIN) 300 MG capsule   Oral   Take 300 mg by mouth 3 (three) times daily.         Marland Kitchen ibuprofen (ADVIL,MOTRIN) 200 MG tablet   Oral   Take 800 mg by mouth every 6 (six) hours as needed for pain.          Marland Kitchen lansoprazole (PREVACID) 30 MG capsule   Oral   Take 30 mg by mouth daily.          . naproxen sodium (ANAPROX) 220 MG tablet   Oral   Take 220 mg by mouth 2 (two)  times daily as needed (for pain).         . promethazine (PHENERGAN) 25 MG tablet   Oral   Take 25 mg by mouth every 6 (six) hours as needed for nausea.         . valsartan (DIOVAN) 160 MG tablet   Oral   Take 1 tablet (160 mg total) by mouth daily.   30 tablet   0    BP 156/93  Pulse 81  Temp(Src) 98 F (36.7 C) (Oral)  Resp 16  SpO2 100% Physical Exam  Nursing note and vitals reviewed. Constitutional: She is oriented to person, place, and time. She appears well-developed and well-nourished. No distress.  HENT:  Head: Normocephalic and atraumatic.  Mouth/Throat: Oropharynx is clear and moist.  Eyes: Conjunctivae are normal.  Neck: Normal range of motion. Neck supple.  Cardiovascular: Normal rate, regular rhythm and normal heart sounds.   Pulmonary/Chest: Effort normal and breath sounds normal.  Abdominal: There is no tenderness.  Musculoskeletal: Normal range of motion. She exhibits no edema.  Tender to palpation of bilateral paralumbar muscles. No bony tenderness. Inability  without difficulty. Strength lower extremity 5 over 5 and equal bilateral.  Neurological: She is alert and oriented to person, place, and time.  No sensory deficit.  Skin: Skin is warm and dry. She is not diaphoretic.  Psychiatric: She has a normal mood and affect. Her behavior is normal.    ED Course  Procedures (including critical care time) Labs Review Labs Reviewed - No data to display Imaging Review No results found.  MDM   1. Lumbar strain, initial encounter   2. Back pain      His lumbar strain after lifting a TV. No red flags concerning patient's back pain. No s/s of central cord compression or cauda equina. Lower extremities are neurovascularly intact and patient is ambulating without difficulty. Patient requesting a shot of Dilaudid, I do not feel narcotic pain medication is appropriate at this time. I will discharge her with Valium, advised NSAIDs, rest, ice/heat. Return precautions discussed. Patient states understanding of plan and is agreeable.  Trevor Mace, PA-C 10/31/12 [phone]

## 2012-11-01 DIAGNOSIS — M67919 Unspecified disorder of synovium and tendon, unspecified shoulder: Secondary | ICD-10-CM | POA: Diagnosis not present

## 2012-11-12 DIAGNOSIS — M545 Low back pain: Secondary | ICD-10-CM | POA: Diagnosis not present

## 2012-11-26 ENCOUNTER — Emergency Department (HOSPITAL_COMMUNITY)
Admission: EM | Admit: 2012-11-26 | Discharge: 2012-11-26 | Disposition: A | Discharge status: Home / Self Care | Payer: Medicare Other | Attending: Emergency Medicine | Admitting: Emergency Medicine

## 2012-11-26 ENCOUNTER — Encounter (HOSPITAL_COMMUNITY): Payer: Self-pay | Admitting: Emergency Medicine

## 2012-11-26 DIAGNOSIS — K219 Gastro-esophageal reflux disease without esophagitis: Secondary | ICD-10-CM | POA: Diagnosis not present

## 2012-11-26 DIAGNOSIS — F3289 Other specified depressive episodes: Secondary | ICD-10-CM | POA: Insufficient documentation

## 2012-11-26 DIAGNOSIS — M25512 Pain in left shoulder: Secondary | ICD-10-CM

## 2012-11-26 DIAGNOSIS — G8929 Other chronic pain: Secondary | ICD-10-CM | POA: Diagnosis not present

## 2012-11-26 DIAGNOSIS — Z79899 Other long term (current) drug therapy: Secondary | ICD-10-CM | POA: Insufficient documentation

## 2012-11-26 DIAGNOSIS — F329 Major depressive disorder, single episode, unspecified: Secondary | ICD-10-CM | POA: Insufficient documentation

## 2012-11-26 DIAGNOSIS — M25519 Pain in unspecified shoulder: Secondary | ICD-10-CM | POA: Insufficient documentation

## 2012-11-26 DIAGNOSIS — Z8739 Personal history of other diseases of the musculoskeletal system and connective tissue: Secondary | ICD-10-CM | POA: Insufficient documentation

## 2012-11-26 DIAGNOSIS — F17200 Nicotine dependence, unspecified, uncomplicated: Secondary | ICD-10-CM | POA: Diagnosis not present

## 2012-11-26 DIAGNOSIS — I10 Essential (primary) hypertension: Secondary | ICD-10-CM | POA: Insufficient documentation

## 2012-11-26 DIAGNOSIS — F411 Generalized anxiety disorder: Secondary | ICD-10-CM | POA: Insufficient documentation

## 2012-11-26 DIAGNOSIS — Z8709 Personal history of other diseases of the respiratory system: Secondary | ICD-10-CM | POA: Diagnosis not present

## 2012-11-26 MED ORDER — OXYCODONE-ACETAMINOPHEN 5-325 MG PO TABS
2.0000 | ORAL_TABLET | Freq: Once | ORAL | Status: AC
  Administered 2012-11-26: 2 via ORAL
  Filled 2012-11-26: qty 2

## 2012-11-26 MED ORDER — DIAZEPAM 5 MG PO TABS: 5.0000 mg | ORAL_TABLET | Freq: Two times a day (BID) | ORAL | Status: DC

## 2012-11-26 MED ORDER — VALSARTAN 160 MG PO TABS: 160.0000 mg | ORAL_TABLET | Freq: Every day | ORAL | Status: DC

## 2012-11-26 MED ORDER — DIAZEPAM 5 MG PO TABS
5.0000 mg | ORAL_TABLET | Freq: Once | ORAL | Status: AC
  Administered 2012-11-26: 5 mg via ORAL
  Filled 2012-11-26: qty 1

## 2012-11-26 NOTE — ED Provider Notes (Signed)
Medical screening examination/treatment/procedure(s) were performed by non-physician practitioner and as supervising physician I was immediately available for consultation/collaboration.  Sukhdeep Wieting, MD 11/26/12 1525 

## 2012-11-26 NOTE — ED Notes (Signed)
Pt has chronic left shoulder pain. States has had "4 injections", the last 1 month ago. States "I just want some relief today, not a prescription, then I'll see Dr. Lajoyce Corners on Friday".

## 2012-11-26 NOTE — ED Notes (Signed)
Pt here c/o chronic left shoulder pain x months; pt sts out of valium that helps pain; pt sts has appointment on Friday to see Dr Lajoyce Corners

## 2012-11-26 NOTE — Progress Notes (Signed)
Orthopedic Tech Progress Note Patient Details:  Madeline Dickerson [birth year] [id number]  Ortho Devices Type of Ortho Device: Arm sling Ortho Device/Splint Interventions: Application   Cammer, Mickie Bail 11/26/2012, 12:58 PM

## 2012-11-26 NOTE — ED Provider Notes (Signed)
CSN: [id number]     Arrival date & time 11/26/12  1023 History   This chart was scribed for non-physician practitioner Jaynie Crumble, PA-C working with Geoffery Lyons, MD by Valera Castle, ED scribe. This patient was seen in room TR09C/TR09C and the patient's care was started at 12:16 AM.    Chief Complaint  Patient presents with  . Shoulder Pain    The history is provided by the patient. No language interpreter was used.   HPI Comments: Madeline Dickerson is a 52 y.o. female with a h/o arthritis, spine surgery, and chronic lower back pain who presents to the Emergency Department complaining of chronic, moderate left shoulder pain for months. She states that the shoulder pain has worsened over the last few days. She denies any numbness or weakness to her left arm. She states that she has been taking Valium and Percocet, with some relief, but that her medications have run out. She requests that only her Valium get refilled. She reports that she has an appointment with Dr. Augusto Garbe this Friday. She denies fever, and any other associated symptoms. She has no known allergies. She also has a medical h/o anxiety, hypertension, bronchitis, depression, and GERD.    Past Medical History  Diagnosis Date  . Anxiety   . Hypertension   . Chronic low back pain 1991    disk s/p 4 diskectomies, 5th fursion, then spinal cord stimulator (Cabbell)  . Bronchitis     2 weeks ago rx regime  . Depression   . GERD (gastroesophageal reflux disease)   . Arthritis   . Rotator cuff disorder    Past Surgical History  Procedure Laterality Date  . Spine surgery  7/ 12 last    5 previous  . Stimulator  12    spine,removed 7/12  . Cesarean section  86  . Lumbar fusion  04/27/2011   Family History  Problem Relation Age of Onset  . Stroke Father   . Hypertension Father    History  Substance Use Topics  . Smoking status: Current Every Day Smoker -- 1.00 packs/day for 30 years    Types: Cigarettes  . Smokeless  tobacco: Never Used     Comment: prior trial of zyban made her feel weird   . Alcohol Use: No   OB History   Grav Para Term Preterm Abortions TAB SAB Ect Mult Living                 Review of Systems  Musculoskeletal:       Left shoulder pain.  Neurological: Negative for weakness and numbness.  All other systems reviewed and are negative.    Allergies  Review of patient's allergies indicates no known allergies.  Home Medications   Current Outpatient Rx  Name  Route  Sig  Dispense  Refill  . diazepam (VALIUM) 5 MG tablet   Oral   Take 1 tablet (5 mg total) by mouth 2 (two) times daily.   10 tablet   0   . FLUoxetine (PROZAC) 20 MG tablet   Oral   Take 20 mg by mouth daily.         Marland Kitchen gabapentin (NEURONTIN) 300 MG capsule   Oral   Take 300 mg by mouth 3 (three) times daily.         Marland Kitchen HYDROcodone-acetaminophen (NORCO/VICODIN) 5-325 MG per tablet   Oral   Take 1 tablet by mouth every 6 (six) hours as needed for pain.         Marland Kitchen  lansoprazole (PREVACID) 30 MG capsule   Oral   Take 30 mg by mouth daily.          . promethazine (PHENERGAN) 25 MG tablet   Oral   Take 25 mg by mouth every 6 (six) hours as needed for nausea.         . valsartan (DIOVAN) 160 MG tablet   Oral   Take 1 tablet (160 mg total) by mouth daily.   30 tablet   0   . diazepam (VALIUM) 5 MG tablet   Oral   Take 1 tablet (5 mg total) by mouth 2 (two) times daily.   15 tablet   0   . valsartan (DIOVAN) 160 MG tablet   Oral   Take 1 tablet (160 mg total) by mouth daily.   30 tablet   0    Triage Vitals: BP 177/97  Pulse 90  Temp(Src) 98.3 F (36.8 C) (Oral)  Resp 18  Ht 5\' 6"  (1.676 m)  Wt 188 lb 12.8 oz (85.639 kg)  BMI 30.49 kg/m2  SpO2 99%  Physical Exam  Nursing note and vitals reviewed. Constitutional: She is oriented to person, place, and time. She appears well-developed and well-nourished. No distress.  HENT:  Head: Normocephalic.  Neck: Normal range of motion.  Neck supple.  Cardiovascular: Normal rate, regular rhythm and normal heart sounds.   Pulmonary/Chest: Effort normal and breath sounds normal. No respiratory distress. She has no wheezes. She has no rales.  Musculoskeletal:  Normal appearing left shoulder with no erythema, swelling, bruising. Shoulder joint is nontender. Pain with any range of motion at the shoulder joint including flexion, extension, abduction. Unable to lift her hand above 90. Positive straight arm drop test. Bicep and tricep muscles intact with normal strength. Distal radial pulses intact. Grip strength is normal  Neurological: She is alert and oriented to person, place, and time. No cranial nerve deficit. Coordination normal.  Skin: Skin is warm and dry.    ED Course  Procedures (including critical care time)  DIAGNOSTIC STUDIES: Oxygen Saturation is 99% on room air, normal by my interpretation.    COORDINATION OF CARE: 12:19 PM-Discussed treatment plan which includes Percocet and Valium with pt at bedside and pt agreed to plan. Will discharge with Valium and Diovan.     Labs Review Labs Reviewed - No data to display Imaging Review No results found.  Meds ordered this encounter  Medications  . HYDROcodone-acetaminophen (NORCO/VICODIN) 5-325 MG per tablet    Sig: Take 1 tablet by mouth every 6 (six) hours as needed for pain.  Marland Kitchen oxyCODONE-acetaminophen (PERCOCET/ROXICET) 5-325 MG per tablet 2 tablet    Sig:   . diazepam (VALIUM) tablet 5 mg    Sig:   . diazepam (VALIUM) 5 MG tablet    Sig: Take 1 tablet (5 mg total) by mouth 2 (two) times daily.    Dispense:  15 tablet    Refill:  0    Order Specific Question:  Supervising Provider    Answer:  Eber Hong D [[id number]]  . valsartan (DIOVAN) 160 MG tablet    Sig: Take 1 tablet (160 mg total) by mouth daily.    Dispense:  30 tablet    Refill:  0    Order Specific Question:  Supervising Provider    Answer:  Eber Hong D [[id number]]     MDM   1. Shoulder  pain, left    He should is here with chronic left shoulder pain that  worsened. No injuries. No signs of infection. She's afebrile. New injuries therefore do not think any imaging recommended at this time. She has an appointment with Dr. due to 5 days. Her blood pressure is elevated in emergency department she states that she ran out of Diovan asking for refill, will refill her Diovan. She is instructed to followup with Dr.Duda to scheduled.  Filed Vitals:   11/26/12 1049  BP: 177/97  Pulse: 90  Temp: 98.3 F (36.8 C)  Resp: 18      I personally performed the services described in this documentation, which was scribed in my presence. The recorded information has been reviewed and is accurate.    Lottie Mussel, PA-C 11/26/12 1231

## 2012-11-29 ENCOUNTER — Encounter (HOSPITAL_COMMUNITY): Payer: Self-pay | Admitting: Emergency Medicine

## 2012-11-29 ENCOUNTER — Emergency Department (HOSPITAL_COMMUNITY)
Admission: EM | Admit: 2012-11-29 | Discharge: 2012-11-29 | Disposition: A | Discharge status: Home / Self Care | Payer: Medicare Other | Attending: Emergency Medicine | Admitting: Emergency Medicine

## 2012-11-29 DIAGNOSIS — M545 Low back pain: Secondary | ICD-10-CM | POA: Diagnosis not present

## 2012-11-29 DIAGNOSIS — F17200 Nicotine dependence, unspecified, uncomplicated: Secondary | ICD-10-CM | POA: Insufficient documentation

## 2012-11-29 DIAGNOSIS — F411 Generalized anxiety disorder: Secondary | ICD-10-CM | POA: Insufficient documentation

## 2012-11-29 DIAGNOSIS — J40 Bronchitis, not specified as acute or chronic: Secondary | ICD-10-CM | POA: Diagnosis not present

## 2012-11-29 DIAGNOSIS — M129 Arthropathy, unspecified: Secondary | ICD-10-CM | POA: Insufficient documentation

## 2012-11-29 DIAGNOSIS — G8929 Other chronic pain: Secondary | ICD-10-CM | POA: Insufficient documentation

## 2012-11-29 DIAGNOSIS — F329 Major depressive disorder, single episode, unspecified: Secondary | ICD-10-CM | POA: Insufficient documentation

## 2012-11-29 DIAGNOSIS — IMO0001 Reserved for inherently not codable concepts without codable children: Secondary | ICD-10-CM | POA: Diagnosis not present

## 2012-11-29 DIAGNOSIS — I10 Essential (primary) hypertension: Secondary | ICD-10-CM | POA: Insufficient documentation

## 2012-11-29 DIAGNOSIS — K219 Gastro-esophageal reflux disease without esophagitis: Secondary | ICD-10-CM | POA: Insufficient documentation

## 2012-11-29 DIAGNOSIS — M25519 Pain in unspecified shoulder: Secondary | ICD-10-CM | POA: Insufficient documentation

## 2012-11-29 DIAGNOSIS — Z79899 Other long term (current) drug therapy: Secondary | ICD-10-CM | POA: Diagnosis not present

## 2012-11-29 DIAGNOSIS — F3289 Other specified depressive episodes: Secondary | ICD-10-CM | POA: Insufficient documentation

## 2012-11-29 MED ORDER — KETOROLAC TROMETHAMINE 30 MG/ML IJ SOLN
60.0000 mg | Freq: Once | INTRAMUSCULAR | Status: AC
  Administered 2012-11-29: 60 mg via INTRAMUSCULAR
  Filled 2012-11-29: qty 2

## 2012-11-29 MED ORDER — PREDNISONE 20 MG PO TABS
60.0000 mg | ORAL_TABLET | Freq: Once | ORAL | Status: AC
  Administered 2012-11-29: 60 mg via ORAL
  Filled 2012-11-29: qty 3

## 2012-11-29 NOTE — ED Provider Notes (Signed)
Medical screening examination/treatment/procedure(s) were performed by non-physician practitioner and as supervising physician I was immediately available for consultation/collaboration.   Brandt Loosen, MD 11/29/12 [phone]

## 2012-11-29 NOTE — ED Provider Notes (Signed)
CSN: [id number]     Arrival date & time 11/29/12  0542 History   None    Chief Complaint  Patient presents with  . Shoulder Pain   (Consider location/radiation/quality/duration/timing/severity/associated sxs/prior Treatment) HPI Comments: 52 year old female with a history of arthritis and chronic low back pain as well as rotator cuff injury presents for chronic, constant left shoulder pain x1 week, worsening x24 hours. Patient was seen for the same complaint 3 days ago. She denies any new or worsening symptoms at this time as well as any new trauma to her left shoulder. She has been taking ibuprofen and Valium for symptoms with mild to moderate relief. She is also been using a shoulder sling which she states helps her most of the time. She denies any pallor, numbness or weakness, and fevers.  Patient is a 52 y.o. female presenting with shoulder pain. The history is provided by the patient. No language interpreter was used.  Shoulder Pain This is a recurrent problem. The current episode started 1 to 4 weeks ago. The problem occurs constantly. The problem has been gradually worsening. Associated symptoms include arthralgias and myalgias. Pertinent negatives include no diaphoresis, fever, joint swelling, neck pain, numbness, rash or weakness. Exacerbated by: Movement. She has tried NSAIDs, oral narcotics and immobilization (And Valium) for the symptoms. Improvement on treatment: Mild to moderate.    Past Medical History  Diagnosis Date  . Anxiety   . Hypertension   . Chronic low back pain 1991    disk s/p 4 diskectomies, 5th fursion, then spinal cord stimulator ([facility]ell)  . Bronchitis     2 weeks ago rx regime  . Depression   . GERD (gastroesophageal reflux disease)   . Arthritis   . Rotator cuff disorder    Past Surgical History  Procedure Laterality Date  . Spine surgery  7/ 12 last    5 previous  . Stimulator  12    spine,removed 7/12  . Cesarean section  86  . Lumbar fusion   04/27/2011   Family History  Problem Relation Age of Onset  . Stroke Father   . Hypertension Father    History  Substance Use Topics  . Smoking status: Current Every Day Smoker -- 1.00 packs/day for 30 years    Types: Cigarettes  . Smokeless tobacco: Never Used     Comment: prior trial of zyban made her feel weird   . Alcohol Use: No   OB History   Grav Para Term Preterm Abortions TAB SAB Ect Mult Living                 Review of Systems  Constitutional: Negative for fever and diaphoresis.  Musculoskeletal: Positive for arthralgias and myalgias. Negative for joint swelling and neck pain.  Skin: Negative for color change, pallor and rash.  Neurological: Negative for weakness and numbness.  All other systems reviewed and are negative.    Allergies  Review of patient's allergies indicates no known allergies.  Home Medications   Current Outpatient Rx  Name  Route  Sig  Dispense  Refill  . diazepam (VALIUM) 5 MG tablet   Oral   Take 1 tablet (5 mg total) by mouth 2 (two) times daily.   10 tablet   0   . FLUoxetine (PROZAC) 20 MG tablet   Oral   Take 20 mg by mouth daily.         Marland Kitchen gabapentin (NEURONTIN) 300 MG capsule   Oral   Take 300 mg  by mouth 3 (three) times daily.         Marland Kitchen HYDROcodone-acetaminophen (NORCO/VICODIN) 5-325 MG per tablet   Oral   Take 1 tablet by mouth every 6 (six) hours as needed for pain.         Marland Kitchen ibuprofen (ADVIL,MOTRIN) 200 MG tablet   Oral   Take 400 mg by mouth every 6 (six) hours as needed for pain.         Marland Kitchen lansoprazole (PREVACID) 30 MG capsule   Oral   Take 30 mg by mouth daily.          . promethazine (PHENERGAN) 25 MG tablet   Oral   Take 25 mg by mouth every 6 (six) hours as needed for nausea.         . valsartan (DIOVAN) 160 MG tablet   Oral   Take 1 tablet (160 mg total) by mouth daily.   30 tablet   0    BP 121/89  Pulse 75  Temp(Src) 97.4 F (36.3 C) (Oral)  Resp 18  SpO2 98%  Physical Exam   Nursing note and vitals reviewed. Constitutional: She is oriented to person, place, and time. She appears well-developed and well-nourished. No distress.  HENT:  Head: Normocephalic and atraumatic.  Eyes: Conjunctivae and EOM are normal. No scleral icterus.  Neck: Normal range of motion.  Cardiovascular: Normal rate, regular rhythm and intact distal pulses.   DP and PT pulses 2+ bilaterally.  Pulmonary/Chest: Effort normal. No respiratory distress.  Musculoskeletal:       Left shoulder: She exhibits decreased range of motion (secondary to discomfort), tenderness and pain. She exhibits no bony tenderness, no swelling, no effusion, no crepitus, no deformity, no spasm, normal pulse and normal strength.       Cervical back: Normal.       Left upper arm: Normal.  No effusion, erythema, heat-to-touch, or swelling appreciated to the left shoulder.  Neurological: She is alert and oriented to person, place, and time.  No sensory or motor deficits appreciated. Equal grip strength bilaterally.  Skin: Skin is warm and dry. No rash noted. She is not diaphoretic. No erythema. No pallor.  Psychiatric: She has a normal mood and affect. Her behavior is normal.    ED Course  Procedures (including critical care time) Labs Review Labs Reviewed - No data to display Imaging Review No results found.  MDM   1. Chronic shoulder pain, left    52 year old female with a history of rotator cuff disorder presents for left shoulder pain worsening x1 week. Patient was seen for the same complaint 3 days ago. She denies any new or worsening symptoms since this time as well as any trauma or injury to her left shoulder. Patient is neurovascularly intact, afebrile, and hemodynamically stable. No evidence of septic/infectious joint. Patient treated in ED with IM Toradol and prednisone. She states she has followup tomorrow with Dr. Lajoyce Corners. I stressed to the patient that it is important that she keep this appointment  tomorrow. Patient verbalizes understanding. Patient appropriate for discharge with orthopedic surgery for followup. Do not believe any additional prescriptions are warranted at this time. Return precautions discussed and patient agreeable to plan with no unaddressed concerns.     Antony Madura, PA-C 11/29/12 [phone]

## 2012-11-29 NOTE — ED Notes (Signed)
Pt c/o chronic left shoulder pain x 3 months. Current pain progressively worsening x 1 week. Has appointment tomorrow for cortisone shot.

## 2012-11-29 NOTE — ED Notes (Signed)
Pt states understanding of discharge instructions 

## 2012-11-30 DIAGNOSIS — M67919 Unspecified disorder of synovium and tendon, unspecified shoulder: Secondary | ICD-10-CM | POA: Diagnosis not present

## 2012-12-07 DIAGNOSIS — E119 Type 2 diabetes mellitus without complications: Secondary | ICD-10-CM | POA: Diagnosis not present

## 2012-12-07 DIAGNOSIS — I10 Essential (primary) hypertension: Secondary | ICD-10-CM | POA: Diagnosis not present

## 2012-12-07 DIAGNOSIS — J019 Acute sinusitis, unspecified: Secondary | ICD-10-CM | POA: Diagnosis not present

## 2012-12-26 DIAGNOSIS — F339 Major depressive disorder, recurrent, unspecified: Secondary | ICD-10-CM | POA: Diagnosis not present

## 2012-12-26 DIAGNOSIS — F331 Major depressive disorder, recurrent, moderate: Secondary | ICD-10-CM | POA: Diagnosis not present

## 2013-02-02 ENCOUNTER — Emergency Department (HOSPITAL_COMMUNITY)
Admission: EM | Admit: 2013-02-02 | Discharge: 2013-02-02 | Disposition: A | Discharge status: Home / Self Care | Payer: Medicare Other | Attending: Emergency Medicine | Admitting: Emergency Medicine

## 2013-02-02 ENCOUNTER — Encounter (HOSPITAL_COMMUNITY): Payer: Self-pay | Admitting: Emergency Medicine

## 2013-02-02 DIAGNOSIS — Z79899 Other long term (current) drug therapy: Secondary | ICD-10-CM | POA: Insufficient documentation

## 2013-02-02 DIAGNOSIS — K219 Gastro-esophageal reflux disease without esophagitis: Secondary | ICD-10-CM | POA: Diagnosis not present

## 2013-02-02 DIAGNOSIS — M129 Arthropathy, unspecified: Secondary | ICD-10-CM | POA: Insufficient documentation

## 2013-02-02 DIAGNOSIS — W108XXA Fall (on) (from) other stairs and steps, initial encounter: Secondary | ICD-10-CM | POA: Insufficient documentation

## 2013-02-02 DIAGNOSIS — F411 Generalized anxiety disorder: Secondary | ICD-10-CM | POA: Diagnosis not present

## 2013-02-02 DIAGNOSIS — S0993XA Unspecified injury of face, initial encounter: Secondary | ICD-10-CM | POA: Insufficient documentation

## 2013-02-02 DIAGNOSIS — F329 Major depressive disorder, single episode, unspecified: Secondary | ICD-10-CM | POA: Diagnosis not present

## 2013-02-02 DIAGNOSIS — IMO0002 Reserved for concepts with insufficient information to code with codable children: Secondary | ICD-10-CM | POA: Diagnosis not present

## 2013-02-02 DIAGNOSIS — Y9289 Other specified places as the place of occurrence of the external cause: Secondary | ICD-10-CM | POA: Insufficient documentation

## 2013-02-02 DIAGNOSIS — Z9889 Other specified postprocedural states: Secondary | ICD-10-CM | POA: Insufficient documentation

## 2013-02-02 DIAGNOSIS — X500XXA Overexertion from strenuous movement or load, initial encounter: Secondary | ICD-10-CM | POA: Insufficient documentation

## 2013-02-02 DIAGNOSIS — F17200 Nicotine dependence, unspecified, uncomplicated: Secondary | ICD-10-CM | POA: Diagnosis not present

## 2013-02-02 DIAGNOSIS — M546 Pain in thoracic spine: Secondary | ICD-10-CM | POA: Diagnosis not present

## 2013-02-02 DIAGNOSIS — M549 Dorsalgia, unspecified: Secondary | ICD-10-CM

## 2013-02-02 DIAGNOSIS — R42 Dizziness and giddiness: Secondary | ICD-10-CM | POA: Insufficient documentation

## 2013-02-02 DIAGNOSIS — M545 Low back pain: Secondary | ICD-10-CM | POA: Diagnosis not present

## 2013-02-02 DIAGNOSIS — Z8709 Personal history of other diseases of the respiratory system: Secondary | ICD-10-CM | POA: Diagnosis not present

## 2013-02-02 DIAGNOSIS — F3289 Other specified depressive episodes: Secondary | ICD-10-CM | POA: Insufficient documentation

## 2013-02-02 DIAGNOSIS — I10 Essential (primary) hypertension: Secondary | ICD-10-CM | POA: Insufficient documentation

## 2013-02-02 DIAGNOSIS — Z8739 Personal history of other diseases of the musculoskeletal system and connective tissue: Secondary | ICD-10-CM | POA: Insufficient documentation

## 2013-02-02 DIAGNOSIS — Y9301 Activity, walking, marching and hiking: Secondary | ICD-10-CM | POA: Insufficient documentation

## 2013-02-02 DIAGNOSIS — Z23 Encounter for immunization: Secondary | ICD-10-CM | POA: Diagnosis not present

## 2013-02-02 MED ORDER — HYDROCODONE-ACETAMINOPHEN 5-325 MG PO TABS: ORAL_TABLET | ORAL | Status: DC

## 2013-02-02 MED ORDER — HYDROCODONE-ACETAMINOPHEN 5-325 MG PO TABS
1.0000 | ORAL_TABLET | Freq: Once | ORAL | Status: AC
  Administered 2013-02-02: 1 via ORAL
  Filled 2013-02-02: qty 1

## 2013-02-02 MED ORDER — METHOCARBAMOL 500 MG PO TABS: 1000.0000 mg | ORAL_TABLET | Freq: Four times a day (QID) | ORAL | Status: DC

## 2013-02-02 NOTE — ED Notes (Signed)
Pt c/o mid back pain after twisting back catching self when going down stairs; pt sts hx of chronic back pain

## 2013-02-02 NOTE — ED Provider Notes (Signed)
CSN: [id number]     Arrival date & time 02/02/13  1346 History  This chart was scribed for non-physician practitioner, Renne Crigler, PA-C,working with Flint Melter, MD, by Karle Plumber, ED Scribe.  This patient was seen in room TR07C/TR07C and the patient's care was started at 3:12 PM.  Chief Complaint  Patient presents with  . Fall  . Back Pain   The history is provided by the patient. No language interpreter was used.   HPI Comments:  Madeline Dickerson is a 52 y.o. female with h/o back surgery and chronic back pain who presents to the Emergency Department complaining of severe back pain secondary to falling one day ago. Pt states she stepped the wrong way down a stair and caught herself, twisting her back in the process. She reports severe mid-back spasms that started today. She reports associated dizziness and sinus symptoms. She states she is having a hard time sleeping due to the pain. She states the pain worsens with movement and inspiration. She denies any bowel or bladder incontinence. She reports being given a cane to use to steady her gait, but denies using it. She reports her back surgeries and fusions were to her lumbar spine. She states her PCP is Dr. Parke Simmers.  Past Medical History  Diagnosis Date  . Anxiety   . Hypertension   . Chronic low back pain 1991    disk s/p 4 diskectomies, 5th fursion, then spinal cord stimulator ([facility]ell)  . Bronchitis     2 weeks ago rx regime  . Depression   . GERD (gastroesophageal reflux disease)   . Arthritis   . Rotator cuff disorder    Past Surgical History  Procedure Laterality Date  . Spine surgery  7/ 12 last    5 previous  . Stimulator  12    spine,removed 7/12  . Cesarean section  84  . Lumbar fusion  04/27/2011   Family History  Problem Relation Age of Onset  . Stroke Father   . Hypertension Father    History  Substance Use Topics  . Smoking status: Current Every Day Smoker -- 1.00 packs/day for 30 years    Types:  Cigarettes  . Smokeless tobacco: Never Used     Comment: prior trial of zyban made her feel weird   . Alcohol Use: No   OB History   Grav Para Term Preterm Abortions TAB SAB Ect Mult Living                 Review of Systems  Constitutional: Negative for fever and unexpected weight change.  Gastrointestinal: Negative for constipation.       Negative for fecal incontinence.   Genitourinary: Negative for dysuria, hematuria, flank pain, vaginal bleeding, vaginal discharge and pelvic pain.       Negative for urinary incontinence or retention.  Musculoskeletal: Positive for back pain and neck pain (mild pain reported).  Neurological: Positive for dizziness (secondary to sinuses). Negative for syncope, weakness and numbness.       Denies saddle paresthesias.    Allergies  Review of patient's allergies indicates no known allergies.  Home Medications   Current Outpatient Rx  Name  Route  Sig  Dispense  Refill  . diazepam (VALIUM) 5 MG tablet   Oral   Take 1 tablet (5 mg total) by mouth 2 (two) times daily.   10 tablet   0   . FLUoxetine (PROZAC) 20 MG tablet   Oral   Take 20  mg by mouth daily.         Marland Kitchen gabapentin (NEURONTIN) 300 MG capsule   Oral   Take 300 mg by mouth 3 (three) times daily.         . promethazine (PHENERGAN) 25 MG tablet   Oral   Take 25 mg by mouth every 6 (six) hours as needed for nausea.         . valsartan (DIOVAN) 160 MG tablet   Oral   Take 1 tablet (160 mg total) by mouth daily.   30 tablet   0   . lansoprazole (PREVACID) 30 MG capsule   Oral   Take 30 mg by mouth daily.           Triage Vitals: BP 143/82  Pulse 89  Temp(Src) 98.1 F (36.7 C) (Oral)  Resp 18  Wt 188 lb 1.6 oz (85.322 kg)  SpO2 98% Physical Exam  Nursing note and vitals reviewed. Constitutional: She appears well-developed and well-nourished.  HENT:  Head: Normocephalic and atraumatic.  Eyes: Conjunctivae are normal.  Neck: Normal range of motion. Neck  supple.  Pulmonary/Chest: Effort normal.  Abdominal: Soft. There is no tenderness. There is no CVA tenderness.  Musculoskeletal: Normal range of motion.  T-spine and L-spine paraspinal tenderness.  Neurological: She is alert. She has normal strength and normal reflexes. No cranial nerve deficit or sensory deficit.  5/5 strength in entire lower extremities bilaterally. No sensation deficit.   Skin: Skin is warm and dry. No rash noted.  Psychiatric: She has a normal mood and affect. Her behavior is normal.    ED Course  Procedures (including critical care time) DIAGNOSTIC STUDIES: Oxygen Saturation is 98% on RA, normal by my interpretation.   COORDINATION OF CARE: 3:23 PM- Will prescribe pain medication. Pt states she is not driving today. Recommended that pt follow up with PCP.  Pt verbalizes understanding and agrees to plan.  Medications - No data to display  Labs Review Labs Reviewed - No data to display Imaging Review No results found.  EKG Interpretation   None      Patient seen and examined. Medications ordered.   Vital signs reviewed and are as follows: Filed Vitals:   02/02/13 1355  BP: 143/82  Pulse: 89  Temp: 98.1 F (36.7 C)  Resp: 18    No red flag s/s of low back pain. Patient was counseled on back pain precautions and told to do activity as tolerated but do not lift, push, or pull heavy objects more than 10 pounds for the next week.  Patient counseled to use ice or heat on back for no longer than 15 minutes every hour.   Patient prescribed muscle relaxer and counseled on proper use of muscle relaxant medication.    Patient prescribed narcotic pain medicine and counseled on proper use of narcotic pain medications. Counseled not to combine this medication with others containing tylenol.   Urged patient not to drink alcohol, drive, or perform any other activities that requires focus while taking either of these medications.  Patient urged to follow-up with  PCP if pain does not improve with treatment and rest or if pain becomes recurrent. Urged to return with worsening severe pain, loss of bowel or bladder control, trouble walking.   The patient verbalizes understanding and agrees with the plan.   MDM   1. Back pain    Patient with back pain. No neurological deficits. Patient is ambulatory. No warning symptoms of back pain including: loss  of bowel or bladder control, night sweats, waking from sleep with back pain, unexplained fevers or weight loss, h/o cancer, IVDU, recent trauma. No concern for cauda equina, epidural abscess, or other serious cause of back pain. Conservative measures such as rest, ice/heat and pain medicine indicated with PCP follow-up if no improvement with conservative management.    I personally performed the services described in this documentation, which was scribed in my presence. The recorded information has been reviewed and is accurate.    Renne Crigler, PA-C 02/02/13 1816

## 2013-02-03 NOTE — ED Provider Notes (Signed)
Medical screening examination/treatment/procedure(s) were performed by non-physician practitioner and as supervising physician I was immediately available for consultation/collaboration.  Chuong Casebeer L Taylee Gunnells, MD 02/03/13 0043 

## 2013-02-06 ENCOUNTER — Encounter (HOSPITAL_COMMUNITY): Payer: Self-pay | Admitting: Emergency Medicine

## 2013-02-06 ENCOUNTER — Emergency Department (HOSPITAL_COMMUNITY): Payer: Medicare Other

## 2013-02-06 ENCOUNTER — Emergency Department (HOSPITAL_COMMUNITY)
Admission: EM | Admit: 2013-02-06 | Discharge: 2013-02-06 | Disposition: A | Discharge status: Home / Self Care | Payer: Medicare Other | Attending: Emergency Medicine | Admitting: Emergency Medicine

## 2013-02-06 DIAGNOSIS — F329 Major depressive disorder, single episode, unspecified: Secondary | ICD-10-CM | POA: Insufficient documentation

## 2013-02-06 DIAGNOSIS — R110 Nausea: Secondary | ICD-10-CM | POA: Diagnosis not present

## 2013-02-06 DIAGNOSIS — R5381 Other malaise: Secondary | ICD-10-CM | POA: Insufficient documentation

## 2013-02-06 DIAGNOSIS — R42 Dizziness and giddiness: Secondary | ICD-10-CM | POA: Insufficient documentation

## 2013-02-06 DIAGNOSIS — J3489 Other specified disorders of nose and nasal sinuses: Secondary | ICD-10-CM | POA: Insufficient documentation

## 2013-02-06 DIAGNOSIS — R05 Cough: Secondary | ICD-10-CM | POA: Diagnosis not present

## 2013-02-06 DIAGNOSIS — M129 Arthropathy, unspecified: Secondary | ICD-10-CM | POA: Diagnosis not present

## 2013-02-06 DIAGNOSIS — R079 Chest pain, unspecified: Secondary | ICD-10-CM | POA: Diagnosis not present

## 2013-02-06 DIAGNOSIS — G8929 Other chronic pain: Secondary | ICD-10-CM | POA: Insufficient documentation

## 2013-02-06 DIAGNOSIS — R0789 Other chest pain: Secondary | ICD-10-CM | POA: Insufficient documentation

## 2013-02-06 DIAGNOSIS — Z79899 Other long term (current) drug therapy: Secondary | ICD-10-CM | POA: Diagnosis not present

## 2013-02-06 DIAGNOSIS — R062 Wheezing: Secondary | ICD-10-CM | POA: Diagnosis not present

## 2013-02-06 DIAGNOSIS — K219 Gastro-esophageal reflux disease without esophagitis: Secondary | ICD-10-CM | POA: Diagnosis not present

## 2013-02-06 DIAGNOSIS — R0602 Shortness of breath: Secondary | ICD-10-CM | POA: Diagnosis not present

## 2013-02-06 DIAGNOSIS — F17200 Nicotine dependence, unspecified, uncomplicated: Secondary | ICD-10-CM | POA: Insufficient documentation

## 2013-02-06 DIAGNOSIS — I10 Essential (primary) hypertension: Secondary | ICD-10-CM | POA: Diagnosis not present

## 2013-02-06 DIAGNOSIS — R059 Cough, unspecified: Secondary | ICD-10-CM | POA: Insufficient documentation

## 2013-02-06 DIAGNOSIS — R509 Fever, unspecified: Secondary | ICD-10-CM | POA: Insufficient documentation

## 2013-02-06 DIAGNOSIS — R197 Diarrhea, unspecified: Secondary | ICD-10-CM | POA: Diagnosis not present

## 2013-02-06 DIAGNOSIS — R51 Headache: Secondary | ICD-10-CM | POA: Insufficient documentation

## 2013-02-06 DIAGNOSIS — F3289 Other specified depressive episodes: Secondary | ICD-10-CM | POA: Insufficient documentation

## 2013-02-06 DIAGNOSIS — F411 Generalized anxiety disorder: Secondary | ICD-10-CM | POA: Insufficient documentation

## 2013-02-06 LAB — URINALYSIS, ROUTINE W REFLEX MICROSCOPIC
Glucose, UA: NEGATIVE mg/dL
Hgb urine dipstick: NEGATIVE
Ketones, ur: NEGATIVE mg/dL
Nitrite: NEGATIVE
Protein, ur: NEGATIVE mg/dL
Specific Gravity, Urine: 1.02 (ref 1.005–1.030)
Urobilinogen, UA: 2 mg/dL — ABNORMAL HIGH (ref 0.0–1.0)
pH: 6 (ref 5.0–8.0)

## 2013-02-06 LAB — POCT I-STAT TROPONIN I: Troponin i, poc: 0 ng/mL (ref 0.00–0.08)

## 2013-02-06 LAB — CBC
HCT: 36.6 % (ref 36.0–46.0)
Hemoglobin: 13 g/dL (ref 12.0–15.0)
MCH: 32.5 pg (ref 26.0–34.0)
MCHC: 35.5 g/dL (ref 30.0–36.0)
MCV: 91.5 fL (ref 78.0–100.0)
Platelets: 303 10*3/uL (ref 150–400)
RBC: 4 MIL/uL (ref 3.87–5.11)
RDW: 13.9 % (ref 11.5–15.5)
WBC: 9 10*3/uL (ref 4.0–10.5)

## 2013-02-06 LAB — BASIC METABOLIC PANEL
BUN: 7 mg/dL (ref 6–23)
CO2: 20 mEq/L (ref 19–32)
Calcium: 8.9 mg/dL (ref 8.4–10.5)
Chloride: 95 mEq/L — ABNORMAL LOW (ref 96–112)
Creatinine, Ser: 0.57 mg/dL (ref 0.50–1.10)
GFR calc Af Amer: >90 mL/min (ref >90)
GFR calc non Af Amer: >90 mL/min (ref >90)
Glucose, Bld: 83 mg/dL (ref 70–99)
Potassium: 3.5 mEq/L (ref 3.5–5.1)
Sodium: 132 mEq/L — ABNORMAL LOW (ref 135–145)

## 2013-02-06 LAB — URINE MICROSCOPIC-ADD ON

## 2013-02-06 MED ORDER — SODIUM CHLORIDE 0.9 % IV BOLUS (SEPSIS)
1000.0000 mL | Freq: Once | INTRAVENOUS | Status: AC
  Administered 2013-02-06: 1000 mL via INTRAVENOUS

## 2013-02-06 MED ORDER — ALBUTEROL SULFATE HFA 108 (90 BASE) MCG/ACT IN AERS
4.0000 | INHALATION_SPRAY | Freq: Once | RESPIRATORY_TRACT | Status: AC
  Administered 2013-02-06: 4 via RESPIRATORY_TRACT
  Filled 2013-02-06: qty 6.7

## 2013-02-06 MED ORDER — OXYCODONE-ACETAMINOPHEN 5-325 MG PO TABS
1.0000 | ORAL_TABLET | Freq: Once | ORAL | Status: AC
  Administered 2013-02-06: 1 via ORAL
  Filled 2013-02-06: qty 1

## 2013-02-06 MED ORDER — ALBUTEROL SULFATE HFA 108 (90 BASE) MCG/ACT IN AERS: 2.0000 | INHALATION_SPRAY | Freq: Four times a day (QID) | RESPIRATORY_TRACT | Status: DC | PRN

## 2013-02-06 MED ORDER — OXYCODONE-ACETAMINOPHEN 5-325 MG PO TABS: 1.0000 | ORAL_TABLET | ORAL | Status: DC | PRN

## 2013-02-06 MED ORDER — ALBUTEROL SULFATE (5 MG/ML) 0.5% IN NEBU
5.0000 mg | INHALATION_SOLUTION | Freq: Once | RESPIRATORY_TRACT | Status: AC
  Administered 2013-02-06: 5 mg via RESPIRATORY_TRACT
  Filled 2013-02-06: qty 1

## 2013-02-06 MED ORDER — IPRATROPIUM BROMIDE 0.02 % IN SOLN
0.5000 mg | Freq: Once | RESPIRATORY_TRACT | Status: AC
  Administered 2013-02-06: 0.5 mg via RESPIRATORY_TRACT
  Filled 2013-02-06: qty 2.5

## 2013-02-06 NOTE — ED Notes (Signed)
Pt states that since Monday she's had CP, SOB, cough, and fever 99 - 102 degrees.  Pt alert and oriented x 4.

## 2013-02-06 NOTE — ED Provider Notes (Signed)
CSN: [id number]     Arrival date & time 02/06/13  1328 History   First MD Initiated Contact with Patient 02/06/13 1616     Chief Complaint  Patient presents with  . Chest Pain  . Shortness of Breath  . Cough  . Fever   (Consider location/radiation/quality/duration/timing/severity/associated sxs/prior Treatment) HPI Ms. MANUELITA MOXON is a 52 y.o. female w/ PMHx of HTN, GERD, chronic lower back pain s/p multiple surgeries, depression, and anxiety, presents to the ED w/ complaints of SOB, cough, and sinus congestion since Monday. She says she has had rhinorrhea, ear fullness, and sinus headaches, as well as cough, productive of scant sputum, subjective fever at home, and associated SOB. She also claims that she has had a wheeze. The patient also claims to have recent weakness, dizziness, nausea and diarrhea as well. She has been taking OTC remedies for cold symptoms without relief.  Past Medical History  Diagnosis Date  . Anxiety   . Hypertension   . Chronic low back pain 1991    disk s/p 4 diskectomies, 5th fursion, then spinal cord stimulator (Cabbell)  . Bronchitis     2 weeks ago rx regime  . Depression   . GERD (gastroesophageal reflux disease)   . Arthritis   . Rotator cuff disorder    Past Surgical History  Procedure Laterality Date  . Spine surgery  7/ 12 last    5 previous  . Stimulator  12    spine,removed 7/12  . Cesarean section  86  . Lumbar fusion  04/27/2011   Family History  Problem Relation Age of Onset  . Stroke Father   . Hypertension Father    History  Substance Use Topics  . Smoking status: Current Every Day Smoker -- 1.00 packs/day for 30 years    Types: Cigarettes  . Smokeless tobacco: Never Used     Comment: prior trial of zyban made her feel weird   . Alcohol Use: No   OB History   Grav Para Term Preterm Abortions TAB SAB Ect Mult Living                 Review of Systems General: Positive for fever, chills, fatigue. Denies diaphoresis,  appetite change.  Respiratory: Positive for SOB, cough, chest tightness, and wheezing.  Cardiovascular: Denies chest pain, palpitations and leg swelling.  Gastrointestinal: Positive for nausea and diarrhea. Denies vomiting, abdominal pain, constipation, blood in stool and abdominal distention.  Genitourinary: Denies dysuria, urgency, frequency, hematuria, flank pain and difficulty urinating.  Endocrine: Denies hot or cold intolerance, sweats, polyuria, polydipsia. Musculoskeletal: Denies myalgias, back pain, joint swelling, arthralgias and gait problem.  Skin: Denies pallor, rash and wounds.  Neurological: Positive for weakness, lightheadedness, and headaches. Denies dizziness, seizures, syncope, numbness.  Psychiatric/Behavioral: Denies mood changes, confusion, nervousness, sleep disturbance and agitation.  Allergies  Review of patient's allergies indicates no known allergies.  Home Medications   Current Outpatient Rx  Name  Route  Sig  Dispense  Refill  . acetaminophen (TYLENOL) 500 MG tablet   Oral   Take 500 mg by mouth every 6 (six) hours as needed (for pain and fever).         Marland Kitchen FLUoxetine (PROZAC) 20 MG tablet   Oral   Take 20 mg by mouth daily.         Marland Kitchen gabapentin (NEURONTIN) 300 MG capsule   Oral   Take 300 mg by mouth 3 (three) times daily.         Marland Kitchen  lansoprazole (PREVACID) 30 MG capsule   Oral   Take 30 mg by mouth daily.          . promethazine (PHENERGAN) 25 MG tablet   Oral   Take 25 mg by mouth every 6 (six) hours as needed for nausea.         . valsartan-hydrochlorothiazide (DIOVAN-HCT) 160-12.5 MG per tablet   Oral   Take 1 tablet by mouth daily.          Physical Exam Filed Vitals:   02/06/13 1336  BP: 121/76  Pulse: 88  Temp: 98.8 F (37.1 C)  TempSrc: Oral  Resp: 24  Height: 5\' 7"  (1.702 m)  Weight: 188 lb (85.276 kg)  SpO2: 99%  General: Vital signs reviewed.  Patient is a well-developed and well-nourished, in no acute distress  and cooperative with exam. Alert and oriented x3.  Head: Normocephalic and atraumatic. Eyes: PERRL, EOMI, conjunctivae normal, No scleral icterus.  Neck: Supple, trachea midline, normal ROM, No JVD, masses, thyromegaly, or carotid bruit present.  Cardiovascular: RRR, S1 normal, S2 normal, no murmurs, gallops, or rubs. Pulmonary/Chest: Normal respiratory effort, expiratory wheeze present in all lung fields.  Abdominal: Soft, non-tender, non-distended, bowel sounds are normal, no masses, organomegaly, or guarding present.  Musculoskeletal: No joint deformities, erythema, or stiffness, ROM full and no nontender. 2 midline scars on the back from previous surgeries.  Extremities: No swelling or edema,  pulses symmetric and intact bilaterally. No cyanosis or clubbing. Neurological: A&O x3, Strength is normal and symmetric bilaterally, cranial nerve II-XII are grossly intact, no focal motor deficit, sensory intact to light touch bilaterally.  Skin: Warm, dry and intact. No rashes or erythema. Psychiatric: Normal mood and affect. speech and behavior is normal. Cognition and memory are normal.   ED Course  Procedures (including critical care time) Labs Review Labs Reviewed  BASIC METABOLIC PANEL - Abnormal; Notable for the following:    Sodium 132 (*)    Chloride 95 (*)    All other components within normal limits  URINALYSIS, ROUTINE W REFLEX MICROSCOPIC - Abnormal; Notable for the following:    APPearance HAZY (*)    Bilirubin Urine SMALL (*)    Urobilinogen, UA 2.0 (*)    Leukocytes, UA SMALL (*)    All other components within normal limits  URINE MICROSCOPIC-ADD ON - Abnormal; Notable for the following:    Squamous Epithelial / LPF MANY (*)    Bacteria, UA FEW (*)    All other components within normal limits  URINE CULTURE  CBC  POCT I-STAT TROPONIN I   Imaging Review Dg Chest 2 View  02/06/2013   CLINICAL DATA:  Chest pain, cough, fever, shortness of Breath  EXAM: CHEST  2 VIEW   COMPARISON:  05/06/2012  FINDINGS: Cardiomediastinal silhouette is stable. No acute infiltrate or pulmonary edema. Mild hyperinflation and chronic interstitial prominence. Mild degenerative changes thoracic spine.  IMPRESSION: No active disease. Mild hyperinflation and chronic interstitial prominence.   Electronically Signed   By: Natasha Mead M.D.   On: 02/06/2013 17:24    EKG Interpretation    Date/Time:  Wednesday February 06 2013 13:33:11 EST Ventricular Rate:  89 PR Interval:  152 QRS Duration: 76 QT Interval:  374 QTC Calculation: 455 R Axis:   -8 Text Interpretation:  Normal sinus rhythm Minimal voltage criteria for LVH, may be normal variant Borderline ECG No significant change was found Confirmed by [facility]  MD, TREY ([id number]) on 02/06/2013 5:09:27 PM  MDM   Ms. KADAJAH KJOS is a 52 y.o. female w/ PMHx of HTN, GERD, chronic lower back pain s/p multiple surgeries, depression, and anxiety, presents to the ED w/ complaints of URI symptoms. SpO2 99% in triage.  -EKG shows NSR w/ no dynamic ST changes -CBC wnl, no leukocytosis -BMET w/ mild hyponatremia (132), otherwise wnl -Troponin -ve -CXR shows no active disease w/ mild hyperinflation and chronic interstitial prominence. -Give albuterol/atrovent nebulizer w/ improvement of respiratory symptoms. -Percocet 5-325 mg po for back pain -Pulse ox w/ ambulation shows no significant decrease in SpO2. -NS 1L bolus -UA w/ many squames, but not suggestive of UTI.   Patient stable for discharge. Will give albuterol inhaler + percocet 5-325 #4 as patient claims she no longer has any for her back pain. She will follow up with PCP in the next 1-2 weeks and should have PFT's performed at that time.   Courtney Paris, MD 02/06/13 [phone]

## 2013-02-06 NOTE — ED Provider Notes (Signed)
I saw and evaluated the patient, reviewed the resident's note and I agree with the findings and plan.  EKG Interpretation    Date/Time:  Wednesday February 06 2013 13:33:11 EST Ventricular Rate:  89 PR Interval:  152 QRS Duration: 76 QT Interval:  374 QTC Calculation: 455 R Axis:   -8 Text Interpretation:  Normal sinus rhythm Minimal voltage criteria for LVH, may be normal variant Borderline ECG No significant change was found Confirmed by [facility]  MD, TREY ([id number]) on 02/06/2013 5:09:27 PM            52 yo female smoker with 3 days of URI symptoms, cough.  Worse two days ago, and has been gradually improving since.  She was convinced by a friend to come t o the ED.  On exam, no respiratory distress, nontoxic, mild bilateral wheezing present.  CXR without infiltrate.  Better after nebulizer.  Plan DC and supportive treatment/albuterol.  Clinical Impression: 1. Shortness of breath       Candyce Churn, MD 02/06/13 [phone]

## 2013-02-06 NOTE — ED Notes (Signed)
Pt ambulates without distress.  

## 2013-02-06 NOTE — ED Notes (Signed)
Pt o2 Saturation drops to 90%  When ambulating. MD informed

## 2013-02-08 LAB — URINE CULTURE
Colony Count: NO GROWTH
Culture: NO GROWTH

## 2013-02-08 NOTE — ED Provider Notes (Signed)
I saw and evaluated the patient, reviewed the resident's note and I agree with the findings and plan.  EKG Interpretation    Date/Time:  Wednesday February 06 2013 13:33:11 EST Ventricular Rate:  89 PR Interval:  152 QRS Duration: 76 QT Interval:  374 QTC Calculation: 455 R Axis:   -8 Text Interpretation:  Normal sinus rhythm Minimal voltage criteria for LVH, may be normal variant Borderline ECG No significant change was found Confirmed by [facility]  MD, TREY ([id number]) on 02/06/2013 5:09:27 PM              Candyce Churn, MD 02/08/13 [phone]

## 2013-02-13 ENCOUNTER — Emergency Department (HOSPITAL_COMMUNITY)
Admission: EM | Admit: 2013-02-13 | Discharge: 2013-02-13 | Disposition: A | Discharge status: Home / Self Care | Payer: Medicare Other | Attending: Emergency Medicine | Admitting: Emergency Medicine

## 2013-02-13 ENCOUNTER — Encounter (HOSPITAL_COMMUNITY): Payer: Self-pay | Admitting: Emergency Medicine

## 2013-02-13 DIAGNOSIS — M62838 Other muscle spasm: Secondary | ICD-10-CM

## 2013-02-13 DIAGNOSIS — G8929 Other chronic pain: Secondary | ICD-10-CM | POA: Diagnosis not present

## 2013-02-13 DIAGNOSIS — K219 Gastro-esophageal reflux disease without esophagitis: Secondary | ICD-10-CM | POA: Insufficient documentation

## 2013-02-13 DIAGNOSIS — F411 Generalized anxiety disorder: Secondary | ICD-10-CM | POA: Diagnosis not present

## 2013-02-13 DIAGNOSIS — M129 Arthropathy, unspecified: Secondary | ICD-10-CM | POA: Diagnosis not present

## 2013-02-13 DIAGNOSIS — I10 Essential (primary) hypertension: Secondary | ICD-10-CM | POA: Insufficient documentation

## 2013-02-13 DIAGNOSIS — Z79899 Other long term (current) drug therapy: Secondary | ICD-10-CM | POA: Insufficient documentation

## 2013-02-13 DIAGNOSIS — F329 Major depressive disorder, single episode, unspecified: Secondary | ICD-10-CM | POA: Insufficient documentation

## 2013-02-13 DIAGNOSIS — M546 Pain in thoracic spine: Secondary | ICD-10-CM | POA: Diagnosis not present

## 2013-02-13 DIAGNOSIS — M549 Dorsalgia, unspecified: Secondary | ICD-10-CM

## 2013-02-13 DIAGNOSIS — F3289 Other specified depressive episodes: Secondary | ICD-10-CM | POA: Insufficient documentation

## 2013-02-13 DIAGNOSIS — F17200 Nicotine dependence, unspecified, uncomplicated: Secondary | ICD-10-CM | POA: Insufficient documentation

## 2013-02-13 MED ORDER — OXYCODONE-ACETAMINOPHEN 5-325 MG PO TABS: 1.0000 | ORAL_TABLET | Freq: Four times a day (QID) | ORAL | Status: DC | PRN

## 2013-02-13 MED ORDER — LORAZEPAM 0.5 MG PO TABS: 0.5000 mg | ORAL_TABLET | Freq: Three times a day (TID) | ORAL | Status: DC | PRN

## 2013-02-13 MED ORDER — OXYCODONE-ACETAMINOPHEN 5-325 MG PO TABS
2.0000 | ORAL_TABLET | Freq: Once | ORAL | Status: AC
  Administered 2013-02-13: 2 via ORAL
  Filled 2013-02-13: qty 2

## 2013-02-13 NOTE — ED Notes (Signed)
Reports being seen here recently for same, had a near fall and mid back pain since. Hx of back surgery. Ambulatory at triage.

## 2013-02-13 NOTE — ED Provider Notes (Signed)
CSN: [id number]     Arrival date & time 02/13/13  0703 History   First MD Initiated Contact with Patient 02/13/13 [phone]     Chief Complaint  Patient presents with  . Back Pain   (Consider location/radiation/quality/duration/timing/severity/associated sxs/prior Treatment) Patient is a 52 y.o. female presenting with back pain. The history is provided by the patient.  Back Pain Location:  Thoracic spine Quality:  Aching and cramping Radiates to:  Does not radiate Pain severity:  Severe Pain is:  Same all the time Onset quality:  Gradual Duration:  1 week Timing:  Constant Progression:  Unchanged Chronicity:  New Context: falling   Relieved by:  Being still, narcotics and muscle relaxants Worsened by:  Bending and twisting Ineffective treatments:  Heating pad and NSAIDs Associated symptoms: no abdominal pain, no chest pain and no fever     Past Medical History  Diagnosis Date  . Anxiety   . Hypertension   . Chronic low back pain 1991    disk s/p 4 diskectomies, 5th fursion, then spinal cord stimulator ([facility])  . Bronchitis     2 weeks ago rx regime  . Depression   . GERD (gastroesophageal reflux disease)   . Arthritis   . Rotator cuff disorder    Past Surgical History  Procedure Laterality Date  . Spine surgery  7/ 12 last    5 previous  . Stimulator  12    spine,removed 7/12  . Cesarean section  86  . Lumbar fusion  04/27/2011   Family History  Problem Relation Age of Onset  . Stroke Father   . Hypertension Father    History  Substance Use Topics  . Smoking status: Current Every Day Smoker -- 1.00 packs/day for 30 years    Types: Cigarettes  . Smokeless tobacco: Never Used     Comment: prior trial of zyban made her feel weird   . Alcohol Use: No   OB History   Grav Para Term Preterm Abortions TAB SAB Ect Mult Living                 Review of Systems  Constitutional: Negative for fever.  Respiratory: Negative for cough and shortness of breath.    Cardiovascular: Negative for chest pain and leg swelling.  Gastrointestinal: Negative for vomiting and abdominal pain.  Musculoskeletal: Positive for back pain.  All other systems reviewed and are negative.    Allergies  Review of patient's allergies indicates no known allergies.  Home Medications   Current Outpatient Rx  Name  Route  Sig  Dispense  Refill  . acetaminophen (TYLENOL) 500 MG tablet   Oral   Take 500 mg by mouth every 6 (six) hours as needed (for pain and fever).         Marland Kitchen albuterol (PROVENTIL HFA;VENTOLIN HFA) 108 (90 BASE) MCG/ACT inhaler   Inhalation   Inhale 2 puffs into the lungs every 6 (six) hours as needed for wheezing or shortness of breath.   1 Inhaler   2   . FLUoxetine (PROZAC) 20 MG tablet   Oral   Take 20 mg by mouth daily.         Marland Kitchen gabapentin (NEURONTIN) 300 MG capsule   Oral   Take 300 mg by mouth 3 (three) times daily.         . lansoprazole (PREVACID) 30 MG capsule   Oral   Take 30 mg by mouth daily.          Marland Kitchen  LORazepam (ATIVAN) 0.5 MG tablet   Oral   Take 1 tablet (0.5 mg total) by mouth every 8 (eight) hours as needed for anxiety (muscle spasm).   12 tablet   0   . oxyCODONE-acetaminophen (PERCOCET/ROXICET) 5-325 MG per tablet   Oral   Take 1 tablet by mouth every 6 (six) hours as needed for severe pain.   10 tablet   0   . oxyCODONE-acetaminophen (ROXICET) 5-325 MG per tablet   Oral   Take 1 tablet by mouth every 4 (four) hours as needed for severe pain.   4 tablet   0   . promethazine (PHENERGAN) 25 MG tablet   Oral   Take 25 mg by mouth every 6 (six) hours as needed for nausea.         . valsartan-hydrochlorothiazide (DIOVAN-HCT) 160-12.5 MG per tablet   Oral   Take 1 tablet by mouth daily.          BP 167/77  Pulse 114  Temp(Src) 98.7 F (37.1 C) (Oral)  Resp 18  Wt 185 lb (83.915 kg)  SpO2 100% Physical Exam  Nursing note and vitals reviewed. Constitutional: She is oriented to person, place,  and time. She appears well-developed and well-nourished. No distress.  HENT:  Head: Normocephalic and atraumatic.  Eyes: EOM are normal. Pupils are equal, round, and reactive to light.  Neck: Normal range of motion. Neck supple.  Cardiovascular: Normal rate and regular rhythm.  Exam reveals no friction rub.   No murmur heard. Pulmonary/Chest: Effort normal and breath sounds normal. No respiratory distress. She has no wheezes. She has no rales.  Abdominal: Soft. She exhibits no distension. There is no tenderness. There is no rebound.  Musculoskeletal: Normal range of motion. She exhibits no edema.       Cervical back: She exhibits normal range of motion, no tenderness and no bony tenderness.       Thoracic back: She exhibits tenderness (bilateral paraspinal muscles, lower trapezius), pain and spasm. She exhibits no bony tenderness.  Neurological: She is alert and oriented to person, place, and time. No cranial nerve deficit. She exhibits normal muscle tone. Coordination and gait normal. GCS eye subscore is 4. GCS verbal subscore is 5. GCS motor subscore is 6.  Skin: She is not diaphoretic.    ED Course  Procedures (including critical care time) Labs Review Labs Reviewed - No data to display Imaging Review No results found.  EKG Interpretation   None       MDM   1. Muscle spasm   2. Back pain    17F here with back pain. Seen here for similar previously. Feels like she tweaked her back while stopping a fall walking down steps. Difficulty with moving around, deep breaths. Normal appetitite, no fevers, no V/D, normal gait. Afebrile, mild tachycardia, likely secondary to pain, normal BP.  On exam, mid back tenderness to palpation with spasm in mid thoracic back. Will give muscle relaxers, pain meds. Instructed to f/u with PT and PCP for this.    Dagmar Hait, MD 02/13/13 [phone]

## 2013-02-13 NOTE — ED Notes (Signed)
[place], MD at bedside. 

## 2013-02-13 NOTE — ED Notes (Signed)
Reports almost fell & injured upper-mid back when she broke her fall 1 week ago. States pain not improving & ran out of pain meds. Seen here for same week ago.

## 2013-02-26 DIAGNOSIS — F331 Major depressive disorder, recurrent, moderate: Secondary | ICD-10-CM | POA: Diagnosis not present

## 2013-02-28 ENCOUNTER — Emergency Department (HOSPITAL_COMMUNITY)
Admission: EM | Admit: 2013-02-28 | Discharge: 2013-02-28 | Disposition: A | Discharge status: Home / Self Care | Payer: Medicare Other | Attending: Emergency Medicine | Admitting: Emergency Medicine

## 2013-02-28 ENCOUNTER — Encounter (HOSPITAL_COMMUNITY): Payer: Self-pay | Admitting: Emergency Medicine

## 2013-02-28 DIAGNOSIS — F329 Major depressive disorder, single episode, unspecified: Secondary | ICD-10-CM | POA: Diagnosis not present

## 2013-02-28 DIAGNOSIS — K089 Disorder of teeth and supporting structures, unspecified: Secondary | ICD-10-CM | POA: Diagnosis not present

## 2013-02-28 DIAGNOSIS — K219 Gastro-esophageal reflux disease without esophagitis: Secondary | ICD-10-CM | POA: Diagnosis not present

## 2013-02-28 DIAGNOSIS — F3289 Other specified depressive episodes: Secondary | ICD-10-CM | POA: Diagnosis not present

## 2013-02-28 DIAGNOSIS — I10 Essential (primary) hypertension: Secondary | ICD-10-CM | POA: Insufficient documentation

## 2013-02-28 DIAGNOSIS — M545 Low back pain, unspecified: Secondary | ICD-10-CM | POA: Insufficient documentation

## 2013-02-28 DIAGNOSIS — K0889 Other specified disorders of teeth and supporting structures: Secondary | ICD-10-CM

## 2013-02-28 DIAGNOSIS — Z79899 Other long term (current) drug therapy: Secondary | ICD-10-CM | POA: Insufficient documentation

## 2013-02-28 DIAGNOSIS — G8929 Other chronic pain: Secondary | ICD-10-CM | POA: Diagnosis not present

## 2013-02-28 DIAGNOSIS — Z8739 Personal history of other diseases of the musculoskeletal system and connective tissue: Secondary | ICD-10-CM | POA: Diagnosis not present

## 2013-02-28 DIAGNOSIS — M549 Dorsalgia, unspecified: Secondary | ICD-10-CM

## 2013-02-28 DIAGNOSIS — F17200 Nicotine dependence, unspecified, uncomplicated: Secondary | ICD-10-CM | POA: Diagnosis not present

## 2013-02-28 DIAGNOSIS — Z8709 Personal history of other diseases of the respiratory system: Secondary | ICD-10-CM | POA: Insufficient documentation

## 2013-02-28 MED ORDER — OXYCODONE-ACETAMINOPHEN 5-325 MG PO TABS
2.0000 | ORAL_TABLET | Freq: Once | ORAL | Status: AC
  Administered 2013-02-28: 2 via ORAL
  Filled 2013-02-28: qty 2

## 2013-02-28 MED ORDER — PENICILLIN V POTASSIUM 500 MG PO TABS: 500.0000 mg | ORAL_TABLET | Freq: Four times a day (QID) | ORAL | Status: DC

## 2013-02-28 MED ORDER — OXYCODONE-ACETAMINOPHEN 5-325 MG PO TABS: 2.0000 | ORAL_TABLET | ORAL | Status: DC | PRN

## 2013-02-28 MED ORDER — DIAZEPAM 5 MG PO TABS: 5.0000 mg | ORAL_TABLET | Freq: Two times a day (BID) | ORAL | Status: DC

## 2013-02-28 NOTE — ED Notes (Signed)
Pt to ED c/o blister above partial.  States partial was placed in April.  Pt also c/o back pain (hx of chronic pain).

## 2013-02-28 NOTE — ED Provider Notes (Signed)
CSN: [id number]     Arrival date & time 02/28/13  1442 History  This chart was scribed for non-physician practitioner Alvina Chou, PA-C working with Ephraim Hamburger, MD by Rolanda Lundborg, ED Scribe. This patient was seen in room TR09C/TR09C and the patient's care was started at 3:11 PM.   Chief Complaint  Patient presents with  . Back Pain  . Dental Pain   The history is provided by the patient. No language interpreter was used.   HPI Comments: Madeline Dickerson is a 53 y.o. female who presents to the Emergency Department complaining of having a blister above partial denture onset one month ago with associated pain. She states she popped the blister a few days ago and has seen some drainage over the last few days. She reports being unable to eat because of the pain. She reports she went to her dentist today but there was a sign on the door that they are closed til Monday.   Pt with h/o chronic back pain also c/o back pain. Pt was seen 12/19 and 12/24 for the same. She states she was walking down the stairs, missed a step, and caught herself on the railing causing her back to catch. She has been having pain since. She reports she has not been able to get in to see her regular doctor because of the holidays. She states Valium has helped her back pain in the past. She states she scheduled an appointment with neurosurgeon Dr Suella Broad but they called to cancel because they saw she had not paid a previous health care bill. She states she was previously in pain management for a few years but not currently.   Past Medical History  Diagnosis Date  . Anxiety   . Hypertension   . Chronic low back pain 1991    disk s/p 4 diskectomies, 5th fursion, then spinal cord stimulator ([facility])  . Bronchitis     2 weeks ago rx regime  . Depression   . GERD (gastroesophageal reflux disease)   . Arthritis   . Rotator cuff disorder    Past Surgical History  Procedure Laterality Date  . Spine surgery  7/  12 last    5 previous  . Stimulator  12    spine,removed 7/12  . Cesarean section  84  . Lumbar fusion  04/27/2011   Family History  Problem Relation Age of Onset  . Stroke Father   . Hypertension Father    History  Substance Use Topics  . Smoking status: Current Every Day Smoker -- 1.00 packs/day for 30 years    Types: Cigarettes  . Smokeless tobacco: Never Used     Comment: prior trial of zyban made her feel weird   . Alcohol Use: No   OB History   Grav Para Term Preterm Abortions TAB SAB Ect Mult Living                 Review of Systems  HENT: Positive for dental problem.   Musculoskeletal: Positive for back pain.  All other systems reviewed and are negative.    Allergies  Review of patient's allergies indicates no known allergies.  Home Medications   Current Outpatient Rx  Name  Route  Sig  Dispense  Refill  . albuterol (PROVENTIL HFA;VENTOLIN HFA) 108 (90 BASE) MCG/ACT inhaler   Inhalation   Inhale 2 puffs into the lungs every 6 (six) hours as needed for wheezing or shortness of breath.   1 Inhaler  2   . FLUoxetine (PROZAC) 20 MG tablet   Oral   Take 60 mg by mouth daily.          Marland Kitchen gabapentin (NEURONTIN) 400 MG capsule   Oral   Take 400 mg by mouth 4 (four) times daily.         . lansoprazole (PREVACID) 30 MG capsule   Oral   Take 30 mg by mouth daily.          . promethazine (PHENERGAN) 25 MG tablet   Oral   Take 25 mg by mouth daily as needed for nausea.          . valsartan-hydrochlorothiazide (DIOVAN-HCT) 160-12.5 MG per tablet   Oral   Take 1 tablet by mouth daily.          BP 166/105  Pulse 92  Temp(Src) 97.9 F (36.6 C) (Oral)  Resp 18  SpO2 98% Physical Exam  Nursing note and vitals reviewed. Constitutional: She is oriented to person, place, and time. She appears well-developed and well-nourished. No distress.  HENT:  Head: Normocephalic and atraumatic.  Partial on the top, no blister noted  Eyes: EOM are normal.   Neck: Neck supple. No tracheal deviation present.  Cardiovascular: Normal rate.   Pulmonary/Chest: Effort normal. No respiratory distress.  Musculoskeletal: Normal range of motion.  Generalized paraspinal lumbar tenderness to palpation. No midline spine tenderness to palpation.   Neurological: She is alert and oriented to person, place, and time.  Skin: Skin is warm and dry.  Psychiatric: She has a normal mood and affect. Her behavior is normal.    ED Course  Procedures (including critical care time) Medications  oxyCODONE-acetaminophen (PERCOCET/ROXICET) 5-325 MG per tablet 2 tablet (2 tablets Oral Given 02/28/13 1604)    DIAGNOSTIC STUDIES: Oxygen Saturation is 98% on RA, normal by my interpretation.    COORDINATION OF CARE: 4:41 PM- Discussed treatment plan with pt which includes discharge home with Valium, percocet, and antibiotics. Pt agrees to plan.    Labs Review Labs Reviewed - No data to display Imaging Review No results found.  EKG Interpretation   None       MDM   1. Pain, dental   2. Chronic back pain    4:49 PM Patient has chronic back pain and dental pain. Patient instructed to follow up with PCP for further evaluation. Patient will have Penicillin, percocet and valium for her symptoms. Patient has a dentist that she will follow up with. Vitals stable and patient afebrile.   I personally performed the services described in this documentation, which was scribed in my presence. The recorded information has been reviewed and is accurate.   Alvina Chou, PA-C 02/28/13 1651

## 2013-02-28 NOTE — ED Provider Notes (Signed)
Medical screening examination/treatment/procedure(s) were performed by non-physician practitioner and as supervising physician I was immediately available for consultation/collaboration.  EKG Interpretation   None         Ephraim Hamburger, MD 02/28/13 1740

## 2013-02-28 NOTE — Discharge Instructions (Signed)
Take penicillin as directed until gone. Take Percocet as needed for pain. Take valium as needed for back pain. Refer to attached documents for more information.

## 2013-03-07 ENCOUNTER — Emergency Department (HOSPITAL_COMMUNITY): Payer: Medicare Other

## 2013-03-07 ENCOUNTER — Emergency Department (HOSPITAL_COMMUNITY)
Admission: EM | Admit: 2013-03-07 | Discharge: 2013-03-07 | Disposition: A | Discharge status: Home / Self Care | Payer: Medicare Other | Attending: Emergency Medicine | Admitting: Emergency Medicine

## 2013-03-07 ENCOUNTER — Encounter (HOSPITAL_COMMUNITY): Payer: Self-pay | Admitting: Emergency Medicine

## 2013-03-07 DIAGNOSIS — Z79899 Other long term (current) drug therapy: Secondary | ICD-10-CM | POA: Diagnosis not present

## 2013-03-07 DIAGNOSIS — M129 Arthropathy, unspecified: Secondary | ICD-10-CM | POA: Insufficient documentation

## 2013-03-07 DIAGNOSIS — J40 Bronchitis, not specified as acute or chronic: Secondary | ICD-10-CM | POA: Diagnosis not present

## 2013-03-07 DIAGNOSIS — J189 Pneumonia, unspecified organism: Secondary | ICD-10-CM | POA: Insufficient documentation

## 2013-03-07 DIAGNOSIS — F329 Major depressive disorder, single episode, unspecified: Secondary | ICD-10-CM | POA: Diagnosis not present

## 2013-03-07 DIAGNOSIS — R059 Cough, unspecified: Secondary | ICD-10-CM | POA: Insufficient documentation

## 2013-03-07 DIAGNOSIS — M545 Low back pain, unspecified: Secondary | ICD-10-CM | POA: Diagnosis not present

## 2013-03-07 DIAGNOSIS — R079 Chest pain, unspecified: Secondary | ICD-10-CM | POA: Diagnosis not present

## 2013-03-07 DIAGNOSIS — M67919 Unspecified disorder of synovium and tendon, unspecified shoulder: Secondary | ICD-10-CM | POA: Diagnosis not present

## 2013-03-07 DIAGNOSIS — G8929 Other chronic pain: Secondary | ICD-10-CM | POA: Diagnosis not present

## 2013-03-07 DIAGNOSIS — F411 Generalized anxiety disorder: Secondary | ICD-10-CM | POA: Insufficient documentation

## 2013-03-07 DIAGNOSIS — K219 Gastro-esophageal reflux disease without esophagitis: Secondary | ICD-10-CM | POA: Insufficient documentation

## 2013-03-07 DIAGNOSIS — I10 Essential (primary) hypertension: Secondary | ICD-10-CM | POA: Diagnosis not present

## 2013-03-07 DIAGNOSIS — M719 Bursopathy, unspecified: Secondary | ICD-10-CM | POA: Insufficient documentation

## 2013-03-07 DIAGNOSIS — F17200 Nicotine dependence, unspecified, uncomplicated: Secondary | ICD-10-CM | POA: Diagnosis not present

## 2013-03-07 DIAGNOSIS — F3289 Other specified depressive episodes: Secondary | ICD-10-CM | POA: Diagnosis not present

## 2013-03-07 DIAGNOSIS — R05 Cough: Secondary | ICD-10-CM | POA: Insufficient documentation

## 2013-03-07 LAB — CBC
HCT: 35.5 % — ABNORMAL LOW (ref 36.0–46.0)
Hemoglobin: 8.3 g/dL — ABNORMAL LOW (ref 12.0–15.0)
MCH: 21.9 pg — ABNORMAL LOW (ref 26.0–34.0)
MCHC: 23.4 g/dL — ABNORMAL LOW (ref 30.0–36.0)
MCV: 93.7 fL (ref 78.0–100.0)
Platelets: 288 10*3/uL (ref 150–400)
RBC: 3.79 MIL/uL — ABNORMAL LOW (ref 3.87–5.11)
RDW: 14.9 % (ref 11.5–15.5)
WBC: 12.8 10*3/uL — ABNORMAL HIGH (ref 4.0–10.5)

## 2013-03-07 LAB — BASIC METABOLIC PANEL
BUN: 9 mg/dL (ref 6–23)
CO2: 23 mEq/L (ref 19–32)
Calcium: 9.7 mg/dL (ref 8.4–10.5)
Chloride: 97 mEq/L (ref 96–112)
Creatinine, Ser: 0.62 mg/dL (ref 0.50–1.10)
GFR calc Af Amer: >90 mL/min (ref >90)
GFR calc non Af Amer: >90 mL/min (ref >90)
Glucose, Bld: 105 mg/dL — ABNORMAL HIGH (ref 70–99)
Potassium: 4.1 mEq/L (ref 3.7–5.3)
Sodium: 136 mEq/L — ABNORMAL LOW (ref 137–147)

## 2013-03-07 LAB — POCT I-STAT TROPONIN I: Troponin i, poc: 0 ng/mL (ref 0.00–0.08)

## 2013-03-07 LAB — HEMOGLOBIN AND HEMATOCRIT, BLOOD
HCT: 36.6 % (ref 36.0–46.0)
Hemoglobin: 12.5 g/dL (ref 12.0–15.0)

## 2013-03-07 MED ORDER — OXYCODONE-ACETAMINOPHEN 5-325 MG PO TABS
1.0000 | ORAL_TABLET | Freq: Once | ORAL | Status: AC
  Administered 2013-03-07: 1 via ORAL
  Filled 2013-03-07: qty 1

## 2013-03-07 MED ORDER — AZITHROMYCIN 250 MG PO TABS: 250.0000 mg | ORAL_TABLET | Freq: Every day | ORAL | Status: DC

## 2013-03-07 MED ORDER — OXYCODONE-ACETAMINOPHEN 5-325 MG PO TABS: 1.0000 | ORAL_TABLET | Freq: Four times a day (QID) | ORAL | Status: DC | PRN

## 2013-03-07 MED ORDER — AZITHROMYCIN 250 MG PO TABS
500.0000 mg | ORAL_TABLET | Freq: Once | ORAL | Status: AC
  Administered 2013-03-07: 500 mg via ORAL
  Filled 2013-03-07: qty 2

## 2013-03-07 NOTE — ED Notes (Signed)
Pt report chest pain and cold symptoms x 3 days. Mid chest pain occurs with productive cough and when taking deep breaths. Airway is intact. No acute distress noted, ekg done at triage.

## 2013-03-07 NOTE — ED Notes (Addendum)
Made 2 attempts to collect H&H. Unsuccessful. Called Phlebotomy

## 2013-03-07 NOTE — Discharge Instructions (Signed)
Take antibiotic and pain medicine as directed. Chest x-ray showed early pneumonia. Return if not improving in 2 days or if worse. Make an appointment to followup with your regular Dr.

## 2013-03-07 NOTE — ED Provider Notes (Signed)
CSN: [id number]     Arrival date & time 03/07/13  1014 History   First MD Initiated Contact with Patient 03/07/13 1335     Chief Complaint  Patient presents with  . Chest Pain  . Cough   (Consider location/radiation/quality/duration/timing/severity/associated sxs/prior Treatment) Patient is a 53 y.o. female presenting with chest pain and cough. The history is provided by the patient.  Chest Pain Associated symptoms: cough and headache   Associated symptoms: no fever   Cough Associated symptoms: chest pain, ear pain, headaches and myalgias   Associated symptoms: no fever and no rash    patient presents with cold symptoms for 3 days. Some chest discomfort with cough only. Productive cough. Patient also with the some diarrhea today. Patient denies fever. Patient does have a primary care Dr.  Past Medical History  Diagnosis Date  . Anxiety   . Hypertension   . Chronic low back pain 1991    disk s/p 4 diskectomies, 5th fursion, then spinal cord stimulator ([facility])  . Bronchitis     2 weeks ago rx regime  . Depression   . GERD (gastroesophageal reflux disease)   . Arthritis   . Rotator cuff disorder    Past Surgical History  Procedure Laterality Date  . Spine surgery  7/ 12 last    5 previous  . Stimulator  12    spine,removed 7/12  . Cesarean section  86  . Lumbar fusion  04/27/2011   Family History  Problem Relation Age of Onset  . Stroke Father   . Hypertension Father    History  Substance Use Topics  . Smoking status: Current Every Day Smoker -- 1.00 packs/day for 30 years    Types: Cigarettes  . Smokeless tobacco: Never Used     Comment: prior trial of zyban made her feel weird   . Alcohol Use: No   OB History   Grav Para Term Preterm Abortions TAB SAB Ect Mult Living                 Review of Systems  Constitutional: Negative for fever.  HENT: Positive for congestion and ear pain.   Eyes: Negative for redness.  Respiratory: Positive for cough.    Cardiovascular: Positive for chest pain.  Gastrointestinal: Positive for diarrhea.  Genitourinary: Negative for dysuria.  Musculoskeletal: Positive for myalgias.  Skin: Negative for rash.  Neurological: Positive for headaches.  Hematological: Does not bruise/bleed easily.  Psychiatric/Behavioral: Negative for confusion.    Allergies  Review of patient's allergies indicates no known allergies.  Home Medications   Current Outpatient Rx  Name  Route  Sig  Dispense  Refill  . albuterol (PROVENTIL HFA;VENTOLIN HFA) 108 (90 BASE) MCG/ACT inhaler   Inhalation   Inhale 2 puffs into the lungs every 6 (six) hours as needed for wheezing or shortness of breath.   1 Inhaler   2   . diazepam (VALIUM) 5 MG tablet   Oral   Take 1 tablet (5 mg total) by mouth 2 (two) times daily.   10 tablet   0   . FLUoxetine (PROZAC) 20 MG tablet   Oral   Take 60 mg by mouth daily.          Marland Kitchen gabapentin (NEURONTIN) 400 MG capsule   Oral   Take 400 mg by mouth 4 (four) times daily.         . lansoprazole (PREVACID) 30 MG capsule   Oral   Take 30 mg by mouth  daily.          . oxyCODONE-acetaminophen (PERCOCET/ROXICET) 5-325 MG per tablet   Oral   Take 2 tablets by mouth every 4 (four) hours as needed for severe pain.   12 tablet   0   . promethazine (PHENERGAN) 25 MG tablet   Oral   Take 25 mg by mouth daily as needed for nausea.          . valsartan-hydrochlorothiazide (DIOVAN-HCT) 160-12.5 MG per tablet   Oral   Take 1 tablet by mouth daily.         Marland Kitchen azithromycin (ZITHROMAX) 250 MG tablet   Oral   Take 1 tablet (250 mg total) by mouth daily. Take first 2 tablets together, then 1 every day until finished.   6 tablet   0   . oxyCODONE-acetaminophen (PERCOCET/ROXICET) 5-325 MG per tablet   Oral   Take 1-2 tablets by mouth every 6 (six) hours as needed for severe pain.   15 tablet   0   . penicillin v potassium (VEETID) 500 MG tablet   Oral   Take 1 tablet (500 mg total)  by mouth 4 (four) times daily.   40 tablet   0    BP 126/114  Pulse 87  Temp(Src) 97.8 F (36.6 C) (Oral)  Resp 24  Ht 5\' 7"  (1.702 m)  Wt 185 lb (83.915 kg)  BMI 28.97 kg/m2  SpO2 96% Physical Exam  Nursing note and vitals reviewed. Constitutional: She is oriented to person, place, and time. She appears well-developed and well-nourished. No distress.  HENT:  Head: Normocephalic and atraumatic.  Right Ear: External ear normal.  Left Ear: External ear normal.  Mouth/Throat: Oropharynx is clear and moist.  Eyes: Conjunctivae and EOM are normal. Pupils are equal, round, and reactive to light.  Neck: Normal range of motion.  Cardiovascular: Normal rate, regular rhythm and normal heart sounds.   No murmur heard. Pulmonary/Chest: Breath sounds normal. No respiratory distress.  Abdominal: Soft. Bowel sounds are normal. There is no tenderness.  Musculoskeletal: Normal range of motion.  Neurological: She is alert and oriented to person, place, and time. No cranial nerve deficit. She exhibits normal muscle tone. Coordination normal.  Skin: Skin is warm. No rash noted.    ED Course  Procedures (including critical care time) Labs Review Labs Reviewed  BASIC METABOLIC PANEL - Abnormal; Notable for the following:    Sodium 136 (*)    Glucose, Bld 105 (*)    All other components within normal limits  CBC - Abnormal; Notable for the following:    WBC 12.8 (*)    RBC 3.79 (*)    Hemoglobin 8.3 (*)    HCT 35.5 (*)    MCH 21.9 (*)    MCHC 23.4 (*)    All other components within normal limits  HEMOGLOBIN AND HEMATOCRIT, BLOOD  POCT I-STAT TROPONIN I   Results for orders placed during the hospital encounter of 73/22/02  BASIC METABOLIC PANEL      Result Value Range   Sodium 136 (*) 137 - 147 mEq/L   Potassium 4.1  3.7 - 5.3 mEq/L   Chloride 97  96 - 112 mEq/L   CO2 23  19 - 32 mEq/L   Glucose, Bld 105 (*) 70 - 99 mg/dL   BUN 9  6 - 23 mg/dL   Creatinine, Ser 0.62  0.50 - 1.10  mg/dL   Calcium 9.7  8.4 - 10.5 mg/dL   GFR calc  non Af Amer >90  >90 mL/min   GFR calc Af Amer >90  >90 mL/min  CBC      Result Value Range   WBC 12.8 (*) 4.0 - 10.5 K/uL   RBC 3.79 (*) 3.87 - 5.11 MIL/uL   Hemoglobin 8.3 (*) 12.0 - 15.0 g/dL   HCT 35.5 (*) 36.0 - 46.0 %   MCV 93.7  78.0 - 100.0 fL   MCH 21.9 (*) 26.0 - 34.0 pg   MCHC 23.4 (*) 30.0 - 36.0 g/dL   RDW 14.9  11.5 - 15.5 %   Platelets 288  150 - 400 K/uL  HEMOGLOBIN AND HEMATOCRIT, BLOOD      Result Value Range   Hemoglobin 12.5  12.0 - 15.0 g/dL   HCT 36.6  36.0 - 46.0 %  POCT I-STAT TROPONIN I      Result Value Range   Troponin i, poc 0.00  0.00 - 0.08 ng/mL   Comment 3             Imaging Review Dg Chest 2 View  03/07/2013   CLINICAL DATA:  Cough and fever  EXAM: CHEST  2 VIEW  COMPARISON:  February 06, 2013  FINDINGS: There is patchy interstitial infiltrate in the right lower lobe. Lungs elsewhere are clear. Heart size and pulmonary vascularity are normal. No adenopathy. No bone lesions. There is mild degenerative change in the thoracic spine.  IMPRESSION: Patchy interstitial infiltrate right lower lobe. Lungs otherwise clear.   Electronically Signed   By: Lowella Grip M.D.   On: 03/07/2013 10:56    EKG Interpretation   None       MDM   1. CAP (community acquired pneumonia)    Patient's chest x-ray consistent with early pneumonia. Patient treated with Zithromax here. Patient had delay in the emergency department because hemoglobin came in today with a hematocrit of 35% eventually H&H was repeated and hemoglobin came back at 12 which is normal patient we discharged home on Zithromax and Percocet for bodyaches. Patient's primary care Dr. followup. Patient nontoxic no acute distress.    Mervin Kung, MD 03/07/13 [phone]

## 2013-03-21 ENCOUNTER — Emergency Department (HOSPITAL_COMMUNITY)
Admission: EM | Admit: 2013-03-21 | Discharge: 2013-03-21 | Disposition: A | Discharge status: Home / Self Care | Payer: Medicare Other | Attending: Emergency Medicine | Admitting: Emergency Medicine

## 2013-03-21 ENCOUNTER — Encounter (HOSPITAL_COMMUNITY): Payer: Self-pay | Admitting: Emergency Medicine

## 2013-03-21 DIAGNOSIS — Z79899 Other long term (current) drug therapy: Secondary | ICD-10-CM | POA: Diagnosis not present

## 2013-03-21 DIAGNOSIS — G8929 Other chronic pain: Secondary | ICD-10-CM | POA: Insufficient documentation

## 2013-03-21 DIAGNOSIS — I10 Essential (primary) hypertension: Secondary | ICD-10-CM | POA: Insufficient documentation

## 2013-03-21 DIAGNOSIS — F17200 Nicotine dependence, unspecified, uncomplicated: Secondary | ICD-10-CM | POA: Insufficient documentation

## 2013-03-21 DIAGNOSIS — M545 Low back pain, unspecified: Secondary | ICD-10-CM | POA: Diagnosis not present

## 2013-03-21 DIAGNOSIS — Z3202 Encounter for pregnancy test, result negative: Secondary | ICD-10-CM | POA: Insufficient documentation

## 2013-03-21 DIAGNOSIS — R6883 Chills (without fever): Secondary | ICD-10-CM | POA: Insufficient documentation

## 2013-03-21 DIAGNOSIS — F329 Major depressive disorder, single episode, unspecified: Secondary | ICD-10-CM | POA: Diagnosis not present

## 2013-03-21 DIAGNOSIS — F411 Generalized anxiety disorder: Secondary | ICD-10-CM | POA: Diagnosis not present

## 2013-03-21 DIAGNOSIS — F3289 Other specified depressive episodes: Secondary | ICD-10-CM | POA: Diagnosis not present

## 2013-03-21 DIAGNOSIS — Z8709 Personal history of other diseases of the respiratory system: Secondary | ICD-10-CM | POA: Diagnosis not present

## 2013-03-21 DIAGNOSIS — N390 Urinary tract infection, site not specified: Secondary | ICD-10-CM | POA: Diagnosis not present

## 2013-03-21 DIAGNOSIS — R000 Tachycardia, unspecified: Secondary | ICD-10-CM | POA: Diagnosis not present

## 2013-03-21 DIAGNOSIS — M129 Arthropathy, unspecified: Secondary | ICD-10-CM | POA: Insufficient documentation

## 2013-03-21 DIAGNOSIS — R110 Nausea: Secondary | ICD-10-CM | POA: Diagnosis not present

## 2013-03-21 LAB — CBC WITH DIFFERENTIAL/PLATELET
Basophils Absolute: 0 10*3/uL (ref 0.0–0.1)
Basophils Relative: 0 % (ref 0–1)
Eosinophils Absolute: 0.1 10*3/uL (ref 0.0–0.7)
Eosinophils Relative: 0 % (ref 0–5)
HCT: 40.1 % (ref 36.0–46.0)
Hemoglobin: 14.2 g/dL (ref 12.0–15.0)
Lymphocytes Relative: 12 % (ref 12–46)
Lymphs Abs: 2.6 10*3/uL (ref 0.7–4.0)
MCH: 31.9 pg (ref 26.0–34.0)
MCHC: 35.4 g/dL (ref 30.0–36.0)
MCV: 90.1 fL (ref 78.0–100.0)
Monocytes Absolute: 1.5 10*3/uL — ABNORMAL HIGH (ref 0.1–1.0)
Monocytes Relative: 7 % (ref 3–12)
Neutro Abs: 17.3 10*3/uL — ABNORMAL HIGH (ref 1.7–7.7)
Neutrophils Relative %: 81 % — ABNORMAL HIGH (ref 43–77)
Platelets: 460 10*3/uL — ABNORMAL HIGH (ref 150–400)
RBC: 4.45 MIL/uL (ref 3.87–5.11)
RDW: 13.9 % (ref 11.5–15.5)
WBC: 21.4 10*3/uL — ABNORMAL HIGH (ref 4.0–10.5)

## 2013-03-21 LAB — BASIC METABOLIC PANEL
BUN: 12 mg/dL (ref 6–23)
CO2: 20 mEq/L (ref 19–32)
Calcium: 9.8 mg/dL (ref 8.4–10.5)
Chloride: 98 mEq/L (ref 96–112)
Creatinine, Ser: 0.72 mg/dL (ref 0.50–1.10)
GFR calc Af Amer: >90 mL/min (ref >90)
GFR calc non Af Amer: >90 mL/min (ref >90)
Glucose, Bld: 102 mg/dL — ABNORMAL HIGH (ref 70–99)
Potassium: 3.6 mEq/L — ABNORMAL LOW (ref 3.7–5.3)
Sodium: 136 mEq/L — ABNORMAL LOW (ref 137–147)

## 2013-03-21 LAB — WET PREP, GENITAL
Trich, Wet Prep: NONE SEEN
Yeast Wet Prep HPF POC: NONE SEEN

## 2013-03-21 LAB — URINE MICROSCOPIC-ADD ON

## 2013-03-21 LAB — URINALYSIS, ROUTINE W REFLEX MICROSCOPIC
Bilirubin Urine: NEGATIVE
Glucose, UA: NEGATIVE mg/dL
Ketones, ur: NEGATIVE mg/dL
Nitrite: NEGATIVE
Protein, ur: >300 mg/dL — AB
Specific Gravity, Urine: 1.021 (ref 1.005–1.030)
Urobilinogen, UA: 1 mg/dL (ref 0.0–1.0)
pH: 6 (ref 5.0–8.0)

## 2013-03-21 LAB — PREGNANCY, URINE: Preg Test, Ur: NEGATIVE

## 2013-03-21 MED ORDER — ONDANSETRON HCL 4 MG/2ML IJ SOLN
4.0000 mg | Freq: Once | INTRAMUSCULAR | Status: AC
  Administered 2013-03-21: 4 mg via INTRAVENOUS
  Filled 2013-03-21: qty 2

## 2013-03-21 MED ORDER — CEPHALEXIN 500 MG PO CAPS: 500.0000 mg | ORAL_CAPSULE | Freq: Four times a day (QID) | ORAL | Status: DC

## 2013-03-21 MED ORDER — MORPHINE SULFATE 4 MG/ML IJ SOLN
4.0000 mg | Freq: Once | INTRAMUSCULAR | Status: AC
  Administered 2013-03-21: 4 mg via INTRAVENOUS
  Filled 2013-03-21: qty 1

## 2013-03-21 MED ORDER — DEXTROSE 5 % IV SOLN
1.0000 g | Freq: Once | INTRAVENOUS | Status: AC
  Administered 2013-03-21: 1 g via INTRAVENOUS
  Filled 2013-03-21: qty 10

## 2013-03-21 MED ORDER — HYDROCODONE-ACETAMINOPHEN 5-325 MG PO TABS: 2.0000 | ORAL_TABLET | Freq: Four times a day (QID) | ORAL | Status: DC | PRN

## 2013-03-21 MED ORDER — OXYCODONE-ACETAMINOPHEN 5-325 MG PO TABS: 1.0000 | ORAL_TABLET | Freq: Four times a day (QID) | ORAL | Status: DC | PRN

## 2013-03-21 NOTE — ED Notes (Signed)
Pt here with burning upon urination and blood in her urine , no discharge noted

## 2013-03-21 NOTE — ED Provider Notes (Signed)
CSN: [id number]     Arrival date & time 03/21/13  1040 History   First MD Initiated Contact with Patient 03/21/13 1118     Chief Complaint  Patient presents with  . Hematuria  . Urinary Tract Infection   (Consider location/radiation/quality/duration/timing/severity/associated sxs/prior Treatment) HPI Comments: Patient presents to the emergency department with chief complaint of hematuria with dysuria. She states the symptoms began approximately 3 days ago. She endorses associated chills, nausea, and urinary frequency. She states that she has some mild to moderate suprapubic pain. She states that she has not had a menstrual period in approximately 1 year. However, she is uncertain if the bleeding is in the urine, or coming from the vagina. She states that she did apply tissue around the vagina, and it did not yield any blood.  The history is provided by the patient. No language interpreter was used.    Past Medical History  Diagnosis Date  . Anxiety   . Hypertension   . Chronic low back pain 1991    disk s/p 4 diskectomies, 5th fursion, then spinal cord stimulator ([facility])  . Bronchitis     2 weeks ago rx regime  . Depression   . GERD (gastroesophageal reflux disease)   . Arthritis   . Rotator cuff disorder    Past Surgical History  Procedure Laterality Date  . Spine surgery  7/ 12 last    5 previous  . Stimulator  10    spine,removed 7/12  . Cesarean section  84  . Lumbar fusion  04/27/2011   Family History  Problem Relation Age of Onset  . Stroke Father   . Hypertension Father    History  Substance Use Topics  . Smoking status: Current Every Day Smoker -- 1.00 packs/day for 30 years    Types: Cigarettes  . Smokeless tobacco: Never Used     Comment: prior trial of zyban made her feel weird   . Alcohol Use: No   OB History   Grav Para Term Preterm Abortions TAB SAB Ect Mult Living                 Review of Systems  Constitutional: Negative for fever.   Gastrointestinal: Positive for nausea. Negative for vomiting and diarrhea.  Genitourinary: Positive for dysuria, urgency and hematuria. Negative for vaginal bleeding and vaginal discharge.    Allergies  Review of patient's allergies indicates no known allergies.  Home Medications   Current Outpatient Rx  Name  Route  Sig  Dispense  Refill  . albuterol (PROVENTIL HFA;VENTOLIN HFA) 108 (90 BASE) MCG/ACT inhaler   Inhalation   Inhale 2 puffs into the lungs every 6 (six) hours as needed for wheezing or shortness of breath.   1 Inhaler   2   . FLUoxetine (PROZAC) 20 MG tablet   Oral   Take 60 mg by mouth daily.          Marland Kitchen gabapentin (NEURONTIN) 400 MG capsule   Oral   Take 400 mg by mouth 4 (four) times daily.         . lansoprazole (PREVACID) 30 MG capsule   Oral   Take 30 mg by mouth daily.          . promethazine (PHENERGAN) 25 MG tablet   Oral   Take 25 mg by mouth daily as needed for nausea.          . valsartan-hydrochlorothiazide (DIOVAN-HCT) 160-12.5 MG per tablet   Oral   Take  1 tablet by mouth daily.          BP 125/95  Pulse 113  Temp(Src) 98.1 F (36.7 C) (Oral)  Resp 18  Ht 5\' 7"  (1.702 m)  Wt 185 lb (83.915 kg)  BMI 28.97 kg/m2  SpO2 100% Physical Exam  Nursing note and vitals reviewed. Constitutional: She is oriented to person, place, and time. She appears well-developed and well-nourished.  HENT:  Head: Normocephalic and atraumatic.  Eyes: Conjunctivae and EOM are normal.  Neck: Normal range of motion.  Cardiovascular: Normal rate, regular rhythm and normal heart sounds.   Slightly tachycardic  Pulmonary/Chest: Effort normal and breath sounds normal. No respiratory distress. She has no wheezes.  Abdominal: She exhibits no distension and no mass. There is no tenderness. There is no rebound and no guarding.  No focal abdominal tenderness  Genitourinary: No labial fusion. There is no rash, tenderness, lesion or injury on the right labia.  There is no rash, tenderness, lesion or injury on the left labia. Uterus is not deviated, not enlarged, not fixed and not tender. Cervix exhibits no motion tenderness, no discharge and no friability. Right adnexum displays no mass, no tenderness and no fullness. Left adnexum displays no mass, no tenderness and no fullness. No erythema, tenderness or bleeding around the vagina. No foreign body around the vagina. No signs of injury around the vagina. No vaginal discharge found.  Chaperone present  Musculoskeletal: Normal range of motion.  Neurological: She is alert and oriented to person, place, and time.  Skin: Skin is dry.  Psychiatric: She has a normal mood and affect. Her behavior is normal. Judgment and thought content normal.    ED Course  Procedures (including critical care time) Results for orders placed during the hospital encounter of 03/21/13  WET PREP, GENITAL      Result Value Range   Yeast Wet Prep HPF POC NONE SEEN  NONE SEEN   Trich, Wet Prep NONE SEEN  NONE SEEN   Clue Cells Wet Prep HPF POC FEW (*) NONE SEEN   WBC, Wet Prep HPF POC FEW (*) NONE SEEN  URINALYSIS, ROUTINE W REFLEX MICROSCOPIC      Result Value Range   Color, Urine RED (*) YELLOW   APPearance CLOUDY (*) CLEAR   Specific Gravity, Urine 1.021  1.005 - 1.030   pH 6.0  5.0 - 8.0   Glucose, UA NEGATIVE  NEGATIVE mg/dL   Hgb urine dipstick LARGE (*) NEGATIVE   Bilirubin Urine NEGATIVE  NEGATIVE   Ketones, ur NEGATIVE  NEGATIVE mg/dL   Protein, ur >300 (*) NEGATIVE mg/dL   Urobilinogen, UA 1.0  0.0 - 1.0 mg/dL   Nitrite NEGATIVE  NEGATIVE   Leukocytes, UA LARGE (*) NEGATIVE  URINE MICROSCOPIC-ADD ON      Result Value Range   Squamous Epithelial / LPF FEW (*) RARE   WBC, UA 21-50  <3 WBC/hpf   RBC / HPF TOO NUMEROUS TO COUNT  <3 RBC/hpf   Bacteria, UA MANY (*) RARE   Urine-Other LESS THAN 10 mL OF URINE SUBMITTED    PREGNANCY, URINE      Result Value Range   Preg Test, Ur NEGATIVE  NEGATIVE  CBC WITH  DIFFERENTIAL      Result Value Range   WBC 21.4 (*) 4.0 - 10.5 K/uL   RBC 4.45  3.87 - 5.11 MIL/uL   Hemoglobin 14.2  12.0 - 15.0 g/dL   HCT 40.1  36.0 - 46.0 %   MCV  90.1  78.0 - 100.0 fL   MCH 31.9  26.0 - 34.0 pg   MCHC 35.4  30.0 - 36.0 g/dL   RDW 13.9  11.5 - 15.5 %   Platelets 460 (*) 150 - 400 K/uL   Neutrophils Relative % 81 (*) 43 - 77 %   Neutro Abs 17.3 (*) 1.7 - 7.7 K/uL   Lymphocytes Relative 12  12 - 46 %   Lymphs Abs 2.6  0.7 - 4.0 K/uL   Monocytes Relative 7  3 - 12 %   Monocytes Absolute 1.5 (*) 0.1 - 1.0 K/uL   Eosinophils Relative 0  0 - 5 %   Eosinophils Absolute 0.1  0.0 - 0.7 K/uL   Basophils Relative 0  0 - 1 %   Basophils Absolute 0.0  0.0 - 0.1 K/uL  BASIC METABOLIC PANEL      Result Value Range   Sodium 136 (*) 137 - 147 mEq/L   Potassium 3.6 (*) 3.7 - 5.3 mEq/L   Chloride 98  96 - 112 mEq/L   CO2 20  19 - 32 mEq/L   Glucose, Bld 102 (*) 70 - 99 mg/dL   BUN 12  6 - 23 mg/dL   Creatinine, Ser 0.72  0.50 - 1.10 mg/dL   Calcium 9.8  8.4 - 10.5 mg/dL   GFR calc non Af Amer >90  >90 mL/min   GFR calc Af Amer >90  >90 mL/min   Dg Chest 2 View  03/07/2013   CLINICAL DATA:  Cough and fever  EXAM: CHEST  2 VIEW  COMPARISON:  February 06, 2013  FINDINGS: There is patchy interstitial infiltrate in the right lower lobe. Lungs elsewhere are clear. Heart size and pulmonary vascularity are normal. No adenopathy. No bone lesions. There is mild degenerative change in the thoracic spine.  IMPRESSION: Patchy interstitial infiltrate right lower lobe. Lungs otherwise clear.   Electronically Signed   By: Lowella Grip M.D.   On: 03/07/2013 10:56     EKG Interpretation   None       MDM   1. UTI (lower urinary tract infection)     Issue is hematuria, dysuria, and urinary frequency. We'll check urinalysis, and urine pregnant. Patient has not had a menstrual cycle in approximately one year.  1:34 PM UA remarkable for UTI. Presentation not consistent with  kidney stones. No CT scan at this time. We'll check labs. Patient has history of severe pyelonephritis. Pelvic exam is unremarkable. No bleeding from the vagina.  2:42 PM Labs are reassuring. Patient is nontoxic-appearing. She has not any apparent distress. She feels better with treatment in the ED. Will discharge to home with Keflex and pain medicine. Patient discussed with Dr. Stark Jock, who agrees with the plan. Patient is stable and a for discharge.  Filed Vitals:   03/21/13 1407  BP: 116/78  Pulse: 88  Temp:   Resp: [address], PA-C 03/21/13 1443

## 2013-03-21 NOTE — Discharge Instructions (Signed)
Urinary Tract Infection  Urinary tract infections (UTIs) can develop anywhere along your urinary tract. Your urinary tract is your body's drainage system for removing wastes and extra water. Your urinary tract includes two kidneys, two ureters, a bladder, and a urethra. Your kidneys are a pair of bean-shaped organs. Each kidney is about the size of your fist. They are located below your ribs, one on each side of your spine.  CAUSES  Infections are caused by microbes, which are microscopic organisms, including fungi, viruses, and bacteria. These organisms are so small that they can only be seen through a microscope. Bacteria are the microbes that most commonly cause UTIs.  SYMPTOMS   Symptoms of UTIs may vary by age and gender of the patient and by the location of the infection. Symptoms in young women typically include a frequent and intense urge to urinate and a painful, burning feeling in the bladder or urethra during urination. Older women and men are more likely to be tired, shaky, and weak and have muscle aches and abdominal pain. A fever may mean the infection is in your kidneys. Other symptoms of a kidney infection include pain in your back or sides below the ribs, nausea, and vomiting.  DIAGNOSIS  To diagnose a UTI, your caregiver will ask you about your symptoms. Your caregiver also will ask to provide a urine sample. The urine sample will be tested for bacteria and white blood cells. White blood cells are made by your body to help fight infection.  TREATMENT   Typically, UTIs can be treated with medication. Because most UTIs are caused by a bacterial infection, they usually can be treated with the use of antibiotics. The choice of antibiotic and length of treatment depend on your symptoms and the type of bacteria causing your infection.  HOME CARE INSTRUCTIONS   If you were prescribed antibiotics, take them exactly as your caregiver instructs you. Finish the medication even if you feel better after you  have only taken some of the medication.   Drink enough water and fluids to keep your urine clear or pale yellow.   Avoid caffeine, tea, and carbonated beverages. They tend to irritate your bladder.   Empty your bladder often. Avoid holding urine for long periods of time.   Empty your bladder before and after sexual intercourse.   After a bowel movement, women should cleanse from front to back. Use each tissue only once.  SEEK MEDICAL CARE IF:    You have back pain.   You develop a fever.   Your symptoms do not begin to resolve within 3 days.  SEEK IMMEDIATE MEDICAL CARE IF:    You have severe back pain or lower abdominal pain.   You develop chills.   You have nausea or vomiting.   You have continued burning or discomfort with urination.  MAKE SURE YOU:    Understand these instructions.   Will watch your condition.   Will get help right away if you are not doing well or get worse.  Document Released: 11/17/2004 Document Revised: 08/09/2011 Document Reviewed: 03/18/2011  [employer] Patient Information 2014 [employer].

## 2013-03-22 LAB — GC/CHLAMYDIA PROBE AMP
CT Probe RNA: NEGATIVE
GC Probe RNA: NEGATIVE

## 2013-03-22 NOTE — ED Provider Notes (Signed)
Medical screening examination/treatment/procedure(s) were performed by non-physician practitioner and as supervising physician I was immediately available for consultation/collaboration.     Revella Shelton, MD 03/22/13 0726 

## 2013-03-23 LAB — URINE CULTURE: Colony Count: >=100000

## 2013-03-24 ENCOUNTER — Telehealth (HOSPITAL_COMMUNITY): Payer: Self-pay | Admitting: Emergency Medicine

## 2013-03-24 NOTE — ED Notes (Signed)
Post ED Visit - Positive Culture Follow-up  Culture report reviewed by antimicrobial stewardship pharmacist: []  Wes McHenry, Pharm.D., BCPS []  Heide Guile, Pharm.D., BCPS []  Alycia Rossetti, Pharm.D., BCPS []  [place], Pharm.D., BCPS, AAHIVP [x]  Legrand Como, Pharm.D., BCPS, AAHIVP  Positive urine culture Treated with Keflex, organism sensitive to the same and no further patient follow-up is required at this time.  Myrna Blazer 03/24/2013, 5:25 PM

## 2013-03-28 DIAGNOSIS — F331 Major depressive disorder, recurrent, moderate: Secondary | ICD-10-CM | POA: Diagnosis not present

## 2013-04-07 ENCOUNTER — Emergency Department (HOSPITAL_COMMUNITY)
Admission: EM | Admit: 2013-04-07 | Discharge: 2013-04-07 | Disposition: A | Discharge status: Home / Self Care | Payer: Medicare Other | Attending: Emergency Medicine | Admitting: Emergency Medicine

## 2013-04-07 ENCOUNTER — Emergency Department (HOSPITAL_COMMUNITY): Payer: Medicare Other

## 2013-04-07 ENCOUNTER — Encounter (HOSPITAL_COMMUNITY): Payer: Self-pay | Admitting: Emergency Medicine

## 2013-04-07 DIAGNOSIS — F17200 Nicotine dependence, unspecified, uncomplicated: Secondary | ICD-10-CM | POA: Insufficient documentation

## 2013-04-07 DIAGNOSIS — R42 Dizziness and giddiness: Secondary | ICD-10-CM | POA: Diagnosis not present

## 2013-04-07 DIAGNOSIS — I10 Essential (primary) hypertension: Secondary | ICD-10-CM | POA: Diagnosis not present

## 2013-04-07 DIAGNOSIS — Z8739 Personal history of other diseases of the musculoskeletal system and connective tissue: Secondary | ICD-10-CM | POA: Insufficient documentation

## 2013-04-07 DIAGNOSIS — K219 Gastro-esophageal reflux disease without esophagitis: Secondary | ICD-10-CM | POA: Diagnosis not present

## 2013-04-07 DIAGNOSIS — J019 Acute sinusitis, unspecified: Secondary | ICD-10-CM | POA: Diagnosis not present

## 2013-04-07 DIAGNOSIS — F3289 Other specified depressive episodes: Secondary | ICD-10-CM | POA: Insufficient documentation

## 2013-04-07 DIAGNOSIS — F329 Major depressive disorder, single episode, unspecified: Secondary | ICD-10-CM | POA: Insufficient documentation

## 2013-04-07 DIAGNOSIS — Z792 Long term (current) use of antibiotics: Secondary | ICD-10-CM | POA: Diagnosis not present

## 2013-04-07 DIAGNOSIS — J069 Acute upper respiratory infection, unspecified: Secondary | ICD-10-CM | POA: Insufficient documentation

## 2013-04-07 DIAGNOSIS — R918 Other nonspecific abnormal finding of lung field: Secondary | ICD-10-CM | POA: Diagnosis not present

## 2013-04-07 DIAGNOSIS — Z79899 Other long term (current) drug therapy: Secondary | ICD-10-CM | POA: Diagnosis not present

## 2013-04-07 DIAGNOSIS — J209 Acute bronchitis, unspecified: Secondary | ICD-10-CM | POA: Diagnosis not present

## 2013-04-07 DIAGNOSIS — F411 Generalized anxiety disorder: Secondary | ICD-10-CM | POA: Insufficient documentation

## 2013-04-07 DIAGNOSIS — J329 Chronic sinusitis, unspecified: Secondary | ICD-10-CM | POA: Diagnosis not present

## 2013-04-07 DIAGNOSIS — G8929 Other chronic pain: Secondary | ICD-10-CM | POA: Diagnosis not present

## 2013-04-07 DIAGNOSIS — J40 Bronchitis, not specified as acute or chronic: Secondary | ICD-10-CM

## 2013-04-07 DIAGNOSIS — Z87440 Personal history of urinary (tract) infections: Secondary | ICD-10-CM | POA: Insufficient documentation

## 2013-04-07 DIAGNOSIS — M549 Dorsalgia, unspecified: Secondary | ICD-10-CM | POA: Insufficient documentation

## 2013-04-07 MED ORDER — IPRATROPIUM BROMIDE 0.02 % IN SOLN
0.5000 mg | Freq: Once | RESPIRATORY_TRACT | Status: AC
  Administered 2013-04-07: 0.5 mg via RESPIRATORY_TRACT
  Filled 2013-04-07: qty 2.5

## 2013-04-07 MED ORDER — ALBUTEROL SULFATE (2.5 MG/3ML) 0.083% IN NEBU
5.0000 mg | INHALATION_SOLUTION | Freq: Once | RESPIRATORY_TRACT | Status: AC
  Administered 2013-04-07: 5 mg via RESPIRATORY_TRACT
  Filled 2013-04-07: qty 6

## 2013-04-07 MED ORDER — HYDROCODONE-ACETAMINOPHEN 5-325 MG PO TABS
1.0000 | ORAL_TABLET | Freq: Once | ORAL | Status: AC
  Administered 2013-04-07: 1 via ORAL
  Filled 2013-04-07: qty 1

## 2013-04-07 MED ORDER — KETOROLAC TROMETHAMINE 60 MG/2ML IM SOLN
60.0000 mg | Freq: Once | INTRAMUSCULAR | Status: AC
  Administered 2013-04-07: 60 mg via INTRAMUSCULAR
  Filled 2013-04-07: qty 2

## 2013-04-07 MED ORDER — AZITHROMYCIN 250 MG PO TABS: ORAL_TABLET | ORAL | Status: DC

## 2013-04-07 MED ORDER — TRAMADOL HCL 50 MG PO TABS: 50.0000 mg | ORAL_TABLET | Freq: Four times a day (QID) | ORAL | Status: DC | PRN

## 2013-04-07 NOTE — ED Notes (Signed)
Pt reports cold symptoms for past couple of weeks. Pt c/o pain to head and ears. Pt reports that she completed all the antibiotics that were prescribe to her.

## 2013-04-07 NOTE — Discharge Instructions (Signed)
Take antibiotic to completion for your sinus infection and bronchitis.  Bronchitis Bronchitis is inflammation of the airways that extend from the windpipe into the lungs (bronchi). The inflammation often causes mucus to develop, which leads to a cough. If the inflammation becomes severe, it may cause shortness of breath. CAUSES  Bronchitis may be caused by:   Viral infections.   Bacteria.   Cigarette smoke.   Allergens, pollutants, and other irritants.  SIGNS AND SYMPTOMS  The most common symptom of bronchitis is a frequent cough that produces mucus. Other symptoms include:  Fever.   Body aches.   Chest congestion.   Chills.   Shortness of breath.   Sore throat.  DIAGNOSIS  Bronchitis is usually diagnosed through a medical history and physical exam. Tests, such as chest X-rays, are sometimes done to rule out other conditions.  TREATMENT  You may need to avoid contact with whatever caused the problem (smoking, for example). Medicines are sometimes needed. These may include:  Antibiotics. These may be prescribed if the condition is caused by bacteria.  Cough suppressants. These may be prescribed for relief of cough symptoms.   Inhaled medicines. These may be prescribed to help open your airways and make it easier for you to breathe.   Steroid medicines. These may be prescribed for those with recurrent (chronic) bronchitis. HOME CARE INSTRUCTIONS  Get plenty of rest.   Drink enough fluids to keep your urine clear or pale yellow (unless you have a medical condition that requires fluid restriction). Increasing fluids may help thin your secretions and will prevent dehydration.   Only take over-the-counter or prescription medicines as directed by your health care provider.  Only take antibiotics as directed. Make sure you finish them even if you start to feel better.  Avoid secondhand smoke, irritating chemicals, and strong fumes. These will make bronchitis  worse. If you are a smoker, quit smoking. Consider using nicotine gum or skin patches to help control withdrawal symptoms. Quitting smoking will help your lungs heal faster.   Put a cool-mist humidifier in your bedroom at night to moisten the air. This may help loosen mucus. Change the water in the humidifier daily. You can also run the hot water in your shower and sit in the bathroom with the door closed for 5 10 minutes.   Follow up with your health care provider as directed.   Wash your hands frequently to avoid catching bronchitis again or spreading an infection to others.  SEEK MEDICAL CARE IF: Your symptoms do not improve after 1 week of treatment.  SEEK IMMEDIATE MEDICAL CARE IF:  Your fever increases.  You have chills.   You have chest pain.   You have worsening shortness of breath.   You have bloody sputum.  You faint.  You have lightheadedness.  You have a severe headache.   You vomit repeatedly. MAKE SURE YOU:   Understand these instructions.  Will watch your condition.  Will get help right away if you are not doing well or get worse. Document Released: 02/07/2005 Document Revised: 11/28/2012 Document Reviewed: 10/02/2012 [facility] Patient Information 2014 [place].  Sinusitis Sinusitis is redness, soreness, and swelling (inflammation) of the paranasal sinuses. Paranasal sinuses are air pockets within the bones of your face (beneath the eyes, the middle of the forehead, or above the eyes). In healthy paranasal sinuses, mucus is able to drain out, and air is able to circulate through them by way of your nose. However, when your paranasal sinuses are inflamed,  mucus and air can become trapped. This can allow bacteria and other germs to grow and cause infection. Sinusitis can develop quickly and last only a short time (acute) or continue over a long period (chronic). Sinusitis that lasts for more than 12 weeks is considered chronic.  CAUSES  Causes of  sinusitis include:  Allergies.  Structural abnormalities, such as displacement of the cartilage that separates your nostrils (deviated septum), which can decrease the air flow through your nose and sinuses and affect sinus drainage.  Functional abnormalities, such as when the small hairs (cilia) that line your sinuses and help remove mucus do not work properly or are not present. SYMPTOMS  Symptoms of acute and chronic sinusitis are the same. The primary symptoms are pain and pressure around the affected sinuses. Other symptoms include:  Upper toothache.  Earache.  Headache.  Bad breath.  Decreased sense of smell and taste.  A cough, which worsens when you are lying flat.  Fatigue.  Fever.  Thick drainage from your nose, which often is green and may contain pus (purulent).  Swelling and warmth over the affected sinuses. DIAGNOSIS  Your caregiver will perform a physical exam. During the exam, your caregiver may:  Look in your nose for signs of abnormal growths in your nostrils (nasal polyps).  Tap over the affected sinus to check for signs of infection.  View the inside of your sinuses (endoscopy) with a special imaging device with a light attached (endoscope), which is inserted into your sinuses. If your caregiver suspects that you have chronic sinusitis, one or more of the following tests may be recommended:  Allergy tests.  Nasal culture A sample of mucus is taken from your nose and sent to a lab and screened for bacteria.  Nasal cytology A sample of mucus is taken from your nose and examined by your caregiver to determine if your sinusitis is related to an allergy. TREATMENT  Most cases of acute sinusitis are related to a viral infection and will resolve on their own within 10 days. Sometimes medicines are prescribed to help relieve symptoms (pain medicine, decongestants, nasal steroid sprays, or saline sprays).  However, for sinusitis related to a bacterial  infection, your caregiver will prescribe antibiotic medicines. These are medicines that will help kill the bacteria causing the infection.  Rarely, sinusitis is caused by a fungal infection. In theses cases, your caregiver will prescribe antifungal medicine. For some cases of chronic sinusitis, surgery is needed. Generally, these are cases in which sinusitis recurs more than 3 times per year, despite other treatments. HOME CARE INSTRUCTIONS   Drink plenty of water. Water helps thin the mucus so your sinuses can drain more easily.  Use a humidifier.  Inhale steam 3 to 4 times a day (for example, sit in the bathroom with the shower running).  Apply a warm, moist washcloth to your face 3 to 4 times a day, or as directed by your caregiver.  Use saline nasal sprays to help moisten and clean your sinuses.  Take over-the-counter or prescription medicines for pain, discomfort, or fever only as directed by your caregiver. SEEK IMMEDIATE MEDICAL CARE IF:  You have increasing pain or severe headaches.  You have nausea, vomiting, or drowsiness.  You have swelling around your face.  You have vision problems.  You have a stiff neck.  You have difficulty breathing. MAKE SURE YOU:   Understand these instructions.  Will watch your condition.  Will get help right away if you are not doing  well or get worse. Document Released: 02/07/2005 Document Revised: 05/02/2011 Document Reviewed: 02/22/2011 [facility] Patient Information 2014 [place], [place].

## 2013-04-07 NOTE — ED Provider Notes (Signed)
CSN: [id number]     Arrival date & time 04/07/13  1546 History   First MD Initiated Contact with Patient 04/07/13 1623     Chief Complaint  Patient presents with  . Headache  . Back Pain  . URI     (Consider location/radiation/quality/duration/timing/severity/associated sxs/prior Treatment) HPI Comments: Pt is a 53 y/o female who presents to the ED complaining of continued URI s/s x 2 weeks. States she has had a lot of sinus pressure, nasal congestion, chest congestion, nonproductive cough. She has been blowing some dried blood out of her nose. Symptoms worse when she bends forward causing dizziness. Admits to fever one week ago of 102.5, this symptom has not returned. Admits to associated arthralgias myalgias, exacerbation of her chronic back pain. 2 weeks ago she was treated for a UTI with Keflex, states the symptoms have subsided. Denies shortness of breath, nausea, vomiting or diarrhea which she did have a little over a week ago. Reports she has been taking mucinex with little relief.   Patient is a 53 y.o. female presenting with headaches, back pain, and URI. The history is provided by the patient.  Headache Associated symptoms: back pain, congestion, fatigue, myalgias, sinus pressure and URI   Back Pain Associated symptoms: headaches   URI Presenting symptoms: congestion, fatigue and rhinorrhea   Associated symptoms: arthralgias, headaches and myalgias     Past Medical History  Diagnosis Date  . Anxiety   . Hypertension   . Chronic low back pain 1991    disk s/p 4 diskectomies, 5th fursion, then spinal cord stimulator ([facility])  . Bronchitis     2 weeks ago rx regime  . Depression   . GERD (gastroesophageal reflux disease)   . Arthritis   . Rotator cuff disorder    Past Surgical History  Procedure Laterality Date  . Spine surgery  7/ 12 last    5 previous  . Stimulator  12    spine,removed 7/12  . Cesarean section  84  . Lumbar fusion  04/27/2011   Family History   Problem Relation Age of Onset  . Stroke Father   . Hypertension Father    History  Substance Use Topics  . Smoking status: Current Every Day Smoker -- 1.00 packs/day for 30 years    Types: Cigarettes  . Smokeless tobacco: Never Used     Comment: prior trial of zyban made her feel weird   . Alcohol Use: No   OB History   Grav Para Term Preterm Abortions TAB SAB Ect Mult Living                 Review of Systems  Constitutional: Positive for fatigue.  HENT: Positive for congestion, rhinorrhea and sinus pressure.   Musculoskeletal: Positive for arthralgias, back pain and myalgias.  Neurological: Positive for headaches.  All other systems reviewed and are negative.      Allergies  Review of patient's allergies indicates no known allergies.  Home Medications   Current Outpatient Rx  Name  Route  Sig  Dispense  Refill  . albuterol (PROVENTIL HFA;VENTOLIN HFA) 108 (90 BASE) MCG/ACT inhaler   Inhalation   Inhale 2 puffs into the lungs every 6 (six) hours as needed for wheezing or shortness of breath.   1 Inhaler   2   . FLUoxetine (PROZAC) 20 MG tablet   Oral   Take 60 mg by mouth daily.          Marland Kitchen gabapentin (NEURONTIN) 400 MG  capsule   Oral   Take 400 mg by mouth 4 (four) times daily.         Marland Kitchen guaiFENesin (MUCINEX) 600 MG 12 hr tablet   Oral   Take 600 mg by mouth 2 (two) times daily.         . lansoprazole (PREVACID) 30 MG capsule   Oral   Take 30 mg by mouth daily.          . promethazine (PHENERGAN) 25 MG tablet   Oral   Take 25 mg by mouth daily as needed for nausea.          . valsartan-hydrochlorothiazide (DIOVAN-HCT) 160-12.5 MG per tablet   Oral   Take 1 tablet by mouth daily.         Marland Kitchen azithromycin (ZITHROMAX Z-PAK) 250 MG tablet      2 po day one, then 1 daily x 4 days   6 tablet   0   . cephALEXin (KEFLEX) 500 MG capsule   Oral   Take 1 capsule (500 mg total) by mouth 4 (four) times daily.   40 capsule   0    BP 129/81   Pulse 91  Temp(Src) 97.6 F (36.4 C) (Oral)  Resp 18  Ht 5\' 7"  (1.702 m)  Wt 191 lb (86.637 kg)  BMI 29.91 kg/m2  SpO2 96% Physical Exam  Nursing note and vitals reviewed. Constitutional: She is oriented to person, place, and time. She appears well-developed and well-nourished. No distress.  HENT:  Head: Normocephalic and atraumatic.  Right Ear: Tympanic membrane normal.  Left Ear: Tympanic membrane normal.  Nose: Mucosal edema present. Right sinus exhibits maxillary sinus tenderness and frontal sinus tenderness. Left sinus exhibits maxillary sinus tenderness and frontal sinus tenderness.  Mouth/Throat: Oropharynx is clear and moist.  Eyes: Conjunctivae are normal.  Neck: Normal range of motion. Neck supple.  Cardiovascular: Normal rate, regular rhythm, normal heart sounds and intact distal pulses.   Pulmonary/Chest: Effort normal. No respiratory distress.  Scattered expiratory > inspiratory wheezes and rhonchi.  Abdominal: Soft. Bowel sounds are normal. There is no tenderness.  Musculoskeletal: Normal range of motion. She exhibits no edema and no tenderness.  Neurological: She is alert and oriented to person, place, and time.  Skin: Skin is warm and dry. She is not diaphoretic.  Psychiatric: She has a normal mood and affect. Her behavior is normal.    ED Course  Procedures (including critical care time) Labs Review Labs Reviewed - No data to display Imaging Review Dg Chest 2 View  04/07/2013   CLINICAL DATA:  HEADACHE BACK PAIN URI  EXAM: CHEST  2 VIEW  COMPARISON:  DG CHEST 2 VIEW dated 03/07/2013  FINDINGS: The heart size and mediastinal contours are within normal limits. Both lungs are clear. The visualized skeletal structures are unremarkable.  IMPRESSION: No active cardiopulmonary disease.   Electronically Signed   By: Margaree Mackintosh M.D.   On: 04/07/2013 18:09    EKG Interpretation   None       MDM   Final diagnoses:  Sinusitis  Bronchitis    Patient  presenting with sinusitis and bronchitis-like symptoms. She is well appearing and in no apparent distress, afebrile, vital signs stable. Expiratory wheezes and rhonchi present on exam. Chest x-ray pending. Will give DuoNeb and reassess. 6:44 PM Patient reports some improvement of her symptoms after doing that. On repeat lung examination, breath sounds improved. Chest x-ray clear. Patient will be discharged home with azithromycin given symptoms  present for 2 weeks. Stable for d/c. Return precautions given. Patient states understanding of treatment care plan and is agreeable.   Illene Labrador, PA-C 04/07/13 [phone]

## 2013-04-08 NOTE — ED Provider Notes (Signed)
Medical screening examination/treatment/procedure(s) were performed by non-physician practitioner and as supervising physician I was immediately available for consultation/collaboration.  EKG Interpretation   None         Delice Bison Presley Summerlin, DO 04/08/13 0222

## 2013-04-14 ENCOUNTER — Encounter (HOSPITAL_COMMUNITY): Payer: Self-pay | Admitting: Emergency Medicine

## 2013-04-14 ENCOUNTER — Emergency Department (HOSPITAL_COMMUNITY)
Admission: EM | Admit: 2013-04-14 | Discharge: 2013-04-14 | Disposition: A | Discharge status: Home / Self Care | Payer: Medicare Other | Attending: Emergency Medicine | Admitting: Emergency Medicine

## 2013-04-14 DIAGNOSIS — I10 Essential (primary) hypertension: Secondary | ICD-10-CM | POA: Insufficient documentation

## 2013-04-14 DIAGNOSIS — M545 Low back pain, unspecified: Secondary | ICD-10-CM | POA: Diagnosis not present

## 2013-04-14 DIAGNOSIS — G8929 Other chronic pain: Secondary | ICD-10-CM

## 2013-04-14 DIAGNOSIS — Z8739 Personal history of other diseases of the musculoskeletal system and connective tissue: Secondary | ICD-10-CM | POA: Diagnosis not present

## 2013-04-14 DIAGNOSIS — F329 Major depressive disorder, single episode, unspecified: Secondary | ICD-10-CM | POA: Diagnosis not present

## 2013-04-14 DIAGNOSIS — Z792 Long term (current) use of antibiotics: Secondary | ICD-10-CM | POA: Insufficient documentation

## 2013-04-14 DIAGNOSIS — G8921 Chronic pain due to trauma: Secondary | ICD-10-CM | POA: Insufficient documentation

## 2013-04-14 DIAGNOSIS — F3289 Other specified depressive episodes: Secondary | ICD-10-CM | POA: Insufficient documentation

## 2013-04-14 DIAGNOSIS — K219 Gastro-esophageal reflux disease without esophagitis: Secondary | ICD-10-CM | POA: Insufficient documentation

## 2013-04-14 DIAGNOSIS — Z79899 Other long term (current) drug therapy: Secondary | ICD-10-CM | POA: Diagnosis not present

## 2013-04-14 DIAGNOSIS — M546 Pain in thoracic spine: Secondary | ICD-10-CM | POA: Insufficient documentation

## 2013-04-14 DIAGNOSIS — F411 Generalized anxiety disorder: Secondary | ICD-10-CM | POA: Diagnosis not present

## 2013-04-14 DIAGNOSIS — F17200 Nicotine dependence, unspecified, uncomplicated: Secondary | ICD-10-CM | POA: Insufficient documentation

## 2013-04-14 DIAGNOSIS — M549 Dorsalgia, unspecified: Secondary | ICD-10-CM

## 2013-04-14 MED ORDER — MELOXICAM 7.5 MG PO TABS: 15.0000 mg | ORAL_TABLET | Freq: Every day | ORAL | Status: DC

## 2013-04-14 MED ORDER — TRAMADOL HCL 50 MG PO TABS
50.0000 mg | ORAL_TABLET | Freq: Once | ORAL | Status: AC
  Administered 2013-04-14: 50 mg via ORAL
  Filled 2013-04-14: qty 1

## 2013-04-14 NOTE — ED Notes (Signed)
Per pt sts upper back pain after fall a few weeks ago.

## 2013-04-14 NOTE — Discharge Instructions (Signed)
Chronic Back Pain  When back pain lasts longer than 3 months, it is called chronic back pain.People with chronic back pain often go through certain periods that are more intense (flare-ups).  CAUSES Chronic back pain can be caused by wear and tear (degeneration) on different structures in your back. These structures include:  The bones of your spine (vertebrae) and the joints surrounding your spinal cord and nerve roots (facets).  The strong, fibrous tissues that connect your vertebrae (ligaments). Degeneration of these structures may result in pressure on your nerves. This can lead to constant pain. HOME CARE INSTRUCTIONS  Avoid bending, heavy lifting, prolonged sitting, and activities which make the problem worse.  Take brief periods of rest throughout the day to reduce your pain. Lying down or standing usually is better than sitting while you are resting.  Take over-the-counter or prescription medicines only as directed by your caregiver. SEEK IMMEDIATE MEDICAL CARE IF:   You have weakness or numbness in one of your legs or feet.  You have trouble controlling your bladder or bowels.  You have nausea, vomiting, abdominal pain, shortness of breath, or fainting. Document Released: 03/17/2004 Document Revised: 05/02/2011 Document Reviewed: 01/22/2011 [facility] Patient Information 2014 [place], [place].  Back Injury Prevention Back injuries can be extremely painful and difficult to heal. After having one back injury, you are much more likely to experience another later on. It is important to learn how to avoid injuring or re-injuring your back. The following tips can help you to prevent a back injury. PHYSICAL FITNESS  Exercise regularly and try to develop good tone in your abdominal muscles. Your abdominal muscles provide a lot of the support needed by your back.  Do aerobic exercises (walking, jogging, biking, swimming) regularly.  Do exercises that increase balance and strength (tai  chi, yoga) regularly. This can decrease your risk of falling and injuring your back.  Stretch before and after exercising.  Maintain a healthy weight. The more you weigh, the more stress is placed on your back. For every pound of weight, 10 times that amount of pressure is placed on the back. DIET  Talk to your caregiver about how much calcium and vitamin D you need per day. These nutrients help to prevent weakening of the bones (osteoporosis). Osteoporosis can cause broken (fractured) bones that lead to back pain.  Include good sources of calcium in your diet, such as dairy products, green, leafy vegetables, and products with calcium added (fortified).  Include good sources of vitamin D in your diet, such as milk and foods that are fortified with vitamin D.  Consider taking a nutritional supplement or a multivitamin if needed.  Stop smoking if you smoke. POSTURE  Sit and stand up straight. Avoid leaning forward when you sit or hunching over when you stand.  Choose chairs with good low back (lumbar) support.  If you work at a desk, sit close to your work so you do not need to lean over. Keep your chin tucked in. Keep your neck drawn back and elbows bent at a right angle. Your arms should look like the letter "L."  Sit high and close to the steering wheel when you drive. Add a lumbar support to your car seat if needed.  Avoid sitting or standing in one position for too long. Take breaks to get up, stretch, and walk around at least once every hour. Take breaks if you are driving for long periods of time.  Sleep on your side with your knees slightly  bent, or sleep on your back with a pillow under your knees. Do not sleep on your stomach. LIFTING, TWISTING, AND REACHING  Avoid heavy lifting, especially repetitive lifting. If you must do heavy lifting:  Stretch before lifting.  Work slowly.  Rest between lifts.  Use carts and dollies to move objects when possible.  Make several  small trips instead of carrying 1 heavy load.  Ask for help when you need it.  Ask for help when moving big, awkward objects.  Follow these steps when lifting:  Stand with your feet shoulder-width apart.  Get as close to the object as you can. Do not try to pick up heavy objects that are far from your body.  Use handles or lifting straps if they are available.  Bend at your knees. Squat down, but keep your heels off the floor.  Keep your shoulders pulled back, your chin tucked in, and your back straight.  Lift the object slowly, tightening the muscles in your legs, abdomen, and buttocks. Keep the object as close to the center of your body as possible.  When you put a load down, use these same guidelines in reverse.  Do not:  Lift the object above your waist.  Twist at the waist while lifting or carrying a load. Move your feet if you need to turn, not your waist.  Bend over without bending at your knees.  Avoid reaching over your head, across a table, or for an object on a high surface. OTHER TIPS  Avoid wet floors and keep sidewalks clear of ice to prevent falls.  Do not sleep on a mattress that is too soft or too hard.  Keep items that are used frequently within easy reach.  Put heavier objects on shelves at waist level and lighter objects on lower or higher shelves.  Find ways to decrease your stress, such as exercise, massage, or relaxation techniques. Stress can build up in your muscles. Tense muscles are more vulnerable to injury.  Seek treatment for depression or anxiety if needed. These conditions can increase your risk of developing back pain. SEEK MEDICAL CARE IF:  You injure your back.  You have questions about diet, exercise, or other ways to prevent back injuries. MAKE SURE YOU:  Understand these instructions.  Will watch your condition.  Will get help right away if you are not doing well or get worse. Document Released: 03/17/2004 Document Revised:  05/02/2011 Document Reviewed: 03/21/2011 [facility] Patient Information 2014 [place], [place].

## 2013-04-14 NOTE — ED Provider Notes (Signed)
CSN: [id number]     Arrival date & time 04/14/13  1356 History  This chart was scribed for non-physician practitioner, Antonietta Breach, PA-C working with Blanchard Kelch, MD by Frederich Balding, ED scribe. This patient was seen in room TR06C/TR06C and the patient's care was started at 2:46 PM.   Chief Complaint  Patient presents with  . Back Pain   The history is provided by the patient. No language interpreter was used.   HPI Comments: Madeline Dickerson is a 53 y.o. female with history of chronic lower back pain who presents to the Emergency Department complaining of gradual onset, constant, stabbing upper back pain that started about 4 weeks ago after a fall. She states she slipped and tried to catch herself. Pt has taken ibuprofen, aleve and ibuprofen with some relief. Denies fever, hemoptysis, bowel or bladder incontinence, numbness or loss of sensation in upper extremities. Denies history of cancer or IV drug use. Pt has not been to her PCP or orthopedist for evaluation of these symptoms.   Past Medical History  Diagnosis Date  . Anxiety   . Hypertension   . Chronic low back pain 1991    disk s/p 4 diskectomies, 5th fursion, then spinal cord stimulator ([facility]ell)  . Bronchitis     2 weeks ago rx regime  . Depression   . GERD (gastroesophageal reflux disease)   . Arthritis   . Rotator cuff disorder    Past Surgical History  Procedure Laterality Date  . Spine surgery  7/ 12 last    5 previous  . Stimulator  12    spine,removed 7/12  . Cesarean section  84  . Lumbar fusion  04/27/2011   Family History  Problem Relation Age of Onset  . Stroke Father   . Hypertension Father    History  Substance Use Topics  . Smoking status: Current Every Day Smoker -- 1.00 packs/day for 30 years    Types: Cigarettes  . Smokeless tobacco: Never Used     Comment: prior trial of zyban made her feel weird   . Alcohol Use: No   OB History   Grav Para Term Preterm Abortions TAB SAB Ect Mult Living                  Review of Systems  Constitutional: Negative for fever.  Respiratory: Negative for cough.   Genitourinary:       Negative for bowel or bladder incontinence.  Musculoskeletal: Positive for back pain.  Neurological: Negative for numbness.  All other systems reviewed and are negative.   Allergies  Review of patient's allergies indicates no known allergies.  Home Medications   Current Outpatient Rx  Name  Route  Sig  Dispense  Refill  . albuterol (PROVENTIL HFA;VENTOLIN HFA) 108 (90 BASE) MCG/ACT inhaler   Inhalation   Inhale 2 puffs into the lungs every 6 (six) hours as needed for wheezing or shortness of breath.   1 Inhaler   2   . azithromycin (ZITHROMAX Z-PAK) 250 MG tablet      2 po day one, then 1 daily x 4 days   6 tablet   0   . cephALEXin (KEFLEX) 500 MG capsule   Oral   Take 1 capsule (500 mg total) by mouth 4 (four) times daily.   40 capsule   0   . FLUoxetine (PROZAC) 20 MG tablet   Oral   Take 60 mg by mouth daily.          Marland Kitchen  gabapentin (NEURONTIN) 400 MG capsule   Oral   Take 400 mg by mouth 4 (four) times daily.         Marland Kitchen guaiFENesin (MUCINEX) 600 MG 12 hr tablet   Oral   Take 600 mg by mouth 2 (two) times daily.         . lansoprazole (PREVACID) 30 MG capsule   Oral   Take 30 mg by mouth daily.          . promethazine (PHENERGAN) 25 MG tablet   Oral   Take 25 mg by mouth daily as needed for nausea.          . traMADol (ULTRAM) 50 MG tablet   Oral   Take 1 tablet (50 mg total) by mouth every 6 (six) hours as needed.   15 tablet   0   . valsartan-hydrochlorothiazide (DIOVAN-HCT) 160-12.5 MG per tablet   Oral   Take 1 tablet by mouth daily.          BP 137/72  Pulse 110  Temp(Src) 99.3 F (37.4 C) (Oral)  Resp 19  Wt 190 lb (86.183 kg)  SpO2 99%  Physical Exam  Nursing note and vitals reviewed. Constitutional: She is oriented to person, place, and time. She appears well-developed and well-nourished. No  distress.  HENT:  Head: Normocephalic and atraumatic.  Eyes: Conjunctivae and EOM are normal. No scleral icterus.  Neck: Normal range of motion.  Cardiovascular: Normal rate, regular rhythm, normal heart sounds and intact distal pulses.   Distal radial pulses 2+ bilaterally  Pulmonary/Chest: Effort normal and breath sounds normal. No respiratory distress. She has no wheezes. She has no rales.  No retractions or accessory muscle use. No wheezes or rales. Chest expansion symmetric.  Musculoskeletal: Normal range of motion. She exhibits tenderness (b/l thoracic paraspinal muslces).  Neurological: She is alert and oriented to person, place, and time.  GCS 15. Speech is goal oriented. Patient moves extremities without ataxia. No gross sensory deficits appreciated. Patient ambulates with normal gait.  Skin: Skin is warm and dry. No rash noted. She is not diaphoretic. No erythema. No pallor.  Psychiatric: She has a normal mood and affect. Her behavior is normal.    ED Course  Procedures (including critical care time)  DIAGNOSTIC STUDIES: Oxygen Saturation is 99% on RA, normal by my interpretation.    COORDINATION OF CARE: 2:51 PM-Discussed treatment plan which includes pain medication in the ED with pt at bedside and pt agreed to plan. Advised pt that she cannot keep coming to the ED for pain management and needs to follow up with the referrals she is given.   Labs Review Labs Reviewed - No data to display Imaging Review No results found.  EKG Interpretation   None       MDM   Final diagnoses:  Chronic back pain   53 year old female presents to the emergency department for thoracic back pain after "straining it" when "almost falling" 1 month ago. Patient well and nontoxic appearing, hemodynamically stable, and afebrile. No midline tenderness appreciated to the thoracic, lumbar, or sacral spine. No bony deformities or step-offs palpated. No spasms. No red flags or signs concerning  for cauda equina. Patient denies a history of cancer and IV drug use. Do not believe emergent work up is indicated today.  On chart review, this patient has been seen multiple times for pain complaints including back pain, most recently 1 month ago. I have explained to the patient that the emergency department  is not appropriate for the management of chronic pain. I have urged her to followup with her primary care doctor. I also advised referral to pain management. She verbalizes understanding. Patient stable and appropriate for discharge today with Mobic prescription. Return precautions provided and patient agreeable to plan with no unaddressed concerns.  I personally performed the services described in this documentation, which was scribed in my presence. The recorded information has been reviewed and is accurate.  Antonietta Breach, PA-C 04/14/13 1501

## 2013-04-15 NOTE — ED Provider Notes (Signed)
Medical screening examination/treatment/procedure(s) were performed by non-physician practitioner and as supervising physician I was immediately available for consultation/collaboration.  EKG Interpretation   None         Blanchard Kelch, MD 04/15/13 1203

## 2013-06-12 ENCOUNTER — Emergency Department (HOSPITAL_COMMUNITY)
Admission: EM | Admit: 2013-06-12 | Discharge: 2013-06-12 | Disposition: A | Discharge status: Home / Self Care | Payer: Medicare Other | Attending: Emergency Medicine | Admitting: Emergency Medicine

## 2013-06-12 ENCOUNTER — Encounter (HOSPITAL_COMMUNITY): Payer: Self-pay | Admitting: Emergency Medicine

## 2013-06-12 DIAGNOSIS — G8929 Other chronic pain: Secondary | ICD-10-CM | POA: Diagnosis not present

## 2013-06-12 DIAGNOSIS — F3289 Other specified depressive episodes: Secondary | ICD-10-CM | POA: Insufficient documentation

## 2013-06-12 DIAGNOSIS — F411 Generalized anxiety disorder: Secondary | ICD-10-CM | POA: Diagnosis not present

## 2013-06-12 DIAGNOSIS — Z9889 Other specified postprocedural states: Secondary | ICD-10-CM | POA: Insufficient documentation

## 2013-06-12 DIAGNOSIS — Z981 Arthrodesis status: Secondary | ICD-10-CM | POA: Insufficient documentation

## 2013-06-12 DIAGNOSIS — I10 Essential (primary) hypertension: Secondary | ICD-10-CM | POA: Diagnosis not present

## 2013-06-12 DIAGNOSIS — Z79899 Other long term (current) drug therapy: Secondary | ICD-10-CM | POA: Diagnosis not present

## 2013-06-12 DIAGNOSIS — F17200 Nicotine dependence, unspecified, uncomplicated: Secondary | ICD-10-CM | POA: Diagnosis not present

## 2013-06-12 DIAGNOSIS — F329 Major depressive disorder, single episode, unspecified: Secondary | ICD-10-CM | POA: Insufficient documentation

## 2013-06-12 DIAGNOSIS — M545 Low back pain, unspecified: Secondary | ICD-10-CM | POA: Insufficient documentation

## 2013-06-12 DIAGNOSIS — Z8739 Personal history of other diseases of the musculoskeletal system and connective tissue: Secondary | ICD-10-CM | POA: Insufficient documentation

## 2013-06-12 DIAGNOSIS — K219 Gastro-esophageal reflux disease without esophagitis: Secondary | ICD-10-CM | POA: Insufficient documentation

## 2013-06-12 DIAGNOSIS — Z8709 Personal history of other diseases of the respiratory system: Secondary | ICD-10-CM | POA: Insufficient documentation

## 2013-06-12 DIAGNOSIS — M549 Dorsalgia, unspecified: Secondary | ICD-10-CM

## 2013-06-12 MED ORDER — KETOROLAC TROMETHAMINE 60 MG/2ML IM SOLN
60.0000 mg | Freq: Once | INTRAMUSCULAR | Status: AC
  Administered 2013-06-12: 60 mg via INTRAMUSCULAR
  Filled 2013-06-12: qty 2

## 2013-06-12 MED ORDER — HYDROCODONE-ACETAMINOPHEN 5-325 MG PO TABS
1.0000 | ORAL_TABLET | Freq: Once | ORAL | Status: AC
  Administered 2013-06-12: 1 via ORAL
  Filled 2013-06-12: qty 1

## 2013-06-12 MED ORDER — HYDROCODONE-ACETAMINOPHEN 5-325 MG PO TABS: 1.0000 | ORAL_TABLET | Freq: Four times a day (QID) | ORAL | Status: DC | PRN

## 2013-06-12 NOTE — ED Notes (Signed)
Dr. Jodi Mourning at the bedside.

## 2013-06-12 NOTE — Discharge Instructions (Signed)
Back Exercises °Back exercises help treat and prevent back injuries. The goal of back exercises is to increase the strength of your abdominal and back muscles and the flexibility of your back. These exercises should be started when you no longer have back pain. Back exercises include: °· Pelvic Tilt. Lie on your back with your knees bent. Tilt your pelvis until the lower part of your back is against the floor. Hold this position 5 to 10 sec and repeat 5 to 10 times. °· Knee to Chest. Pull first 1 knee up against your chest and hold for 20 to 30 seconds, repeat this with the other knee, and then both knees. This may be done with the other leg straight or bent, whichever feels better. °· Sit-Ups or Curl-Ups. Bend your knees 90 degrees. Start with tilting your pelvis, and do a partial, slow sit-up, lifting your trunk only 30 to 45 degrees off the floor. Take at least 2 to 3 seconds for each sit-up. Do not do sit-ups with your knees out straight. If partial sit-ups are difficult, simply do the above but with only tightening your abdominal muscles and holding it as directed. °· Hip-Lift. Lie on your back with your knees flexed 90 degrees. Push down with your feet and shoulders as you raise your hips a couple inches off the floor; hold for 10 seconds, repeat 5 to 10 times. °· Back arches. Lie on your stomach, propping yourself up on bent elbows. Slowly press on your hands, causing an arch in your low back. Repeat 3 to 5 times. Any initial stiffness and discomfort should lessen with repetition over time. °· Shoulder-Lifts. Lie face down with arms beside your body. Keep hips and torso pressed to floor as you slowly lift your head and shoulders off the floor. °Do not overdo your exercises, especially in the beginning. Exercises may cause you some mild back discomfort which lasts for a few minutes; however, if the pain is more severe, or lasts for more than 15 minutes, do not continue exercises until you see your caregiver.  Improvement with exercise therapy for back problems is slow.  °See your caregivers for assistance with developing a proper back exercise program. °Document Released: 03/17/2004 Document Revised: 05/02/2011 Document Reviewed: 12/09/2010 °[employer]® Patient Information ©2014 [employer]. ° °Back Pain, Adult °Low back pain is very common. About 1 in 5 people have back pain. The cause of low back pain is rarely dangerous. The pain often gets better over time. About half of people with a sudden onset of back pain feel better in just 2 weeks. About 8 in 10 people feel better by 6 weeks.  °CAUSES °Some common causes of back pain include: °· Strain of the muscles or ligaments supporting the spine. °· Wear and tear (degeneration) of the spinal discs. °· Arthritis. °· Direct injury to the back. °DIAGNOSIS °Most of the time, the direct cause of low back pain is not known. However, back pain can be treated effectively even when the exact cause of the pain is unknown. Answering your caregiver's questions about your overall health and symptoms is one of the most accurate ways to make sure the cause of your pain is not dangerous. If your caregiver needs more information, he or she may order lab work or imaging tests (X-rays or MRIs). However, even if imaging tests show changes in your back, this usually does not require surgery. °HOME CARE INSTRUCTIONS °For many people, back pain returns. Since low back pain is rarely dangerous, it is often a condition that people   can learn to manage on their own.  °· Remain active. It is stressful on the back to sit or stand in one place. Do not sit, drive, or stand in one place for more than 30 minutes at a time. Take short walks on level surfaces as soon as pain allows. Try to increase the length of time you walk each day. °· Do not stay in bed. Resting more than 1 or 2 days can delay your recovery. °· Do not avoid exercise or work. Your body is made to move. It is not dangerous to be active,  even though your back may hurt. Your back will likely heal faster if you return to being active before your pain is gone. °· Pay attention to your body when you  bend and lift. Many people have less discomfort when lifting if they bend their knees, keep the load close to their bodies, and avoid twisting. Often, the most comfortable positions are those that put less stress on your recovering back. °· Find a comfortable position to sleep. Use a firm mattress and lie on your side with your knees slightly bent. If you lie on your back, put a pillow under your knees. °· Only take over-the-counter or prescription medicines as directed by your caregiver. Over-the-counter medicines to reduce pain and inflammation are often the most helpful. Your caregiver may prescribe muscle relaxant drugs. These medicines help dull your pain so you can more quickly return to your normal activities and healthy exercise. °· Put ice on the injured area. °· Put ice in a plastic bag. °· Place a towel between your skin and the bag. °· Leave the ice on for 15-20 minutes, 03-04 times a day for the first 2 to 3 days. After that, ice and heat may be alternated to reduce pain and spasms. °· Ask your caregiver about trying back exercises and gentle massage. This may be of some benefit. °· Avoid feeling anxious or stressed. Stress increases muscle tension and can worsen back pain. It is important to recognize when you are anxious or stressed and learn ways to manage it. Exercise is a great option. °SEEK MEDICAL CARE IF: °· You have pain that is not relieved with rest or medicine. °· You have pain that does not improve in 1 week. °· You have new symptoms. °· You are generally not feeling well. °SEEK IMMEDIATE MEDICAL CARE IF:  °· You have pain that radiates from your back into your legs. °· You develop new bowel or bladder control problems. °· You have unusual weakness or numbness in your arms or legs. °· You develop nausea or vomiting. °· You develop  abdominal pain. °· You feel faint. °Document Released: 02/07/2005 Document Revised: 08/09/2011 Document Reviewed: 06/28/2010 °[employer]® Patient Information ©2014 [employer]. ° °

## 2013-06-12 NOTE — ED Notes (Signed)
Pt reports that she has been out of bp medication x 2 weeks and feels like her pressure is high. Pt reports dull headache, and "pulsating vision" when she closes her eyes. Pt also reports chronic mid back pain. Hx of fusions before in past. Pt is ambulatory.

## 2013-06-12 NOTE — ED Provider Notes (Signed)
CSN: [id number]     Arrival date & time 06/12/13  1926 History   First MD Initiated Contact with Patient 06/12/13 2156     Chief Complaint  Patient presents with  . Hypertension     (Consider location/radiation/quality/duration/timing/severity/associated sxs/prior Treatment) HPI  This is a 53 y.o. female with PMH of anxiety, chronic low back pain, multiple visits to this department for said back pain, presenting with back pain. This is a chronic problem, but has become worse in the last few days. Located around T4. Persisted, throbbing. Alleviated with ibuprofen until today. Nonradiating. Negative for weakness, numbness, tingling.  Past Medical History  Diagnosis Date  . Anxiety   . Hypertension   . Chronic low back pain 1991    disk s/p 4 diskectomies, 5th fursion, then spinal cord stimulator ([facility])  . Bronchitis     2 weeks ago rx regime  . Depression   . GERD (gastroesophageal reflux disease)   . Arthritis   . Rotator cuff disorder    Past Surgical History  Procedure Laterality Date  . Spine surgery  7/ 12 last    5 previous  . Stimulator  12    spine,removed 7/12  . Cesarean section  84  . Lumbar fusion  04/27/2011   Family History  Problem Relation Age of Onset  . Stroke Father   . Hypertension Father    History  Substance Use Topics  . Smoking status: Current Every Day Smoker -- 1.00 packs/day for 30 years    Types: Cigarettes  . Smokeless tobacco: Never Used     Comment: prior trial of zyban made her feel weird   . Alcohol Use: No   OB History   Grav Para Term Preterm Abortions TAB SAB Ect Mult Living                 Review of Systems  Constitutional: Negative for fever and chills.  HENT: Negative for facial swelling.   Eyes: Negative for photophobia and pain.  Respiratory: Negative for cough and shortness of breath.   Cardiovascular: Negative for chest pain and leg swelling.  Gastrointestinal: Negative for nausea, vomiting and abdominal pain.   Genitourinary: Negative for dysuria.  Musculoskeletal: Positive for back pain. Negative for arthralgias.  Skin: Negative for rash and wound.  Neurological: Negative for seizures.  Hematological: Negative for adenopathy.      Allergies  Review of patient's allergies indicates no known allergies.  Home Medications   Prior to Admission medications   Medication Sig Start Date End Date Taking? Authorizing Provider  albuterol (PROVENTIL HFA;VENTOLIN HFA) 108 (90 BASE) MCG/ACT inhaler Inhale 2 puffs into the lungs every 6 (six) hours as needed for wheezing or shortness of breath. 02/06/13  Yes Corky Sox, MD  carisoprodol (SOMA) 350 MG tablet Take 350 mg by mouth 4 (four) times daily as needed for muscle spasms.   Yes Historical Provider, MD  FLUoxetine (PROZAC) 20 MG tablet Take 60 mg by mouth daily.    Yes Historical Provider, MD  gabapentin (NEURONTIN) 400 MG capsule Take 400 mg by mouth 4 (four) times daily.   Yes Historical Provider, MD  lansoprazole (PREVACID) 30 MG capsule Take 30 mg by mouth daily.    Yes Historical Provider, MD  valsartan-hydrochlorothiazide (DIOVAN-HCT) 160-12.5 MG per tablet Take 1 tablet by mouth daily. 01/04/13   Historical Provider, MD   BP 138/99  Pulse 94  Temp(Src) 98.2 F (36.8 C) (Oral)  Ht 5\' 7"  (1.702 m)  Wt  190 lb (86.183 kg)  BMI 29.75 kg/m2  SpO2 100% Physical Exam  Constitutional: She is oriented to person, place, and time. She appears well-developed and well-nourished. No distress.  HENT:  Head: Normocephalic and atraumatic.  Mouth/Throat: No oropharyngeal exudate.  Eyes: Conjunctivae are normal. Pupils are equal, round, and reactive to light. No scleral icterus.  Neck: Normal range of motion. No tracheal deviation present. No thyromegaly present.  Cardiovascular: Normal rate, regular rhythm and normal heart sounds.  Exam reveals no gallop and no friction rub.   No murmur heard. Pulmonary/Chest: Effort normal and breath sounds normal. No  stridor. No respiratory distress. She has no wheezes. She has no rales. She exhibits no tenderness.  Abdominal: Soft. She exhibits no distension and no mass. There is no tenderness. There is no rebound and no guarding.  Musculoskeletal: Normal range of motion. She exhibits no edema.  Positive for paraspinal TTP at around T4  Neurological: She is alert and oriented to person, place, and time. She has normal strength. No cranial nerve deficit or sensory deficit. Coordination and gait normal. GCS eye subscore is 4. GCS verbal subscore is 5. GCS motor subscore is 6.  Reflex Scores:      Patellar reflexes are 2+ on the right side and 2+ on the left side. Skin: Skin is warm and dry. She is not diaphoretic.    ED Course  Procedures (including critical care time)  MDM   Final diagnoses:  None    This is a 53 y.o. female with PMH of anxiety, chronic low back pain, multiple visits to this department for said back pain, presenting with back pain. This is a chronic problem, but has become worse in the last few days. Located around T4. Persisted, throbbing. Alleviated with ibuprofen until today. Nonradiating. Negative for weakness, numbness, tingling.  Exam as above. Blood pressure is 138/99, not concerning. Remainder of vital signs are within normal limits. This is a chronic complaint. I do not believe it represents cauda equina, epidural abscess, fracture, spinal compression, aorta pathology. I have administered Toradol, Norco. Patient states the pain has been alleviated.  I've encouraged patient to followup with primary care physician, establish care with pain clinic, as this is a chronic problem. Pt stable for discharge, FU.  All questions answered.  Return precautions given.  I have discussed case and care has been guided by my attending physician, Dr. Venora Maples.   Doy Hutching, MD 06/13/13 [phone]

## 2013-06-15 NOTE — ED Provider Notes (Signed)
I saw and evaluated the patient, reviewed the resident's note and I agree with the findings and plan.   EKG Interpretation None      Acute exacerbation of chronic back pain. Normal neuro exam  Hoy Morn, MD 06/15/13 [phone]

## 2013-07-09 DIAGNOSIS — M19049 Primary osteoarthritis, unspecified hand: Secondary | ICD-10-CM | POA: Diagnosis not present

## 2013-07-09 DIAGNOSIS — M67919 Unspecified disorder of synovium and tendon, unspecified shoulder: Secondary | ICD-10-CM | POA: Diagnosis not present

## 2013-08-13 ENCOUNTER — Emergency Department (HOSPITAL_COMMUNITY)
Admission: EM | Admit: 2013-08-13 | Discharge: 2013-08-13 | Disposition: A | Discharge status: Home / Self Care | Payer: Medicare Other | Attending: Emergency Medicine | Admitting: Emergency Medicine

## 2013-08-13 ENCOUNTER — Encounter (HOSPITAL_COMMUNITY): Payer: Self-pay | Admitting: Emergency Medicine

## 2013-08-13 DIAGNOSIS — G8929 Other chronic pain: Secondary | ICD-10-CM | POA: Diagnosis not present

## 2013-08-13 DIAGNOSIS — K219 Gastro-esophageal reflux disease without esophagitis: Secondary | ICD-10-CM | POA: Insufficient documentation

## 2013-08-13 DIAGNOSIS — Z8709 Personal history of other diseases of the respiratory system: Secondary | ICD-10-CM | POA: Insufficient documentation

## 2013-08-13 DIAGNOSIS — F17200 Nicotine dependence, unspecified, uncomplicated: Secondary | ICD-10-CM | POA: Insufficient documentation

## 2013-08-13 DIAGNOSIS — M129 Arthropathy, unspecified: Secondary | ICD-10-CM | POA: Diagnosis not present

## 2013-08-13 DIAGNOSIS — Z79899 Other long term (current) drug therapy: Secondary | ICD-10-CM | POA: Diagnosis not present

## 2013-08-13 DIAGNOSIS — R112 Nausea with vomiting, unspecified: Secondary | ICD-10-CM

## 2013-08-13 DIAGNOSIS — M545 Low back pain, unspecified: Secondary | ICD-10-CM | POA: Diagnosis not present

## 2013-08-13 DIAGNOSIS — R51 Headache: Secondary | ICD-10-CM | POA: Insufficient documentation

## 2013-08-13 DIAGNOSIS — F329 Major depressive disorder, single episode, unspecified: Secondary | ICD-10-CM | POA: Diagnosis not present

## 2013-08-13 DIAGNOSIS — F411 Generalized anxiety disorder: Secondary | ICD-10-CM | POA: Diagnosis not present

## 2013-08-13 DIAGNOSIS — R1110 Vomiting, unspecified: Secondary | ICD-10-CM | POA: Diagnosis present

## 2013-08-13 DIAGNOSIS — E860 Dehydration: Secondary | ICD-10-CM | POA: Diagnosis not present

## 2013-08-13 DIAGNOSIS — R519 Headache, unspecified: Secondary | ICD-10-CM

## 2013-08-13 DIAGNOSIS — F3289 Other specified depressive episodes: Secondary | ICD-10-CM | POA: Insufficient documentation

## 2013-08-13 DIAGNOSIS — I10 Essential (primary) hypertension: Secondary | ICD-10-CM | POA: Diagnosis not present

## 2013-08-13 DIAGNOSIS — Z791 Long term (current) use of non-steroidal anti-inflammatories (NSAID): Secondary | ICD-10-CM | POA: Diagnosis not present

## 2013-08-13 LAB — CBC WITH DIFFERENTIAL/PLATELET
Basophils Absolute: 0 10*3/uL (ref 0.0–0.1)
Basophils Relative: 0 % (ref 0–1)
Eosinophils Absolute: 0.1 10*3/uL (ref 0.0–0.7)
Eosinophils Relative: 1 % (ref 0–5)
HCT: 40.3 % (ref 36.0–46.0)
Hemoglobin: 13.7 g/dL (ref 12.0–15.0)
Lymphocytes Relative: 10 % — ABNORMAL LOW (ref 12–46)
Lymphs Abs: 1.4 10*3/uL (ref 0.7–4.0)
MCH: 30.9 pg (ref 26.0–34.0)
MCHC: 34 g/dL (ref 30.0–36.0)
MCV: 91 fL (ref 78.0–100.0)
Monocytes Absolute: 0.8 10*3/uL (ref 0.1–1.0)
Monocytes Relative: 5 % (ref 3–12)
Neutro Abs: 12.3 10*3/uL — ABNORMAL HIGH (ref 1.7–7.7)
Neutrophils Relative %: 84 % — ABNORMAL HIGH (ref 43–77)
Platelets: 324 10*3/uL (ref 150–400)
RBC: 4.43 MIL/uL (ref 3.87–5.11)
RDW: 14.4 % (ref 11.5–15.5)
WBC: 14.6 10*3/uL — ABNORMAL HIGH (ref 4.0–10.5)

## 2013-08-13 LAB — LIPASE, BLOOD: Lipase: 21 U/L (ref 11–59)

## 2013-08-13 LAB — COMPREHENSIVE METABOLIC PANEL
ALT: 15 U/L (ref 0–35)
AST: 18 U/L (ref 0–37)
Albumin: 3.7 g/dL (ref 3.5–5.2)
Alkaline Phosphatase: 88 U/L (ref 39–117)
BUN: 9 mg/dL (ref 6–23)
CO2: 18 mEq/L — ABNORMAL LOW (ref 19–32)
Calcium: 9.4 mg/dL (ref 8.4–10.5)
Chloride: 100 mEq/L (ref 96–112)
Creatinine, Ser: 0.56 mg/dL (ref 0.50–1.10)
GFR calc Af Amer: >90 mL/min (ref >90)
GFR calc non Af Amer: >90 mL/min (ref >90)
Glucose, Bld: 98 mg/dL (ref 70–99)
Potassium: 4 mEq/L (ref 3.7–5.3)
Sodium: 138 mEq/L (ref 137–147)
Total Bilirubin: 0.2 mg/dL — ABNORMAL LOW (ref 0.3–1.2)
Total Protein: 7.8 g/dL (ref 6.0–8.3)

## 2013-08-13 LAB — URINALYSIS, ROUTINE W REFLEX MICROSCOPIC
Bilirubin Urine: NEGATIVE
Glucose, UA: NEGATIVE mg/dL
Ketones, ur: NEGATIVE mg/dL
Leukocytes, UA: NEGATIVE
Nitrite: NEGATIVE
Protein, ur: NEGATIVE mg/dL
Specific Gravity, Urine: 1.015 (ref 1.005–1.030)
Urobilinogen, UA: 0.2 mg/dL (ref 0.0–1.0)
pH: 6 (ref 5.0–8.0)

## 2013-08-13 LAB — URINE MICROSCOPIC-ADD ON

## 2013-08-13 MED ORDER — ONDANSETRON 4 MG PO TBDP
8.0000 mg | ORAL_TABLET | Freq: Once | ORAL | Status: AC
Start: 2013-08-13 — End: 2013-08-13
  Administered 2013-08-13: 8 mg via ORAL
  Filled 2013-08-13: qty 2

## 2013-08-13 MED ORDER — TRAMADOL HCL 50 MG PO TABS
50.0000 mg | ORAL_TABLET | Freq: Three times a day (TID) | ORAL | Status: DC | PRN
Start: 2013-08-13 — End: 2014-08-12

## 2013-08-13 MED ORDER — SODIUM CHLORIDE 0.9 % IV SOLN
1000.0000 mL | Freq: Once | INTRAVENOUS | Status: AC
  Administered 2013-08-13: 1000 mL via INTRAVENOUS

## 2013-08-13 MED ORDER — PROCHLORPERAZINE EDISYLATE 5 MG/ML IJ SOLN
10.0000 mg | Freq: Once | INTRAMUSCULAR | Status: AC
  Administered 2013-08-13: 10 mg via INTRAVENOUS
  Filled 2013-08-13: qty 2

## 2013-08-13 MED ORDER — VALSARTAN-HYDROCHLOROTHIAZIDE 160-12.5 MG PO TABS: 1.0000 | ORAL_TABLET | Freq: Every day | ORAL | Status: DC

## 2013-08-13 MED ORDER — SODIUM CHLORIDE 0.9 % IV SOLN: 1000.0000 mL | INTRAVENOUS | Status: DC

## 2013-08-13 MED ORDER — PROMETHAZINE HCL 25 MG PO TABS: 25.0000 mg | ORAL_TABLET | Freq: Four times a day (QID) | ORAL | Status: DC | PRN

## 2013-08-13 MED ORDER — HYDROMORPHONE HCL PF 1 MG/ML IJ SOLN
1.0000 mg | INTRAMUSCULAR | Status: DC | PRN
  Administered 2013-08-13: 1 mg via INTRAVENOUS
  Filled 2013-08-13: qty 1

## 2013-08-13 NOTE — ED Provider Notes (Signed)
CSN: [id number]     Arrival date & time 08/13/13  1020 History   First MD Initiated Contact with Patient 08/13/13 1129     Chief Complaint  Patient presents with  . Emesis    HPI Pt started having trouble with headache and nausea last night.  She has a history of chronic nausea and takes phenergan.  She ran out of her prescription about a week ago.  Last night she started vomiting multiple times.  She feels like she is dehydrated now and that has caused her headache. The headache started after she had been vomiting so much. She took some Zofran and her nausea is better.  No abdominal pain.  No diarrhea or fever.  No neck pain.  No injuries to her head.  She also exacerbated her chronic back pain from jerking when she was vomiting. Past Medical History  Diagnosis Date  . Anxiety   . Hypertension   . Chronic low back pain 1991    disk s/p 4 diskectomies, 5th fursion, then spinal cord stimulator ([facility])  . Bronchitis     2 weeks ago rx regime  . Depression   . GERD (gastroesophageal reflux disease)   . Arthritis   . Rotator cuff disorder    Past Surgical History  Procedure Laterality Date  . Spine surgery  7/ 12 last    5 previous  . Stimulator  12    spine,removed 7/12  . Cesarean section  84  . Lumbar fusion  04/27/2011   Family History  Problem Relation Age of Onset  . Stroke Father   . Hypertension Father    History  Substance Use Topics  . Smoking status: Current Every Day Smoker -- 1.00 packs/day for 30 years    Types: Cigarettes  . Smokeless tobacco: Never Used     Comment: prior trial of zyban made her feel weird   . Alcohol Use: No   OB History   Grav Para Term Preterm Abortions TAB SAB Ect Mult Living                 Review of Systems  All other systems reviewed and are negative.     Allergies  Review of patient's allergies indicates no known allergies.  Home Medications   Prior to Admission medications   Medication Sig Start Date End Date  Taking? Authorizing Provider  FLUoxetine (PROZAC) 20 MG tablet Take 60 mg by mouth daily.    Yes Historical Provider, MD  gabapentin (NEURONTIN) 400 MG capsule Take 400 mg by mouth 4 (four) times daily.   Yes Historical Provider, MD  lansoprazole (PREVACID) 30 MG capsule Take 30 mg by mouth daily.    Yes Historical Provider, MD  naproxen (NAPROSYN) 500 MG tablet Take 500 mg by mouth 2 (two) times daily as needed (pain).  07/09/13  Yes Historical Provider, MD  promethazine (PHENERGAN) 25 MG tablet Take 1 tablet (25 mg total) by mouth every 6 (six) hours as needed for nausea or vomiting. 08/13/13   Dorie Rank, MD  traMADol (ULTRAM) 50 MG tablet Take 1 tablet (50 mg total) by mouth 3 (three) times daily as needed (pain). 08/13/13   Dorie Rank, MD  valsartan-hydrochlorothiazide (DIOVAN HCT) 160-12.5 MG per tablet Take 1 tablet by mouth daily. 08/13/13   Dorie Rank, MD   BP 141/84  Pulse 78  Temp(Src) 98.3 F (36.8 C) (Oral)  Resp 18  Ht 5\' 7"  (1.702 m)  Wt 191 lb 1.6 oz (86.682 kg)  BMI 29.92 kg/m2  SpO2 95% Physical Exam  Nursing note and vitals reviewed. Constitutional: She appears well-developed and well-nourished. No distress.  Jovial, laughing   HENT:  Head: Normocephalic and atraumatic.  Right Ear: External ear normal.  Left Ear: External ear normal.  Eyes: Conjunctivae and EOM are normal. Right eye exhibits no discharge. Left eye exhibits no discharge. No scleral icterus.  Neck: Normal range of motion. Neck supple. No tracheal deviation present.  Cardiovascular: Normal rate, regular rhythm and intact distal pulses.   Pulmonary/Chest: Effort normal and breath sounds normal. No stridor. No respiratory distress. She has no wheezes. She has no rales.  Abdominal: Soft. Bowel sounds are normal. She exhibits no distension. There is tenderness. There is no rebound and no guarding.  Epigastric ttp  Musculoskeletal: She exhibits no edema and no tenderness.  Neurological: She is alert. She has  normal strength. No cranial nerve deficit (no facial droop, extraocular movements intact, no slurred speech) or sensory deficit. She exhibits normal muscle tone. She displays no seizure activity. Coordination normal.  Skin: Skin is warm and dry. No rash noted.  Psychiatric: She has a normal mood and affect.    ED Course  Procedures (including critical care time) Labs Review Labs Reviewed  CBC WITH DIFFERENTIAL - Abnormal; Notable for the following:    WBC 14.6 (*)    Neutrophils Relative % 84 (*)    Neutro Abs 12.3 (*)    Lymphocytes Relative 10 (*)    All other components within normal limits  COMPREHENSIVE METABOLIC PANEL - Abnormal; Notable for the following:    CO2 18 (*)    Total Bilirubin 0.2 (*)    All other components within normal limits  URINALYSIS, ROUTINE W REFLEX MICROSCOPIC - Abnormal; Notable for the following:    Hgb urine dipstick SMALL (*)    All other components within normal limits  URINE MICROSCOPIC-ADD ON - Abnormal; Notable for the following:    Squamous Epithelial / LPF FEW (*)    All other components within normal limits  LIPASE, BLOOD     MDM   Final diagnoses:  Dehydration  Non-intractable vomiting with nausea, vomiting of unspecified type  Nonintractable headache, unspecified chronicity pattern, unspecified headache type    Pt improved with treatment in the ED.  Decreased bicarb likely related to her dehydration, n/v.  No further vomiting in the ED.  Pt has a history of recurrent episodes similar to this.  Doubt acute infection at this time.  No focal neuro deficits with headache.  No acute onset.  Pt has had headaches in the past requiring ED treatment per records.  Could have a component of migraine headache.  Pt requested refill of tramadol and bp medicine.   Dorie Rank, MD 08/13/13 [phone]

## 2013-08-13 NOTE — ED Notes (Signed)
MD Knapp at bedside 

## 2013-08-13 NOTE — ED Notes (Signed)
She states she has chronic nausea and takes phenergan daily. She ran out last week and has been vomiting every day since. She can not get her doctor to call her back. She states she feels dehydrated and achy all over now

## 2013-08-13 NOTE — ED Notes (Signed)
Pt actively drinking diet coke. Denies any nausea. AO x4. VSS.

## 2013-08-13 NOTE — Discharge Instructions (Signed)
Dehydration, Adult Dehydration is when you lose more fluids from the body than you take in. Vital organs like the kidneys, brain, and heart cannot function without a proper amount of fluids and salt. Any loss of fluids from the body can cause dehydration.  CAUSES   Vomiting.  Diarrhea.  Excessive sweating.  Excessive urine output.  Fever. SYMPTOMS  Mild dehydration  Thirst.  Dry lips.  Slightly dry mouth. Moderate dehydration  Very dry mouth.  Sunken eyes.  Skin does not bounce back quickly when lightly pinched and released.  Dark urine and decreased urine production.  Decreased tear production.  Headache. Severe dehydration  Very dry mouth.  Extreme thirst.  Rapid, weak pulse (more than 100 beats per minute at rest).  Cold hands and feet.  Not able to sweat in spite of heat and temperature.  Rapid breathing.  Blue lips.  Confusion and lethargy.  Difficulty being awakened.  Minimal urine production.  No tears. DIAGNOSIS  Your caregiver will diagnose dehydration based on your symptoms and your exam. Blood and urine tests will help confirm the diagnosis. The diagnostic evaluation should also identify the cause of dehydration. TREATMENT  Treatment of mild or moderate dehydration can often be done at home by increasing the amount of fluids that you drink. It is best to drink small amounts of fluid more often. Drinking too much at one time can make vomiting worse. Refer to the home care instructions below. Severe dehydration needs to be treated at the hospital where you will probably be given intravenous (IV) fluids that contain water and electrolytes. HOME CARE INSTRUCTIONS   Ask your caregiver about specific rehydration instructions.  Drink enough fluids to keep your urine clear or pale yellow.  Drink small amounts frequently if you have nausea and vomiting.  Eat as you normally do.  Avoid:  Foods or drinks high in sugar.  Carbonated  drinks.  Juice.  Extremely hot or cold fluids.  Drinks with caffeine.  Fatty, greasy foods.  Alcohol.  Tobacco.  Overeating.  Gelatin desserts.  Wash your hands well to avoid spreading bacteria and viruses.  Only take over-the-counter or prescription medicines for pain, discomfort, or fever as directed by your caregiver.  Ask your caregiver if you should continue all prescribed and over-the-counter medicines.  Keep all follow-up appointments with your caregiver. SEEK MEDICAL CARE IF:  You have abdominal pain and it increases or stays in one area (localizes).  You have a rash, stiff neck, or severe headache.  You are irritable, sleepy, or difficult to awaken.  You are weak, dizzy, or extremely thirsty. SEEK IMMEDIATE MEDICAL CARE IF:   You are unable to keep fluids down or you get worse despite treatment.  You have frequent episodes of vomiting or diarrhea.  You have blood or green matter (bile) in your vomit.  You have blood in your stool or your stool looks black and tarry.  You have not urinated in 6 to 8 hours, or you have only urinated a small amount of very dark urine.  You have a fever.  You faint. MAKE SURE YOU:   Understand these instructions.  Will watch your condition.  Will get help right away if you are not doing well or get worse. Document Released: 02/07/2005 Document Revised: 05/02/2011 Document Reviewed: 09/27/2010 [employer] Patient Information 2015 [employer]. This information is not intended to replace advice given to you by your health care provider. Make sure you discuss any questions you have with your health care   provider.  

## 2013-08-22 DIAGNOSIS — F17200 Nicotine dependence, unspecified, uncomplicated: Secondary | ICD-10-CM | POA: Diagnosis not present

## 2013-08-22 DIAGNOSIS — G8929 Other chronic pain: Secondary | ICD-10-CM | POA: Diagnosis not present

## 2013-08-22 DIAGNOSIS — I10 Essential (primary) hypertension: Secondary | ICD-10-CM | POA: Diagnosis not present

## 2013-08-22 DIAGNOSIS — M545 Low back pain, unspecified: Secondary | ICD-10-CM | POA: Diagnosis not present

## 2013-08-22 DIAGNOSIS — E669 Obesity, unspecified: Secondary | ICD-10-CM | POA: Diagnosis not present

## 2013-08-22 DIAGNOSIS — Z79899 Other long term (current) drug therapy: Secondary | ICD-10-CM | POA: Diagnosis not present

## 2013-08-22 DIAGNOSIS — M549 Dorsalgia, unspecified: Secondary | ICD-10-CM | POA: Diagnosis not present

## 2013-09-08 ENCOUNTER — Emergency Department (HOSPITAL_COMMUNITY): Payer: Medicare Other

## 2013-09-08 ENCOUNTER — Emergency Department (HOSPITAL_COMMUNITY)
Admission: EM | Admit: 2013-09-08 | Discharge: 2013-09-08 | Disposition: A | Discharge status: Home / Self Care | Payer: Medicare Other | Attending: Emergency Medicine | Admitting: Emergency Medicine

## 2013-09-08 ENCOUNTER — Encounter (HOSPITAL_COMMUNITY): Payer: Self-pay | Admitting: Emergency Medicine

## 2013-09-08 DIAGNOSIS — Y929 Unspecified place or not applicable: Secondary | ICD-10-CM | POA: Diagnosis not present

## 2013-09-08 DIAGNOSIS — S59909A Unspecified injury of unspecified elbow, initial encounter: Secondary | ICD-10-CM | POA: Diagnosis not present

## 2013-09-08 DIAGNOSIS — S6990XA Unspecified injury of unspecified wrist, hand and finger(s), initial encounter: Secondary | ICD-10-CM

## 2013-09-08 DIAGNOSIS — M129 Arthropathy, unspecified: Secondary | ICD-10-CM | POA: Diagnosis not present

## 2013-09-08 DIAGNOSIS — F3289 Other specified depressive episodes: Secondary | ICD-10-CM | POA: Insufficient documentation

## 2013-09-08 DIAGNOSIS — F329 Major depressive disorder, single episode, unspecified: Secondary | ICD-10-CM | POA: Diagnosis not present

## 2013-09-08 DIAGNOSIS — M79609 Pain in unspecified limb: Secondary | ICD-10-CM | POA: Diagnosis not present

## 2013-09-08 DIAGNOSIS — F17200 Nicotine dependence, unspecified, uncomplicated: Secondary | ICD-10-CM | POA: Diagnosis not present

## 2013-09-08 DIAGNOSIS — F411 Generalized anxiety disorder: Secondary | ICD-10-CM | POA: Insufficient documentation

## 2013-09-08 DIAGNOSIS — R059 Cough, unspecified: Secondary | ICD-10-CM | POA: Diagnosis present

## 2013-09-08 DIAGNOSIS — I10 Essential (primary) hypertension: Secondary | ICD-10-CM | POA: Diagnosis not present

## 2013-09-08 DIAGNOSIS — S59919A Unspecified injury of unspecified forearm, initial encounter: Secondary | ICD-10-CM | POA: Diagnosis not present

## 2013-09-08 DIAGNOSIS — R05 Cough: Secondary | ICD-10-CM | POA: Diagnosis not present

## 2013-09-08 DIAGNOSIS — Z79899 Other long term (current) drug therapy: Secondary | ICD-10-CM | POA: Insufficient documentation

## 2013-09-08 DIAGNOSIS — Y9389 Activity, other specified: Secondary | ICD-10-CM | POA: Diagnosis not present

## 2013-09-08 DIAGNOSIS — K219 Gastro-esophageal reflux disease without esophagitis: Secondary | ICD-10-CM | POA: Diagnosis not present

## 2013-09-08 DIAGNOSIS — IMO0002 Reserved for concepts with insufficient information to code with codable children: Secondary | ICD-10-CM | POA: Diagnosis not present

## 2013-09-08 DIAGNOSIS — G8929 Other chronic pain: Secondary | ICD-10-CM | POA: Insufficient documentation

## 2013-09-08 DIAGNOSIS — M25539 Pain in unspecified wrist: Secondary | ICD-10-CM | POA: Diagnosis not present

## 2013-09-08 DIAGNOSIS — S60229A Contusion of unspecified hand, initial encounter: Secondary | ICD-10-CM | POA: Diagnosis not present

## 2013-09-08 DIAGNOSIS — J45901 Unspecified asthma with (acute) exacerbation: Secondary | ICD-10-CM | POA: Diagnosis not present

## 2013-09-08 DIAGNOSIS — M25531 Pain in right wrist: Secondary | ICD-10-CM

## 2013-09-08 LAB — I-STAT CHEM 8, ED
BUN: 9 mg/dL (ref 6–23)
Calcium, Ion: 1.22 mmol/L (ref 1.12–1.23)
Chloride: 105 mEq/L (ref 96–112)
Creatinine, Ser: 0.9 mg/dL (ref 0.50–1.10)
Glucose, Bld: 193 mg/dL — ABNORMAL HIGH (ref 70–99)
HCT: 45 % (ref 36.0–46.0)
Hemoglobin: 15.3 g/dL — ABNORMAL HIGH (ref 12.0–15.0)
Potassium: 3.2 mEq/L — ABNORMAL LOW (ref 3.7–5.3)
Sodium: 141 mEq/L (ref 137–147)
TCO2: 20 mmol/L (ref 0–100)

## 2013-09-08 MED ORDER — DEXAMETHASONE SODIUM PHOSPHATE 10 MG/ML IJ SOLN: 10.0000 mg | Freq: Once | INTRAMUSCULAR | Status: DC

## 2013-09-08 MED ORDER — PREDNISONE 20 MG PO TABS
40.0000 mg | ORAL_TABLET | Freq: Every day | ORAL | Status: DC
Start: 2013-09-08 — End: 2014-08-12

## 2013-09-08 MED ORDER — ALBUTEROL SULFATE HFA 108 (90 BASE) MCG/ACT IN AERS: 2.0000 | INHALATION_SPRAY | RESPIRATORY_TRACT | Status: DC | PRN

## 2013-09-08 MED ORDER — HYDROMORPHONE HCL PF 1 MG/ML IJ SOLN
2.0000 mg | Freq: Once | INTRAMUSCULAR | Status: AC
  Administered 2013-09-08: 2 mg via INTRAMUSCULAR

## 2013-09-08 MED ORDER — IPRATROPIUM BROMIDE 0.02 % IN SOLN
RESPIRATORY_TRACT | Status: AC
  Filled 2013-09-08: qty 2.5

## 2013-09-08 MED ORDER — ALBUTEROL (5 MG/ML) CONTINUOUS INHALATION SOLN
10.0000 mg/h | INHALATION_SOLUTION | RESPIRATORY_TRACT | Status: AC
  Administered 2013-09-08: 10 mg/h via RESPIRATORY_TRACT
  Filled 2013-09-08: qty 20

## 2013-09-08 MED ORDER — POTASSIUM CHLORIDE CRYS ER 20 MEQ PO TBCR
40.0000 meq | EXTENDED_RELEASE_TABLET | Freq: Once | ORAL | Status: AC
  Administered 2013-09-08: 40 meq via ORAL
  Filled 2013-09-08: qty 2

## 2013-09-08 MED ORDER — HYDROCODONE-ACETAMINOPHEN 5-325 MG PO TABS: 1.0000 | ORAL_TABLET | Freq: Four times a day (QID) | ORAL | Status: DC | PRN

## 2013-09-08 MED ORDER — METHYLPREDNISOLONE SODIUM SUCC 125 MG IJ SOLR
125.0000 mg | Freq: Once | INTRAMUSCULAR | Status: DC
  Filled 2013-09-08: qty 2

## 2013-09-08 MED ORDER — OXYCODONE-ACETAMINOPHEN 5-325 MG PO TABS
2.0000 | ORAL_TABLET | Freq: Once | ORAL | Status: AC
  Administered 2013-09-08: 2 via ORAL
  Filled 2013-09-08: qty 2

## 2013-09-08 MED ORDER — IPRATROPIUM BROMIDE 0.02 % IN SOLN
0.5000 mg | Freq: Once | RESPIRATORY_TRACT | Status: AC
  Administered 2013-09-08: 0.5 mg via RESPIRATORY_TRACT
  Filled 2013-09-08: qty 2.5

## 2013-09-08 MED ORDER — METHYLPREDNISOLONE SODIUM SUCC 125 MG IJ SOLR
125.0000 mg | Freq: Once | INTRAMUSCULAR | Status: AC
  Administered 2013-09-08: 125 mg via INTRAMUSCULAR

## 2013-09-08 MED ORDER — HYDROMORPHONE HCL PF 1 MG/ML IJ SOLN
2.0000 mg | Freq: Once | INTRAMUSCULAR | Status: DC
Start: 2013-09-08 — End: 2013-09-08
  Filled 2013-09-08 (×2): qty 2

## 2013-09-08 MED ORDER — ALBUTEROL SULFATE (2.5 MG/3ML) 0.083% IN NEBU
5.0000 mg | INHALATION_SOLUTION | Freq: Once | RESPIRATORY_TRACT | Status: AC
  Administered 2013-09-08: 5 mg via RESPIRATORY_TRACT
  Filled 2013-09-08: qty 6

## 2013-09-08 NOTE — ED Provider Notes (Signed)
CSN: [id number]     Arrival date & time 09/08/13  1106 History  This chart was scribed for Madeline Mail, PA-C, non-physician practitioner working with Jasper Riling. Alvino Chapel, MD by Vernell Barrier, ED scribe. This patient was seen in room TR10C/TR10C and the patient's care was started at 2:01 PM.    Chief Complaint  Patient presents with  . Cough  . Hand Pain   The history is provided by the patient. No language interpreter was used.   HPI Comments: Madeline Dickerson is a 53 y.o. female w/ hx of seasonal allergies presents to the Emergency Department with 2 complaints. Reports non-productive cough onset last night. States it has been improving since arriving in the ED. Has had a chest cold for the past couple of weeks. The air in her house is off so believes that may have contributed to cough.  Also reports stinging, burning, aching, intermittently throbbing left hand pain w/ associated swelling; onset 2 weeks ago. Some decreased ROM. States she hit against something. No prior injury to the left hand. Says it appeared as if it were getting better initially but pain soon returned and has not subsided since. Denies numbness or tingling.   Past Medical History  Diagnosis Date  . Anxiety   . Hypertension   . Chronic low back pain 1991    disk s/p 4 diskectomies, 5th fursion, then spinal cord stimulator ([facility])  . Bronchitis     2 weeks ago rx regime  . Depression   . GERD (gastroesophageal reflux disease)   . Arthritis   . Rotator cuff disorder    Past Surgical History  Procedure Laterality Date  . Spine surgery  7/ 12 last    5 previous  . Stimulator  10    spine,removed 7/12  . Cesarean section  84  . Lumbar fusion  04/27/2011   Family History  Problem Relation Age of Onset  . Stroke Father   . Hypertension Father    History  Substance Use Topics  . Smoking status: Current Every Day Smoker -- 1.00 packs/day for 30 years    Types: Cigarettes  . Smokeless tobacco: Never Used     Comment: prior trial of zyban made her feel weird   . Alcohol Use: No   OB History   Grav Para Term Preterm Abortions TAB SAB Ect Mult Living                 Review of Systems  Constitutional: Negative for fever and chills.  HENT: Negative for trouble swallowing.   Respiratory: Positive for cough, shortness of breath and wheezing.   Cardiovascular: Negative for chest pain.  Gastrointestinal: Negative for nausea, vomiting, abdominal pain, diarrhea and constipation.  Genitourinary: Negative for dysuria and hematuria.  Musculoskeletal: Positive for arthralgias and joint swelling. Negative for myalgias.  Skin: Negative for rash.  Neurological: Negative for numbness.  All other systems reviewed and are negative.  Allergies  Review of patient's allergies indicates no known allergies.  Home Medications   Prior to Admission medications   Medication Sig Start Date End Date Taking? Authorizing Provider  FLUoxetine (PROZAC) 20 MG tablet Take 60 mg by mouth daily.     Historical Provider, MD  gabapentin (NEURONTIN) 400 MG capsule Take 400 mg by mouth 4 (four) times daily.    Historical Provider, MD  lansoprazole (PREVACID) 30 MG capsule Take 30 mg by mouth daily.     Historical Provider, MD  naproxen (NAPROSYN) 500 MG tablet Take  500 mg by mouth 2 (two) times daily as needed (pain).  07/09/13   Historical Provider, MD  promethazine (PHENERGAN) 25 MG tablet Take 1 tablet (25 mg total) by mouth every 6 (six) hours as needed for nausea or vomiting. 08/13/13   Dorie Rank, MD  traMADol (ULTRAM) 50 MG tablet Take 1 tablet (50 mg total) by mouth 3 (three) times daily as needed (pain). 08/13/13   Dorie Rank, MD  valsartan-hydrochlorothiazide (DIOVAN HCT) 160-12.5 MG per tablet Take 1 tablet by mouth daily. 08/13/13   Dorie Rank, MD   Triage vitals: BP 129/70  Pulse 74  Temp(Src) 97.6 F (36.4 C) (Oral)  Resp 18  Ht 5\' 7"  (1.702 m)  Wt 193 lb 2 oz (87.601 kg)  BMI 30.24 kg/m2  SpO2 97%  Physical  Exam  Nursing note and vitals reviewed. Constitutional: She is oriented to person, place, and time. She appears well-developed and well-nourished. No distress.  HENT:  Head: Normocephalic and atraumatic.  Eyes: Conjunctivae and EOM are normal.  Neck: Neck supple. No tracheal deviation present.  Cardiovascular: Normal rate, regular rhythm and normal heart sounds.   Pulmonary/Chest: Effort normal. No respiratory distress. She has wheezes (Diffuse expiratory, bilaterally).  Musculoskeletal: Normal range of motion.       Left hand: She exhibits tenderness.  Bruising over distal ulna and proximal 5th metacarpal. Some mild diffuse swelling exquisitely tender to palpation. Sensation and pulses intact. Wrist ROM intact.  Neurological: She is alert and oriented to person, place, and time.  Skin: Skin is warm and dry.  Psychiatric: She has a normal mood and affect. Her behavior is normal.    ED Course  Procedures (including critical care time) DIAGNOSTIC STUDIES: Oxygen Saturation is 97% on room air, normal by my interpretation.    COORDINATION OF CARE: At 2:05 PM: Discussed treatment plan with patient which includes pain medication for her hand. Will receive a steroid and nebulizer treatment for cough and breathing. Patient agrees.    Labs Review Labs Reviewed - No data to display  Imaging Review Dg Chest 2 View (if Patient Has Fever And/or Copd)  09/08/2013   CLINICAL DATA:  Cough.  EXAM: CHEST  2 VIEW  COMPARISON:  04/07/2013  FINDINGS: Cardiac silhouette is normal in size. Normal mediastinal and hilar contours. Mildly hyperexpanded but clear lungs. No pleural effusion or pneumothorax. The bony thorax is intact.  IMPRESSION: No acute cardiopulmonary disease.   Electronically Signed   By: Lajean Manes M.D.   On: 09/08/2013 13:01   Dg Wrist Complete Left  09/08/2013   CLINICAL DATA:  Injury 3 weeks ago.  Left hand and wrist pain.  EXAM: LEFT WRIST - COMPLETE 3+ VIEW  COMPARISON:  None.   FINDINGS: No fracture. No dislocation. There are osteoarthritic changes at the trapezium first metacarpal articulation. There is mild diffuse soft tissue swelling most evident along the dorsal ulnar aspect.  IMPRESSION: No fracture or dislocation   Electronically Signed   By: Lajean Manes M.D.   On: 09/08/2013 13:02   Dg Hand Complete Left  09/08/2013   CLINICAL DATA:  Wrist and hand injury with pain and left wrist swelling.  EXAM: LEFT HAND - COMPLETE 3+ VIEW  COMPARISON:  None.  FINDINGS: No acute osseous or joint abnormality. Mild degenerative changes at the first carpometacarpal joint.  IMPRESSION: No acute findings.   Electronically Signed   By: Lorin Picket M.D.   On: 09/08/2013 13:04     EKG Interpretation None  MDM   Final diagnoses:  Wrist joint pain, right  Asthma exacerbation    Patient with c/o pain in the wrist.  Xray is negative. Patient has diffuse wheezing.  treating with albuterol neb.    4:24 PM BP 114/75  Pulse 61  Temp(Src) 97.6 F (36.4 C) (Oral)  Resp 18  Ht 5\' 7"  (1.702 m)  Wt 193 lb 2 oz (87.601 kg)  BMI 30.24 kg/m2  SpO2 100% Patient continues to have significant wheezing.  Will give hour long neb.  PA Gwenlyn Perking will assume care of the patient.  Plan ambulate with 02 if hypoxia or little improvement with neb, she may go home.   I personally performed the services described in this documentation, which was scribed in my presence. The recorded information has been reviewed and is accurate.     Madeline Mail, PA-C 09/10/13 2121

## 2013-09-08 NOTE — ED Provider Notes (Signed)
Care assumed from [place], PA-C at shift change. Patient receiving hour-long nebulizer treatment. Plan to check pulse ox while ambulating. If normal, discharge home. She will be discharged home with Norco, prednisone and albuterol inhaler. 6:19 PM Patient ambulated with pulse ox remaining between 90 and 100% on room air. On exam, lungs are clear. Stable for discharge. Potassium replaced by mouth. Return precautions given.  Illene Labrador, PA-C 09/08/13 4585

## 2013-09-08 NOTE — ED Notes (Addendum)
Pt maintained O2 saturation levels between 98-100% while ambulating in the hallway for approximately 100 ft.

## 2013-09-08 NOTE — ED Notes (Addendum)
She states "ive been coughing all night and my left hands been hurting since i hit it on something last week."

## 2013-09-08 NOTE — Discharge Instructions (Signed)
Asthma Asthma is a recurring condition in which the airways tighten and narrow. Asthma can make it difficult to breathe. It can cause coughing, wheezing, and shortness of breath. Asthma episodes, also called asthma attacks, range from minor to life-threatening. Asthma cannot be cured, but medicines and lifestyle changes can help control it. CAUSES Asthma is believed to be caused by inherited (genetic) and environmental factors, but its exact cause is unknown. Asthma may be triggered by allergens, lung infections, or irritants in the air. Asthma triggers are different for each person. Common triggers include:   Animal dander.  Dust mites.  Cockroaches.  Pollen from trees or grass.  Mold.  Smoke.  Air pollutants such as dust, household cleaners, hair sprays, aerosol sprays, paint fumes, strong chemicals, or strong odors.  Cold air, weather changes, and winds (which increase molds and pollens in the air).  Strong emotional expressions such as crying or laughing hard.  Stress.  Certain medicines (such as aspirin) or types of drugs (such as beta-blockers).  Sulfites in foods and drinks. Foods and drinks that may contain sulfites include dried fruit, potato chips, and sparkling grape juice.  Infections or inflammatory conditions such as the flu, a cold, or an inflammation of the nasal membranes (rhinitis).  Gastroesophageal reflux disease (GERD).  Exercise or strenuous activity. SYMPTOMS Symptoms may occur immediately after asthma is triggered or many hours later. Symptoms include:  Wheezing.  Excessive nighttime or early morning coughing.  Frequent or severe coughing with a common cold.  Chest tightness.  Shortness of breath. DIAGNOSIS  The diagnosis of asthma is made by a review of your medical history and a physical exam. Tests may also be performed. These may include:  Lung function studies. These tests show how much air you breathe in and out.  Allergy  tests.  Imaging tests such as X-rays. TREATMENT  Asthma cannot be cured, but it can usually be controlled. Treatment involves identifying and avoiding your asthma triggers. It also involves medicines. There are 2 classes of medicine used for asthma treatment:   Controller medicines. These prevent asthma symptoms from occurring. They are usually taken every day.  Reliever or rescue medicines. These quickly relieve asthma symptoms. They are used as needed and provide short-term relief. Your health care provider will help you create an asthma action plan. An asthma action plan is a written plan for managing and treating your asthma attacks. It includes a list of your asthma triggers and how they may be avoided. It also includes information on when medicines should be taken and when their dosage should be changed. An action plan may also involve the use of a device called a peak flow meter. A peak flow meter measures how well the lungs are working. It helps you monitor your condition. HOME CARE INSTRUCTIONS   Take medicine as directed by your health care provider. Speak with your health care provider if you have questions about how or when to take the medicines.  Use a peak flow meter as directed by your health care provider. Record and keep track of readings.  Understand and use the action plan to help minimize or stop an asthma attack without needing to seek medical care.  Control your home environment in the following ways to help prevent asthma attacks:  Do not smoke. Avoid being exposed to secondhand smoke.  Change your heating and air conditioning filter regularly.  Limit your use of fireplaces and wood stoves.  Get rid of pests (such as roaches and mice)  and their droppings.  Throw away plants if you see mold on them.  Clean your floors and dust regularly. Use unscented cleaning products.  Try to have someone else vacuum for you regularly. Stay out of rooms while they are being  vacuumed and for a short while afterward. If you vacuum, use a dust mask from a hardware store, a double-layered or microfilter vacuum cleaner bag, or a vacuum cleaner with a HEPA filter.  Replace carpet with wood, tile, or vinyl flooring. Carpet can trap dander and dust.  Use allergy-proof pillows, mattress covers, and box spring covers.  Wash bed sheets and blankets every week in hot water and dry them in a dryer.  Use blankets that are made of polyester or cotton.  Clean bathrooms and kitchens with bleach. If possible, have someone repaint the walls in these rooms with mold-resistant paint. Keep out of the rooms that are being cleaned and painted.  Wash hands frequently. SEEK MEDICAL CARE IF:   You have wheezing, shortness of breath, or a cough even if taking medicine to prevent attacks.  The colored mucus you cough up (sputum) is thicker than usual.  Your sputum changes from clear or white to yellow, green, [place], or bloody.  You have any problems that may be related to the medicines you are taking (such as a rash, itching, swelling, or trouble breathing).  You are using a reliever medicine more than 2-3 times per week.  Your peak flow is still at 50-79% of your personal best after following your action plan for 1 hour. SEEK IMMEDIATE MEDICAL CARE IF:   You seem to be getting worse and are unresponsive to treatment during an asthma attack.  You are short of breath even at rest.  You get short of breath when doing very little physical activity.  You have difficulty eating, drinking, or talking due to asthma symptoms.  You develop chest pain.  You develop a fast heartbeat.  You have a bluish color to your lips or fingernails.  You are lightheaded, dizzy, or faint.  Your peak flow is less than 50% of your personal best.  You have a fever or persistent symptoms for more than 2-3 days.  You have a fever and symptoms suddenly get worse. MAKE SURE YOU:   Understand  these instructions.  Will watch your condition.  Will get help right away if you are not doing well or get worse. Document Released: 02/07/2005 Document Revised: 02/12/2013 Document Reviewed: 09/06/2012 [facility] Patient Information 2015 [place], [place]. This information is not intended to replace advice given to you by your health care provider. Make sure you discuss any questions you have with your health care provider.  Wrist Pain Wrist injuries are frequent in adults and children. A sprain is an injury to the ligaments that hold your bones together. A strain is an injury to muscle or muscle cord-like structures (tendons) from stretching or pulling. Generally, when wrists are moderately tender to touch following a fall or injury, a break in the bone (fracture) may be present. Most wrist sprains or strains are better in 3 to 5 days, but complete healing may take several weeks. HOME CARE INSTRUCTIONS   Put ice on the injured area.  Put ice in a plastic bag.  Place a towel between your skin and the bag.  Leave the ice on for 15-20 minutes, 3-4 times a day, for the first 2 days, or as directed by your health care provider.  Keep your arm raised above  the level of your heart whenever possible to reduce swelling and pain.  Rest the injured area for at least 48 hours or as directed by your health care provider.  If a splint or elastic bandage has been applied, use it for as long as directed by your health care provider or until seen by a health care provider for a follow-up exam.  Only take over-the-counter or prescription medicines for pain, discomfort, or fever as directed by your health care provider.  Keep all follow-up appointments. You may need to follow up with a specialist or have follow-up X-rays. Improvement in pain level is not a guarantee that you did not fracture a bone in your wrist. The only way to determine whether or not you have a broken bone is by X-ray. SEEK IMMEDIATE  MEDICAL CARE IF:   Your fingers are swollen, very red, white, or cold and blue.  Your fingers are numb or tingling.  You have increasing pain.  You have difficulty moving your fingers. MAKE SURE YOU:   Understand these instructions.  Will watch your condition.  Will get help right away if you are not doing well or get worse. Document Released: 11/17/2004 Document Revised: 02/12/2013 Document Reviewed: 03/31/2010 [facility] Patient Information 2015 [place], [place]. This information is not intended to replace advice given to you by your health care provider. Make sure you discuss any questions you have with your health care provider.

## 2013-09-08 NOTE — ED Provider Notes (Signed)
Medical screening examination/treatment/procedure(s) were performed by non-physician practitioner and as supervising physician I was immediately available for consultation/collaboration.   EKG Interpretation None        Rushil Kimbrell N Irem Stoneham, DO 09/08/13 2259 

## 2013-09-11 NOTE — ED Provider Notes (Signed)
Medical screening examination/treatment/procedure(s) were performed by non-physician practitioner and as supervising physician I was immediately available for consultation/collaboration.   EKG Interpretation None       Jasper Riling. Alvino Chapel, MD 09/11/13 1133

## 2013-09-16 DIAGNOSIS — F331 Major depressive disorder, recurrent, moderate: Secondary | ICD-10-CM | POA: Diagnosis not present

## 2013-09-17 DIAGNOSIS — R5383 Other fatigue: Secondary | ICD-10-CM | POA: Diagnosis not present

## 2013-09-17 DIAGNOSIS — R5381 Other malaise: Secondary | ICD-10-CM | POA: Diagnosis not present

## 2013-11-22 DIAGNOSIS — K2970 Gastritis, unspecified, without bleeding: Secondary | ICD-10-CM | POA: Diagnosis not present

## 2013-11-22 DIAGNOSIS — R112 Nausea with vomiting, unspecified: Secondary | ICD-10-CM | POA: Diagnosis not present

## 2013-12-24 DIAGNOSIS — Z23 Encounter for immunization: Secondary | ICD-10-CM | POA: Diagnosis not present

## 2014-01-09 DIAGNOSIS — E1169 Type 2 diabetes mellitus with other specified complication: Secondary | ICD-10-CM | POA: Diagnosis not present

## 2014-01-09 DIAGNOSIS — I10 Essential (primary) hypertension: Secondary | ICD-10-CM | POA: Diagnosis not present

## 2014-01-20 ENCOUNTER — Emergency Department (HOSPITAL_COMMUNITY)
Admission: EM | Admit: 2014-01-20 | Discharge: 2014-01-20 | Disposition: A | Discharge status: Home / Self Care | Payer: Medicare Other | Attending: Emergency Medicine | Admitting: Emergency Medicine

## 2014-01-20 ENCOUNTER — Encounter (HOSPITAL_COMMUNITY): Payer: Self-pay | Admitting: Emergency Medicine

## 2014-01-20 DIAGNOSIS — K219 Gastro-esophageal reflux disease without esophagitis: Secondary | ICD-10-CM | POA: Diagnosis not present

## 2014-01-20 DIAGNOSIS — R110 Nausea: Secondary | ICD-10-CM | POA: Diagnosis not present

## 2014-01-20 DIAGNOSIS — F1193 Opioid use, unspecified with withdrawal: Secondary | ICD-10-CM

## 2014-01-20 DIAGNOSIS — F419 Anxiety disorder, unspecified: Secondary | ICD-10-CM | POA: Insufficient documentation

## 2014-01-20 DIAGNOSIS — E860 Dehydration: Secondary | ICD-10-CM | POA: Insufficient documentation

## 2014-01-20 DIAGNOSIS — F329 Major depressive disorder, single episode, unspecified: Secondary | ICD-10-CM | POA: Diagnosis not present

## 2014-01-20 DIAGNOSIS — Z8709 Personal history of other diseases of the respiratory system: Secondary | ICD-10-CM | POA: Diagnosis not present

## 2014-01-20 DIAGNOSIS — G8929 Other chronic pain: Secondary | ICD-10-CM | POA: Diagnosis not present

## 2014-01-20 DIAGNOSIS — R112 Nausea with vomiting, unspecified: Secondary | ICD-10-CM | POA: Diagnosis present

## 2014-01-20 DIAGNOSIS — M1990 Unspecified osteoarthritis, unspecified site: Secondary | ICD-10-CM | POA: Diagnosis not present

## 2014-01-20 DIAGNOSIS — F1123 Opioid dependence with withdrawal: Secondary | ICD-10-CM | POA: Insufficient documentation

## 2014-01-20 DIAGNOSIS — Z720 Tobacco use: Secondary | ICD-10-CM | POA: Insufficient documentation

## 2014-01-20 DIAGNOSIS — Z79899 Other long term (current) drug therapy: Secondary | ICD-10-CM | POA: Diagnosis not present

## 2014-01-20 DIAGNOSIS — I10 Essential (primary) hypertension: Secondary | ICD-10-CM | POA: Insufficient documentation

## 2014-01-20 DIAGNOSIS — M545 Low back pain: Secondary | ICD-10-CM | POA: Insufficient documentation

## 2014-01-20 DIAGNOSIS — R109 Unspecified abdominal pain: Secondary | ICD-10-CM | POA: Diagnosis not present

## 2014-01-20 LAB — URINALYSIS, ROUTINE W REFLEX MICROSCOPIC
Bilirubin Urine: NEGATIVE
Glucose, UA: NEGATIVE mg/dL
Ketones, ur: NEGATIVE mg/dL
Leukocytes, UA: NEGATIVE
Nitrite: NEGATIVE
Protein, ur: NEGATIVE mg/dL
Specific Gravity, Urine: 1.018 (ref 1.005–1.030)
Urobilinogen, UA: 0.2 mg/dL (ref 0.0–1.0)
pH: 5.5 (ref 5.0–8.0)

## 2014-01-20 LAB — COMPREHENSIVE METABOLIC PANEL
ALT: 14 U/L (ref 0–35)
AST: 15 U/L (ref 0–37)
Albumin: 4.4 g/dL (ref 3.5–5.2)
Alkaline Phosphatase: 91 U/L (ref 39–117)
Anion gap: 20 — ABNORMAL HIGH (ref 5–15)
BUN: 17 mg/dL (ref 6–23)
CO2: 16 mEq/L — ABNORMAL LOW (ref 19–32)
Calcium: 10.3 mg/dL (ref 8.4–10.5)
Chloride: 95 mEq/L — ABNORMAL LOW (ref 96–112)
Creatinine, Ser: 0.73 mg/dL (ref 0.50–1.10)
GFR calc Af Amer: >90 mL/min (ref >90)
GFR calc non Af Amer: >90 mL/min (ref >90)
Glucose, Bld: 127 mg/dL — ABNORMAL HIGH (ref 70–99)
Potassium: 3.8 mEq/L (ref 3.7–5.3)
Sodium: 131 mEq/L — ABNORMAL LOW (ref 137–147)
Total Bilirubin: 0.4 mg/dL (ref 0.3–1.2)
Total Protein: 8.6 g/dL — ABNORMAL HIGH (ref 6.0–8.3)

## 2014-01-20 LAB — CBC WITH DIFFERENTIAL/PLATELET
Basophils Absolute: 0 10*3/uL (ref 0.0–0.1)
Basophils Relative: 0 % (ref 0–1)
Eosinophils Absolute: 0.2 10*3/uL (ref 0.0–0.7)
Eosinophils Relative: 2 % (ref 0–5)
HCT: 43.4 % (ref 36.0–46.0)
Hemoglobin: 15.1 g/dL — ABNORMAL HIGH (ref 12.0–15.0)
Lymphocytes Relative: 22 % (ref 12–46)
Lymphs Abs: 2.1 10*3/uL (ref 0.7–4.0)
MCH: 30.6 pg (ref 26.0–34.0)
MCHC: 34.8 g/dL (ref 30.0–36.0)
MCV: 87.9 fL (ref 78.0–100.0)
Monocytes Absolute: 0.5 10*3/uL (ref 0.1–1.0)
Monocytes Relative: 5 % (ref 3–12)
Neutro Abs: 7.1 10*3/uL (ref 1.7–7.7)
Neutrophils Relative %: 72 % (ref 43–77)
Platelets: 328 10*3/uL (ref 150–400)
RBC: 4.94 MIL/uL (ref 3.87–5.11)
RDW: 14.4 % (ref 11.5–15.5)
WBC: 10 10*3/uL (ref 4.0–10.5)

## 2014-01-20 LAB — URINE MICROSCOPIC-ADD ON

## 2014-01-20 LAB — LIPASE, BLOOD: Lipase: 23 U/L (ref 11–59)

## 2014-01-20 MED ORDER — ONDANSETRON HCL 4 MG PO TABS: 4.0000 mg | ORAL_TABLET | Freq: Four times a day (QID) | ORAL | Status: DC

## 2014-01-20 MED ORDER — LORAZEPAM 2 MG/ML IJ SOLN
1.0000 mg | Freq: Once | INTRAMUSCULAR | Status: AC
  Administered 2014-01-20: 1 mg via INTRAVENOUS
  Filled 2014-01-20: qty 1

## 2014-01-20 MED ORDER — ONDANSETRON HCL 4 MG/2ML IJ SOLN
4.0000 mg | Freq: Once | INTRAMUSCULAR | Status: AC
  Administered 2014-01-20: 4 mg via INTRAVENOUS
  Filled 2014-01-20: qty 2

## 2014-01-20 MED ORDER — SODIUM CHLORIDE 0.9 % IV BOLUS (SEPSIS)
1000.0000 mL | Freq: Once | INTRAVENOUS | Status: AC
  Administered 2014-01-20: 1000 mL via INTRAVENOUS

## 2014-01-20 MED ORDER — ONDANSETRON HCL 4 MG/2ML IJ SOLN
4.0000 mg | Freq: Once | INTRAMUSCULAR | Status: DC
  Filled 2014-01-20: qty 2

## 2014-01-20 MED ORDER — ONDANSETRON 8 MG PO TBDP
8.0000 mg | ORAL_TABLET | Freq: Once | ORAL | Status: AC
  Administered 2014-01-20: 8 mg via ORAL
  Filled 2014-01-20: qty 1

## 2014-01-20 MED ORDER — GI COCKTAIL ~~LOC~~
30.0000 mL | Freq: Once | ORAL | Status: AC
  Administered 2014-01-20: 30 mL via ORAL
  Filled 2014-01-20: qty 30

## 2014-01-20 MED ORDER — PROMETHAZINE HCL 25 MG/ML IJ SOLN
25.0000 mg | Freq: Once | INTRAMUSCULAR | Status: AC
  Administered 2014-01-20: 25 mg via INTRAVENOUS
  Filled 2014-01-20: qty 1

## 2014-01-20 MED ORDER — BUPRENORPHINE HCL-NALOXONE HCL 8-2 MG SL FILM: 1.0000 | ORAL_FILM | Freq: Two times a day (BID) | SUBLINGUAL | Status: DC

## 2014-01-20 NOTE — ED Notes (Signed)
Bed: WA21 Expected date:  Expected time:  Means of arrival:  Comments: EMS-forgot 

## 2014-01-20 NOTE — ED Provider Notes (Signed)
CSN: [id number]     Arrival date & time 01/20/14  1111 History   First MD Initiated Contact with Patient 01/20/14 1117     Chief Complaint  Patient presents with  . Nausea  . Emesis     (Consider location/radiation/quality/duration/timing/severity/associated sxs/prior Treatment) HPI  Madeline Dickerson is a 53 y.o. female with PMH of HTN, chronic low back pain, GERD, depression and anxiety presenting with epigastric pain that started last night. She has not taken her normal Suboxone or Prevacid for the past 2-3 days. She's also had associated nausea and vomiting. Last stool was yesterday and little bit loose. No blood. Patient states his pain is burning and is like other acid reflux she has had before. It is worse with eating. Patient denies any alcohol or any other illicit drug use. Patient has a history of using cocaine but denies recent use. Patient denies any chest pain or discomfort no shortness of breath or worsening of back pain. She denies urinary complaints, vaginal complaints. She denies fevers and endorses some chills.   Past Medical History  Diagnosis Date  . Anxiety   . Hypertension   . Chronic low back pain 1991    disk s/p 4 diskectomies, 5th fursion, then spinal cord stimulator ([facility])  . Bronchitis     2 weeks ago rx regime  . Depression   . GERD (gastroesophageal reflux disease)   . Arthritis   . Rotator cuff disorder    Past Surgical History  Procedure Laterality Date  . Spine surgery  7/ 12 last    5 previous  . Stimulator  12    spine,removed 7/12  . Cesarean section  84  . Lumbar fusion  04/27/2011   Family History  Problem Relation Age of Onset  . Stroke Father   . Hypertension Father    History  Substance Use Topics  . Smoking status: Current Every Day Smoker -- 1.00 packs/day for 30 years    Types: Cigarettes  . Smokeless tobacco: Never Used     Comment: prior trial of zyban made her feel weird   . Alcohol Use: No   OB History    No data  available     Review of Systems  Constitutional: Negative for fever and chills.  HENT: Negative for congestion and rhinorrhea.   Respiratory: Negative for shortness of breath.   Cardiovascular: Negative for chest pain.  Gastrointestinal: Positive for nausea and vomiting. Negative for diarrhea.  Genitourinary: Negative for dysuria and hematuria.  Musculoskeletal: Positive for back pain.  Skin: Negative for rash.  Neurological: Negative for weakness and headaches.      Allergies  Naproxen  Home Medications   Prior to Admission medications   Medication Sig Start Date End Date Taking? Authorizing Provider  albuterol (PROVENTIL HFA;VENTOLIN HFA) 108 (90 BASE) MCG/ACT inhaler Inhale 2 puffs into the lungs every 4 (four) hours as needed for wheezing or shortness of breath. 09/08/13  Yes [name], PA-C  FLUoxetine (PROZAC) 20 MG tablet Take 60 mg by mouth daily.    Yes Historical Provider, MD  gabapentin (NEURONTIN) 400 MG capsule Take 400 mg by mouth 4 (four) times daily.   Yes Historical Provider, MD  lansoprazole (PREVACID) 30 MG capsule Take 30 mg by mouth daily.    Yes Historical Provider, MD  valsartan-hydrochlorothiazide (DIOVAN HCT) 160-12.5 MG per tablet Take 1 tablet by mouth daily. 08/13/13  Yes Dorie Rank, MD  Buprenorphine HCl-Naloxone HCl 8-2 MG FILM Place 1 Film (1 each  total) under the tongue 2 (two) times daily. 01/20/14   Evelina Bucy, MD  HYDROcodone-acetaminophen (NORCO) 5-325 MG per tablet Take 1-2 tablets by mouth every 6 (six) hours as needed for moderate pain. Patient not taking: Reported on 01/20/2014 09/08/13   [name], PA-C  ondansetron (ZOFRAN) 4 MG tablet Take 1 tablet (4 mg total) by mouth every 6 (six) hours. 01/20/14   Pura Spice, PA-C  predniSONE (DELTASONE) 20 MG tablet Take 2 tablets (40 mg total) by mouth daily. Patient not taking: Reported on 01/20/2014 09/08/13   [name], PA-C  promethazine (PHENERGAN) 25 MG tablet Take 1 tablet  (25 mg total) by mouth every 6 (six) hours as needed for nausea or vomiting. Patient not taking: Reported on 01/20/2014 08/13/13   Dorie Rank, MD  traMADol (ULTRAM) 50 MG tablet Take 1 tablet (50 mg total) by mouth 3 (three) times daily as needed (pain). Patient not taking: Reported on 01/20/2014 08/13/13   Dorie Rank, MD   BP 130/100 mmHg  Pulse 77  Temp(Src) 98.2 F (36.8 C) (Oral)  Resp 16  SpO2 100% Physical Exam  Constitutional: She appears well-developed and well-nourished. No distress.  HENT:  Head: Normocephalic and atraumatic.  Mouth/Throat: Oropharynx is clear and moist.  Eyes: Conjunctivae and EOM are normal. Right eye exhibits no discharge. Left eye exhibits no discharge.  Cardiovascular: Normal rate, regular rhythm and normal heart sounds.   Pulmonary/Chest: Effort normal and breath sounds normal. No respiratory distress. She has no wheezes.  Abdominal: Soft. Bowel sounds are normal. She exhibits no distension.  Epigastric tenderness to palpation without rebound, guarding, rigidity. No CVA tenderness.  Musculoskeletal:  No midline back tenderness, step off or crepitus. No Right or Left sided lower back tenderness. No CVA tenderness.  Neurological: She is alert. She exhibits normal muscle tone. Coordination normal.  Skin: Skin is warm and dry. She is not diaphoretic.  Nursing note and vitals reviewed.   ED Course  Procedures (including critical care time) Labs Review Labs Reviewed  URINALYSIS, ROUTINE W REFLEX MICROSCOPIC - Abnormal; Notable for the following:    APPearance CLOUDY (*)    Hgb urine dipstick SMALL (*)    All other components within normal limits  CBC WITH DIFFERENTIAL - Abnormal; Notable for the following:    Hemoglobin 15.1 (*)    All other components within normal limits  COMPREHENSIVE METABOLIC PANEL - Abnormal; Notable for the following:    Sodium 131 (*)    Chloride 95 (*)    CO2 16 (*)    Glucose, Bld 127 (*)    Total Protein 8.6 (*)    Anion  gap 20 (*)    All other components within normal limits  URINE MICROSCOPIC-ADD ON - Abnormal; Notable for the following:    Casts HYALINE CASTS (*)    All other components within normal limits  LIPASE, BLOOD    Imaging Review No results found.   EKG Interpretation None      MDM   Final diagnoses:  Chronic pain  Opioid withdrawal   Patient with history of chronic opioid abuse on Suboxone who was unable to attend an appointment at the [facility] with Dr. Cher Nakai due to transportation difficulties. Patient in acute withdrawal from Suboxone. Patient with nausea, vomiting, mild epigastric abdominal tenderness. VSS. Lab suggestive of dehydration from emesis. IV fluids provided. Patient given Zofran and Ativan with improvement of her symptoms. Pt tolerating PO fluids in ED. I contacted Dr. Marguerita Beards office and  he is unavaliable. Spoke with Dr. Jenean Lindau one of his colleagues who recommended starting her back on Suboxone (8mg  SL strip twice daily) with follow-up in their office in 3 days. Appointment has been scheduled. Pt given zofran script as well.  Discussed return precautions with patient. Discussed all results and patient verbalizes understanding and agrees with plan.  This is a shared patient. This patient was discussed with the physician, Dr. Mingo Amber who saw and evaluated the patient and agrees with the plan.        Pura Spice, PA-C 01/20/14 [place], MD 01/21/14 [phone]

## 2014-01-20 NOTE — ED Notes (Signed)
Patient reports that she ran out of her suboxine on Friday and has started to have N/V/D. Patient reports that she has chronic lower back pain and surgery

## 2014-01-20 NOTE — Discharge Instructions (Signed)
Return to the emergency room with worsening of symptoms, new symptoms or with symptoms that are concerning, especially  Follow up as directed above on Thursday with Suboxone clinic. Suboxone as directed Do not operate machinery, drive or drink alcohol while taking narcotics or muscle relaxers.  You may get drowsy or dizzy. Do not drive, use machinery, or do anything that needs mental alertness until you know how this medicine affects you. Do not stand or sit up quickly, especially if you are an older patient. This reduces the risk of dizzy or fainting spells. Alcohol may interfere with the effect of this medicine. Avoid alcoholic drinks.  This medicine will cause constipation. Try to have a bowel movement at least every 2 to 3 days. If you do not have a bowel movement for 3 days, call your doctor or health care professional.   Finding Treatment for Alcohol and Drug Addiction It can be hard to find the right place to get professional treatment. Here are some important things to consider:  There are different types of treatment to choose from.  Some programs are live-in (residential) while others are not (outpatient). Sometimes a combination is offered.  No single type of program is right for everyone.  Most treatment programs involve a combination of education, counseling, and a 12-step, spiritually-based approach.  There are non-spiritually based programs (not 12-step).  Some treatment programs are government sponsored. They are geared for patients without private insurance.  Treatment programs can vary in many respects such as:  Cost and types of insurance accepted.  Types of on-site medical services offered.  Length of stay, setting, and size.  Overall philosophy of treatment. A person may need specialized treatment or have needs not addressed by all programs. For example, adolescents need treatment appropriate for their age. Other people have secondary disorders that must be  managed as well. Secondary conditions can include mental illness, such as depression or diabetes. Often, a period of detoxification from alcohol or drugs is needed. This requires medical supervision and not all programs offer this. THINGS TO CONSIDER WHEN SELECTING A TREATMENT PROGRAM   Is the program certified by the appropriate government agency? Even private programs must be certified and employ certified professionals.  Does the program accept your insurance? If not, can a payment plan be set up?  Is the facility clean, organized, and well run? Do they allow you to speak with graduates who can share their treatment experience with you? Can you tour the facility? Can you meet with staff?  Does the program meet the full range of individual needs?  Does the treatment program address sexual orientation and physical disabilities? Do they provide age, gender, and culturally appropriate treatment services?  Is treatment available in languages other than English?  Is long-term aftercare support or guidance encouraged and provided?  Is assessment of an individual's treatment plan ongoing to ensure it meets changing needs?  Does the program use strategies to encourage reluctant patients to remain in treatment long enough to increase the likelihood of success?  Does the program offer counseling (individual or group) and other behavioral therapies?  Does the program offer medicine as part of the treatment regimen, if needed?  Is there ongoing monitoring of possible relapse? Is there a defined relapse prevention program? Are services or referrals offered to family members to ensure they understand addiction and the recovery process? This would help them support the recovering individual.  Are 12-step meetings held at the center or is transport available for patients  to attend outside meetings? In countries outside of the [place] and [place], [employer] for contact information for services  in your area. Document Released: 01/06/2005 Document Revised: 05/02/2011 Document Reviewed: 07/19/2007 [facility] Patient Information 2015 [place], [place]. This information is not intended to replace advice given to you by your health care provider. Make sure you discuss any questions you have with your health care provider.

## 2014-01-20 NOTE — ED Notes (Signed)
Pt. tolerated her diet coke well she did not vomit after drinking it.

## 2014-01-23 DIAGNOSIS — Z79899 Other long term (current) drug therapy: Secondary | ICD-10-CM | POA: Diagnosis not present

## 2014-03-02 ENCOUNTER — Emergency Department (HOSPITAL_COMMUNITY)
Admission: EM | Admit: 2014-03-02 | Discharge: 2014-03-02 | Disposition: A | Discharge status: Home / Self Care | Payer: Medicare Other | Attending: Emergency Medicine | Admitting: Emergency Medicine

## 2014-03-02 DIAGNOSIS — R112 Nausea with vomiting, unspecified: Secondary | ICD-10-CM | POA: Insufficient documentation

## 2014-03-02 DIAGNOSIS — Z720 Tobacco use: Secondary | ICD-10-CM | POA: Insufficient documentation

## 2014-03-02 DIAGNOSIS — M1990 Unspecified osteoarthritis, unspecified site: Secondary | ICD-10-CM | POA: Insufficient documentation

## 2014-03-02 DIAGNOSIS — I10 Essential (primary) hypertension: Secondary | ICD-10-CM | POA: Diagnosis not present

## 2014-03-02 DIAGNOSIS — Z8709 Personal history of other diseases of the respiratory system: Secondary | ICD-10-CM | POA: Insufficient documentation

## 2014-03-02 DIAGNOSIS — R1011 Right upper quadrant pain: Secondary | ICD-10-CM | POA: Insufficient documentation

## 2014-03-02 DIAGNOSIS — Z7952 Long term (current) use of systemic steroids: Secondary | ICD-10-CM | POA: Diagnosis not present

## 2014-03-02 DIAGNOSIS — Z79899 Other long term (current) drug therapy: Secondary | ICD-10-CM | POA: Diagnosis not present

## 2014-03-02 DIAGNOSIS — R1013 Epigastric pain: Secondary | ICD-10-CM | POA: Diagnosis not present

## 2014-03-02 DIAGNOSIS — R1012 Left upper quadrant pain: Secondary | ICD-10-CM | POA: Diagnosis not present

## 2014-03-02 DIAGNOSIS — K219 Gastro-esophageal reflux disease without esophagitis: Secondary | ICD-10-CM | POA: Diagnosis not present

## 2014-03-02 DIAGNOSIS — G8929 Other chronic pain: Secondary | ICD-10-CM | POA: Insufficient documentation

## 2014-03-02 DIAGNOSIS — Z9889 Other specified postprocedural states: Secondary | ICD-10-CM | POA: Insufficient documentation

## 2014-03-02 DIAGNOSIS — F329 Major depressive disorder, single episode, unspecified: Secondary | ICD-10-CM | POA: Insufficient documentation

## 2014-03-02 DIAGNOSIS — F419 Anxiety disorder, unspecified: Secondary | ICD-10-CM | POA: Insufficient documentation

## 2014-03-02 LAB — LIPASE, BLOOD: Lipase: 23 U/L (ref 11–59)

## 2014-03-02 LAB — COMPREHENSIVE METABOLIC PANEL
ALT: 15 U/L (ref 0–35)
AST: 24 U/L (ref 0–37)
Albumin: 3.9 g/dL (ref 3.5–5.2)
Alkaline Phosphatase: 73 U/L (ref 39–117)
Anion gap: 11 (ref 5–15)
BUN: 17 mg/dL (ref 6–23)
CO2: 23 mmol/L (ref 19–32)
Calcium: 9.3 mg/dL (ref 8.4–10.5)
Chloride: 99 mEq/L (ref 96–112)
Creatinine, Ser: 0.93 mg/dL (ref 0.50–1.10)
GFR calc Af Amer: 79 mL/min — ABNORMAL LOW (ref >90)
GFR calc non Af Amer: 68 mL/min — ABNORMAL LOW (ref >90)
Glucose, Bld: 111 mg/dL — ABNORMAL HIGH (ref 70–99)
Potassium: 3.8 mmol/L (ref 3.5–5.1)
Sodium: 133 mmol/L — ABNORMAL LOW (ref 135–145)
Total Bilirubin: 0.2 mg/dL — ABNORMAL LOW (ref 0.3–1.2)
Total Protein: 7.3 g/dL (ref 6.0–8.3)

## 2014-03-02 LAB — URINE MICROSCOPIC-ADD ON

## 2014-03-02 LAB — CBC WITH DIFFERENTIAL/PLATELET
Basophils Absolute: 0 10*3/uL (ref 0.0–0.1)
Basophils Relative: 0 % (ref 0–1)
Eosinophils Absolute: 0 10*3/uL (ref 0.0–0.7)
Eosinophils Relative: 0 % (ref 0–5)
HCT: 40.1 % (ref 36.0–46.0)
Hemoglobin: 13.5 g/dL (ref 12.0–15.0)
Lymphocytes Relative: 15 % (ref 12–46)
Lymphs Abs: 2.2 10*3/uL (ref 0.7–4.0)
MCH: 29.9 pg (ref 26.0–34.0)
MCHC: 33.7 g/dL (ref 30.0–36.0)
MCV: 88.9 fL (ref 78.0–100.0)
Monocytes Absolute: 1.1 10*3/uL — ABNORMAL HIGH (ref 0.1–1.0)
Monocytes Relative: 7 % (ref 3–12)
Neutro Abs: 11.1 10*3/uL — ABNORMAL HIGH (ref 1.7–7.7)
Neutrophils Relative %: 78 % — ABNORMAL HIGH (ref 43–77)
Platelets: 341 10*3/uL (ref 150–400)
RBC: 4.51 MIL/uL (ref 3.87–5.11)
RDW: 13.9 % (ref 11.5–15.5)
WBC: 14.4 10*3/uL — ABNORMAL HIGH (ref 4.0–10.5)

## 2014-03-02 LAB — URINALYSIS, ROUTINE W REFLEX MICROSCOPIC
Bilirubin Urine: NEGATIVE
Glucose, UA: NEGATIVE mg/dL
Ketones, ur: NEGATIVE mg/dL
Leukocytes, UA: NEGATIVE
Nitrite: NEGATIVE
Protein, ur: NEGATIVE mg/dL
Specific Gravity, Urine: 1.019 (ref 1.005–1.030)
Urobilinogen, UA: 0.2 mg/dL (ref 0.0–1.0)
pH: 6 (ref 5.0–8.0)

## 2014-03-02 MED ORDER — ONDANSETRON 4 MG PO TBDP
4.0000 mg | ORAL_TABLET | Freq: Once | ORAL | Status: DC
  Filled 2014-03-02: qty 1

## 2014-03-02 MED ORDER — DIPHENHYDRAMINE HCL 50 MG/ML IJ SOLN
25.0000 mg | Freq: Once | INTRAMUSCULAR | Status: AC
  Administered 2014-03-02: 09:00:00 via INTRAVENOUS
  Filled 2014-03-02: qty 1

## 2014-03-02 MED ORDER — METOCLOPRAMIDE HCL 5 MG/ML IJ SOLN
10.0000 mg | Freq: Once | INTRAMUSCULAR | Status: AC
  Administered 2014-03-02: 10 mg via INTRAVENOUS
  Filled 2014-03-02: qty 2

## 2014-03-02 MED ORDER — GI COCKTAIL ~~LOC~~
30.0000 mL | Freq: Once | ORAL | Status: AC
  Administered 2014-03-02: 30 mL via ORAL
  Filled 2014-03-02: qty 30

## 2014-03-02 MED ORDER — SODIUM CHLORIDE 0.9 % IV BOLUS (SEPSIS)
1000.0000 mL | Freq: Once | INTRAVENOUS | Status: AC
  Administered 2014-03-02: 1000 mL via INTRAVENOUS

## 2014-03-02 NOTE — Discharge Instructions (Signed)

## 2014-03-02 NOTE — ED Provider Notes (Signed)
CSN: [id number]     Arrival date & time 03/02/14  7371 History   First MD Initiated Contact with Patient 03/02/14 [phone]     Chief Complaint  Patient presents with  . Emesis  . Abdominal Pain     (Consider location/radiation/quality/duration/timing/severity/associated sxs/prior Treatment) Patient is a 54 y.o. female presenting with abdominal pain.  Abdominal Pain Pain location:  Epigastric Pain quality: cramping   Pain radiates to:  Does not radiate Pain severity:  Moderate Onset quality:  Gradual Duration:  1 day Timing:  Constant Progression:  Worsening Chronicity:  New Context: not recent illness   Relieved by:  Nothing Worsened by:  Movement and palpation Associated symptoms: nausea and vomiting   Associated symptoms: no anorexia, no constipation, no dysuria and no fever     Past Medical History  Diagnosis Date  . Anxiety   . Hypertension   . Chronic low back pain 1991    disk s/p 4 diskectomies, 5th fursion, then spinal cord stimulator ([facility])  . Bronchitis     2 weeks ago rx regime  . Depression   . GERD (gastroesophageal reflux disease)   . Arthritis   . Rotator cuff disorder    Past Surgical History  Procedure Laterality Date  . Spine surgery  7/ 12 last    5 previous  . Stimulator  12    spine,removed 7/12  . Cesarean section  84  . Lumbar fusion  04/27/2011   Family History  Problem Relation Age of Onset  . Stroke Father   . Hypertension Father    History  Substance Use Topics  . Smoking status: Current Every Day Smoker -- 1.00 packs/day for 30 years    Types: Cigarettes  . Smokeless tobacco: Never Used     Comment: prior trial of zyban made her feel weird   . Alcohol Use: No   OB History    No data available     Review of Systems  Constitutional: Negative for fever.  Gastrointestinal: Positive for nausea, vomiting and abdominal pain. Negative for constipation and anorexia.  Genitourinary: Negative for dysuria.  All other systems  reviewed and are negative.     Allergies  Naproxen  Home Medications   Prior to Admission medications   Medication Sig Start Date End Date Taking? Authorizing Provider  albuterol (PROVENTIL HFA;VENTOLIN HFA) 108 (90 BASE) MCG/ACT inhaler Inhale 2 puffs into the lungs every 4 (four) hours as needed for wheezing or shortness of breath. 09/08/13  Yes [name], PA-C  FLUoxetine (PROZAC) 20 MG tablet Take 60 mg by mouth daily.    Yes Historical Provider, MD  lansoprazole (PREVACID) 30 MG capsule Take 30 mg by mouth daily.    Yes Historical Provider, MD  LYRICA 100 MG capsule Take 3 capsules by mouth daily. 02/06/14  Yes Historical Provider, MD  Naproxen Sodium (ALEVE) 220 MG CAPS Take 1 capsule by mouth daily as needed (Pain).   Yes Historical Provider, MD  valsartan-hydrochlorothiazide (DIOVAN HCT) 160-12.5 MG per tablet Take 1 tablet by mouth daily. 08/13/13  Yes Dorie Rank, MD  Buprenorphine HCl-Naloxone HCl 8-2 MG FILM Place 1 Film (1 each total) under the tongue 2 (two) times daily. Patient not taking: Reported on 03/02/2014 01/20/14   Evelina Bucy, MD  HYDROcodone-acetaminophen [facility]) 5-325 MG per tablet Take 1-2 tablets by mouth every 6 (six) hours as needed for moderate pain. Patient not taking: Reported on 01/20/2014 09/08/13   [name], PA-C  ondansetron (ZOFRAN) 4 MG tablet Take  1 tablet (4 mg total) by mouth every 6 (six) hours. Patient not taking: Reported on 03/02/2014 01/20/14   Pura Spice, PA-C  predniSONE (DELTASONE) 20 MG tablet Take 2 tablets (40 mg total) by mouth daily. Patient not taking: Reported on 01/20/2014 09/08/13   [name], PA-C  promethazine (PHENERGAN) 25 MG tablet Take 1 tablet (25 mg total) by mouth every 6 (six) hours as needed for nausea or vomiting. Patient not taking: Reported on 01/20/2014 08/13/13   Dorie Rank, MD  traMADol (ULTRAM) 50 MG tablet Take 1 tablet (50 mg total) by mouth 3 (three) times daily as needed (pain). Patient not  taking: Reported on 01/20/2014 08/13/13   Dorie Rank, MD   BP 140/74 mmHg  Pulse 65  Temp(Src) 98.1 F (36.7 C) (Oral)  SpO2 97% Physical Exam  Constitutional: She is oriented to person, place, and time. She appears well-developed and well-nourished.  HENT:  Head: Normocephalic and atraumatic.  Right Ear: External ear normal.  Left Ear: External ear normal.  Eyes: Conjunctivae and EOM are normal. Pupils are equal, round, and reactive to light.  Neck: Normal range of motion. Neck supple.  Cardiovascular: Normal rate, regular rhythm, normal heart sounds and intact distal pulses.   Pulmonary/Chest: Effort normal and breath sounds normal.  Abdominal: Soft. Bowel sounds are normal. There is tenderness in the right upper quadrant, epigastric area and left upper quadrant.  Musculoskeletal: Normal range of motion.  Neurological: She is alert and oriented to person, place, and time.  Skin: Skin is warm and dry.  Vitals reviewed.   ED Course  Procedures (including critical care time) Labs Review Labs Reviewed  CBC WITH DIFFERENTIAL - Abnormal; Notable for the following:    WBC 14.4 (*)    Neutrophils Relative % 78 (*)    Neutro Abs 11.1 (*)    Monocytes Absolute 1.1 (*)    All other components within normal limits  COMPREHENSIVE METABOLIC PANEL - Abnormal; Notable for the following:    Sodium 133 (*)    Glucose, Bld 111 (*)    Total Bilirubin 0.2 (*)    GFR calc non Af Amer 68 (*)    GFR calc Af Amer 79 (*)    All other components within normal limits  URINALYSIS, ROUTINE W REFLEX MICROSCOPIC - Abnormal; Notable for the following:    APPearance CLOUDY (*)    Hgb urine dipstick TRACE (*)    All other components within normal limits  URINE MICROSCOPIC-ADD ON - Abnormal; Notable for the following:    Squamous Epithelial / LPF MANY (*)    All other components within normal limits  LIPASE, BLOOD    Imaging Review No results found.   EKG Interpretation None      MDM   Final  diagnoses:  Epigastric pain    54 y.o. female with pertinent PMH of chronic abd pain, anxiety presents with recurrent abdominal pain. No fevers, lower GI symptoms. Patient has been nauseous and vomiting over the same time course. On arrival today vitals signs and physical exam as above. Abdominal exam without focal tenderness.  .   Labs unremarkable.  Pt given symptomatic relief with improvement.  DC home with standard return precautions.    I have reviewed all laboratory and imaging studies if ordered as above  1. Epigastric pain         Debby Freiberg, MD 03/02/14 [phone]
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
## 2014-03-02 NOTE — ED Notes (Signed)
This Rn went to give pt discharge paperwork, pt no longer in room Pt axo x4 upon departure.

## 2014-03-05 DIAGNOSIS — K2970 Gastritis, unspecified, without bleeding: Secondary | ICD-10-CM | POA: Diagnosis not present

## 2014-03-05 DIAGNOSIS — R1013 Epigastric pain: Secondary | ICD-10-CM | POA: Diagnosis not present

## 2014-03-05 DIAGNOSIS — I10 Essential (primary) hypertension: Secondary | ICD-10-CM | POA: Diagnosis not present

## 2014-03-05 DIAGNOSIS — R112 Nausea with vomiting, unspecified: Secondary | ICD-10-CM | POA: Diagnosis not present

## 2014-04-28 ENCOUNTER — Emergency Department (HOSPITAL_COMMUNITY): Payer: Medicare Other

## 2014-04-28 ENCOUNTER — Encounter (HOSPITAL_COMMUNITY): Payer: Self-pay | Admitting: Emergency Medicine

## 2014-04-28 ENCOUNTER — Emergency Department (HOSPITAL_COMMUNITY)
Admission: EM | Admit: 2014-04-28 | Discharge: 2014-04-28 | Disposition: A | Discharge status: Home / Self Care | Payer: Medicare Other | Attending: Emergency Medicine | Admitting: Emergency Medicine

## 2014-04-28 DIAGNOSIS — G8929 Other chronic pain: Secondary | ICD-10-CM | POA: Diagnosis not present

## 2014-04-28 DIAGNOSIS — R1011 Right upper quadrant pain: Secondary | ICD-10-CM | POA: Diagnosis not present

## 2014-04-28 DIAGNOSIS — E871 Hypo-osmolality and hyponatremia: Secondary | ICD-10-CM | POA: Diagnosis not present

## 2014-04-28 DIAGNOSIS — Z6833 Body mass index (BMI) 33.0-33.9, adult: Secondary | ICD-10-CM | POA: Diagnosis not present

## 2014-04-28 DIAGNOSIS — M1990 Unspecified osteoarthritis, unspecified site: Secondary | ICD-10-CM | POA: Diagnosis not present

## 2014-04-28 DIAGNOSIS — R1031 Right lower quadrant pain: Secondary | ICD-10-CM | POA: Diagnosis not present

## 2014-04-28 DIAGNOSIS — K219 Gastro-esophageal reflux disease without esophagitis: Secondary | ICD-10-CM | POA: Insufficient documentation

## 2014-04-28 DIAGNOSIS — Z79899 Other long term (current) drug therapy: Secondary | ICD-10-CM | POA: Insufficient documentation

## 2014-04-28 DIAGNOSIS — M5441 Lumbago with sciatica, right side: Secondary | ICD-10-CM | POA: Diagnosis not present

## 2014-04-28 DIAGNOSIS — Z3202 Encounter for pregnancy test, result negative: Secondary | ICD-10-CM | POA: Diagnosis not present

## 2014-04-28 DIAGNOSIS — I10 Essential (primary) hypertension: Secondary | ICD-10-CM | POA: Diagnosis not present

## 2014-04-28 DIAGNOSIS — R109 Unspecified abdominal pain: Secondary | ICD-10-CM

## 2014-04-28 DIAGNOSIS — M545 Low back pain: Secondary | ICD-10-CM | POA: Diagnosis present

## 2014-04-28 DIAGNOSIS — F419 Anxiety disorder, unspecified: Secondary | ICD-10-CM | POA: Insufficient documentation

## 2014-04-28 DIAGNOSIS — Z720 Tobacco use: Secondary | ICD-10-CM | POA: Insufficient documentation

## 2014-04-28 DIAGNOSIS — M549 Dorsalgia, unspecified: Secondary | ICD-10-CM

## 2014-04-28 DIAGNOSIS — Z8709 Personal history of other diseases of the respiratory system: Secondary | ICD-10-CM | POA: Diagnosis not present

## 2014-04-28 DIAGNOSIS — Z87442 Personal history of urinary calculi: Secondary | ICD-10-CM | POA: Diagnosis not present

## 2014-04-28 DIAGNOSIS — F329 Major depressive disorder, single episode, unspecified: Secondary | ICD-10-CM | POA: Insufficient documentation

## 2014-04-28 DIAGNOSIS — R310 Gross hematuria: Secondary | ICD-10-CM | POA: Diagnosis not present

## 2014-04-28 LAB — URINE MICROSCOPIC-ADD ON

## 2014-04-28 LAB — URINALYSIS, ROUTINE W REFLEX MICROSCOPIC
Bilirubin Urine: NEGATIVE
Glucose, UA: NEGATIVE mg/dL
Ketones, ur: NEGATIVE mg/dL
Leukocytes, UA: NEGATIVE
Nitrite: NEGATIVE
Protein, ur: NEGATIVE mg/dL
Specific Gravity, Urine: 1.004 — ABNORMAL LOW (ref 1.005–1.030)
Urobilinogen, UA: 0.2 mg/dL (ref 0.0–1.0)
pH: 6 (ref 5.0–8.0)

## 2014-04-28 LAB — COMPREHENSIVE METABOLIC PANEL
ALT: 15 U/L (ref 0–35)
AST: 18 U/L (ref 0–37)
Albumin: 3.4 g/dL — ABNORMAL LOW (ref 3.5–5.2)
Alkaline Phosphatase: 65 U/L (ref 39–117)
Anion gap: 11 (ref 5–15)
BUN: 7 mg/dL (ref 6–23)
CO2: 23 mmol/L (ref 19–32)
Calcium: 9.1 mg/dL (ref 8.4–10.5)
Chloride: 96 mmol/L (ref 96–112)
Creatinine, Ser: 0.65 mg/dL (ref 0.50–1.10)
GFR calc Af Amer: >90 mL/min (ref >90)
GFR calc non Af Amer: >90 mL/min (ref >90)
Glucose, Bld: 87 mg/dL (ref 70–99)
Potassium: 3.7 mmol/L (ref 3.5–5.1)
Sodium: 130 mmol/L — ABNORMAL LOW (ref 135–145)
Total Bilirubin: 0.5 mg/dL (ref 0.3–1.2)
Total Protein: 6.5 g/dL (ref 6.0–8.3)

## 2014-04-28 LAB — CBC
HCT: 32.8 % — ABNORMAL LOW (ref 36.0–46.0)
Hemoglobin: 11.4 g/dL — ABNORMAL LOW (ref 12.0–15.0)
MCH: 31.1 pg (ref 26.0–34.0)
MCHC: 34.8 g/dL (ref 30.0–36.0)
MCV: 89.4 fL (ref 78.0–100.0)
Platelets: 374 10*3/uL (ref 150–400)
RBC: 3.67 MIL/uL — ABNORMAL LOW (ref 3.87–5.11)
RDW: 14.2 % (ref 11.5–15.5)
WBC: 14.3 10*3/uL — ABNORMAL HIGH (ref 4.0–10.5)

## 2014-04-28 LAB — POC URINE PREG, ED: Preg Test, Ur: NEGATIVE

## 2014-04-28 LAB — BRAIN NATRIURETIC PEPTIDE: B Natriuretic Peptide: 134.9 pg/mL — ABNORMAL HIGH (ref 0.0–100.0)

## 2014-04-28 MED ORDER — HYDROMORPHONE HCL 1 MG/ML IJ SOLN: 1.0000 mg | Freq: Once | INTRAMUSCULAR | Status: DC

## 2014-04-28 MED ORDER — RANITIDINE HCL 150 MG PO TABS: 150.0000 mg | ORAL_TABLET | Freq: Two times a day (BID) | ORAL | Status: DC

## 2014-04-28 MED ORDER — HYDROMORPHONE HCL 1 MG/ML IJ SOLN
1.0000 mg | Freq: Once | INTRAMUSCULAR | Status: AC
  Administered 2014-04-28: 1 mg via INTRAVENOUS
  Filled 2014-04-28: qty 1

## 2014-04-28 MED ORDER — HYDROCODONE-ACETAMINOPHEN 5-325 MG PO TABS: 1.0000 | ORAL_TABLET | ORAL | Status: DC | PRN

## 2014-04-28 MED ORDER — KETOROLAC TROMETHAMINE 30 MG/ML IJ SOLN
30.0000 mg | Freq: Once | INTRAMUSCULAR | Status: AC
  Administered 2014-04-28: 30 mg via INTRAVENOUS
  Filled 2014-04-28: qty 1

## 2014-04-28 MED ORDER — SODIUM CHLORIDE 0.9 % IV BOLUS (SEPSIS)
1000.0000 mL | Freq: Once | INTRAVENOUS | Status: AC
Start: 2014-04-28 — End: 2014-04-28
  Administered 2014-04-28: 1000 mL via INTRAVENOUS

## 2014-04-28 MED ORDER — ONDANSETRON HCL 4 MG/2ML IJ SOLN
4.0000 mg | Freq: Once | INTRAMUSCULAR | Status: AC
Start: 2014-04-28 — End: 2014-04-28
  Administered 2014-04-28: 4 mg via INTRAVENOUS
  Filled 2014-04-28: qty 2

## 2014-04-28 MED ORDER — IBUPROFEN 600 MG PO TABS: 600.0000 mg | ORAL_TABLET | Freq: Three times a day (TID) | ORAL | Status: DC | PRN

## 2014-04-28 NOTE — Discharge Instructions (Signed)
-   Your sodium level was 130. You have chronically low sodium but you should follow-up with your PCP for repeat labs. - Your WBC was also mildly elevated, but its better than it has been in the past. Continue to follow-up with your doctor for this. - Follow-up with your PCP for management of your back pain - Watch for any rash, and if a rash develops call your PCP to set up an appointment

## 2014-04-28 NOTE — ED Notes (Signed)
Pt reports R flank/lower back pain radiating around to groin x 5-6 days. Pt also reports that she is swollen all over, blood in urine and nausea. Denies fever.

## 2014-04-28 NOTE — ED Provider Notes (Signed)
CSN: [id number]     Arrival date & time 04/28/14  1616 History   First MD Initiated Contact with Patient 04/28/14 1857     Chief Complaint  Patient presents with  . Back Pain     (Consider location/radiation/quality/duration/timing/severity/associated sxs/prior Treatment) HPI   54 yo F with PMHx of HTN, anxiety, hyponatremia, and chronic LBP s/p 4 discectomies who p/w a 5-6 day history of right flank and lower back pain. The patient states her symptoms started gradually approximately 6 days ago with mild right-sided paraspinal back pain that she thought was secondary to her chronic back pain. The pain then began to radiate around towards her right flank, which is atypical for her. She describes the pain as a constant gnawing sensation that is 10 out of 10 in severity at this time. The pain is made worse with palpation as well as movement and improves with rest. She also endorses generalized swelling of her hands and feet, although this is a chronic problem, but states it is worsened over the same time. She also notes transient hematuria and nausea several days ago that has resolved. Denies any fevers or chills. Denies any urinary frequency or urgency at this time. Denies any cough, sputum production, or shortness of breath. No chest pain.  Past Medical History  Diagnosis Date  . Anxiety   . Hypertension   . Chronic low back pain 1991    disk s/p 4 diskectomies, 5th fursion, then spinal cord stimulator ([facility])  . Bronchitis     2 weeks ago rx regime  . Depression   . GERD (gastroesophageal reflux disease)   . Arthritis   . Rotator cuff disorder    Past Surgical History  Procedure Laterality Date  . Spine surgery  7/ 12 last    5 previous  . Stimulator  12    spine,removed 7/12  . Cesarean section  84  . Lumbar fusion  04/27/2011   Family History  Problem Relation Age of Onset  . Stroke Father   . Hypertension Father    History  Substance Use Topics  . Smoking status: Current  Every Day Smoker -- 1.00 packs/day for 30 years    Types: Cigarettes  . Smokeless tobacco: Never Used     Comment: prior trial of zyban made her feel weird   . Alcohol Use: No   OB History    No data available     Review of Systems  Constitutional: Negative for fever and chills.  HENT: Negative for congestion, rhinorrhea and sore throat.   Eyes: Negative for visual disturbance.  Respiratory: Negative for cough, shortness of breath and wheezing.   Cardiovascular: Negative for chest pain and leg swelling.  Gastrointestinal: Positive for nausea (Transient, now resolved). Negative for vomiting, abdominal pain and diarrhea.  Genitourinary: Positive for hematuria (Transient, now resolved) and flank pain. Negative for dysuria.  Musculoskeletal: Positive for back pain. Negative for gait problem, neck pain and neck stiffness.  Skin: Negative for rash.  Allergic/Immunologic: Negative for immunocompromised state.  Neurological: Negative for dizziness, syncope, weakness and headaches.      Allergies  Naproxen  Home Medications   Prior to Admission medications   Medication Sig Start Date End Date Taking? Authorizing Provider  FLUoxetine (PROZAC) 20 MG tablet Take 40 mg by mouth daily.    Yes Historical Provider, MD  ibuprofen (ADVIL,MOTRIN) 800 MG tablet Take 800 mg by mouth every 8 (eight) hours as needed for moderate pain.   Yes Historical Provider,  MD  lansoprazole (PREVACID) 30 MG capsule Take 15 mg by mouth daily.    Yes Historical Provider, MD  LYRICA 100 MG capsule Take 100 mg by mouth 3 (three) times daily.  02/06/14  Yes Historical Provider, MD  Naproxen Sodium (ALEVE) 220 MG CAPS Take 1 capsule by mouth daily as needed (Pain).   Yes Historical Provider, MD  valsartan-hydrochlorothiazide (DIOVAN HCT) 160-12.5 MG per tablet Take 1 tablet by mouth daily. 08/13/13  Yes Dorie Rank, MD  albuterol (PROVENTIL HFA;VENTOLIN HFA) 108 (90 BASE) MCG/ACT inhaler Inhale 2 puffs into the lungs  every 4 (four) hours as needed for wheezing or shortness of breath. Patient not taking: Reported on 04/28/2014 09/08/13   [name], PA-C  Buprenorphine HCl-Naloxone HCl 8-2 MG FILM Place 1 Film (1 each total) under the tongue 2 (two) times daily. Patient not taking: Reported on 03/02/2014 01/20/14   Evelina Bucy, MD  HYDROcodone-acetaminophen [facility]) 5-325 MG per tablet Take 1-2 tablets by mouth every 6 (six) hours as needed for moderate pain. Patient not taking: Reported on 01/20/2014 09/08/13   [name], PA-C  ondansetron (ZOFRAN) 4 MG tablet Take 1 tablet (4 mg total) by mouth every 6 (six) hours. Patient not taking: Reported on 03/02/2014 01/20/14   Al Corpus, PA-C  predniSONE (DELTASONE) 20 MG tablet Take 2 tablets (40 mg total) by mouth daily. Patient not taking: Reported on 01/20/2014 09/08/13   [name], PA-C  promethazine (PHENERGAN) 25 MG tablet Take 1 tablet (25 mg total) by mouth every 6 (six) hours as needed for nausea or vomiting. Patient not taking: Reported on 01/20/2014 08/13/13   Dorie Rank, MD  traMADol (ULTRAM) 50 MG tablet Take 1 tablet (50 mg total) by mouth 3 (three) times daily as needed (pain). Patient not taking: Reported on 01/20/2014 08/13/13   Dorie Rank, MD   BP 130/74 mmHg  Pulse 72  Temp(Src) 97.8 F (36.6 C)  Resp 16  SpO2 96% Physical Exam  Constitutional: She is oriented to person, place, and time. She appears well-developed and well-nourished. No distress.  HENT:  Head: Normocephalic and atraumatic.  Mouth/Throat: No oropharyngeal exudate.  Moist mucous membranes  Eyes: Conjunctivae are normal. Pupils are equal, round, and reactive to light.  Neck: Normal range of motion. Neck supple.  Cardiovascular: Normal rate, normal heart sounds and intact distal pulses.  Exam reveals no friction rub.   No murmur heard. Pulmonary/Chest: Effort normal and breath sounds normal. No respiratory distress. She has no wheezes. She has no rales.    Abdominal: Soft. Bowel sounds are normal. She exhibits no distension. There is no tenderness. There is no rebound and no guarding.    No significant right upper quadrant tenderness to palpation. Negative Murphy's sign. No hepatomegaly.   Musculoskeletal: She exhibits no edema.       Back:  Neurological: She is alert and oriented to person, place, and time.  Skin: Skin is warm. No rash noted.  Nursing note and vitals reviewed.   ED Course  Procedures (including critical care time) Labs Review Labs Reviewed  URINALYSIS, ROUTINE W REFLEX MICROSCOPIC - Abnormal; Notable for the following:    Specific Gravity, Urine 1.004 (*)    Hgb urine dipstick TRACE (*)    All other components within normal limits  URINE MICROSCOPIC-ADD ON - Abnormal; Notable for the following:    Squamous Epithelial / LPF FEW (*)    All other components within normal limits  CBC - Abnormal; Notable for the following:  WBC 14.3 (*)    RBC 3.67 (*)    Hemoglobin 11.4 (*)    HCT 32.8 (*)    All other components within normal limits  COMPREHENSIVE METABOLIC PANEL - Abnormal; Notable for the following:    Sodium 130 (*)    Albumin 3.4 (*)    All other components within normal limits  BRAIN NATRIURETIC PEPTIDE - Abnormal; Notable for the following:    B Natriuretic Peptide 134.9 (*)    All other components within normal limits  POC URINE PREG, ED    Imaging Review Ct Renal Stone Study  04/28/2014   CLINICAL DATA:  Right-sided back pain for 1 week with gross hematuria. History of stones.  EXAM: CT ABDOMEN AND PELVIS WITHOUT CONTRAST  TECHNIQUE: Multidetector CT imaging of the abdomen and pelvis was performed following the standard protocol without IV contrast.  COMPARISON:  None.  FINDINGS: Patchy focal areas of airspace disease in the right lung base could represent pneumonia or chronic inflammatory process. Mild scarring in the lung bases.  Kidneys are symmetrical in size and shape. No hydronephrosis or  hydroureter. No renal, ureteral, or bladder stones identified.  The unenhanced appearance of the liver, spleen, gallbladder, pancreas, adrenal glands, abdominal aorta, inferior vena cava, and retroperitoneal lymph nodes is unremarkable. Small esophageal hiatal hernia. Stomach and small bowel are decompressed. Stool-filled colon without abnormal distention. No free air or free fluid in the abdomen. Minimal umbilical hernia containing fat.  Pelvis: Uterus and ovaries are not enlarged. Bladder wall is not thickened. No free or loculated pelvic fluid collections. No pelvic mass or lymphadenopathy. Appendix is not identified but no inflammatory changes are suggested in the pelvis. Postoperative changes with laminectomies and posterior fixation from L3 through the sacrum an with intervertebral disc prostheses at L3-4 and L4-5 levels. Degenerative changes throughout the lumbar spine. Mild degenerative changes in the hips. No destructive bone lesions.  IMPRESSION: No renal or ureteral stone or obstruction. Small esophageal hiatal hernia. Minimal umbilical hernia. Postoperative and degenerative changes in the lumbar spine. Patchy infiltration in the right lung base may represent acute or chronic inflammatory process.   Electronically Signed   By: Lucienne Capers M.D.   On: 04/28/2014 21:24     EKG Interpretation None      MDM   54 yo F with PMHx of HTN, anxiety, hyponatremia, and chronic LBP s/p 4 discectomies who p/w a 5-6 day history of right flank and lower back pain. See HPI above. On arrival, T 97.57F, HR 90, RR 18, BP 156/88, satting 99% on RA. Exam as above, pt is overall very well-appearing in NAD, with mild R paraspinal tenderness that wraps around flank. No rash.   DDx includes paraspinal muscle spasm/pain, radiculopathy, possibly renal colic given flank pain, radiation to R groin, and report of hematuria, less likely pyelonephritis given absence of other urinary sx or fevers. Must also consider early  zoster, though distribution of reported pain does cross dermatomes but not midline. No rash noted at this time. No h/o AAA, and do not suspect dissection. Will send labs, CT Stone study, and give IV fluids and analgesics. No vaginal bleeding, discharge, or s/s PID, cervicitis, or ovarian torsion. No h/o ovarian cysts.  CBC with WBC 14.3, which is near baseline. CMP with Na 130, pt's baseline 130-135. Normal LFTs and renal function. BNP only minimally elevated at 135. UA with 0-2 RBCs at this time. UPT negative. No signs of pyelo or UTI on UA. Will f/u CT.  CT stone negative. There is a small esophageal hernia but no obstruction. Small area of inflammatory process in R lung but pt has no cough, sputum production, or s/s PNA and suspect this is non=infectious. DDx remains passed renal stone, MSK pain, or possibly early zoster. VSS and pain is well-controlled at this time. Discussed management options with pt and will tx as outpt with NSAIDs, norco PRN breakthrough, and PCP f/u. Pt in agreement. Return precautions discussed in detail.  Clinical Impression: 1. Flank pain   2. Back pain   3. Chronic hyponatremia     Disposition: Discharge  Condition: Good  I have discussed the results, Dx and Tx plan with the pt(& family if present). He/she/they expressed understanding and agree(s) with the plan. Discharge instructions discussed at great length. Strict return precautions discussed and pt &/or family have verbalized understanding of the instructions. No further questions at time of discharge.   Follow Up: Lucianne Lei, MD [address] [place] [address] [phone]   Follow-up in 3-5 days   Pt seen in conjunction with Dr. Garnette Scheuermann, MD 24/09/73 5329  Delora Fuel, MD 92/42/68 [id number]

## 2014-04-30 DIAGNOSIS — K219 Gastro-esophageal reflux disease without esophagitis: Secondary | ICD-10-CM | POA: Diagnosis not present

## 2014-04-30 DIAGNOSIS — R112 Nausea with vomiting, unspecified: Secondary | ICD-10-CM | POA: Diagnosis not present

## 2014-04-30 DIAGNOSIS — I10 Essential (primary) hypertension: Secondary | ICD-10-CM | POA: Diagnosis not present

## 2014-05-01 DIAGNOSIS — I10 Essential (primary) hypertension: Secondary | ICD-10-CM | POA: Diagnosis not present

## 2014-06-04 DIAGNOSIS — M5441 Lumbago with sciatica, right side: Secondary | ICD-10-CM | POA: Diagnosis not present

## 2014-06-05 DIAGNOSIS — M4327 Fusion of spine, lumbosacral region: Secondary | ICD-10-CM | POA: Diagnosis not present

## 2014-06-05 DIAGNOSIS — M5126 Other intervertebral disc displacement, lumbar region: Secondary | ICD-10-CM | POA: Diagnosis not present

## 2014-06-05 DIAGNOSIS — M9973 Connective tissue and disc stenosis of intervertebral foramina of lumbar region: Secondary | ICD-10-CM | POA: Diagnosis not present

## 2014-06-05 DIAGNOSIS — M5441 Lumbago with sciatica, right side: Secondary | ICD-10-CM | POA: Diagnosis not present

## 2014-06-17 DIAGNOSIS — Z6831 Body mass index (BMI) 31.0-31.9, adult: Secondary | ICD-10-CM | POA: Diagnosis not present

## 2014-06-17 DIAGNOSIS — M5441 Lumbago with sciatica, right side: Secondary | ICD-10-CM | POA: Diagnosis not present

## 2014-07-28 DIAGNOSIS — E119 Type 2 diabetes mellitus without complications: Secondary | ICD-10-CM | POA: Diagnosis not present

## 2014-07-28 DIAGNOSIS — I10 Essential (primary) hypertension: Secondary | ICD-10-CM | POA: Diagnosis not present

## 2014-07-28 DIAGNOSIS — F064 Anxiety disorder due to known physiological condition: Secondary | ICD-10-CM | POA: Diagnosis not present

## 2014-08-12 ENCOUNTER — Encounter: Payer: Self-pay | Admitting: Podiatry

## 2014-08-12 ENCOUNTER — Ambulatory Visit (INDEPENDENT_AMBULATORY_CARE_PROVIDER_SITE_OTHER): Payer: Medicare Other | Admitting: Podiatry

## 2014-08-12 VITALS — BP 134/61 | HR 73 | Temp 98.1°F | Resp 14

## 2014-08-12 DIAGNOSIS — M79675 Pain in left toe(s): Secondary | ICD-10-CM | POA: Diagnosis not present

## 2014-08-12 DIAGNOSIS — B351 Tinea unguium: Secondary | ICD-10-CM | POA: Diagnosis not present

## 2014-08-12 NOTE — Progress Notes (Signed)
   Subjective:    Patient ID: Madeline Dickerson, female    DOB: [birth year], 54 y.o.   MRN: [id number]  HPI Patient presents here today with B /l toe pain( right great toe, left great and 2nd toe). Is has been like this for a few years but has gotten worse. She has tried to trim herself and no other treatments done. This patient presents to the office concerned about her nails.  She says she had nine removed previously and the two big toes had nails return.  She says she has painful third toenail left foot which is painful to trim herself.  She desires correction of third toenail.  Review of Systems  Musculoskeletal: Positive for back pain.       Objective:   Physical Exam  Podiatric Exam: Vascular: dorsalis pedis and posterior tibial pulses are palpable bilateral. Capillary return is immediate. Temperature gradient is WNL. Skin turgor WNL  Sensorium: Normal Semmes Weinstein monofilament test. Normal tactile sensation bilaterally. Nail Exam: Pt has thick disfigured discolored nails with subungual debris noted bilateral entire nail hallux.  She has thick incurvated mycotic nail third toe left foot. Ulcer Exam: There is no evidence of ulcer or pre-ulcerative changes or infection. Orthopedic Exam: Muscle tone and strength are WNL. No limitations in general ROM. No crepitus or effusions noted. Foot type and digits show no abnormalities. Bony prominences are unremarkable. Skin: No Porokeratosis. No infection or ulcers  .        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  IE>  Excision of nail and matrix third toe left foot under local anesthesia .  Neosporin/DSD.  Home instructions given.

## 2014-09-29 DIAGNOSIS — M79643 Pain in unspecified hand: Secondary | ICD-10-CM | POA: Diagnosis not present

## 2014-09-29 DIAGNOSIS — E119 Type 2 diabetes mellitus without complications: Secondary | ICD-10-CM | POA: Diagnosis not present

## 2014-10-01 DIAGNOSIS — M160 Bilateral primary osteoarthritis of hip: Secondary | ICD-10-CM | POA: Diagnosis not present

## 2014-10-01 DIAGNOSIS — M7540 Impingement syndrome of unspecified shoulder: Secondary | ICD-10-CM | POA: Diagnosis not present

## 2014-11-04 ENCOUNTER — Encounter (HOSPITAL_COMMUNITY): Payer: Self-pay | Admitting: Vascular Surgery

## 2014-11-04 ENCOUNTER — Emergency Department (HOSPITAL_COMMUNITY)
Admission: EM | Admit: 2014-11-04 | Discharge: 2014-11-04 | Discharge status: Against Medical Advice | Payer: Medicare Other | Attending: Emergency Medicine | Admitting: Emergency Medicine

## 2014-11-04 DIAGNOSIS — F419 Anxiety disorder, unspecified: Secondary | ICD-10-CM | POA: Diagnosis not present

## 2014-11-04 DIAGNOSIS — F329 Major depressive disorder, single episode, unspecified: Secondary | ICD-10-CM | POA: Diagnosis not present

## 2014-11-04 DIAGNOSIS — G8929 Other chronic pain: Secondary | ICD-10-CM | POA: Insufficient documentation

## 2014-11-04 DIAGNOSIS — R05 Cough: Secondary | ICD-10-CM | POA: Insufficient documentation

## 2014-11-04 LAB — ETHANOL: Alcohol, Ethyl (B): <5 mg/dL (ref <5)

## 2014-11-04 LAB — COMPREHENSIVE METABOLIC PANEL
ALT: 14 U/L (ref 14–54)
AST: 21 U/L (ref 15–41)
Albumin: 3.7 g/dL (ref 3.5–5.0)
Alkaline Phosphatase: 80 U/L (ref 38–126)
Anion gap: 10 (ref 5–15)
BUN: 10 mg/dL (ref 6–20)
CO2: 21 mmol/L — ABNORMAL LOW (ref 22–32)
Calcium: 9 mg/dL (ref 8.9–10.3)
Chloride: 96 mmol/L — ABNORMAL LOW (ref 101–111)
Creatinine, Ser: 0.82 mg/dL (ref 0.44–1.00)
GFR calc Af Amer: >60 mL/min (ref >60)
GFR calc non Af Amer: >60 mL/min (ref >60)
Glucose, Bld: 92 mg/dL (ref 65–99)
Potassium: 3.7 mmol/L (ref 3.5–5.1)
Sodium: 127 mmol/L — ABNORMAL LOW (ref 135–145)
Total Bilirubin: 0.7 mg/dL (ref 0.3–1.2)
Total Protein: 7.1 g/dL (ref 6.5–8.1)

## 2014-11-04 LAB — CBC
HCT: 37.1 % (ref 36.0–46.0)
Hemoglobin: 12.4 g/dL (ref 12.0–15.0)
MCH: 30.4 pg (ref 26.0–34.0)
MCHC: 33.4 g/dL (ref 30.0–36.0)
MCV: 90.9 fL (ref 78.0–100.0)
Platelets: 353 10*3/uL (ref 150–400)
RBC: 4.08 MIL/uL (ref 3.87–5.11)
RDW: 13.9 % (ref 11.5–15.5)
WBC: 17.7 10*3/uL — ABNORMAL HIGH (ref 4.0–10.5)

## 2014-11-04 LAB — ACETAMINOPHEN LEVEL: Acetaminophen (Tylenol), Serum: <10 ug/mL — ABNORMAL LOW (ref 10–30)

## 2014-11-04 LAB — RAPID URINE DRUG SCREEN, HOSP PERFORMED
Amphetamines: NOT DETECTED
Barbiturates: NOT DETECTED
Benzodiazepines: POSITIVE — AB
Cocaine: NOT DETECTED
Opiates: NOT DETECTED
Tetrahydrocannabinol: POSITIVE — AB

## 2014-11-04 LAB — SALICYLATE LEVEL: Salicylate Lvl: <4 mg/dL (ref 2.8–30.0)

## 2014-11-04 NOTE — ED Notes (Addendum)
Pt reports to the ED for eval of multiple complaints. She reports increased depression and anxiety. Pt reports she is not SI but she would like to die. She reports she got her Prozac filled but she thinks it got thrown away. Pt also reports she has been coughing a lot or not eating. Pt also reports that they turned her lights off because she had to pay a lot of back taxes. She also reports chronic pain and her medication doesn't help. Pt tearful and states she crys all the time. Pt A&Ox4, resp e/u, and skin warm and dry. RN repeatedly asking if pt is SI/HI and she adamantly states she is not. Only reports increased depression.

## 2014-11-04 NOTE — ED Notes (Signed)
Pt states she cannot wait any longer. Pt ensured RN that she was not suicidal and she promised she would not harm herself. RN attempted to have patient stay but pt states she wants to go home and get in bed. Pt states that she will return tomorrow if she doesn't get any better. Ensured RN that she will return if she develops any worsening symptoms or SI. Pt in NAD and ambulatory.

## 2014-11-05 ENCOUNTER — Emergency Department (HOSPITAL_COMMUNITY)
Admission: EM | Admit: 2014-11-05 | Discharge: 2014-11-05 | Disposition: A | Discharge status: Home / Self Care | Payer: Medicare Other | Attending: Emergency Medicine | Admitting: Emergency Medicine

## 2014-11-05 ENCOUNTER — Emergency Department (HOSPITAL_COMMUNITY): Payer: Medicare Other

## 2014-11-05 ENCOUNTER — Encounter (HOSPITAL_COMMUNITY): Payer: Self-pay | Admitting: Neurology

## 2014-11-05 DIAGNOSIS — Z720 Tobacco use: Secondary | ICD-10-CM | POA: Diagnosis not present

## 2014-11-05 DIAGNOSIS — J159 Unspecified bacterial pneumonia: Secondary | ICD-10-CM | POA: Diagnosis not present

## 2014-11-05 DIAGNOSIS — Z79899 Other long term (current) drug therapy: Secondary | ICD-10-CM | POA: Diagnosis not present

## 2014-11-05 DIAGNOSIS — F32A Depression, unspecified: Secondary | ICD-10-CM

## 2014-11-05 DIAGNOSIS — I10 Essential (primary) hypertension: Secondary | ICD-10-CM | POA: Diagnosis not present

## 2014-11-05 DIAGNOSIS — F419 Anxiety disorder, unspecified: Secondary | ICD-10-CM | POA: Insufficient documentation

## 2014-11-05 DIAGNOSIS — K219 Gastro-esophageal reflux disease without esophagitis: Secondary | ICD-10-CM | POA: Diagnosis not present

## 2014-11-05 DIAGNOSIS — F329 Major depressive disorder, single episode, unspecified: Secondary | ICD-10-CM | POA: Insufficient documentation

## 2014-11-05 DIAGNOSIS — M549 Dorsalgia, unspecified: Secondary | ICD-10-CM | POA: Diagnosis not present

## 2014-11-05 DIAGNOSIS — J189 Pneumonia, unspecified organism: Secondary | ICD-10-CM | POA: Diagnosis not present

## 2014-11-05 DIAGNOSIS — R197 Diarrhea, unspecified: Secondary | ICD-10-CM | POA: Insufficient documentation

## 2014-11-05 DIAGNOSIS — R05 Cough: Secondary | ICD-10-CM | POA: Diagnosis present

## 2014-11-05 DIAGNOSIS — G8929 Other chronic pain: Secondary | ICD-10-CM | POA: Diagnosis not present

## 2014-11-05 DIAGNOSIS — F17210 Nicotine dependence, cigarettes, uncomplicated: Secondary | ICD-10-CM | POA: Diagnosis not present

## 2014-11-05 DIAGNOSIS — J180 Bronchopneumonia, unspecified organism: Secondary | ICD-10-CM | POA: Diagnosis not present

## 2014-11-05 MED ORDER — MELOXICAM 15 MG PO TABS: 15.0000 mg | ORAL_TABLET | Freq: Every day | ORAL | Status: DC

## 2014-11-05 MED ORDER — LEVOFLOXACIN 750 MG PO TABS: 750.0000 mg | ORAL_TABLET | Freq: Every day | ORAL | Status: DC

## 2014-11-05 MED ORDER — LEVOFLOXACIN 750 MG PO TABS
750.0000 mg | ORAL_TABLET | Freq: Once | ORAL | Status: AC
  Administered 2014-11-05: 750 mg via ORAL
  Filled 2014-11-05: qty 1

## 2014-11-05 MED ORDER — FLUOXETINE HCL 40 MG PO CAPS: 40.0000 mg | ORAL_CAPSULE | Freq: Every day | ORAL | Status: DC

## 2014-11-05 MED ORDER — TRAMADOL HCL 50 MG PO TABS
50.0000 mg | ORAL_TABLET | Freq: Once | ORAL | Status: AC
  Administered 2014-11-05: 50 mg via ORAL
  Filled 2014-11-05: qty 1

## 2014-11-05 MED ORDER — LORAZEPAM 1 MG PO TABS: 1.0000 mg | ORAL_TABLET | Freq: Three times a day (TID) | ORAL | Status: DC | PRN

## 2014-11-05 NOTE — Discharge Instructions (Signed)
Take levaquin as directed until gone. Take mobic as needed for pain. Take ativan as needed for anxiety. Take Prozac daily. Refer to attached documents for more information.

## 2014-11-05 NOTE — ED Notes (Addendum)
Upon this RN entering the room to discharge patient, patient requesting tramadol for pain. Advised patient I would let PA Szekalski know about her request for pain meds prior to discharge. Pt advised this RN she is not driving herself home, PA [facility] made aware.

## 2014-11-05 NOTE — ED Notes (Signed)
Pt reports cough, nasal congestion for several days. Has been out of prozac, feeling anxiety. Pt is a x 4.

## 2014-11-05 NOTE — ED Provider Notes (Signed)
CSN: [id number]     Arrival date & time 11/05/14  1601 History   First MD Initiated Contact with Patient 11/05/14 1124     Chief Complaint  Patient presents with  . Cough  . Nasal Congestion  . Anxiety     (Consider location/radiation/quality/duration/timing/severity/associated sxs/prior Treatment) HPI Comments: Pt presents with cough and nasal congestion x 1 week. Has mild SOB and cough is productive of thin sputum. Smoker. Reports having similar symptoms annually with changes in season. Denies fever, chest pain, history of asthma/COPD. History of low back pain and reports coughing has aggravated it. Has also had diarrhea and headache for a few days. Denies abdominal pain, N/V. Has also been out of her Prozac for about 2 weeks and reports feeling anxious and crying a lot. Denies suicidal or homicidal ideations.  The history is provided by the patient.    Past Medical History  Diagnosis Date  . Anxiety   . Hypertension   . Chronic low back pain 1991    disk s/p 4 diskectomies, 5th fursion, then spinal cord stimulator ([facility])  . Bronchitis     2 weeks ago rx regime  . Depression   . GERD (gastroesophageal reflux disease)   . Arthritis   . Rotator cuff disorder    Past Surgical History  Procedure Laterality Date  . Spine surgery  7/ 12 last    5 previous  . Stimulator  12    spine,removed 7/12  . Cesarean section  86  . Lumbar fusion  04/27/2011   Family History  Problem Relation Age of Onset  . Stroke Father   . Hypertension Father    Social History  Substance Use Topics  . Smoking status: Current Every Day Smoker -- 1.00 packs/day for 30 years    Types: Cigarettes  . Smokeless tobacco: Never Used     Comment: prior trial of zyban made her feel weird   . Alcohol Use: No   OB History    No data available     Review of Systems  Constitutional: Positive for appetite change and fatigue. Negative for fever.  HENT: Positive for congestion.   Respiratory: Positive  for cough, shortness of breath and wheezing.        Mild SOB  Cardiovascular: Negative for chest pain.  Gastrointestinal: Positive for diarrhea. Negative for nausea, vomiting and abdominal pain.  Musculoskeletal: Positive for back pain.  Psychiatric/Behavioral: Negative for suicidal ideas. The patient is nervous/anxious.   All other systems reviewed and are negative.     Allergies  Haldol and Naproxen  Home Medications   Prior to Admission medications   Medication Sig Start Date End Date Taking? Authorizing Provider  FLUoxetine (PROZAC) 40 MG capsule Take 40 mg by mouth daily.    Historical Provider, MD  omeprazole (PRILOSEC) 40 MG capsule Take 40 mg by mouth daily.    Historical Provider, MD  promethazine (PHENERGAN) 50 MG tablet TK 1 T PO QD 07/20/14   Historical Provider, MD  valsartan-hydrochlorothiazide (DIOVAN-HCT) 160-12.5 MG per tablet Take 1 tablet by mouth daily.    Historical Provider, MD   BP 129/91 mmHg  Pulse 85  Temp(Src) 98.3 F (36.8 C) (Oral)  Resp 18  SpO2 98%  LMP 08/28/2012 Physical Exam  Constitutional: She is oriented to person, place, and time. She appears well-developed and well-nourished. No distress.  HENT:  Head: Normocephalic and atraumatic.  Eyes: Conjunctivae and EOM are normal.  Neck: Normal range of motion.  Cardiovascular: Normal rate,  regular rhythm and normal heart sounds.  Exam reveals no gallop and no friction rub.   No murmur heard. Pulmonary/Chest: Effort normal. No respiratory distress. She has wheezes. She has no rales. She exhibits no tenderness.  Abdominal: Soft. She exhibits no distension. There is no tenderness. There is no rebound.  Musculoskeletal: Normal range of motion.  Neurological: She is alert and oriented to person, place, and time. Coordination normal.  Speech is goal-oriented. Moves limbs without ataxia.   Skin: Skin is warm and dry.  Psychiatric: Her behavior is normal. Her mood appears anxious. Her affect is labile.  She expresses no homicidal and no suicidal ideation.  Has been out of Prozac and has not taken it for approximately 2 weeks  Nursing note and vitals reviewed.   ED Course  Procedures (including critical care time) Labs Review Labs Reviewed - No data to display  Imaging Review Dg Chest 2 View  11/05/2014   CLINICAL DATA:  Cough, congestion, sore throat. Thirty pack-year smoking history.  EXAM: CHEST  2 VIEW  COMPARISON:  09/08/2013  FINDINGS: There is central airway thickening consistent with bronchitis, with a superimposed medial right base opacity with mild volume loss. Chronic hyperinflation. No edema, effusion, or pneumothorax. Normal heart size and mediastinal contours.  IMPRESSION: 1. Right middle lobe bronchopneumonia. Followup PA and lateral chest X-ray is recommended in 3-4 weeks following trial of antibiotic therapy to ensure resolution and exclude underlying malignancy. 2. Background findings of COPD.   Electronically Signed   By: Monte Fantasia M.D.   On: 11/05/2014 10:24   I have personally reviewed and evaluated these images and lab results as part of my medical decision-making.   EKG Interpretation None      MDM   Final diagnoses:  CAP (community acquired pneumonia)  Depression    11:30 AM Patient's chest xray shows bronchopneumonia. Vitals stable and patient afebrile. Patient will be discharged with levaquin, prozac, and ativan.     Alvina Chou, PA-C 11/05/14 [place], MD 11/13/14 [phone]

## 2014-11-25 DIAGNOSIS — M7541 Impingement syndrome of right shoulder: Secondary | ICD-10-CM | POA: Diagnosis not present

## 2014-12-04 DIAGNOSIS — G894 Chronic pain syndrome: Secondary | ICD-10-CM | POA: Diagnosis not present

## 2014-12-04 DIAGNOSIS — R252 Cramp and spasm: Secondary | ICD-10-CM | POA: Diagnosis not present

## 2014-12-04 DIAGNOSIS — Z720 Tobacco use: Secondary | ICD-10-CM | POA: Diagnosis not present

## 2015-01-23 DIAGNOSIS — Z23 Encounter for immunization: Secondary | ICD-10-CM | POA: Diagnosis not present

## 2015-01-23 DIAGNOSIS — N3281 Overactive bladder: Secondary | ICD-10-CM | POA: Diagnosis not present

## 2015-01-23 DIAGNOSIS — M79643 Pain in unspecified hand: Secondary | ICD-10-CM | POA: Diagnosis not present

## 2015-01-23 DIAGNOSIS — I10 Essential (primary) hypertension: Secondary | ICD-10-CM | POA: Diagnosis not present

## 2015-02-11 DIAGNOSIS — M75121 Complete rotator cuff tear or rupture of right shoulder, not specified as traumatic: Secondary | ICD-10-CM | POA: Diagnosis not present

## 2015-02-11 DIAGNOSIS — M75111 Incomplete rotator cuff tear or rupture of right shoulder, not specified as traumatic: Secondary | ICD-10-CM | POA: Diagnosis not present

## 2015-03-12 DIAGNOSIS — Z79899 Other long term (current) drug therapy: Secondary | ICD-10-CM | POA: Diagnosis not present

## 2015-04-28 ENCOUNTER — Emergency Department (HOSPITAL_COMMUNITY): Payer: Medicare Other

## 2015-04-28 ENCOUNTER — Encounter (HOSPITAL_COMMUNITY): Payer: Self-pay | Admitting: Cardiology

## 2015-04-28 ENCOUNTER — Emergency Department (HOSPITAL_COMMUNITY)
Admission: EM | Admit: 2015-04-28 | Discharge: 2015-04-28 | Disposition: A | Discharge status: Home / Self Care | Payer: Medicare Other | Source: Home / Self Care | Attending: Emergency Medicine | Admitting: Emergency Medicine

## 2015-04-28 DIAGNOSIS — G8929 Other chronic pain: Secondary | ICD-10-CM

## 2015-04-28 DIAGNOSIS — Z8659 Personal history of other mental and behavioral disorders: Secondary | ICD-10-CM

## 2015-04-28 DIAGNOSIS — J9601 Acute respiratory failure with hypoxia: Secondary | ICD-10-CM | POA: Diagnosis not present

## 2015-04-28 DIAGNOSIS — E663 Overweight: Secondary | ICD-10-CM

## 2015-04-28 DIAGNOSIS — Z79899 Other long term (current) drug therapy: Secondary | ICD-10-CM | POA: Insufficient documentation

## 2015-04-28 DIAGNOSIS — K219 Gastro-esophageal reflux disease without esophagitis: Secondary | ICD-10-CM | POA: Insufficient documentation

## 2015-04-28 DIAGNOSIS — A419 Sepsis, unspecified organism: Secondary | ICD-10-CM | POA: Diagnosis not present

## 2015-04-28 DIAGNOSIS — R509 Fever, unspecified: Secondary | ICD-10-CM | POA: Diagnosis not present

## 2015-04-28 DIAGNOSIS — F17210 Nicotine dependence, cigarettes, uncomplicated: Secondary | ICD-10-CM | POA: Insufficient documentation

## 2015-04-28 DIAGNOSIS — J189 Pneumonia, unspecified organism: Secondary | ICD-10-CM | POA: Diagnosis not present

## 2015-04-28 DIAGNOSIS — I10 Essential (primary) hypertension: Secondary | ICD-10-CM

## 2015-04-28 DIAGNOSIS — J45901 Unspecified asthma with (acute) exacerbation: Secondary | ICD-10-CM

## 2015-04-28 DIAGNOSIS — E861 Hypovolemia: Secondary | ICD-10-CM | POA: Diagnosis not present

## 2015-04-28 DIAGNOSIS — E871 Hypo-osmolality and hyponatremia: Secondary | ICD-10-CM | POA: Diagnosis not present

## 2015-04-28 DIAGNOSIS — R0602 Shortness of breath: Secondary | ICD-10-CM | POA: Diagnosis not present

## 2015-04-28 DIAGNOSIS — J45902 Unspecified asthma with status asthmaticus: Secondary | ICD-10-CM | POA: Diagnosis not present

## 2015-04-28 DIAGNOSIS — R05 Cough: Secondary | ICD-10-CM | POA: Diagnosis not present

## 2015-04-28 MED ORDER — ALBUTEROL SULFATE HFA 108 (90 BASE) MCG/ACT IN AERS
2.0000 | INHALATION_SPRAY | RESPIRATORY_TRACT | Status: DC | PRN
  Administered 2015-04-28: 2 via RESPIRATORY_TRACT
  Filled 2015-04-28: qty 6.7

## 2015-04-28 MED ORDER — PREDNISONE 10 MG PO TABS: 10.0000 mg | ORAL_TABLET | Freq: Once | ORAL | Status: DC

## 2015-04-28 MED ORDER — PREDNISONE 20 MG PO TABS
60.0000 mg | ORAL_TABLET | Freq: Once | ORAL | Status: AC
  Administered 2015-04-28: 60 mg via ORAL
  Filled 2015-04-28: qty 3

## 2015-04-28 MED ORDER — IPRATROPIUM BROMIDE 0.02 % IN SOLN
0.5000 mg | Freq: Once | RESPIRATORY_TRACT | Status: AC
  Administered 2015-04-28: 0.5 mg via RESPIRATORY_TRACT
  Filled 2015-04-28: qty 2.5

## 2015-04-28 MED ORDER — AEROCHAMBER PLUS W/MASK MISC
1.0000 | Freq: Once | Status: AC
  Administered 2015-04-28: 1
  Filled 2015-04-28: qty 1

## 2015-04-28 MED ORDER — ALBUTEROL SULFATE (2.5 MG/3ML) 0.083% IN NEBU
5.0000 mg | INHALATION_SOLUTION | Freq: Once | RESPIRATORY_TRACT | Status: AC
  Administered 2015-04-28: 5 mg via RESPIRATORY_TRACT
  Filled 2015-04-28: qty 6

## 2015-04-28 NOTE — ED Notes (Signed)
Pt reports chest congestion, fatigue and fever at home. Also reports she has been falling at home.

## 2015-04-28 NOTE — ED Provider Notes (Signed)
CSN: [id number]     Arrival date & time 04/28/15  0844 History   First MD Initiated Contact with Patient 04/28/15 [phone]     Chief Complaint  Patient presents with  . Nasal Congestion  . Cough     (Consider location/radiation/quality/duration/timing/severity/associated sxs/prior Treatment) HPI   Madeline Dickerson is a 55 y.o. female who is here for evaluation of shortness of breath. She reports improvement when she takes her inhaler, but feels that she needs some prednisone. She also complains of nasal congestion, which causes her to breathe through her mouth. She reports having some sores in her nose recently. She denies fever, chills, nausea or vomiting. She's had mild nonproductive cough. There are no other known modifying factors.   Past Medical History  Diagnosis Date  . Anxiety   . Hypertension   . Chronic low back pain 1991    disk s/p 4 diskectomies, 5th fursion, then spinal cord stimulator ([facility])  . Bronchitis     2 weeks ago rx regime  . Depression   . GERD (gastroesophageal reflux disease)   . Arthritis   . Rotator cuff disorder    Past Surgical History  Procedure Laterality Date  . Spine surgery  7/ 12 last    5 previous  . Stimulator  12    spine,removed 7/12  . Cesarean section  86  . Lumbar fusion  04/27/2011   Family History  Problem Relation Age of Onset  . Stroke Father   . Hypertension Father    Social History  Substance Use Topics  . Smoking status: Current Every Day Smoker -- 1.00 packs/day for 30 years    Types: Cigarettes  . Smokeless tobacco: Never Used     Comment: prior trial of zyban made her feel weird   . Alcohol Use: No   OB History    No data available     Review of Systems  All other systems reviewed and are negative.     Allergies  Haldol and Naproxen  Home Medications   Prior to Admission medications   Medication Sig Start Date End Date Taking? Authorizing Provider  FLUoxetine (PROZAC) 40 MG capsule Take 1 capsule (40 mg  total) by mouth daily. 11/05/14  Yes Kaitlyn Szekalski, PA-C  LYRICA 200 MG capsule Take 200 mg by mouth 2 (two) times daily. 11/01/14  Yes Historical Provider, MD  omeprazole (PRILOSEC) 40 MG capsule Take 40 mg by mouth daily.   Yes Historical Provider, MD  promethazine (PHENERGAN) 50 MG tablet Take 50 mg by mouth every 6 (six) hours as needed for nausea or vomiting.   Yes Historical Provider, MD  valsartan-hydrochlorothiazide (DIOVAN-HCT) 160-12.5 MG per tablet Take 1 tablet by mouth daily.   Yes Historical Provider, MD   BP 116/71 mmHg  Pulse 83  Temp(Src) 98.2 F (36.8 C) (Oral)  SpO2 97%  LMP 08/28/2012 Physical Exam  Constitutional: She is oriented to person, place, and time. She appears well-developed.  She is overweight.  HENT:  Head: Normocephalic and atraumatic.  Right Ear: External ear normal.  Left Ear: External ear normal.  Dry oral mucous membranes.  Eyes: Conjunctivae and EOM are normal. Pupils are equal, round, and reactive to light.  Neck: Normal range of motion and phonation normal. Neck supple.  Cardiovascular: Normal rate, regular rhythm and normal heart sounds.   Pulmonary/Chest: Effort normal. She exhibits no bony tenderness.  Somewhat decreased amount bilaterally without audible wheezes, Rales or rhonchi.  Abdominal: Soft. There is no tenderness.  Musculoskeletal: Normal range of motion.  Neurological: She is alert and oriented to person, place, and time. No cranial nerve deficit or sensory deficit. She exhibits normal muscle tone. Coordination normal.  Skin: Skin is warm, dry and intact.  Psychiatric: She has a normal mood and affect. Her behavior is normal. Judgment and thought content normal.  Nursing note and vitals reviewed.   ED Course  Procedures (including critical care time) Medications  albuterol (PROVENTIL HFA;VENTOLIN HFA) 108 (90 Base) MCG/ACT inhaler 2 puff (not administered)  aerochamber plus with mask device 1 each (not administered)   predniSONE (DELTASONE) tablet 60 mg (60 mg Oral Given 04/28/15 1013)  albuterol (PROVENTIL) (2.5 MG/3ML) 0.083% nebulizer solution 5 mg (5 mg Nebulization Given 04/28/15 1013)  ipratropium (ATROVENT) nebulizer solution 0.5 mg (0.5 mg Nebulization Given 04/28/15 1014)    Patient Vitals for the past 24 hrs:  BP Temp Temp src Pulse SpO2  04/28/15 1145 126/59 mmHg - - 113 91 %  04/28/15 1100 (!) 107/54 mmHg - - 96 93 %  04/28/15 0910 116/71 mmHg 98.2 F (36.8 C) Oral 83 97 %    12:15 PM Reevaluation with update and discussion. After initial assessment and treatment, an updated evaluation reveals She feels better at this time. No respiratory distress. Albuterol inhaler given. Findings discussed with the patient and all questions were answered. [place] Review Labs Reviewed - No data to display  Imaging Review Dg Chest 2 View  04/28/2015  CLINICAL DATA:  Chest congestion, fatigue and fever at home for 2 weeks, falling, history hypertension, smoking EXAM: CHEST  2 VIEW COMPARISON:  11/05/2014 FINDINGS: Minimal enlargement of cardiac silhouette with pulmonary vascular congestion. Mediastinal contours normal. Diffuse interstitial infiltrates bilaterally question pulmonary edema though atypical infection could have a similar appearance. No pleural effusion or pneumothorax. Bones unremarkable. IMPRESSION: Minimal enlargement of cardiac silhouette with pulmonary vascular congestion. New interstitial infiltrates diffusely throughout both lungs question pulmonary edema though atypical infection not excluded. Electronically Signed   By: Lavonia Dana M.D.   On: 04/28/2015 09:58   I have personally reviewed and evaluated these images and lab results as part of my medical decision-making.   EKG Interpretation None      MDM   Final diagnoses:  Asthma exacerbation    Elevation consistent with asthma exacerbation. Doubt pneumonia, PE, or serious bacterial infection.  Nursing Notes  Reviewed/ Care Coordinated Applicable Imaging Reviewed Interpretation of Laboratory Data incorporated into ED treatment  The patient appears reasonably screened and/or stabilized for discharge and I doubt any other medical condition or other [facility] requiring further screening, evaluation, or treatment in the ED at this time prior to discharge.  Plan: Home Medications- albuterol, prednisone taper; Home Treatments- rest, fluids; return here if the recommended treatment, does not improve the symptoms; Recommended follow up- PCP, when necessary     Daleen Bo, MD 04/28/15 1216

## 2015-04-28 NOTE — Discharge Instructions (Signed)

## 2015-04-30 ENCOUNTER — Emergency Department (HOSPITAL_COMMUNITY): Payer: Medicare Other

## 2015-04-30 ENCOUNTER — Inpatient Hospital Stay (HOSPITAL_COMMUNITY)
Admission: EM | Admit: 2015-04-30 | Discharge: 2015-05-04 | DRG: 871 | Disposition: A | Discharge status: Home / Self Care | Payer: Medicare Other | Attending: Internal Medicine | Admitting: Internal Medicine

## 2015-04-30 ENCOUNTER — Encounter (HOSPITAL_COMMUNITY): Payer: Self-pay | Admitting: Cardiology

## 2015-04-30 DIAGNOSIS — M1990 Unspecified osteoarthritis, unspecified site: Secondary | ICD-10-CM | POA: Diagnosis present

## 2015-04-30 DIAGNOSIS — J189 Pneumonia, unspecified organism: Secondary | ICD-10-CM | POA: Diagnosis not present

## 2015-04-30 DIAGNOSIS — T502X5A Adverse effect of carbonic-anhydrase inhibitors, benzothiadiazides and other diuretics, initial encounter: Secondary | ICD-10-CM | POA: Diagnosis present

## 2015-04-30 DIAGNOSIS — J9601 Acute respiratory failure with hypoxia: Secondary | ICD-10-CM | POA: Diagnosis not present

## 2015-04-30 DIAGNOSIS — E876 Hypokalemia: Secondary | ICD-10-CM | POA: Diagnosis present

## 2015-04-30 DIAGNOSIS — J45902 Unspecified asthma with status asthmaticus: Secondary | ICD-10-CM | POA: Diagnosis not present

## 2015-04-30 DIAGNOSIS — F32A Depression, unspecified: Secondary | ICD-10-CM | POA: Diagnosis present

## 2015-04-30 DIAGNOSIS — D649 Anemia, unspecified: Secondary | ICD-10-CM | POA: Diagnosis present

## 2015-04-30 DIAGNOSIS — E861 Hypovolemia: Secondary | ICD-10-CM | POA: Diagnosis present

## 2015-04-30 DIAGNOSIS — K219 Gastro-esophageal reflux disease without esophagitis: Secondary | ICD-10-CM | POA: Diagnosis present

## 2015-04-30 DIAGNOSIS — Z720 Tobacco use: Secondary | ICD-10-CM

## 2015-04-30 DIAGNOSIS — F329 Major depressive disorder, single episode, unspecified: Secondary | ICD-10-CM | POA: Diagnosis present

## 2015-04-30 DIAGNOSIS — M545 Other chronic pain: Secondary | ICD-10-CM | POA: Diagnosis present

## 2015-04-30 DIAGNOSIS — G8929 Other chronic pain: Secondary | ICD-10-CM | POA: Diagnosis present

## 2015-04-30 DIAGNOSIS — I10 Essential (primary) hypertension: Secondary | ICD-10-CM | POA: Diagnosis present

## 2015-04-30 DIAGNOSIS — E538 Deficiency of other specified B group vitamins: Secondary | ICD-10-CM | POA: Diagnosis present

## 2015-04-30 DIAGNOSIS — F17210 Nicotine dependence, cigarettes, uncomplicated: Secondary | ICD-10-CM | POA: Diagnosis present

## 2015-04-30 DIAGNOSIS — M797 Fibromyalgia: Secondary | ICD-10-CM | POA: Diagnosis present

## 2015-04-30 DIAGNOSIS — F419 Anxiety disorder, unspecified: Secondary | ICD-10-CM | POA: Diagnosis not present

## 2015-04-30 DIAGNOSIS — E871 Hypo-osmolality and hyponatremia: Secondary | ICD-10-CM | POA: Diagnosis present

## 2015-04-30 DIAGNOSIS — K529 Noninfective gastroenteritis and colitis, unspecified: Secondary | ICD-10-CM | POA: Diagnosis present

## 2015-04-30 DIAGNOSIS — B9620 Unspecified Escherichia coli [E. coli] as the cause of diseases classified elsewhere: Secondary | ICD-10-CM | POA: Diagnosis present

## 2015-04-30 DIAGNOSIS — J984 Other disorders of lung: Secondary | ICD-10-CM | POA: Diagnosis present

## 2015-04-30 DIAGNOSIS — A419 Sepsis, unspecified organism: Principal | ICD-10-CM | POA: Diagnosis present

## 2015-04-30 DIAGNOSIS — R05 Cough: Secondary | ICD-10-CM | POA: Diagnosis not present

## 2015-04-30 DIAGNOSIS — R0602 Shortness of breath: Secondary | ICD-10-CM | POA: Diagnosis not present

## 2015-04-30 DIAGNOSIS — R0600 Dyspnea, unspecified: Secondary | ICD-10-CM | POA: Diagnosis present

## 2015-04-30 DIAGNOSIS — R300 Dysuria: Secondary | ICD-10-CM | POA: Diagnosis present

## 2015-04-30 HISTORY — DX: Other asthma: J45.998

## 2015-04-30 HISTORY — DX: Fibromyalgia: M79.7

## 2015-04-30 HISTORY — DX: Pneumonia, unspecified organism: J18.9

## 2015-04-30 LAB — CBC
HCT: 30.4 % — ABNORMAL LOW (ref 36.0–46.0)
Hemoglobin: 10.6 g/dL — ABNORMAL LOW (ref 12.0–15.0)
MCH: 29.9 pg (ref 26.0–34.0)
MCHC: 34.9 g/dL (ref 30.0–36.0)
MCV: 85.6 fL (ref 78.0–100.0)
Platelets: 472 10*3/uL — ABNORMAL HIGH (ref 150–400)
RBC: 3.55 MIL/uL — ABNORMAL LOW (ref 3.87–5.11)
RDW: 15.2 % (ref 11.5–15.5)
WBC: 17.6 10*3/uL — ABNORMAL HIGH (ref 4.0–10.5)

## 2015-04-30 LAB — I-STAT ARTERIAL BLOOD GAS, ED
Acid-base deficit: 1 mmol/L (ref 0.0–2.0)
Bicarbonate: 22.7 mEq/L (ref 20.0–24.0)
O2 Saturation: 96 %
Patient temperature: 98.6
TCO2: 24 mmol/L (ref 0–100)
pCO2 arterial: 33 mmHg — ABNORMAL LOW (ref 35.0–45.0)
pH, Arterial: 7.446 (ref 7.350–7.450)
pO2, Arterial: 77 mmHg — ABNORMAL LOW (ref 80.0–100.0)

## 2015-04-30 LAB — BASIC METABOLIC PANEL
Anion gap: 15 (ref 5–15)
BUN: 8 mg/dL (ref 6–20)
CO2: 22 mmol/L (ref 22–32)
Calcium: 8.8 mg/dL — ABNORMAL LOW (ref 8.9–10.3)
Chloride: 92 mmol/L — ABNORMAL LOW (ref 101–111)
Creatinine, Ser: 0.8 mg/dL (ref 0.44–1.00)
GFR calc Af Amer: >60 mL/min (ref >60)
GFR calc non Af Amer: >60 mL/min (ref >60)
Glucose, Bld: 113 mg/dL — ABNORMAL HIGH (ref 65–99)
Potassium: 4 mmol/L (ref 3.5–5.1)
Sodium: 129 mmol/L — ABNORMAL LOW (ref 135–145)

## 2015-04-30 LAB — PROTIME-INR
INR: 1.23 (ref 0.00–1.49)
Prothrombin Time: 15.7 seconds — ABNORMAL HIGH (ref 11.6–15.2)

## 2015-04-30 LAB — URINALYSIS, ROUTINE W REFLEX MICROSCOPIC
Bilirubin Urine: NEGATIVE
Glucose, UA: NEGATIVE mg/dL
Hgb urine dipstick: NEGATIVE
Ketones, ur: NEGATIVE mg/dL
Leukocytes, UA: NEGATIVE
Nitrite: NEGATIVE
Protein, ur: NEGATIVE mg/dL
Specific Gravity, Urine: 1.005 (ref 1.005–1.030)
pH: 6.5 (ref 5.0–8.0)

## 2015-04-30 LAB — BRAIN NATRIURETIC PEPTIDE: B Natriuretic Peptide: 240.6 pg/mL — ABNORMAL HIGH (ref 0.0–100.0)

## 2015-04-30 LAB — I-STAT TROPONIN, ED: Troponin i, poc: 0 ng/mL (ref 0.00–0.08)

## 2015-04-30 MED ORDER — ALBUTEROL (5 MG/ML) CONTINUOUS INHALATION SOLN
10.0000 mg/h | INHALATION_SOLUTION | Freq: Once | RESPIRATORY_TRACT | Status: AC
  Administered 2015-04-30: 10 mg/h via RESPIRATORY_TRACT
  Filled 2015-04-30: qty 20

## 2015-04-30 MED ORDER — DEXTROSE 5 % IV SOLN
1.0000 g | Freq: Once | INTRAVENOUS | Status: DC
  Administered 2015-04-30: 1 g via INTRAVENOUS
  Filled 2015-04-30: qty 10

## 2015-04-30 MED ORDER — DEXTROSE 5 % IV SOLN: 500.0000 mg | Freq: Once | INTRAVENOUS | Status: DC

## 2015-04-30 MED ORDER — DEXTROSE 5 % IV SOLN
500.0000 mg | INTRAVENOUS | Status: DC
  Administered 2015-04-30 – 2015-05-03 (×3): 500 mg via INTRAVENOUS
  Filled 2015-04-30 (×4): qty 500

## 2015-04-30 MED ORDER — DEXTROSE 5 % IV SOLN
1.0000 g | INTRAVENOUS | Status: DC
  Administered 2015-05-01 – 2015-05-02 (×2): 1 g via INTRAVENOUS
  Filled 2015-04-30 (×3): qty 10

## 2015-04-30 MED ORDER — IRBESARTAN 150 MG PO TABS
150.0000 mg | ORAL_TABLET | Freq: Every day | ORAL | Status: DC
  Administered 2015-05-01 – 2015-05-04 (×4): 150 mg via ORAL
  Filled 2015-04-30 (×4): qty 1

## 2015-04-30 MED ORDER — ALBUTEROL SULFATE (2.5 MG/3ML) 0.083% IN NEBU: 5.0000 mg | INHALATION_SOLUTION | RESPIRATORY_TRACT | Status: DC | PRN

## 2015-04-30 MED ORDER — IBUPROFEN 200 MG PO TABS
400.0000 mg | ORAL_TABLET | ORAL | Status: DC | PRN
  Administered 2015-05-01 (×3): 400 mg via ORAL
  Filled 2015-04-30 (×3): qty 2

## 2015-04-30 MED ORDER — METHYLPREDNISOLONE SODIUM SUCC 125 MG IJ SOLR
60.0000 mg | Freq: Three times a day (TID) | INTRAMUSCULAR | Status: DC
Start: 2015-04-30 — End: 2015-05-03
  Administered 2015-04-30 – 2015-05-03 (×6): 60 mg via INTRAVENOUS
  Filled 2015-04-30 (×7): qty 2

## 2015-04-30 MED ORDER — IPRATROPIUM-ALBUTEROL 0.5-2.5 (3) MG/3ML IN SOLN
3.0000 mL | RESPIRATORY_TRACT | Status: DC
  Administered 2015-05-01 (×3): 3 mL via RESPIRATORY_TRACT
  Filled 2015-04-30 (×3): qty 3

## 2015-04-30 MED ORDER — PREGABALIN 100 MG PO CAPS
200.0000 mg | ORAL_CAPSULE | Freq: Two times a day (BID) | ORAL | Status: DC
  Administered 2015-04-30 – 2015-05-04 (×8): 200 mg via ORAL
  Filled 2015-04-30 (×8): qty 2

## 2015-04-30 MED ORDER — ALBUTEROL SULFATE (2.5 MG/3ML) 0.083% IN NEBU
5.0000 mg | INHALATION_SOLUTION | Freq: Once | RESPIRATORY_TRACT | Status: AC
  Administered 2015-04-30: 5 mg via RESPIRATORY_TRACT
  Filled 2015-04-30: qty 6

## 2015-04-30 MED ORDER — DM-GUAIFENESIN ER 30-600 MG PO TB12
1.0000 | ORAL_TABLET | Freq: Two times a day (BID) | ORAL | Status: DC
  Administered 2015-04-30 – 2015-05-04 (×8): 1 via ORAL
  Filled 2015-04-30 (×8): qty 1

## 2015-04-30 MED ORDER — NICOTINE 21 MG/24HR TD PT24
21.0000 mg | MEDICATED_PATCH | Freq: Every day | TRANSDERMAL | Status: DC
  Administered 2015-04-30 – 2015-05-03 (×3): 21 mg via TRANSDERMAL
  Filled 2015-04-30 (×5): qty 1

## 2015-04-30 MED ORDER — ENOXAPARIN SODIUM 40 MG/0.4ML ~~LOC~~ SOLN
40.0000 mg | SUBCUTANEOUS | Status: DC
Start: 2015-04-30 — End: 2015-05-04
  Administered 2015-05-01 – 2015-05-04 (×4): 40 mg via SUBCUTANEOUS
  Filled 2015-04-30 (×4): qty 0.4

## 2015-04-30 MED ORDER — PROMETHAZINE HCL 25 MG PO TABS: 50.0000 mg | ORAL_TABLET | Freq: Four times a day (QID) | ORAL | Status: DC | PRN

## 2015-04-30 MED ORDER — BUPRENORPHINE HCL-NALOXONE HCL 8-2 MG SL FILM
1.0000 | ORAL_FILM | Freq: Two times a day (BID) | SUBLINGUAL | Status: DC
  Administered 2015-05-01 – 2015-05-04 (×7): 1 via SUBLINGUAL
  Filled 2015-04-30: qty 1

## 2015-04-30 MED ORDER — IPRATROPIUM BROMIDE 0.02 % IN SOLN
0.5000 mg | Freq: Once | RESPIRATORY_TRACT | Status: AC
  Administered 2015-04-30: 0.5 mg via RESPIRATORY_TRACT
  Filled 2015-04-30: qty 2.5

## 2015-04-30 MED ORDER — PANTOPRAZOLE SODIUM 40 MG PO TBEC
40.0000 mg | DELAYED_RELEASE_TABLET | Freq: Every day | ORAL | Status: DC
  Administered 2015-04-30 – 2015-05-04 (×5): 40 mg via ORAL
  Filled 2015-04-30 (×5): qty 1

## 2015-04-30 MED ORDER — FLUOXETINE HCL 20 MG PO CAPS
40.0000 mg | ORAL_CAPSULE | Freq: Every day | ORAL | Status: DC
  Administered 2015-05-01 – 2015-05-04 (×4): 40 mg via ORAL
  Filled 2015-04-30 (×5): qty 2

## 2015-04-30 NOTE — ED Provider Notes (Signed)
CSN: [id number]     Arrival date & time 04/30/15  1458 History   First MD Initiated Contact with Patient 04/30/15 1914     Chief Complaint  Patient presents with  . Shortness of Breath    Patient is a 55 y.o. female presenting with shortness of breath. The history is provided by the patient.  Shortness of Breath Severity:  Moderate Onset quality:  Gradual Duration:  1 month Timing:  Intermittent Progression:  Worsening Chronicity:  New Relieved by:  Rest Worsened by:  Activity Associated symptoms: cough and vomiting   Associated symptoms: no chest pain and no fever   Patient is a smoker, h/o asthma who presents with "flu like symptoms" for past month, worse over past several days She reports cough/sob/wheezing No recent fever She has had some recent vomiting/diarrhea No active CP She was seen in ED on 3/7 for SOB, felt improved and went home to take albuterol and prednisone She took her prednisone today She reports her SOB is worse with exertion and improved with rest No recent antibiotics   Past Medical History  Diagnosis Date  . Anxiety   . Hypertension   . Chronic low back pain 1991    disk s/p 4 diskectomies, 5th fursion, then spinal cord stimulator ([facility])  . Bronchitis     2 weeks ago rx regime  . Depression   . GERD (gastroesophageal reflux disease)   . Arthritis   . Rotator cuff disorder    Past Surgical History  Procedure Laterality Date  . Spine surgery  7/ 12 last    5 previous  . Stimulator  12    spine,removed 7/12  . Cesarean section  84  . Lumbar fusion  04/27/2011   Family History  Problem Relation Age of Onset  . Stroke Father   . Hypertension Father    Social History  Substance Use Topics  . Smoking status: Current Every Day Smoker -- 1.00 packs/day for 30 years    Types: Cigarettes  . Smokeless tobacco: Never Used     Comment: prior trial of zyban made her feel weird   . Alcohol Use: No   OB History    No data available      Review of Systems  Constitutional: Negative for fever.  Respiratory: Positive for cough and shortness of breath.   Cardiovascular: Negative for chest pain.  Gastrointestinal: Positive for vomiting.  All other systems reviewed and are negative.     Allergies  Haldol and Naproxen  Home Medications   Prior to Admission medications   Medication Sig Start Date End Date Taking? Authorizing Provider  FLUoxetine (PROZAC) 40 MG capsule Take 1 capsule (40 mg total) by mouth daily. 11/05/14  Yes Kaitlyn Szekalski, PA-C  ibuprofen (ADVIL,MOTRIN) 200 MG tablet Take 400 mg by mouth every 4 (four) hours as needed for fever or moderate pain.   Yes Historical Provider, MD  LYRICA 200 MG capsule Take 200 mg by mouth 2 (two) times daily. 11/01/14  Yes Historical Provider, MD  omeprazole (PRILOSEC) 40 MG capsule Take 40 mg by mouth daily.   Yes Historical Provider, MD  predniSONE (DELTASONE) 10 MG tablet Take 1 tablet (10 mg total) by mouth once. Take q day 6,5,4,3,2,1 04/28/15  Yes Daleen Bo, MD  promethazine (PHENERGAN) 50 MG tablet Take 50 mg by mouth every 6 (six) hours as needed for nausea or vomiting.   Yes Historical Provider, MD  SUBOXONE 8-2 MG FILM Place 1 Film under the tongue  2 (two) times daily. 04/30/15  Yes Historical Provider, MD  valsartan-hydrochlorothiazide (DIOVAN-HCT) 160-12.5 MG per tablet Take 1 tablet by mouth daily. Reported on 04/30/2015   Yes Historical Provider, MD   BP 141/72 mmHg  Pulse 72  Temp(Src) 97.6 F (36.4 C) (Oral)  Resp 17  Wt 87.544 kg  SpO2 94%  LMP 08/28/2012 Physical Exam CONSTITUTIONAL: Well developed/well nourished, tachypneic HEAD: Normocephalic/atraumatic EYES: EOMI/PERRL ENMT: Mucous membranes moist NECK: supple no meningeal signs SPINE/BACK:entire spine nontender CV: S1/S2 noted, no murmurs/rubs/gallops noted LUNGS: wheezing bilaterally in all lung fields, tachypneic ABDOMEN: soft, nontender, no rebound or guarding, bowel sounds noted  throughout abdomen GU:no cva tenderness NEURO: Pt is awake/alert/appropriate, moves all extremitiesx4.  No facial droop.   EXTREMITIES: pulses normal/equal, full ROM, pitting edema noted to LE SKIN: warm, color normal PSYCH: Mildly anxious  ED Course  Procedures  CRITICAL CARE Performed by: Sharyon Cable Total critical care time: 35 minutes Critical care time was exclusive of separately billable procedures and treating other patients. Critical care was necessary to treat or prevent imminent or life-threatening deterioration. Critical care was time spent personally by me on the following activities: development of treatment plan with patient and/or surrogate as well as nursing, discussions with consultants, evaluation of patient's response to treatment, examination of patient, obtaining history from patient or surrogate, ordering and performing treatments and interventions, ordering and review of laboratory studies, ordering and review of radiographic studies, pulse oximetry and re-evaluation of patient's condition. PATIENT WITH STATUST ASTHMATICUS, REQUIRING MULTIPLE NEBULIZED TREATMENTS SHE HAD EPISODE OF HYPOXIA TO 77%  IN THE ER PT STABILIZED IN THE ER  Labs Review Labs Reviewed  BASIC METABOLIC PANEL - Abnormal; Notable for the following:    Sodium 129 (*)    Chloride 92 (*)    Glucose, Bld 113 (*)    Calcium 8.8 (*)    All other components within normal limits  CBC - Abnormal; Notable for the following:    WBC 17.6 (*)    RBC 3.55 (*)    Hemoglobin 10.6 (*)    HCT 30.4 (*)    Platelets 472 (*)    All other components within normal limits  BRAIN NATRIURETIC PEPTIDE - Abnormal; Notable for the following:    B Natriuretic Peptide 240.6 (*)    All other components within normal limits  I-STAT ARTERIAL BLOOD GAS, ED - Abnormal; Notable for the following:    pCO2 arterial 33.0 (*)    pO2, Arterial 77.0 (*)    All other components within normal limits  URINE CULTURE   RESPIRATORY VIRUS PANEL  CULTURE, BLOOD (ROUTINE X 2)  CULTURE, BLOOD (ROUTINE X 2)  CULTURE, EXPECTORATED SPUTUM-ASSESSMENT  GRAM STAIN  URINALYSIS, ROUTINE W REFLEX MICROSCOPIC (NOT AT [facility])  INFLUENZA PANEL BY PCR (TYPE A & B, H1N1)  LACTIC ACID, PLASMA  LACTIC ACID, PLASMA  PROCALCITONIN  PROTIME-INR  APTT  HIV ANTIBODY (ROUTINE TESTING)  STREP PNEUMONIAE URINARY ANTIGEN  LEGIONELLA ANTIGEN, URINE  I-STAT TROPOININ, ED    Imaging Review Dg Chest 2 View  04/30/2015  CLINICAL DATA:  Cough, shortness of breath for 1 month EXAM: CHEST  2 VIEW COMPARISON:  04/28/2015 FINDINGS: Cardiomediastinal silhouette is stable. Again noted diffuse bilateral reticular interstitial prominence highly suspicious for interstitial infiltrates or pneumonitis. Less likely pulmonary edema as there is no any central vascular congestion. Mild degenerative changes thoracic spine. IMPRESSION: Again noted diffuse bilateral reticular interstitial prominence highly suspicious for interstitial infiltrates or pneumonitis. No segmental consolidation. Less likely pulmonary  edema. Mild degenerative changes lower thoracic spine Electronically Signed   By: Lahoma Crocker M.D.   On: 04/30/2015 15:44   I have personally reviewed and evaluated these images and lab results as part of my medical decision-making.   EKG Interpretation   Date/Time:  Thursday April 30 2015 15:04:16 EST Ventricular Rate:  96 PR Interval:  156 QRS Duration: 78 QT Interval:  362 QTC Calculation: 457 R Axis:   -17 Text Interpretation:  Normal sinus rhythm Normal ECG No significant change  since last tracing Confirmed by Christy Gentles  MD, Letishia Elliott ([id number]) on 04/30/2015  7:11:50 PM     9:07 PM Pt with cough/sob/wheezing She was seen recently, felt improved and went home but now is worsening She has significant abnormal lung sounds She was hypoxic to 70s on arrival Will need admission PT [place] 10:29 PM  PT IMPROVED WITH NEB  TREATMENTS HYPOXIA IMPROVED WILL ADMIT D/W DR Hal Hope FOR ADMISSION ADMIT TO TELE   MDM   Final diagnoses:  Status asthmaticus, unspecified asthma severity  CAP (community acquired pneumonia)    Nursing notes including past medical history and social history reviewed and considered in documentation xrays/imaging reviewed by myself and considered during evaluation Labs/vital reviewed myself and considered during evaluation Previous records reviewed and considered     Ripley Fraise, MD 04/30/15 2233

## 2015-04-30 NOTE — H&P (Addendum)
[facility] History and Physical  Madeline Dickerson [id number] DOB: 04-05-1960 DOA: 04/30/2015  Referring physician: ED physician PCP: Elyn Peers, MD  Specialists:   Chief Complaint: Cough, shortness of breath  HPI: Madeline Dickerson is a 55 y.o. female with PMH of hypertension, GERD, depression, colitis, tobacco abuse, chronic back pain, arthritis, seasonal asthma, who presents with cough and shortness breath.  Patient reports that she has been having cough and shortness of breath for several weeks which has worsened in the past several days. She has subjective fever, no chills. She haa nasal congestion and wheezing. She coughs up light greenish sputum. She has runny nose, no sore throat. No chest pain. Patient has burning and mild dysuria on urination. Patient does not have abdominal pain, nausea, vomiting, diarrhea, unilateral weakness.  In ED, patient was found to have WBC 17.6, negative troponin, BNP 240.6, oxygen desaturation to 77% on room air, temperature normal, no tachycardia, no tachypnea, hyponatremia with sodium of 129, renal function okay. Chest x-ray showed diffuse bilateral reticular interstitial prominence highly suspicious for interstitial infiltrates or pneumonitis. No segmental consolidation. Less likely pulmonary edema. Mild degenerative changes lower thoracic spine.   EKG: Not done in ED, will get one.   Where does patient live?   At home   Can patient participate in ADLs?  Yes    Review of Systems:   General: has subjective fever fevers, no chills, no changes in body weight, has poor appetite, has fatigue HEENT: no blurry vision, hearing changes or sore throat Pulm: has dyspnea, coughing, wheezing CV: no chest pain, no palpitations Abd: no nausea, vomiting, abdominal pain, diarrhea, constipation GU: has dysuria, burning on urination, increased urinary frequency, no hematuria  Ext: no leg edema Neuro: no unilateral weakness, numbness, or tingling, no vision  change or hearing loss Skin: no rash MSK: No muscle spasm, no deformity, no limitation of range of movement in spin Heme: No easy bruising.  Travel history: No recent long distant travel.  Allergy:  Allergies  Allergen Reactions  . Haldol [Haloperidol Lactate] Other (See Comments)    Couldn't talk, couldn't move   . Naproxen Nausea And Vomiting    Past Medical History  Diagnosis Date  . Anxiety   . Hypertension   . Chronic low back pain 1991    disk s/p 4 diskectomies, 5th fursion, then spinal cord stimulator ([facility])  . Bronchitis     2 weeks ago rx regime  . Depression   . GERD (gastroesophageal reflux disease)   . Arthritis   . Rotator cuff disorder   . Seasonal asthma     Past Surgical History  Procedure Laterality Date  . Spine surgery  7/ 12 last    5 previous  . Stimulator  12    spine,removed 7/12  . Cesarean section  86  . Lumbar fusion  04/27/2011    Social History:  reports that she has been smoking Cigarettes.  She has a 30 pack-year smoking history. She has never used smokeless tobacco. She reports that she does not drink alcohol or use illicit drugs.  Family History:  Family History  Problem Relation Age of Onset  . Stroke Father   . Hypertension Father      Prior to Admission medications   Medication Sig Start Date End Date Taking? Authorizing Provider  FLUoxetine (PROZAC) 40 MG capsule Take 1 capsule (40 mg total) by mouth daily. 11/05/14  Yes Kaitlyn Szekalski, PA-C  ibuprofen (ADVIL,MOTRIN) 200 MG tablet Take 400 mg  by mouth every 4 (four) hours as needed for fever or moderate pain.   Yes Historical Provider, MD  LYRICA 200 MG capsule Take 200 mg by mouth 2 (two) times daily. 11/01/14  Yes Historical Provider, MD  omeprazole (PRILOSEC) 40 MG capsule Take 40 mg by mouth daily.   Yes Historical Provider, MD  predniSONE (DELTASONE) 10 MG tablet Take 1 tablet (10 mg total) by mouth once. Take q day 6,5,4,3,2,1 04/28/15  Yes Daleen Bo, MD   promethazine (PHENERGAN) 50 MG tablet Take 50 mg by mouth every 6 (six) hours as needed for nausea or vomiting.   Yes Historical Provider, MD  SUBOXONE 8-2 MG FILM Place 1 Film under the tongue 2 (two) times daily. 04/30/15  Yes Historical Provider, MD  valsartan-hydrochlorothiazide (DIOVAN-HCT) 160-12.5 MG per tablet Take 1 tablet by mouth daily. Reported on 04/30/2015   Yes Historical Provider, MD    Physical Exam: Filed Vitals:   04/30/15 2115 04/30/15 2129 04/30/15 2130 04/30/15 2156  BP: 119/68     Pulse: 76  78   Temp:      TempSrc:      Resp: 21  23   Weight:      SpO2: 92% 98% 96% 97%   General: Not in acute distress HEENT:       Eyes: PERRL, EOMI, no scleral icterus.       ENT: No discharge from the ears and nose, no pharynx injection, no tonsillar enlargement.        Neck: No JVD, no bruit, no mass felt. Heme: No neck lymph node enlargement. Cardiac: S1/S2, RRR, No murmurs, No gallops or rubs. Pulm: Diffuse rhonchi and wheezing bilaterally. No rales or rubs. Abd: Soft, nondistended, nontender, no rebound pain, no organomegaly, BS present. Ext: No pitting leg edema bilaterally. 2+DP/PT pulse bilaterally. Musculoskeletal: No joint deformities, No joint redness or warmth, no limitation of ROM in spin. Skin: No rashes.  Neuro: Alert, oriented X3, cranial nerves II-XII grossly intact, moves all extremities normally. Psych: Patient is not psychotic, no suicidal or hemocidal ideation.  Labs on Admission:  Basic Metabolic Panel:  Recent Labs Lab 04/30/15 1533  NA 129*  K 4.0  CL 92*  CO2 22  GLUCOSE 113*  BUN 8  CREATININE 0.80  CALCIUM 8.8*   Liver Function Tests: No results for input(s): AST, ALT, ALKPHOS, BILITOT, PROT, ALBUMIN in the last 168 hours. No results for input(s): LIPASE, AMYLASE in the last 168 hours. No results for input(s): AMMONIA in the last 168 hours. CBC:  Recent Labs Lab 04/30/15 1533  WBC 17.6*  HGB 10.6*  HCT 30.4*  MCV 85.6  PLT 472*    Cardiac Enzymes: No results for input(s): CKTOTAL, CKMB, CKMBINDEX, TROPONINI in the last 168 hours.  BNP (last 3 results)  Recent Labs  04/30/15 2007  BNP 240.6*    ProBNP (last 3 results) No results for input(s): PROBNP in the last 8760 hours.  CBG: No results for input(s): GLUCAP in the last 168 hours.  Radiological Exams on Admission: Dg Chest 2 View  04/30/2015  CLINICAL DATA:  Cough, shortness of breath for 1 month EXAM: CHEST  2 VIEW COMPARISON:  04/28/2015 FINDINGS: Cardiomediastinal silhouette is stable. Again noted diffuse bilateral reticular interstitial prominence highly suspicious for interstitial infiltrates or pneumonitis. Less likely pulmonary edema as there is no any central vascular congestion. Mild degenerative changes thoracic spine. IMPRESSION: Again noted diffuse bilateral reticular interstitial prominence highly suspicious for interstitial infiltrates or pneumonitis. No segmental consolidation.  Less likely pulmonary edema. Mild degenerative changes lower thoracic spine Electronically Signed   By: Lahoma Crocker M.D.   On: 04/30/2015 15:44    Assessment/Plan Principal Problem:   Acute respiratory failure with hypoxia (HCC) Active Problems:   Anxiety   Depression   Hypertension   Chronic low back pain   Tobacco abuse   CAP (community acquired pneumonia)   Pneumonitis   Asthma exacerbation   Hyponatremia  Acute respiratory failure with hypoxia: likely due to interstitial pneumonitis vs. CAP vs. asthma exacerbation. Her BNP is 240, but no leg edema or JVD, less likely to have CHF. No chest pain or signs of DVT, less likely to have pulmonary embolism. Patient has leukocytosis. She does not meet criteria for sepsis, but pending lactate level. If lactate level is elevated, she will meet criteria for sepsis. Currently hemodynamically stable.   - Will admit to Telemetry Bed - IV Rocephin and azithromycin - Mucinex for cough  - DuoNeb, albuterol Neb prn for SOB -  Urine legionella and S. pneumococcal antigen - Follow up blood culture x2, sputum culture and respiratory virus panel, plus Flu pcr - will get Procalcitonin and trend lactic acid level per sepsis protoco  Addendum: lactate comes back elevated at 2.6, now she meets criteria for sepsis. -IVF: NS 2.5 L and then 100 cc/h - will give another 1L of NS since lactate is trending up to 3.7  Tobacco abuse: -Did counseling about importance of quitting smoking -Nicotine patch  Depression and anxiety: Stable, no suicidal or homicidal ideations. -Continue home medications: Prozac, Suboxone  Hyponatremia: Sodium 129. Mental status is normal. Likely due to HCTZ use. -Switch Diovan-HCTZ to irbesartan -Follow-up by BMP  HTN; --Switch Diovan-HCTZ to irbesartan as above  GERD: -Protonix  Chronic low back pain: -has lumbar spinous stimulator -Continue ibuprofen and lyrica  Dysuria: -Follow-up urinalysis and urine culture   DVT ppx: SQ Lovenox  Code Status: Full code Family Communication: None at bed side.  Disposition Plan: Admit to inpatient   Date of Service 04/30/2015    Ivor Costa [facility] Pager [phone]  If 7PM-7AM, please contact night-coverage [email or web address] Password [email or web address] 04/30/2015, 10:02 PM

## 2015-04-30 NOTE — ED Provider Notes (Signed)
Pt admitted to [facility] dr Cathi Roan, MD 04/30/15 2234

## 2015-04-30 NOTE — Progress Notes (Signed)
MEDICATION RELATED CONSULT NOTE - INITIAL   Pharmacy Consult for Antibiotic renal dosing adjustments.   Allergies  Allergen Reactions  . Haldol [Haloperidol Lactate] Other (See Comments)    Couldn't talk, couldn't move   . Naproxen Nausea And Vomiting    Patient Measurements: Weight: 193 lb (87.544 kg)  Vital Signs: Temp: 97.6 F (36.4 C) (03/09 1929) Temp Source: Oral (03/09 1929) BP: 119/68 mmHg (03/09 2115) Pulse Rate: 78 (03/09 2130) Intake/Output from previous day:   Intake/Output from this shift:    Labs:  Recent Labs  04/30/15 1533  WBC 17.6*  HGB 10.6*  HCT 30.4*  PLT 472*  CREATININE 0.80   CrCl cannot be calculated (Unknown ideal weight.).   Microbiology: No results found for this or any previous visit (from the past 720 hour(s)).  Medical History: Past Medical History  Diagnosis Date  . Anxiety   . Hypertension   . Chronic low back pain 1991    disk s/p 4 diskectomies, 5th fursion, then spinal cord stimulator ([facility])  . Bronchitis     2 weeks ago rx regime  . Depression   . GERD (gastroesophageal reflux disease)   . Arthritis   . Rotator cuff disorder   . Seasonal asthma     Assessment: 2 yom admitted 04/30/2015  with cough and SOB. Pharmacy consulted for renal antibiotic dosing adjustments. Pt currently receiving day #1 azithromycin and ceftriaxone for CAP.   Plan:  Continue azithromycin 500mg  IV Q24h  Continue ceftriaxone 1g IV Q24h  [name] C. Lennox Grumbles, PharmD Pharmacy Resident  Pager: [phone] 04/30/2015 10:12 PM

## 2015-04-30 NOTE — ED Notes (Signed)
Pt reports SOB, and congestion that started a couple of days ago. States that she was at PCP office today and told to come here for further evaluation

## 2015-05-01 ENCOUNTER — Encounter (HOSPITAL_COMMUNITY): Payer: Self-pay | Admitting: General Practice

## 2015-05-01 DIAGNOSIS — B9620 Unspecified Escherichia coli [E. coli] as the cause of diseases classified elsewhere: Secondary | ICD-10-CM | POA: Diagnosis present

## 2015-05-01 DIAGNOSIS — D649 Anemia, unspecified: Secondary | ICD-10-CM | POA: Diagnosis present

## 2015-05-01 DIAGNOSIS — M797 Fibromyalgia: Secondary | ICD-10-CM | POA: Diagnosis present

## 2015-05-01 DIAGNOSIS — M1990 Unspecified osteoarthritis, unspecified site: Secondary | ICD-10-CM | POA: Diagnosis present

## 2015-05-01 DIAGNOSIS — K529 Noninfective gastroenteritis and colitis, unspecified: Secondary | ICD-10-CM | POA: Diagnosis present

## 2015-05-01 DIAGNOSIS — I10 Essential (primary) hypertension: Secondary | ICD-10-CM | POA: Diagnosis not present

## 2015-05-01 DIAGNOSIS — J9601 Acute respiratory failure with hypoxia: Secondary | ICD-10-CM | POA: Diagnosis not present

## 2015-05-01 DIAGNOSIS — T502X5A Adverse effect of carbonic-anhydrase inhibitors, benzothiadiazides and other diuretics, initial encounter: Secondary | ICD-10-CM | POA: Diagnosis present

## 2015-05-01 DIAGNOSIS — E861 Hypovolemia: Secondary | ICD-10-CM | POA: Diagnosis present

## 2015-05-01 DIAGNOSIS — F419 Anxiety disorder, unspecified: Secondary | ICD-10-CM | POA: Diagnosis present

## 2015-05-01 DIAGNOSIS — E876 Hypokalemia: Secondary | ICD-10-CM | POA: Diagnosis present

## 2015-05-01 DIAGNOSIS — M545 Low back pain: Secondary | ICD-10-CM

## 2015-05-01 DIAGNOSIS — R300 Dysuria: Secondary | ICD-10-CM | POA: Diagnosis present

## 2015-05-01 DIAGNOSIS — F329 Major depressive disorder, single episode, unspecified: Secondary | ICD-10-CM | POA: Diagnosis not present

## 2015-05-01 DIAGNOSIS — G8929 Other chronic pain: Secondary | ICD-10-CM | POA: Diagnosis present

## 2015-05-01 DIAGNOSIS — J189 Pneumonia, unspecified organism: Secondary | ICD-10-CM

## 2015-05-01 DIAGNOSIS — A419 Sepsis, unspecified organism: Principal | ICD-10-CM

## 2015-05-01 DIAGNOSIS — R05 Cough: Secondary | ICD-10-CM | POA: Diagnosis not present

## 2015-05-01 DIAGNOSIS — E538 Deficiency of other specified B group vitamins: Secondary | ICD-10-CM | POA: Diagnosis not present

## 2015-05-01 DIAGNOSIS — E871 Hypo-osmolality and hyponatremia: Secondary | ICD-10-CM | POA: Diagnosis present

## 2015-05-01 DIAGNOSIS — J45901 Unspecified asthma with (acute) exacerbation: Secondary | ICD-10-CM | POA: Diagnosis not present

## 2015-05-01 DIAGNOSIS — F17210 Nicotine dependence, cigarettes, uncomplicated: Secondary | ICD-10-CM | POA: Diagnosis present

## 2015-05-01 DIAGNOSIS — J45902 Unspecified asthma with status asthmaticus: Secondary | ICD-10-CM | POA: Diagnosis present

## 2015-05-01 DIAGNOSIS — K219 Gastro-esophageal reflux disease without esophagitis: Secondary | ICD-10-CM | POA: Diagnosis present

## 2015-05-01 HISTORY — DX: Pneumonia, unspecified organism: J18.9

## 2015-05-01 LAB — CBC
HCT: 27.8 % — ABNORMAL LOW (ref 36.0–46.0)
Hemoglobin: 9 g/dL — ABNORMAL LOW (ref 12.0–15.0)
MCH: 28 pg (ref 26.0–34.0)
MCHC: 32.4 g/dL (ref 30.0–36.0)
MCV: 86.6 fL (ref 78.0–100.0)
Platelets: 400 10*3/uL (ref 150–400)
RBC: 3.21 MIL/uL — ABNORMAL LOW (ref 3.87–5.11)
RDW: 15.7 % — ABNORMAL HIGH (ref 11.5–15.5)
WBC: 11.9 10*3/uL — ABNORMAL HIGH (ref 4.0–10.5)

## 2015-05-01 LAB — INFLUENZA PANEL BY PCR (TYPE A & B)
H1N1 flu by pcr: NOT DETECTED
Influenza A By PCR: NEGATIVE
Influenza B By PCR: NEGATIVE

## 2015-05-01 LAB — BASIC METABOLIC PANEL
Anion gap: 15 (ref 5–15)
BUN: 10 mg/dL (ref 6–20)
CO2: 17 mmol/L — ABNORMAL LOW (ref 22–32)
Calcium: 8 mg/dL — ABNORMAL LOW (ref 8.9–10.3)
Chloride: 96 mmol/L — ABNORMAL LOW (ref 101–111)
Creatinine, Ser: 0.76 mg/dL (ref 0.44–1.00)
GFR calc Af Amer: >60 mL/min (ref >60)
GFR calc non Af Amer: >60 mL/min (ref >60)
Glucose, Bld: 160 mg/dL — ABNORMAL HIGH (ref 65–99)
Potassium: 3.4 mmol/L — ABNORMAL LOW (ref 3.5–5.1)
Sodium: 128 mmol/L — ABNORMAL LOW (ref 135–145)

## 2015-05-01 LAB — LEGIONELLA ANTIGEN, URINE

## 2015-05-01 LAB — LACTIC ACID, PLASMA
Lactic Acid, Venous: 2.6 mmol/L (ref 0.5–2.0)
Lactic Acid, Venous: 3.7 mmol/L (ref 0.5–2.0)
Lactic Acid, Venous: 2.8 mmol/L (ref 0.5–2.0)
Lactic Acid, Venous: 2.8 mmol/L (ref 0.5–2.0)
Lactic Acid, Venous: 1.8 mmol/L (ref 0.5–2.0)

## 2015-05-01 LAB — HIV ANTIBODY (ROUTINE TESTING W REFLEX): HIV Screen 4th Generation wRfx: NONREACTIVE

## 2015-05-01 LAB — PROCALCITONIN: Procalcitonin: <0.1 ng/mL

## 2015-05-01 LAB — APTT: aPTT: 29 seconds (ref 24–37)

## 2015-05-01 LAB — STREP PNEUMONIAE URINARY ANTIGEN: Strep Pneumo Urinary Antigen: NEGATIVE

## 2015-05-01 MED ORDER — SODIUM CHLORIDE 0.9 % IV SOLN
INTRAVENOUS | Status: DC
  Administered 2015-05-01 – 2015-05-02 (×2): via INTRAVENOUS

## 2015-05-01 MED ORDER — LORAZEPAM 0.5 MG PO TABS
0.5000 mg | ORAL_TABLET | Freq: Three times a day (TID) | ORAL | Status: DC | PRN
Start: 2015-05-01 — End: 2015-05-04
  Administered 2015-05-01 – 2015-05-04 (×7): 0.5 mg via ORAL
  Filled 2015-05-01 (×8): qty 1

## 2015-05-01 MED ORDER — SODIUM CHLORIDE 0.9 % IV BOLUS (SEPSIS)
500.0000 mL | Freq: Once | INTRAVENOUS | Status: AC
  Administered 2015-05-01: 500 mL via INTRAVENOUS

## 2015-05-01 MED ORDER — SODIUM CHLORIDE 0.9 % IV BOLUS (SEPSIS)
1000.0000 mL | Freq: Once | INTRAVENOUS | Status: AC
  Administered 2015-05-01: 1000 mL via INTRAVENOUS

## 2015-05-01 MED ORDER — IPRATROPIUM-ALBUTEROL 0.5-2.5 (3) MG/3ML IN SOLN
3.0000 mL | Freq: Three times a day (TID) | RESPIRATORY_TRACT | Status: DC
Start: 2015-05-01 — End: 2015-05-04
  Administered 2015-05-01 – 2015-05-04 (×9): 3 mL via RESPIRATORY_TRACT
  Filled 2015-05-01 (×9): qty 3

## 2015-05-01 MED ORDER — POTASSIUM CHLORIDE CRYS ER 20 MEQ PO TBCR
40.0000 meq | EXTENDED_RELEASE_TABLET | Freq: Once | ORAL | Status: AC
  Administered 2015-05-01: 20 meq via ORAL
  Filled 2015-05-01: qty 2

## 2015-05-01 MED ORDER — SODIUM CHLORIDE 0.9 % IV BOLUS (SEPSIS)
2500.0000 mL | Freq: Once | INTRAVENOUS | Status: AC
  Administered 2015-05-01: 2500 mL via INTRAVENOUS

## 2015-05-01 NOTE — ED Notes (Signed)
[facility] tray ordered @ [phone].

## 2015-05-01 NOTE — ED Notes (Addendum)
Critical lab Value  Lactic Acid 2.8   Dr. Maryland Pink paged.

## 2015-05-01 NOTE — ED Notes (Signed)
Critical Lab Value:  Lactic 2.8  Dr. Maryland Pink paged.

## 2015-05-01 NOTE — ED Notes (Signed)
Notified the admitting - Dr. Maryland Pink of pt's lactic acid.

## 2015-05-01 NOTE — ED Notes (Signed)
Pt transferred to bedside commode less than a meter from bedside on 4L. Upon returning to bed pt's O2 86%, RR 34.  Within 3 minutes O2 98%, RR 22.

## 2015-05-01 NOTE — ED Notes (Addendum)
Ambulated pt to restroom with 4L O2. Pt became SOB on the way to the restroom and was unable to make the full walk back to room. Pts lips became cyanotic and face became ashen. Put pt in wheelchair and upon returning to room O2 sats were 87%. Increased O2 to 6L and sats returned to upper 90s with 1 minute.

## 2015-05-01 NOTE — Progress Notes (Signed)
[facility] PROGRESS NOTE  Madeline Dickerson [id number] DOB: 10-Jan-1961 DOA: 04/30/2015  PCP: Elyn Peers, MD  Brief HPI: 55 year old Caucasian female with past medical history of hypertension, GERD, depression, chronic back pain, presented with cough and shortness of breath. Patient was found to have an elevated WBC. She was noted to be hypoxic. Chest x-ray was concerning for interstitial infiltrates. Patient was also noted to have elevated lactic acid level. She was hospitalized for further management.  Past medical history:  Past Medical History  Diagnosis Date  . Anxiety   . Hypertension   . Chronic low back pain 1991    disk s/p 4 diskectomies, 5th fursion, then spinal cord stimulator ([facility])  . Bronchitis     2 weeks ago rx regime  . Depression   . GERD (gastroesophageal reflux disease)   . Arthritis   . Rotator cuff disorder   . Seasonal asthma     Consultants: None  Procedures: None  Antibiotics: Ceftriaxone and azithromycin  Subjective: Patient feels better this morning. Not as short of breath. She was yesterday. However, she is getting quite dyspneic with minimal exertion. Denies any chest pain. No nausea or vomiting currently.  Objective: Vital Signs  Filed Vitals:   05/01/15 1000 05/01/15 1030 05/01/15 1222 05/01/15 1223  BP:   126/72 126/72  Pulse: 89 87 78 77  Temp:      TempSrc:      Resp: 15 15 21 19   Weight:      SpO2: 95% 97% 97% 98%   No intake or output data in the 24 hours ending 05/01/15 1318 Filed Weights   04/30/15 1504  Weight: 87.544 kg (193 lb)    General appearance: alert, cooperative, appears stated age and no distress Resp: Patient is noted to have wheezing bilaterally with scattered crackles. No rhonchi. She is noted to be tachypneic. Cardio: regular rate and rhythm, S1, S2 normal, no murmur, click, rub or gallop GI: soft, non-tender; bowel sounds normal; no masses,  no organomegaly Extremities: extremities normal,  atraumatic, no cyanosis or edema Neurologic: Awake and alert. Oriented 3. Slightly tremulous. No focal neurological deficits.  Lab Results:  Basic Metabolic Panel:  Recent Labs Lab 04/30/15 1533 05/01/15 0830  NA 129* 128*  K 4.0 3.4*  CL 92* 96*  CO2 22 17*  GLUCOSE 113* 160*  BUN 8 10  CREATININE 0.80 0.76  CALCIUM 8.8* 8.0*   CBC:  Recent Labs Lab 04/30/15 1533 05/01/15 0830  WBC 17.6* 11.9*  HGB 10.6* 9.0*  HCT 30.4* 27.8*  MCV 85.6 86.6  PLT 472* 400   BNP (last 3 results)  Recent Labs  04/30/15 2007  BNP 240.6*     Recent Results (from the past 240 hour(s))  Urine culture     Status: None (Preliminary result)   Collection Time: 04/30/15 10:06 PM  Result Value Ref Range Status   Specimen Description URINE, RANDOM  Final   Special Requests NONE  Final   Culture TOO YOUNG TO READ  Final   Report Status PENDING  Incomplete      Studies/Results: Dg Chest 2 View  04/30/2015  CLINICAL DATA:  Cough, shortness of breath for 1 month EXAM: CHEST  2 VIEW COMPARISON:  04/28/2015 FINDINGS: Cardiomediastinal silhouette is stable. Again noted diffuse bilateral reticular interstitial prominence highly suspicious for interstitial infiltrates or pneumonitis. Less likely pulmonary edema as there is no any central vascular congestion. Mild degenerative changes thoracic spine. IMPRESSION: Again noted diffuse bilateral reticular interstitial  prominence highly suspicious for interstitial infiltrates or pneumonitis. No segmental consolidation. Less likely pulmonary edema. Mild degenerative changes lower thoracic spine Electronically Signed   By: Lahoma Crocker M.D.   On: 04/30/2015 15:44    Medications:  Scheduled: . Buprenorphine HCl-Naloxone HCl  1 Film Sublingual BID  . dextromethorphan-guaiFENesin  1 tablet Oral BID  . enoxaparin (LOVENOX) injection  40 mg Subcutaneous Q24H  . FLUoxetine  40 mg Oral Daily  . ipratropium-albuterol  3 mL Nebulization Q3H  . irbesartan  150  mg Oral Daily  . methylPREDNISolone (SOLU-MEDROL) injection  60 mg Intravenous TID  . nicotine  21 mg Transdermal Daily  . pantoprazole  40 mg Oral Daily  . pregabalin  200 mg Oral BID   Continuous: . sodium chloride 100 mL/hr at 05/01/15 0524  . azithromycin Stopped (05/01/15 0034)  . cefTRIAXone (ROCEPHIN)  IV     [id number], ibuprofen, LORazepam, promethazine  Assessment/Plan:  Principal Problem:   Acute respiratory failure with hypoxia (HCC) Active Problems:   Anxiety   Depression   Hypertension   Chronic low back pain   Tobacco abuse   CAP (community acquired pneumonia)   Pneumonitis   Asthma exacerbation   Hyponatremia    Acute respiratory failure with hypoxia Likely due to interstitial pneumonitis vs. CAP. Her BNP is 240, but no leg edema or JVD, and so less likely to have CHF. Continue antibiotics. Continue oxygen. ABG reviewed. Influenza PCR is negative.  Community acquired pneumonia with sepsis Patient's lactic acid level was noted to be elevated. Patient was aggressively hydrated. Lactic acid level has improved. She has received almost 4-1/2 L of normal saline. Continue IV infusion for now. Patient symptomatically feels better. Continue to monitor closely. Strep antigen negative. Pro-calcitonin less than 0.1. Follow-up blood cultures.  Hyponatremia and hypokalemia Likely due to hypovolemia as well as use of diuretics. Continue to monitor daily. Replete potassium.  Essential hypertension  Continue home medications. Blood pressure is stable.   Normocytic anemia Drop in hemoglobin is likely dilutional. No overt bleeding identified. Continue to monitor.  Tobacco abuse: Nicotine patch  Depression and anxiety Continue home medications: Prozac, Suboxone  Chronic low back pain Has lumbar spinous stimulator. Continue ibuprofen and lyrica.  Dysuria UA does not suggest UTI. Continue to monitor for now.  GERD: Protonix   DVT Prophylaxis: Lovenox    Code  Status: Full code  Family Communication: Discussed with the patient  Disposition Plan: Continue management as outlined above. Discharge in 1-2 days.    LOS: 1 day   [facility] Pager [phone] 05/01/2015, 1:18 PM  If 7PM-7AM, please contact night-coverage at [email or web address], password [facility]

## 2015-05-02 DIAGNOSIS — I10 Essential (primary) hypertension: Secondary | ICD-10-CM

## 2015-05-02 LAB — BASIC METABOLIC PANEL
Anion gap: 12 (ref 5–15)
BUN: 14 mg/dL (ref 6–20)
CO2: 19 mmol/L — ABNORMAL LOW (ref 22–32)
Calcium: 8.5 mg/dL — ABNORMAL LOW (ref 8.9–10.3)
Chloride: 105 mmol/L (ref 101–111)
Creatinine, Ser: 0.84 mg/dL (ref 0.44–1.00)
GFR calc Af Amer: >60 mL/min (ref >60)
GFR calc non Af Amer: >60 mL/min (ref >60)
Glucose, Bld: 124 mg/dL — ABNORMAL HIGH (ref 65–99)
Potassium: 4.7 mmol/L (ref 3.5–5.1)
Sodium: 136 mmol/L (ref 135–145)

## 2015-05-02 LAB — VITAMIN B12: Vitamin B-12: 183 pg/mL (ref 180–914)

## 2015-05-02 LAB — CBC
HCT: 30.1 % — ABNORMAL LOW (ref 36.0–46.0)
Hemoglobin: 9.7 g/dL — ABNORMAL LOW (ref 12.0–15.0)
MCH: 28.5 pg (ref 26.0–34.0)
MCHC: 32.2 g/dL (ref 30.0–36.0)
MCV: 88.5 fL (ref 78.0–100.0)
Platelets: 418 10*3/uL — ABNORMAL HIGH (ref 150–400)
RBC: 3.4 MIL/uL — ABNORMAL LOW (ref 3.87–5.11)
RDW: 16.1 % — ABNORMAL HIGH (ref 11.5–15.5)
WBC: 12.7 10*3/uL — ABNORMAL HIGH (ref 4.0–10.5)

## 2015-05-02 LAB — FOLATE: Folate: 3.4 ng/mL — ABNORMAL LOW (ref >5.9)

## 2015-05-02 LAB — IRON AND TIBC
Iron: 58 ug/dL (ref 28–170)
Saturation Ratios: 23 % (ref 10.4–31.8)
TIBC: 248 ug/dL — ABNORMAL LOW (ref 250–450)
UIBC: 190 ug/dL

## 2015-05-02 LAB — RETICULOCYTES
RBC.: 3.4 MIL/uL — ABNORMAL LOW (ref 3.87–5.11)
Retic Count, Absolute: 54.4 10*3/uL (ref 19.0–186.0)
Retic Ct Pct: 1.6 % (ref 0.4–3.1)

## 2015-05-02 LAB — FERRITIN: Ferritin: 97 ng/mL (ref 11–307)

## 2015-05-02 MED ORDER — ADULT MULTIVITAMIN W/MINERALS CH
1.0000 | ORAL_TABLET | Freq: Every day | ORAL | Status: DC
  Administered 2015-05-02 – 2015-05-04 (×3): 1 via ORAL
  Filled 2015-05-02 (×3): qty 1

## 2015-05-02 MED ORDER — FOLIC ACID 1 MG PO TABS
1.0000 mg | ORAL_TABLET | Freq: Every day | ORAL | Status: DC
  Administered 2015-05-02 – 2015-05-04 (×3): 1 mg via ORAL
  Filled 2015-05-02 (×3): qty 1

## 2015-05-02 MED ORDER — VITAMIN B-12 1000 MCG PO TABS
1000.0000 ug | ORAL_TABLET | Freq: Every day | ORAL | Status: DC
  Administered 2015-05-02 – 2015-05-04 (×3): 1000 ug via ORAL
  Filled 2015-05-02 (×3): qty 1

## 2015-05-02 NOTE — Progress Notes (Addendum)
[facility] PROGRESS NOTE  Madeline Dickerson [id number] DOB: 1960/10/31 DOA: 04/30/2015  PCP: Elyn Peers, MD  Brief HPI: 55 year old Caucasian female with past medical history of hypertension, GERD, depression, chronic back pain, presented with cough and shortness of breath. Patient was found to have an elevated WBC. She was noted to be hypoxic. Chest x-ray was concerning for interstitial infiltrates. Patient was also noted to have elevated lactic acid level. She was hospitalized for further management.  Past medical history:  Past Medical History  Diagnosis Date  . Hypertension   . Depression   . GERD (gastroesophageal reflux disease)   . Rotator cuff disorder   . Seasonal asthma   . Walking pneumonia   . Pneumonia 05/01/2015  . Arthritis     "lower back, hands" (05/01/2015)  . Fibromyalgia   . Chronic low back pain     disk s/p 4 diskectomies, 5th fursion, then spinal cord stimulator ([place])  . Anxiety     hx    Consultants: None  Procedures: None  Antibiotics: Ceftriaxone and azithromycin  Subjective: Patient feels much better this morning. Still has some shortness of breath mainly with exertion. Continues to have a cough without any expectoration. No chest pain. No nausea or vomiting.   Objective: Vital Signs  Filed Vitals:   05/01/15 1459 05/01/15 2103 05/01/15 2240 05/02/15 0500  BP:   130/70 149/77  Pulse:   80 76  Temp:   98 F (36.7 C) 97.8 F (36.6 C)  TempSrc:   Oral Oral  Resp:   18 18  Height:      Weight:    100.3 kg (221 lb 1.9 oz)  SpO2: 97% 91% 97% 95%    Intake/Output Summary (Last 24 hours) at 05/02/15 0723 Last data filed at 05/01/15 2242  Gross per 24 hour  Intake    720 ml  Output   2400 ml  Net  -1680 ml   Filed Weights   04/30/15 1504 05/02/15 0500  Weight: 87.544 kg (193 lb) 100.3 kg (221 lb 1.9 oz)    General appearance: alert, cooperative, appears stated age and no distress Resp: Improved breath sounds  bilaterally. Less wheezing compared to yesterday. Scattered crackles. No rhonchi.  Cardio: regular rate and rhythm, S1, S2 normal, no murmur, click, rub or gallop GI: soft, non-tender; bowel sounds normal; no masses,  no organomegaly Extremities: extremities normal, atraumatic, no cyanosis or edema Neurologic: Awake and alert. Oriented 3. Slightly tremulous. No focal neurological deficits.  Lab Results:  Basic Metabolic Panel:  Recent Labs Lab 04/30/15 1533 05/01/15 0830 05/02/15 0331  NA 129* 128* 136  K 4.0 3.4* 4.7  CL 92* 96* 105  CO2 22 17* 19*  GLUCOSE 113* 160* 124*  BUN 8 10 14   CREATININE 0.80 0.76 0.84  CALCIUM 8.8* 8.0* 8.5*   CBC:  Recent Labs Lab 04/30/15 1533 05/01/15 0830 05/02/15 0331  WBC 17.6* 11.9* 12.7*  HGB 10.6* 9.0* 9.7*  HCT 30.4* 27.8* 30.1*  MCV 85.6 86.6 88.5  PLT 472* 400 418*   BNP (last 3 results)  Recent Labs  04/30/15 2007  BNP 240.6*     Recent Results (from the past 240 hour(s))  Urine culture     Status: None (Preliminary result)   Collection Time: 04/30/15 10:06 PM  Result Value Ref Range Status   Specimen Description URINE, RANDOM  Final   Special Requests NONE  Final   Culture TOO YOUNG TO READ  Final   Report  Status PENDING  Incomplete      Studies/Results: Dg Chest 2 View  04/30/2015  CLINICAL DATA:  Cough, shortness of breath for 1 month EXAM: CHEST  2 VIEW COMPARISON:  04/28/2015 FINDINGS: Cardiomediastinal silhouette is stable. Again noted diffuse bilateral reticular interstitial prominence highly suspicious for interstitial infiltrates or pneumonitis. Less likely pulmonary edema as there is no any central vascular congestion. Mild degenerative changes thoracic spine. IMPRESSION: Again noted diffuse bilateral reticular interstitial prominence highly suspicious for interstitial infiltrates or pneumonitis. No segmental consolidation. Less likely pulmonary edema. Mild degenerative changes lower thoracic spine  Electronically Signed   By: Lahoma Crocker M.D.   On: 04/30/2015 15:44    Medications:  Scheduled: . azithromycin  500 mg Intravenous Q24H  . Buprenorphine HCl-Naloxone HCl  1 Film Sublingual BID  . cefTRIAXone (ROCEPHIN)  IV  1 g Intravenous Q24H  . dextromethorphan-guaiFENesin  1 tablet Oral BID  . enoxaparin (LOVENOX) injection  40 mg Subcutaneous Q24H  . FLUoxetine  40 mg Oral Daily  . folic acid  1 mg Oral Daily  . ipratropium-albuterol  3 mL Nebulization TID  . irbesartan  150 mg Oral Daily  . methylPREDNISolone (SOLU-MEDROL) injection  60 mg Intravenous TID  . multivitamin with minerals  1 tablet Oral Daily  . nicotine  21 mg Transdermal Daily  . pantoprazole  40 mg Oral Daily  . pregabalin  200 mg Oral BID  . vitamin B-12  1,000 mcg Oral Daily   Continuous: . sodium chloride 100 mL/hr at 05/01/15 0524   [id number], ibuprofen, LORazepam, promethazine  Assessment/Plan:  Principal Problem:   Acute respiratory failure with hypoxia (HCC) Active Problems:   Anxiety   Depression   Hypertension   Chronic low back pain   Tobacco abuse   CAP (community acquired pneumonia)   Pneumonitis   Asthma exacerbation   Hyponatremia    Acute respiratory failure with hypoxia Patient is much improved. Continue to wean off of oxygen. Likely due to interstitial pneumonitis vs. CAP. Her BNP is 240, but no leg edema or JVD, and so less likely to have CHF. Continue antibiotics. Continue oxygen. ABG reviewed. Influenza PCR is negative.  Community acquired pneumonia with sepsis Patient's lactic acid level was noted to be elevated. Patient was aggressively hydrated. Lactic acid level has improved. She has received almost 4-1/2 L of normal saline. Continue IV infusion for now, but slow down the rate. Patient continues to improve. Continue to monitor closely. Strep antigen negative. Pro-calcitonin less than 0.1. Follow-up blood cultures. Continue IV antibiotics for today. Anticipate change to  oral antibiotics tomorrow.  Hyponatremia and hypokalemia Likely due to hypovolemia as well as use of diuretics. Sodium levels have significantly improved. Potassium is normal. Continue to monitor.   Essential hypertension  Continue home medications. Blood pressure is stable.   Normocytic anemia/vitamin 0000000 and folic acid deficiency Drop in hemoglobin is likely dilutional. No overt bleeding identified. Hemoglobin is stable this morning. Supplement with vitamin 0000000 and folic acid. Continue to monitor.  Tobacco abuse Nicotine patch  Depression and anxiety Continue home medications: Prozac, Suboxone  Chronic low back pain Has lumbar spinous stimulator. Continue ibuprofen and lyrica.  Dysuria UA does not suggest UTI. Continue to monitor for now.  GERD: Protonix   DVT Prophylaxis: Lovenox    Code Status: Full code  Family Communication: Discussed with the patient  Disposition Plan: Continue management as outlined above. Anticipate discharge tomorrow. Mobilize. Try to wean off of oxygen.     LOS: 2  days   [facility] Pager [phone] 05/02/2015, 7:23 AM  If 7PM-7AM, please contact night-coverage at [email or web address], password [facility]

## 2015-05-03 DIAGNOSIS — E538 Deficiency of other specified B group vitamins: Secondary | ICD-10-CM

## 2015-05-03 DIAGNOSIS — J45901 Unspecified asthma with (acute) exacerbation: Secondary | ICD-10-CM

## 2015-05-03 DIAGNOSIS — F329 Major depressive disorder, single episode, unspecified: Secondary | ICD-10-CM

## 2015-05-03 LAB — CBC
HCT: 29.7 % — ABNORMAL LOW (ref 36.0–46.0)
Hemoglobin: 9.4 g/dL — ABNORMAL LOW (ref 12.0–15.0)
MCH: 28.3 pg (ref 26.0–34.0)
MCHC: 31.6 g/dL (ref 30.0–36.0)
MCV: 89.5 fL (ref 78.0–100.0)
Platelets: 367 10*3/uL (ref 150–400)
RBC: 3.32 MIL/uL — ABNORMAL LOW (ref 3.87–5.11)
RDW: 16.3 % — ABNORMAL HIGH (ref 11.5–15.5)
WBC: 15 10*3/uL — ABNORMAL HIGH (ref 4.0–10.5)

## 2015-05-03 LAB — BASIC METABOLIC PANEL
Anion gap: 12 (ref 5–15)
BUN: 15 mg/dL (ref 6–20)
CO2: 20 mmol/L — ABNORMAL LOW (ref 22–32)
Calcium: 8.7 mg/dL — ABNORMAL LOW (ref 8.9–10.3)
Chloride: 102 mmol/L (ref 101–111)
Creatinine, Ser: 0.72 mg/dL (ref 0.44–1.00)
GFR calc Af Amer: >60 mL/min (ref >60)
GFR calc non Af Amer: >60 mL/min (ref >60)
Glucose, Bld: 125 mg/dL — ABNORMAL HIGH (ref 65–99)
Potassium: 4.9 mmol/L (ref 3.5–5.1)
Sodium: 134 mmol/L — ABNORMAL LOW (ref 135–145)

## 2015-05-03 LAB — URINE CULTURE: Culture: 80000

## 2015-05-03 MED ORDER — PREDNISONE 20 MG PO TABS
60.0000 mg | ORAL_TABLET | Freq: Every day | ORAL | Status: DC
  Administered 2015-05-04: 60 mg via ORAL
  Filled 2015-05-03: qty 3

## 2015-05-03 MED ORDER — AZITHROMYCIN 500 MG PO TABS
500.0000 mg | ORAL_TABLET | Freq: Every day | ORAL | Status: DC
  Administered 2015-05-04: 500 mg via ORAL
  Filled 2015-05-03 (×3): qty 1

## 2015-05-03 MED ORDER — CEFPODOXIME PROXETIL 200 MG PO TABS
200.0000 mg | ORAL_TABLET | Freq: Two times a day (BID) | ORAL | Status: DC
  Administered 2015-05-03 – 2015-05-04 (×2): 200 mg via ORAL
  Filled 2015-05-03 (×5): qty 1

## 2015-05-03 NOTE — Progress Notes (Signed)
[facility] PROGRESS NOTE  Madeline CAPOCCIA [id number] DOB: Jun 26, 1960 DOA: 04/30/2015  PCP: Elyn Peers, MD  Brief HPI: 55 year old Caucasian female with past medical history of hypertension, GERD, depression, chronic back pain, presented with cough and shortness of breath. Patient was found to have an elevated WBC. She was noted to be hypoxic. Chest x-ray was concerning for interstitial infiltrates. Patient was also noted to have elevated lactic acid level. She was hospitalized for further management.  Past medical history:  Past Medical History  Diagnosis Date  . Hypertension   . Depression   . GERD (gastroesophageal reflux disease)   . Rotator cuff disorder   . Seasonal asthma   . Walking pneumonia   . Pneumonia 05/01/2015  . Arthritis     "lower back, hands" (05/01/2015)  . Fibromyalgia   . Chronic low back pain     disk s/p 4 diskectomies, 5th fursion, then spinal cord stimulator ([place])  . Anxiety     hx    Consultants: None  Procedures: None  Antibiotics: Ceftriaxone till 3/12 azithromycin  Vantin 3/12  Subjective: Patient continues to feel better. Still getting short of breath with exertion. Denies any chest pain, nausea or vomiting.    Objective: Vital Signs  Filed Vitals:   05/02/15 0737 05/02/15 1423 05/02/15 2018 05/03/15 0520  BP:   152/84 155/93  Pulse:   69 67  Temp:   97.6 F (36.4 C) 98.1 F (36.7 C)  TempSrc:   Oral Oral  Resp:   18 18  Height:      Weight:      SpO2: 98% 95% 96% 98%    Intake/Output Summary (Last 24 hours) at 05/03/15 1111 Last data filed at 05/03/15 0734  Gross per 24 hour  Intake      0 ml  Output   2000 ml  Net  -2000 ml   Filed Weights   04/30/15 1504 05/02/15 0500  Weight: 87.544 kg (193 lb) 100.3 kg (221 lb 1.9 oz)    General appearance: alert, cooperative, appears stated age and no distress Resp: Much improved air entry compared to before. No wheezing appreciated today. No crackles. No rhonchi.   Cardio: regular rate and rhythm, S1, S2 normal, no murmur, click, rub or gallop GI: soft, non-tender; bowel sounds normal; no masses,  no organomegaly Extremities: extremities normal, atraumatic, no cyanosis or edema Neurologic: Awake and alert. Oriented 3. Slightly tremulous. No focal neurological deficits.  Lab Results:  Basic Metabolic Panel:  Recent Labs Lab 04/30/15 1533 05/01/15 0830 05/02/15 0331 05/03/15 0158  NA 129* 128* 136 134*  K 4.0 3.4* 4.7 4.9  CL 92* 96* 105 102  CO2 22 17* 19* 20*  GLUCOSE 113* 160* 124* 125*  BUN 8 10 14 15   CREATININE 0.80 0.76 0.84 0.72  CALCIUM 8.8* 8.0* 8.5* 8.7*   CBC:  Recent Labs Lab 04/30/15 1533 05/01/15 0830 05/02/15 0331 05/03/15 0158  WBC 17.6* 11.9* 12.7* 15.0*  HGB 10.6* 9.0* 9.7* 9.4*  HCT 30.4* 27.8* 30.1* 29.7*  MCV 85.6 86.6 88.5 89.5  PLT 472* 400 418* 367   BNP (last 3 results)  Recent Labs  04/30/15 2007  BNP 240.6*     Recent Results (from the past 240 hour(s))  Urine culture     Status: None   Collection Time: 04/30/15 10:06 PM  Result Value Ref Range Status   Specimen Description URINE, RANDOM  Final   Special Requests NONE  Final   Culture 80,000  COLONIES/ml ESCHERICHIA COLI  Final   Report Status 05/03/2015 FINAL  Final   Organism ID, Bacteria ESCHERICHIA COLI  Final      Susceptibility   Escherichia coli - MIC*    AMPICILLIN <=2 SENSITIVE Sensitive     CEFAZOLIN <=4 SENSITIVE Sensitive     CEFTRIAXONE <=1 SENSITIVE Sensitive     CIPROFLOXACIN <=0.25 SENSITIVE Sensitive     GENTAMICIN <=1 SENSITIVE Sensitive     IMIPENEM <=0.25 SENSITIVE Sensitive     NITROFURANTOIN <=16 SENSITIVE Sensitive     TRIMETH/SULFA <=20 SENSITIVE Sensitive     AMPICILLIN/SULBACTAM <=2 SENSITIVE Sensitive     PIP/TAZO <=4 SENSITIVE Sensitive     * 80,000 COLONIES/ml ESCHERICHIA COLI  Culture, blood (x 2)     Status: None (Preliminary result)   Collection Time: 04/30/15 10:36 PM  Result Value Ref Range  Status   Specimen Description BLOOD RIGHT ANTECUBITAL  Final   Special Requests BOTTLES DRAWN AEROBIC AND ANAEROBIC 5CC  Final   Culture NO GROWTH 2 DAYS  Final   Report Status PENDING  Incomplete  Culture, blood (x 2)     Status: None (Preliminary result)   Collection Time: 04/30/15 10:52 PM  Result Value Ref Range Status   Specimen Description BLOOD RIGHT HAND  Final   Special Requests BOTTLES DRAWN AEROBIC AND ANAEROBIC 5CC  Final   Culture NO GROWTH 2 DAYS  Final   Report Status PENDING  Incomplete      Studies/Results: No results found.  Medications:  Scheduled: . azithromycin  500 mg Oral Daily  . Buprenorphine HCl-Naloxone HCl  1 Film Sublingual BID  . cefpodoxime  200 mg Oral Q12H  . dextromethorphan-guaiFENesin  1 tablet Oral BID  . enoxaparin (LOVENOX) injection  40 mg Subcutaneous Q24H  . FLUoxetine  40 mg Oral Daily  . folic acid  1 mg Oral Daily  . ipratropium-albuterol  3 mL Nebulization TID  . irbesartan  150 mg Oral Daily  . multivitamin with minerals  1 tablet Oral Daily  . nicotine  21 mg Transdermal Daily  . pantoprazole  40 mg Oral Daily  . predniSONE  60 mg Oral Q breakfast  . pregabalin  200 mg Oral BID  . vitamin B-12  1,000 mcg Oral Daily   Continuous:   [id number], ibuprofen, LORazepam, promethazine  Assessment/Plan:  Principal Problem:   Acute respiratory failure with hypoxia (HCC) Active Problems:   Anxiety   Depression   Hypertension   Chronic low back pain   Tobacco abuse   CAP (community acquired pneumonia)   Pneumonitis   Asthma exacerbation   Hyponatremia    Acute respiratory failure with hypoxia Patient continues to improve. Continue to wean off of oxygen. Check room air saturations. Likely due to interstitial pneumonitis vs. CAP. Her BNP is 240, but no leg edema or JVD, and so less likely to have CHF. ABG reviewed. Influenza PCR is negative. She likely has a component of COPD as well.  Community acquired pneumonia with  sepsis Patient's lactic acid level was noted to be elevated. Patient was aggressively hydrated. Lactic acid level has improved. She has received almost 4-1/2 L of normal saline. Patient has normal oral intake. Okay to discontinue IV fluids. Change to oral antibiotics. Change to oral steroids. Strep antigen negative. Pro-calcitonin less than 0.1. Blood cultures are negative.   Hyponatremia and hypokalemia Likely due to hypovolemia as well as use of diuretics. Sodium levels have improved. Potassium is normal. Continue to monitor.  Essential hypertension  Continue home medications. Blood pressure is stable.   Normocytic anemia/vitamin 0000000 and folic acid deficiency Drop in hemoglobin is likely dilutional. No overt bleeding identified. Hemoglobin is stable. Supplement with vitamin 0000000 and folic acid. Continue to monitor.  Tobacco abuse Nicotine patch. Patient advised to stop smoking.  Depression and anxiety Continue home medications: Prozac, Suboxone  Chronic low back pain Has lumbar spinous stimulator. Continue ibuprofen and lyrica.  Dysuria UA does not suggest UTI. Cultures growing 80,000 colonies of Escherichia coli.   GERD Protonix   DVT Prophylaxis: Lovenox    Code Status: Full code  Family Communication: Discussed with the patient  Disposition Plan: Continue management as outlined above. Change to oral antibiotics today. Check room air saturations.      LOS: 3 days   [place] [facility] Pager [phone] 05/03/2015, 11:11 AM  If 7PM-7AM, please contact night-coverage at [email or web address], password [facility]

## 2015-05-04 LAB — RESPIRATORY VIRUS PANEL
Adenovirus: NEGATIVE
Influenza A: NEGATIVE
Influenza B: NEGATIVE
Metapneumovirus: NEGATIVE
Parainfluenza 1: NEGATIVE
Parainfluenza 2: NEGATIVE
Parainfluenza 3: NEGATIVE
Respiratory Syncytial Virus A: NEGATIVE
Respiratory Syncytial Virus B: NEGATIVE
Rhinovirus: NEGATIVE

## 2015-05-04 MED ORDER — CYANOCOBALAMIN 1000 MCG PO TABS: 1000.0000 ug | ORAL_TABLET | Freq: Every day | ORAL | Status: DC

## 2015-05-04 MED ORDER — CEFDINIR 300 MG PO CAPS: 300.0000 mg | ORAL_CAPSULE | Freq: Two times a day (BID) | ORAL | Status: DC

## 2015-05-04 MED ORDER — ADULT MULTIVITAMIN W/MINERALS CH: 1.0000 | ORAL_TABLET | Freq: Every day | ORAL | Status: DC

## 2015-05-04 MED ORDER — FOLIC ACID 1 MG PO TABS: 1.0000 mg | ORAL_TABLET | Freq: Every day | ORAL | Status: DC

## 2015-05-04 MED ORDER — NICOTINE 21 MG/24HR TD PT24: 21.0000 mg | MEDICATED_PATCH | Freq: Every day | TRANSDERMAL | Status: DC

## 2015-05-04 NOTE — Discharge Summary (Signed)
[facility]  Physician Discharge Summary   Patient ID: Madeline Dickerson MRN: [id number] DOB/AGE: Nov 07, 1960 55 y.o.  Admit date: 04/30/2015 Discharge date: 05/04/2015  PCP: Elyn Peers, MD  DISCHARGE DIAGNOSES:  Principal Problem:   Acute respiratory failure with hypoxia ([name]) Active Problems:   Anxiety   Depression   Hypertension   Chronic low back pain   Tobacco abuse   CAP (community acquired pneumonia)   Pneumonitis   Asthma exacerbation   Hyponatremia   RECOMMENDATIONS FOR OUTPATIENT FOLLOW UP: 1. Patient instructed to follow-up with her PCP   DISCHARGE CONDITION: fair  Diet recommendation: As before  Filed Weights   04/30/15 1504 05/02/15 0500  Weight: 87.544 kg (193 lb) 100.3 kg (221 lb 1.9 oz)    INITIAL HISTORY: 55 year old Caucasian female with past medical history of hypertension, GERD, depression, chronic back pain, presented with cough and shortness of breath. Patient was found to have an elevated WBC. She was noted to be hypoxic. Chest x-ray was concerning for interstitial infiltrates. Patient was also noted to have elevated lactic acid level. She was hospitalized for further management.   HOSPITAL COURSE:   Acute respiratory failure with hypoxia Thought to be secondary to interstitial pneumonitis versus community-acquired pneumonia. Patient was started on antibiotics. She was placed on oxygen. She slowly started improving. She was given steroids and nebulizer treatments. Influenza PCR was negative. Now she is able to ambulate with saturations staying above 89%.   Community acquired pneumonia with sepsis Patient's lactic acid level was noted to be elevated. Patient was aggressively hydrated. Lactic acid level improved. She received almost 4-1/2 L of normal saline. She was started on antibiotics as discussed above.Strep antigen negative. Pro-calcitonin less than 0.1. Blood cultures are negative. Changed to oral antibiotics. Feels much  better.  Hyponatremia and hypokalemia Likely due to hypovolemia as well as use of diuretics. Sodium levels have improved. Potassium is now normal.   Essential hypertension  Continue home medications. Blood pressure is stable.   Normocytic anemia/vitamin 0000000 and folic acid deficiency Drop in hemoglobin is likely dilutional. No overt bleeding identified. Hemoglobin remained stable subsequently. Supplement with vitamin 0000000 and folic acid.   Tobacco abuse Nicotine patch. Patient advised to stop smoking.  Depression and anxiety Continue home medications: Prozac, Suboxone  Chronic low back pain Has lumbar spinous stimulator. Continue ibuprofen and lyrica.  Dysuria/UTI UA does not suggest UTI. However Cultures growing 80,000 colonies of Escherichia coli. Cannot clinically determine if she had a true UTI.  GERD Protonix  Overall improved. Stable for discharge today.   PERTINENT LABS:  The results of significant diagnostics from this hospitalization (including imaging, microbiology, ancillary and laboratory) are listed below for reference.    Microbiology: Recent Results (from the past 240 hour(s))  Urine culture     Status: None   Collection Time: 04/30/15 10:06 PM  Result Value Ref Range Status   Specimen Description URINE, RANDOM  Final   Special Requests NONE  Final   Culture 80,000 COLONIES/ml ESCHERICHIA COLI  Final   Report Status 05/03/2015 FINAL  Final   Organism ID, Bacteria ESCHERICHIA COLI  Final      Susceptibility   Escherichia coli - MIC*    AMPICILLIN <=2 SENSITIVE Sensitive     CEFAZOLIN <=4 SENSITIVE Sensitive     CEFTRIAXONE <=1 SENSITIVE Sensitive     CIPROFLOXACIN <=0.25 SENSITIVE Sensitive     GENTAMICIN <=1 SENSITIVE Sensitive     IMIPENEM <=0.25 SENSITIVE Sensitive     NITROFURANTOIN <=16  SENSITIVE Sensitive     TRIMETH/SULFA <=20 SENSITIVE Sensitive     AMPICILLIN/SULBACTAM <=2 SENSITIVE Sensitive     PIP/TAZO <=4 SENSITIVE Sensitive     *  80,000 COLONIES/ml ESCHERICHIA COLI  Culture, blood (x 2)     Status: None (Preliminary result)   Collection Time: 04/30/15 10:36 PM  Result Value Ref Range Status   Specimen Description BLOOD RIGHT ANTECUBITAL  Final   Special Requests BOTTLES DRAWN AEROBIC AND ANAEROBIC 5CC  Final   Culture NO GROWTH 3 DAYS  Final   Report Status PENDING  Incomplete  Culture, blood (x 2)     Status: None (Preliminary result)   Collection Time: 04/30/15 10:52 PM  Result Value Ref Range Status   Specimen Description BLOOD RIGHT HAND  Final   Special Requests BOTTLES DRAWN AEROBIC AND ANAEROBIC 5CC  Final   Culture NO GROWTH 3 DAYS  Final   Report Status PENDING  Incomplete     Labs: Basic Metabolic Panel:  Recent Labs Lab 04/30/15 1533 05/01/15 0830 05/02/15 0331 05/03/15 0158  NA 129* 128* 136 134*  K 4.0 3.4* 4.7 4.9  CL 92* 96* 105 102  CO2 22 17* 19* 20*  GLUCOSE 113* 160* 124* 125*  BUN 8 10 14 15   CREATININE 0.80 0.76 0.84 0.72  CALCIUM 8.8* 8.0* 8.5* 8.7*   CBC:  Recent Labs Lab 04/30/15 1533 05/01/15 0830 05/02/15 0331 05/03/15 0158  WBC 17.6* 11.9* 12.7* 15.0*  HGB 10.6* 9.0* 9.7* 9.4*  HCT 30.4* 27.8* 30.1* 29.7*  MCV 85.6 86.6 88.5 89.5  PLT 472* 400 418* 367   BNP: BNP (last 3 results)  Recent Labs  04/30/15 2007  BNP 240.6*     IMAGING STUDIES Dg Chest 2 View  04/30/2015  CLINICAL DATA:  Cough, shortness of breath for 1 month EXAM: CHEST  2 VIEW COMPARISON:  04/28/2015 FINDINGS: Cardiomediastinal silhouette is stable. Again noted diffuse bilateral reticular interstitial prominence highly suspicious for interstitial infiltrates or pneumonitis. Less likely pulmonary edema as there is no any central vascular congestion. Mild degenerative changes thoracic spine. IMPRESSION: Again noted diffuse bilateral reticular interstitial prominence highly suspicious for interstitial infiltrates or pneumonitis. No segmental consolidation. Less likely pulmonary edema. Mild  degenerative changes lower thoracic spine Electronically Signed   By: Lahoma Crocker M.D.   On: 04/30/2015 15:44   Dg Chest 2 View  04/28/2015  CLINICAL DATA:  Chest congestion, fatigue and fever at home for 2 weeks, falling, history hypertension, smoking EXAM: CHEST  2 VIEW COMPARISON:  11/05/2014 FINDINGS: Minimal enlargement of cardiac silhouette with pulmonary vascular congestion. Mediastinal contours normal. Diffuse interstitial infiltrates bilaterally question pulmonary edema though atypical infection could have a similar appearance. No pleural effusion or pneumothorax. Bones unremarkable. IMPRESSION: Minimal enlargement of cardiac silhouette with pulmonary vascular congestion. New interstitial infiltrates diffusely throughout both lungs question pulmonary edema though atypical infection not excluded. Electronically Signed   By: Lavonia Dana M.D.   On: 04/28/2015 09:58    DISCHARGE EXAMINATION: Filed Vitals:   05/04/15 0333 05/04/15 0713 05/04/15 1117 05/04/15 1145  BP: 143/76     Pulse: 69     Temp: 98.1 F (36.7 C)     TempSrc: Oral     Resp: 16     Height:      Weight:      SpO2: 93% 98% 94% 90%   General appearance: alert, cooperative, appears stated age and no distress Resp: Improved air entry bilaterally. No crackles, wheezing or rhonchi. Cardio:  regular rate and rhythm, S1, S2 normal, no murmur, click, rub or gallop GI: soft, non-tender; bowel sounds normal; no masses,  no organomegaly Extremities: extremities normal, atraumatic, no cyanosis or edema  DISPOSITION: Home  Discharge Instructions    Call MD for:  difficulty breathing, headache or visual disturbances    Complete by:  As directed      Call MD for:  extreme fatigue    Complete by:  As directed      Call MD for:  persistant dizziness or light-headedness    Complete by:  As directed      Call MD for:  persistant nausea and vomiting    Complete by:  As directed      Call MD for:  severe uncontrolled pain    Complete  by:  As directed      Call MD for:  temperature >100.4    Complete by:  As directed      Diet - low sodium heart healthy    Complete by:  As directed      Discharge instructions    Complete by:  As directed   Please be sure to follow up with your PCP within 1 week. Please stop smoking.  You were cared for by a hospitalist during your hospital stay. If you have any questions about your discharge medications or the care you received while you were in the hospital after you are discharged, you can call the unit and asked to speak with the hospitalist on call if the hospitalist that took care of you is not available. Once you are discharged, your primary care physician will handle any further medical issues. Please note that NO REFILLS for any discharge medications will be authorized once you are discharged, as it is imperative that you return to your primary care physician (or establish a relationship with a primary care physician if you do not have one) for your aftercare needs so that they can reassess your need for medications and monitor your lab values. If you do not have a primary care physician, you can call [phone] for a physician referral.     Increase activity slowly    Complete by:  As directed            ALLERGIES:  Allergies  Allergen Reactions  . Haldol [Haloperidol Lactate] Other (See Comments)    Couldn't talk, couldn't move   . Naproxen Nausea And Vomiting    Current Discharge Medication List    START taking these medications   Details  cefdinir (OMNICEF) 300 MG capsule Take 1 capsule (300 mg total) by mouth 2 (two) times daily. For 5 days Qty: 10 capsule, Refills: 0    folic acid (FOLVITE) 1 MG tablet Take 1 tablet (1 mg total) by mouth daily. Qty: 30 tablet, Refills: 2    Multiple Vitamin (MULTIVITAMIN WITH MINERALS) TABS tablet Take 1 tablet by mouth daily. Qty: 30 tablet, Refills: 2    nicotine (NICODERM CQ - DOSED IN MG/24 HOURS) 21 mg/24hr patch Place 1  patch (21 mg total) onto the skin daily. Qty: 28 patch, Refills: 0    vitamin B-12 1000 MCG tablet Take 1 tablet (1,000 mcg total) by mouth daily. Qty: 30 tablet, Refills: 3      CONTINUE these medications which have NOT CHANGED   Details  FLUoxetine (PROZAC) 40 MG capsule Take 1 capsule (40 mg total) by mouth daily. Qty: 30 capsule, Refills: 3    ibuprofen (ADVIL,MOTRIN) 200 MG tablet  Take 400 mg by mouth every 4 (four) hours as needed for fever or moderate pain.    LYRICA 200 MG capsule Take 200 mg by mouth 2 (two) times daily. Refills: 2    omeprazole (PRILOSEC) 40 MG capsule Take 40 mg by mouth daily.    predniSONE (DELTASONE) 10 MG tablet Take 1 tablet (10 mg total) by mouth once. Take q day 6,5,4,3,2,1 Qty: 21 tablet, Refills: 0    promethazine (PHENERGAN) 50 MG tablet Take 50 mg by mouth every 6 (six) hours as needed for nausea or vomiting.    SUBOXONE 8-2 MG FILM Place 1 Film under the tongue 2 (two) times daily.    valsartan-hydrochlorothiazide (DIOVAN-HCT) 160-12.5 MG per tablet Take 1 tablet by mouth daily. Reported on 04/30/2015       Follow-up Information    Follow up with Elyn Peers, MD. Schedule an appointment as soon as possible for a visit in 1 week.   Specialty:  Family Medicine   Why:  post hospitalization follow up   Contact information:   [address] [place] [address] [phone]       TOTAL DISCHARGE TIME: 70 minutes  [facility] Pager [phone]  05/04/2015, 2:52 PM

## 2015-05-04 NOTE — Progress Notes (Signed)
Ambulated with nursing tech - 350 ft; O2 SATs fluctuated between 89-95% on room air - tolerated walk with minimal dyspnea.

## 2015-05-04 NOTE — Care Management Note (Signed)
Case Management Note Marvetta Gibbons RN, BSN Unit 2W-Case Manager [phone]  Patient Details  Name: DARIKA STUBENRAUCH MRN: [id number] Date of Birth: [birth year]  Subjective/Objective:    Pt admitted with resp. failure                Action/Plan: PTA pt lived at home- independent- asked to see pt by bedside RN- regarding potential medication needs- spoke with pt at bedside- per conversation pt reports that she does not get check until next week - and does not have any money for prescriptions at discharge- pt does confirm that she has Medicaid and usually pays $1.50- $3 for medications- pt does not qualify for any medication assistance at this time- pt states she will see if friend can loan her some cash to get her meds at discharge- she reports she does have an inhaler at home.  Expected Discharge Date:                  Expected Discharge Plan:  Home/Self Care  In-House Referral:     Discharge planning Services  CM Consult, Medication Assistance  Post Acute Care Choice:    Choice offered to:     DME Arranged:    DME Agency:     HH Arranged:    [place] Agency:     Status of Service:  Completed, signed off  Medicare Important Message Given:  Yes Date Medicare IM Given:    Medicare IM give by:    Date Additional Medicare IM Given:    Additional Medicare Important Message give by:     If discussed at [place] of Stay Meetings, dates discussed:    Additional Comments:  Dawayne Patricia, RN 05/04/2015, 12:08 PM

## 2015-05-04 NOTE — Discharge Instructions (Signed)

## 2015-05-04 NOTE — Clinical Documentation Improvement (Signed)
Hospitalist  Abnormal Lab/Test Results:  Urine culture with >80,000 colonies with e coli; urinalysis does not suggest UTI.  Possible Clinical Conditions associated with below indicators  UTI from ecoli  Other Condition  Cannot Clinically Determine   Supporting Information: Pt states that she has burning on urination.    Please exercise your independent, professional judgment when responding. A specific answer is not anticipated or expected.   Thank You,  Curtice [phone]

## 2015-05-04 NOTE — Care Management Important Message (Signed)
Important Message  Patient Details  Name: Madeline Dickerson MRN: [id number] Date of Birth: 1960/04/04   Medicare Important Message Given:  Yes    [name] May 05/04/2015, 9:49 AM

## 2015-05-06 LAB — CULTURE, BLOOD (ROUTINE X 2)
Culture: NO GROWTH
Culture: NO GROWTH

## 2015-06-01 ENCOUNTER — Encounter (HOSPITAL_COMMUNITY): Payer: Self-pay | Admitting: *Deleted

## 2015-06-01 ENCOUNTER — Inpatient Hospital Stay (HOSPITAL_COMMUNITY)
Admission: EM | Admit: 2015-06-01 | Discharge: 2015-06-04 | DRG: 641 | Disposition: A | Discharge status: Home / Self Care | Payer: Medicare Other | Attending: Family Medicine | Admitting: Family Medicine

## 2015-06-01 ENCOUNTER — Emergency Department (HOSPITAL_COMMUNITY): Payer: Medicare Other

## 2015-06-01 DIAGNOSIS — M797 Fibromyalgia: Secondary | ICD-10-CM | POA: Diagnosis present

## 2015-06-01 DIAGNOSIS — F3162 Bipolar disorder, current episode mixed, moderate: Secondary | ICD-10-CM | POA: Diagnosis present

## 2015-06-01 DIAGNOSIS — R0600 Dyspnea, unspecified: Secondary | ICD-10-CM | POA: Diagnosis present

## 2015-06-01 DIAGNOSIS — F17210 Nicotine dependence, cigarettes, uncomplicated: Secondary | ICD-10-CM | POA: Diagnosis not present

## 2015-06-01 DIAGNOSIS — R05 Cough: Secondary | ICD-10-CM | POA: Diagnosis not present

## 2015-06-01 DIAGNOSIS — E875 Hyperkalemia: Secondary | ICD-10-CM | POA: Diagnosis present

## 2015-06-01 DIAGNOSIS — Z981 Arthrodesis status: Secondary | ICD-10-CM

## 2015-06-01 DIAGNOSIS — Z716 Tobacco abuse counseling: Secondary | ICD-10-CM

## 2015-06-01 DIAGNOSIS — J45901 Unspecified asthma with (acute) exacerbation: Secondary | ICD-10-CM | POA: Diagnosis not present

## 2015-06-01 DIAGNOSIS — M545 Low back pain: Secondary | ICD-10-CM | POA: Diagnosis present

## 2015-06-01 DIAGNOSIS — R0602 Shortness of breath: Secondary | ICD-10-CM | POA: Diagnosis not present

## 2015-06-01 DIAGNOSIS — I10 Essential (primary) hypertension: Secondary | ICD-10-CM | POA: Diagnosis present

## 2015-06-01 DIAGNOSIS — F3160 Bipolar disorder, current episode mixed, unspecified: Secondary | ICD-10-CM | POA: Diagnosis not present

## 2015-06-01 DIAGNOSIS — Z720 Tobacco use: Secondary | ICD-10-CM | POA: Diagnosis present

## 2015-06-01 DIAGNOSIS — G8929 Other chronic pain: Secondary | ICD-10-CM | POA: Diagnosis present

## 2015-06-01 DIAGNOSIS — K219 Gastro-esophageal reflux disease without esophagitis: Secondary | ICD-10-CM | POA: Diagnosis present

## 2015-06-01 DIAGNOSIS — E871 Hypo-osmolality and hyponatremia: Principal | ICD-10-CM | POA: Diagnosis present

## 2015-06-01 DIAGNOSIS — E860 Dehydration: Secondary | ICD-10-CM | POA: Diagnosis not present

## 2015-06-01 DIAGNOSIS — F419 Anxiety disorder, unspecified: Secondary | ICD-10-CM | POA: Diagnosis present

## 2015-06-01 HISTORY — DX: Reserved for inherently not codable concepts without codable children: IMO0001

## 2015-06-01 LAB — BASIC METABOLIC PANEL WITH GFR
Anion gap: 13 (ref 5–15)
BUN: 6 mg/dL (ref 6–20)
CO2: 20 mmol/L — ABNORMAL LOW (ref 22–32)
Calcium: 8.6 mg/dL — ABNORMAL LOW (ref 8.9–10.3)
Chloride: 88 mmol/L — ABNORMAL LOW (ref 101–111)
Creatinine, Ser: 0.74 mg/dL (ref 0.44–1.00)
GFR calc Af Amer: >60 mL/min
GFR calc non Af Amer: >60 mL/min
Glucose, Bld: 91 mg/dL (ref 65–99)
Potassium: 5.5 mmol/L — ABNORMAL HIGH (ref 3.5–5.1)
Sodium: 121 mmol/L — ABNORMAL LOW (ref 135–145)

## 2015-06-01 LAB — CBC
HCT: 33.2 % — ABNORMAL LOW (ref 36.0–46.0)
Hemoglobin: 11.2 g/dL — ABNORMAL LOW (ref 12.0–15.0)
MCH: 28.8 pg (ref 26.0–34.0)
MCHC: 33.7 g/dL (ref 30.0–36.0)
MCV: 85.3 fL (ref 78.0–100.0)
Platelets: 359 K/uL (ref 150–400)
RBC: 3.89 MIL/uL (ref 3.87–5.11)
RDW: 16 % — ABNORMAL HIGH (ref 11.5–15.5)
WBC: 8.8 K/uL (ref 4.0–10.5)

## 2015-06-01 LAB — I-STAT TROPONIN, ED: Troponin i, poc: 0 ng/mL (ref 0.00–0.08)

## 2015-06-01 LAB — BRAIN NATRIURETIC PEPTIDE: B Natriuretic Peptide: 62.3 pg/mL (ref 0.0–100.0)

## 2015-06-01 MED ORDER — IPRATROPIUM-ALBUTEROL 0.5-2.5 (3) MG/3ML IN SOLN
3.0000 mL | Freq: Once | RESPIRATORY_TRACT | Status: AC
  Administered 2015-06-01: 3 mL via RESPIRATORY_TRACT
  Filled 2015-06-01: qty 3

## 2015-06-01 MED ORDER — ALBUTEROL SULFATE (2.5 MG/3ML) 0.083% IN NEBU
2.5000 mg | INHALATION_SOLUTION | Freq: Once | RESPIRATORY_TRACT | Status: AC
  Administered 2015-06-01: 2.5 mg via RESPIRATORY_TRACT
  Filled 2015-06-01: qty 3

## 2015-06-01 NOTE — ED Provider Notes (Signed)
CSN: [id number]     Arrival date & time 06/01/15  1933 History   First MD Initiated Contact with Patient 06/01/15 2256     Chief Complaint  Patient presents with  . Shortness of Breath     (Consider location/radiation/quality/duration/timing/severity/associated sxs/prior Treatment) HPI Comments: Patient presents to the emergency department for evaluation of shortness of breath. Patient reports history of "seasonal asthma", but this year she has had more problems than usual. She was hospitalized a month ago for asthma complications. She has not been intubated. Patient reports that she started having increased wheezing approximately 2 weeks ago and for the last week things have gotten worse. She reports coughing spells that are uncontrollable. She reports that when she walks or exerts Associates out of breath and has to stop. She has not experienced chest pain associated with shortness of breath.  Patient is a 55 y.o. female presenting with shortness of breath.  Shortness of Breath Associated symptoms: cough     Past Medical History  Diagnosis Date  . Hypertension   . Depression   . GERD (gastroesophageal reflux disease)   . Rotator cuff disorder   . Seasonal asthma   . Walking pneumonia   . Pneumonia 05/01/2015  . Arthritis     "lower back, hands" (05/01/2015)  . Fibromyalgia   . Chronic low back pain     disk s/p 4 diskectomies, 5th fursion, then spinal cord stimulator ([place])  . Anxiety     hx   Past Surgical History  Procedure Laterality Date  . Lumbar microdiscectomy  01/1990; 2003  . Spinal cord stimulator implant  2012  . Cesarean section  1986  . Posterior lumbar fusion  2011; 04/27/2011  . Back surgery    . Tubal ligation  1986  . Spinal cord stimulator removal  08/2010  . Repair dural / csf leak  02/1990  . Lumbar spine surgery  03/1991    "cleaned up scar tissue"   Family History  Problem Relation Age of Onset  . Stroke Father   . Hypertension Father    Social  History  Substance Use Topics  . Smoking status: Current Every Day Smoker -- 1.00 packs/day for 42 years    Types: Cigarettes  . Smokeless tobacco: Never Used     Comment: prior trial of zyban made her feel weird   . Alcohol Use: No   OB History    No data available     Review of Systems  Constitutional: Positive for fatigue.  Respiratory: Positive for cough and shortness of breath.   All other systems reviewed and are negative.     Allergies  Haldol and Naproxen  Home Medications   Prior to Admission medications   Medication Sig Start Date End Date Taking? Authorizing Provider  FLUoxetine (PROZAC) 40 MG capsule Take 1 capsule (40 mg total) by mouth daily. 11/05/14  Yes Kaitlyn Szekalski, PA-C  folic acid (FOLVITE) 1 MG tablet Take 1 tablet (1 mg total) by mouth daily. 05/04/15  Yes Bonnielee Haff, MD  LYRICA 200 MG capsule Take 200 mg by mouth 2 (two) times daily. 11/01/14  Yes Historical Provider, MD  omeprazole (PRILOSEC) 40 MG capsule Take 40 mg by mouth daily.   Yes Historical Provider, MD  SUBOXONE 8-2 MG FILM Place 1 Film under the tongue 2 (two) times daily. 04/30/15  Yes Historical Provider, MD  valsartan-hydrochlorothiazide (DIOVAN-HCT) 160-12.5 MG per tablet Take 1 tablet by mouth daily. Reported on 04/30/2015   Yes Historical Provider,  MD  cefdinir (OMNICEF) 300 MG capsule Take 1 capsule (300 mg total) by mouth 2 (two) times daily. For 5 days Patient not taking: Reported on 06/01/2015 05/04/15   Bonnielee Haff, MD  Multiple Vitamin (MULTIVITAMIN WITH MINERALS) TABS tablet Take 1 tablet by mouth daily. Patient not taking: Reported on 06/01/2015 05/04/15   Bonnielee Haff, MD  nicotine (NICODERM CQ - DOSED IN MG/24 HOURS) 21 mg/24hr patch Place 1 patch (21 mg total) onto the skin daily. Patient not taking: Reported on 06/01/2015 05/04/15   Bonnielee Haff, MD  predniSONE (DELTASONE) 10 MG tablet Take 1 tablet (10 mg total) by mouth once. Take q day 6,5,4,3,2,1 Patient not taking:  Reported on 06/01/2015 04/28/15   Daleen Bo, MD  vitamin B-12 1000 MCG tablet Take 1 tablet (1,000 mcg total) by mouth daily. Patient not taking: Reported on 06/01/2015 05/04/15   Bonnielee Haff, MD   BP 94/50 mmHg  Pulse 74  Resp 14  SpO2 93%  LMP 08/28/2012 Physical Exam  Constitutional: She is oriented to person, place, and time. She appears well-developed and well-nourished. No distress.  HENT:  Head: Normocephalic and atraumatic.  Right Ear: Hearing normal.  Left Ear: Hearing normal.  Nose: Nose normal.  Mouth/Throat: Oropharynx is clear and moist and mucous membranes are normal.  Eyes: Conjunctivae and EOM are normal. Pupils are equal, round, and reactive to light.  Neck: Normal range of motion. Neck supple.  Cardiovascular: Regular rhythm, S1 normal and S2 normal.  Exam reveals no gallop and no friction rub.   No murmur heard. Pulmonary/Chest: Effort normal. No respiratory distress. She has decreased breath sounds. She has wheezes. She exhibits no tenderness.  Abdominal: Soft. Normal appearance and bowel sounds are normal. There is no hepatosplenomegaly. There is no tenderness. There is no rebound, no guarding, no tenderness at McBurney's point and negative Murphy's sign. No hernia.  Musculoskeletal: Normal range of motion.  Neurological: She is alert and oriented to person, place, and time. She has normal strength. No cranial nerve deficit or sensory deficit. Coordination normal. GCS eye subscore is 4. GCS verbal subscore is 5. GCS motor subscore is 6.  Skin: Skin is warm, dry and intact. No rash noted. No cyanosis.  Psychiatric: She has a normal mood and affect. Her speech is normal and behavior is normal. Thought content normal.  Nursing note and vitals reviewed.   ED Course  Procedures (including critical care time) Labs Review Labs Reviewed  BASIC METABOLIC PANEL - Abnormal; Notable for the following:    Sodium 121 (*)    Potassium 5.5 (*)    Chloride 88 (*)    CO2 20  (*)    Calcium 8.6 (*)    All other components within normal limits  CBC - Abnormal; Notable for the following:    Hemoglobin 11.2 (*)    HCT 33.2 (*)    RDW 16.0 (*)    All other components within normal limits  BASIC METABOLIC PANEL - Abnormal; Notable for the following:    Sodium 120 (*)    Potassium 3.3 (*)    Chloride 86 (*)    CO2 21 (*)    Glucose, Bld 121 (*)    Calcium 8.4 (*)    All other components within normal limits  TROPONIN I  BRAIN NATRIURETIC PEPTIDE  I-STAT TROPOININ, ED    Imaging Review Dg Chest 2 View  06/01/2015  CLINICAL DATA:  Acute onset of shortness of breath and cough. Fatigue. Initial encounter. EXAM: CHEST  2 VIEW COMPARISON:  Chest radiograph performed 04/30/2015 FINDINGS: The lungs are well-aerated. Vascular congestion is noted. Mild bibasilar opacities may reflect mild interstitial edema. There is no evidence of pleural effusion or pneumothorax. The heart is borderline enlarged. No acute osseous abnormalities are seen. IMPRESSION: Vascular congestion and borderline cardiomegaly. Bibasilar airspace opacities may reflect mild interstitial edema. Electronically Signed   By: Garald Balding M.D.   On: 06/01/2015 20:27   I have personally reviewed and evaluated these images and lab results as part of my medical decision-making.   EKG Interpretation   Date/Time:  Monday June 01 2015 19:45:27 EDT Ventricular Rate:  87 PR Interval:  158 QRS Duration: 78 QT Interval:  354 QTC Calculation: 425 R Axis:   -13 Text Interpretation:  Normal sinus rhythm Normal ECG Confirmed by FLOYD  MD, Quillian Quince [id number]) on 06/01/2015 10:44:52 PM      MDM   Final diagnoses:  Hyponatremia  Asthma exacerbation    Presents to the emergency department for evaluation of increased shortness of breath. Patient did have recent admission for status asthmaticus and pneumonia. She reports that over the last 1-2 weeks her breathing has worsened. She had very tight lungs on arrival,  not moving much air. This did not prove significantly with nebulizer treatment. X-ray does show bibasilar airspace opacities. This is felt to possibly be interstitial edema. With her current presentation, however, recurrent pneumonia has to be considered. Not have an elevated BNP, JVD or lower extremity edema.  Patient was ambulated in the ER and did exhibit desaturation into the 80s.   She also found to be hyponatremic. She did have hyponatremia at previous hospitalization, felt to be secondary to her diuretic. Sodium was 128 at admission, but improved into the normal range by time of discharge. Her sodium today is 120, more profound hyponatremia than previous.    Orpah Greek, MD 06/02/15 [phone]

## 2015-06-01 NOTE — ED Notes (Addendum)
Pt in c/o increased shortness of breath over the last week, reports increased cough at times, recently admitted to hospital for same, pt states she starts walking and has to stop due to fatigue and shortness of breath. Pt speaking in full sentences, no distress noted in triage

## 2015-06-02 ENCOUNTER — Encounter (HOSPITAL_COMMUNITY): Payer: Self-pay | Admitting: Family Medicine

## 2015-06-02 DIAGNOSIS — I10 Essential (primary) hypertension: Secondary | ICD-10-CM | POA: Diagnosis not present

## 2015-06-02 DIAGNOSIS — Z720 Tobacco use: Secondary | ICD-10-CM

## 2015-06-02 DIAGNOSIS — R0600 Dyspnea, unspecified: Secondary | ICD-10-CM | POA: Diagnosis not present

## 2015-06-02 DIAGNOSIS — G8929 Other chronic pain: Secondary | ICD-10-CM

## 2015-06-02 DIAGNOSIS — E871 Hypo-osmolality and hyponatremia: Secondary | ICD-10-CM | POA: Diagnosis not present

## 2015-06-02 DIAGNOSIS — M545 Low back pain: Secondary | ICD-10-CM | POA: Diagnosis not present

## 2015-06-02 DIAGNOSIS — J9601 Acute respiratory failure with hypoxia: Secondary | ICD-10-CM

## 2015-06-02 LAB — BASIC METABOLIC PANEL
Anion gap: 13 (ref 5–15)
Anion gap: 16 — ABNORMAL HIGH (ref 5–15)
Anion gap: 13 (ref 5–15)
BUN: 7 mg/dL (ref 6–20)
BUN: 8 mg/dL (ref 6–20)
BUN: 8 mg/dL (ref 6–20)
CO2: 21 mmol/L — ABNORMAL LOW (ref 22–32)
CO2: 15 mmol/L — ABNORMAL LOW (ref 22–32)
CO2: 18 mmol/L — ABNORMAL LOW (ref 22–32)
Calcium: 8.4 mg/dL — ABNORMAL LOW (ref 8.9–10.3)
Calcium: 8.1 mg/dL — ABNORMAL LOW (ref 8.9–10.3)
Calcium: 8.4 mg/dL — ABNORMAL LOW (ref 8.9–10.3)
Chloride: 86 mmol/L — ABNORMAL LOW (ref 101–111)
Chloride: 87 mmol/L — ABNORMAL LOW (ref 101–111)
Chloride: 87 mmol/L — ABNORMAL LOW (ref 101–111)
Creatinine, Ser: 0.71 mg/dL (ref 0.44–1.00)
Creatinine, Ser: 0.9 mg/dL (ref 0.44–1.00)
Creatinine, Ser: 0.77 mg/dL (ref 0.44–1.00)
GFR calc Af Amer: >60 mL/min (ref >60)
GFR calc Af Amer: >60 mL/min (ref >60)
GFR calc Af Amer: >60 mL/min (ref >60)
GFR calc non Af Amer: >60 mL/min (ref >60)
GFR calc non Af Amer: >60 mL/min (ref >60)
GFR calc non Af Amer: >60 mL/min (ref >60)
Glucose, Bld: 121 mg/dL — ABNORMAL HIGH (ref 65–99)
Glucose, Bld: 251 mg/dL — ABNORMAL HIGH (ref 65–99)
Glucose, Bld: 277 mg/dL — ABNORMAL HIGH (ref 65–99)
Potassium: 3.3 mmol/L — ABNORMAL LOW (ref 3.5–5.1)
Potassium: 3.4 mmol/L — ABNORMAL LOW (ref 3.5–5.1)
Potassium: 3.9 mmol/L (ref 3.5–5.1)
Sodium: 120 mmol/L — ABNORMAL LOW (ref 135–145)
Sodium: 118 mmol/L — CL (ref 135–145)
Sodium: 118 mmol/L — CL (ref 135–145)

## 2015-06-02 LAB — BLOOD GAS, ARTERIAL
Acid-base deficit: 9 mmol/L — ABNORMAL HIGH (ref 0.0–2.0)
Bicarbonate: 15.6 mEq/L — ABNORMAL LOW (ref 20.0–24.0)
Drawn by: 44898
FIO2: 0.21
O2 Saturation: 97.8 %
Patient temperature: 98.6
TCO2: 16.5 mmol/L (ref 0–100)
pCO2 arterial: 29.7 mmHg — ABNORMAL LOW (ref 35.0–45.0)
pH, Arterial: 7.341 — ABNORMAL LOW (ref 7.350–7.450)
pO2, Arterial: 113 mmHg — ABNORMAL HIGH (ref 80.0–100.0)

## 2015-06-02 LAB — TROPONIN I: Troponin I: <0.03 ng/mL (ref <0.031)

## 2015-06-02 LAB — CBC
HCT: 28.8 % — ABNORMAL LOW (ref 36.0–46.0)
Hemoglobin: 9.4 g/dL — ABNORMAL LOW (ref 12.0–15.0)
MCH: 28.1 pg (ref 26.0–34.0)
MCHC: 32.6 g/dL (ref 30.0–36.0)
MCV: 86.2 fL (ref 78.0–100.0)
Platelets: 380 10*3/uL (ref 150–400)
RBC: 3.34 MIL/uL — ABNORMAL LOW (ref 3.87–5.11)
RDW: 16 % — ABNORMAL HIGH (ref 11.5–15.5)
WBC: 8.6 10*3/uL (ref 4.0–10.5)

## 2015-06-02 LAB — OSMOLALITY: Osmolality: 256 mOsm/kg — ABNORMAL LOW (ref 275–295)

## 2015-06-02 LAB — OSMOLALITY, URINE: Osmolality, Ur: 173 mOsm/kg — ABNORMAL LOW (ref 300–900)

## 2015-06-02 LAB — SODIUM, URINE, RANDOM: Sodium, Ur: <10 mmol/L

## 2015-06-02 MED ORDER — ONDANSETRON HCL 4 MG/2ML IJ SOLN: 4.0000 mg | Freq: Four times a day (QID) | INTRAMUSCULAR | Status: DC | PRN

## 2015-06-02 MED ORDER — PANTOPRAZOLE SODIUM 40 MG PO TBEC
80.0000 mg | DELAYED_RELEASE_TABLET | Freq: Every day | ORAL | Status: DC
  Administered 2015-06-02: 80 mg via ORAL
  Filled 2015-06-02 (×2): qty 2

## 2015-06-02 MED ORDER — ALBUTEROL (5 MG/ML) CONTINUOUS INHALATION SOLN
10.0000 mg/h | INHALATION_SOLUTION | Freq: Once | RESPIRATORY_TRACT | Status: AC
  Administered 2015-06-02: 10 mg/h via RESPIRATORY_TRACT
  Filled 2015-06-02: qty 20

## 2015-06-02 MED ORDER — ACETAMINOPHEN 650 MG RE SUPP: 650.0000 mg | Freq: Four times a day (QID) | RECTAL | Status: DC | PRN

## 2015-06-02 MED ORDER — PREGABALIN 100 MG PO CAPS
200.0000 mg | ORAL_CAPSULE | Freq: Two times a day (BID) | ORAL | Status: DC
  Administered 2015-06-02 – 2015-06-04 (×5): 200 mg via ORAL
  Filled 2015-06-02 (×5): qty 2

## 2015-06-02 MED ORDER — ENOXAPARIN SODIUM 40 MG/0.4ML ~~LOC~~ SOLN
40.0000 mg | SUBCUTANEOUS | Status: DC
  Administered 2015-06-02 – 2015-06-03 (×2): 40 mg via SUBCUTANEOUS
  Filled 2015-06-02 (×2): qty 0.4

## 2015-06-02 MED ORDER — BUPRENORPHINE HCL 8 MG SL SUBL
8.0000 mg | SUBLINGUAL_TABLET | Freq: Every day | SUBLINGUAL | Status: DC
  Administered 2015-06-02 – 2015-06-04 (×3): 8 mg via SUBLINGUAL
  Filled 2015-06-02 (×3): qty 1

## 2015-06-02 MED ORDER — IRBESARTAN 300 MG PO TABS
150.0000 mg | ORAL_TABLET | Freq: Every day | ORAL | Status: DC
Start: 2015-06-02 — End: 2015-06-03
  Administered 2015-06-02: 150 mg via ORAL
  Filled 2015-06-02: qty 1

## 2015-06-02 MED ORDER — SODIUM CHLORIDE 0.9 % IV BOLUS (SEPSIS)
1000.0000 mL | Freq: Once | INTRAVENOUS | Status: AC
  Administered 2015-06-02: 1000 mL via INTRAVENOUS

## 2015-06-02 MED ORDER — ONDANSETRON HCL 4 MG PO TABS: 4.0000 mg | ORAL_TABLET | Freq: Four times a day (QID) | ORAL | Status: DC | PRN

## 2015-06-02 MED ORDER — LORAZEPAM 1 MG PO TABS
1.0000 mg | ORAL_TABLET | Freq: Four times a day (QID) | ORAL | Status: DC | PRN
  Administered 2015-06-02 – 2015-06-04 (×5): 1 mg via ORAL
  Filled 2015-06-02 (×5): qty 1

## 2015-06-02 MED ORDER — ALBUTEROL SULFATE (2.5 MG/3ML) 0.083% IN NEBU
2.5000 mg | INHALATION_SOLUTION | Freq: Four times a day (QID) | RESPIRATORY_TRACT | Status: DC
  Administered 2015-06-02 (×3): 2.5 mg via RESPIRATORY_TRACT
  Filled 2015-06-02 (×3): qty 3

## 2015-06-02 MED ORDER — HYDROCHLOROTHIAZIDE 12.5 MG PO CAPS
12.5000 mg | ORAL_CAPSULE | Freq: Every day | ORAL | Status: DC
  Administered 2015-06-02: 12.5 mg via ORAL
  Filled 2015-06-02 (×2): qty 1

## 2015-06-02 MED ORDER — FOLIC ACID 1 MG PO TABS
1.0000 mg | ORAL_TABLET | Freq: Every day | ORAL | Status: DC
  Administered 2015-06-02 – 2015-06-04 (×3): 1 mg via ORAL
  Filled 2015-06-02 (×3): qty 1

## 2015-06-02 MED ORDER — METHYLPREDNISOLONE SODIUM SUCC 125 MG IJ SOLR
125.0000 mg | Freq: Once | INTRAMUSCULAR | Status: AC
  Administered 2015-06-02: 125 mg via INTRAVENOUS
  Filled 2015-06-02: qty 2

## 2015-06-02 MED ORDER — ACETAMINOPHEN 325 MG PO TABS: 650.0000 mg | ORAL_TABLET | Freq: Four times a day (QID) | ORAL | Status: DC | PRN

## 2015-06-02 MED ORDER — PREDNISONE 20 MG PO TABS
40.0000 mg | ORAL_TABLET | Freq: Every day | ORAL | Status: DC
  Administered 2015-06-02 – 2015-06-04 (×3): 40 mg via ORAL
  Filled 2015-06-02 (×3): qty 2

## 2015-06-02 MED ORDER — VALSARTAN-HYDROCHLOROTHIAZIDE 160-12.5 MG PO TABS: 1.0000 | ORAL_TABLET | Freq: Every day | ORAL | Status: DC

## 2015-06-02 MED ORDER — ALBUTEROL SULFATE (2.5 MG/3ML) 0.083% IN NEBU
2.5000 mg | INHALATION_SOLUTION | RESPIRATORY_TRACT | Status: DC | PRN
  Administered 2015-06-03: 2.5 mg via RESPIRATORY_TRACT
  Filled 2015-06-02: qty 3

## 2015-06-02 MED ORDER — SODIUM CHLORIDE 0.9 % IV SOLN
INTRAVENOUS | Status: DC
  Administered 2015-06-02 – 2015-06-03 (×2): via INTRAVENOUS
  Administered 2015-06-04: 75 mL/h via INTRAVENOUS

## 2015-06-02 MED ORDER — SENNOSIDES-DOCUSATE SODIUM 8.6-50 MG PO TABS: 1.0000 | ORAL_TABLET | Freq: Every evening | ORAL | Status: DC | PRN

## 2015-06-02 MED ORDER — FLUOXETINE HCL 20 MG PO CAPS
40.0000 mg | ORAL_CAPSULE | Freq: Every day | ORAL | Status: DC
  Administered 2015-06-02: 40 mg via ORAL
  Filled 2015-06-02: qty 2

## 2015-06-02 NOTE — ED Notes (Signed)
Attempted to call report x 2, nurse taking pt. Is suppose to call back.

## 2015-06-02 NOTE — ED Notes (Signed)
Breakfast Tray ordered @ [id number].

## 2015-06-02 NOTE — H&P (Signed)
History and Physical  Patient Name: Madeline Dickerson     [id number]    DOB: [birth year]    DOA: 06/01/2015 Referring physician: Malachy Moan, MD PCP: Elyn Peers, MD      Chief Complaint: Dyspnea  HPI: Madeline Dickerson is a 55 y.o. female with a past medical history significant for HTN, smoking and asthma who presents with dyspnea.  The patient was recently admitted from 3/923/13 for acute hypoxic respiratory failure. At that time she was noted to have reticular interstitial opacities bilaterally and a chest x-ray, elevated lactate, and the differential was interstitial pneumonitis versus, she was treated with steroids and antibiotics, and discharged when she started to improve. She did have some hyponatremia during that hospitalization, which was attributed to her HCTZ and dehydration, nadir 127, improved with fluids.  She tells me that she "felt better at first" after being treated with antibiotics in the hospital, but relatively soon after discharge started to feel short of breath again. This is predominantly dyspnea, and although she does endorse wheezing and cough and sputum, she feels that these are all about at her baseline, and what is really different is that if she tries to walk from one end of her mobile home to the other she has to stop halfway for shortness of breath. She denies leg swelling, change in abdominal girth or weight, has stable orthopnea. No fever, chills.  In the ED, she was afebrile and hemodynamically stable.  Na 121, K 5.5, Cr 0.7, troponin negative, BNP 62, WBC 8.8K, Hgb 11.2 (up from last discharge).  Chest x-ray showed bibasilar opacities with no focal consolidation. ECG showed no ischemia. She was given continuous hour-long albuterol nebulizer, which only somewhat improved her symptoms, and TRH were asked to evaluate for admission.     Review of Systems:  All other systems negative except as just noted or noted in the history of present illness.  Allergies   Allergen Reactions  . Haldol [Haloperidol Lactate] Other (See Comments)    Couldn't talk, couldn't move   . Naproxen Nausea And Vomiting    Prior to Admission medications   Medication Sig Start Date End Date Taking? Authorizing Provider  FLUoxetine (PROZAC) 40 MG capsule Take 1 capsule (40 mg total) by mouth daily. 11/05/14  Yes Kaitlyn Szekalski, PA-C  folic acid (FOLVITE) 1 MG tablet Take 1 tablet (1 mg total) by mouth daily. 05/04/15  Yes Bonnielee Haff, MD  LYRICA 200 MG capsule Take 200 mg by mouth 2 (two) times daily. 11/01/14  Yes Historical Provider, MD  omeprazole (PRILOSEC) 40 MG capsule Take 40 mg by mouth daily.   Yes Historical Provider, MD  SUBOXONE 8-2 MG FILM Place 1 Film under the tongue 2 (two) times daily. 04/30/15  Yes Historical Provider, MD  valsartan-hydrochlorothiazide (DIOVAN-HCT) 160-12.5 MG per tablet Take 1 tablet by mouth daily. Reported on 04/30/2015   Yes Historical Provider, MD    Past Medical History  Diagnosis Date  . Hypertension   . Depression   . GERD (gastroesophageal reflux disease)   . Rotator cuff disorder   . Seasonal asthma   . Walking pneumonia   . Pneumonia 05/01/2015  . Arthritis     "lower back, hands" (05/01/2015)  . Fibromyalgia   . Chronic low back pain     disk s/p 4 diskectomies, 5th fursion, then spinal cord stimulator ([facility])  . Anxiety     hx    Past Surgical History  Procedure Laterality Date  . Lumbar microdiscectomy  01/1990; 2003  . Spinal cord stimulator implant  2012  . Cesarean section  1986  . Posterior lumbar fusion  2011; 04/27/2011  . Back surgery    . Tubal ligation  1986  . Spinal cord stimulator removal  08/2010  . Repair dural / csf leak  02/1990  . Lumbar spine surgery  03/1991    "cleaned up scar tissue"    Family history: family history includes Hypertension in her father; Lung cancer in her father; Stroke in her father.  Social History: Patient lives With a roommate. She smokes. She is on disability.  She is normally able to complete all basic ADLs, but not lately because of dyspnea.       Physical Exam: BP 96/75 mmHg  Pulse 71  Resp 18  SpO2 97%  LMP 08/28/2012 General appearance: Well-developed, adult female, alert and in mild distress from dyspnea.   Eyes: Anicteric, conjunctiva pink, lids and lashes normal.     ENT: No nasal deformity, discharge, or epistaxis.  OP moist without lesions.   Lymph: No cervical or supraclavicular lymphadenopathy. Skin: Warm and dry.  No jaundice.  No suspicious rashes or lesions.  Old scars, well healed on back.   Cardiac: RRR, nl S1-S2, no murmurs appreciated.  Capillary refill is brisk.  JVP normal.  No LE edema.  Radial and DP pulses 2+ and symmetric. Respiratory: Increased work of breathing, on nebulizer.  No wheezes appreciated.  There are fine crackles at bases. Abdomen: Abdomen soft without rigidity.  No TTP. No ascites, distension.   MSK: No deformities or effusions. Neuro: Sensorium intact and responding to questions, attention normal.  Speech is fluent.  Moves all extremities equally and with normal coordination.    Psych: Behavior appropriate.  Affect normal.  No evidence of aural or visual hallucinations or delusions.       Labs on Admission:  The metabolic panel shows hyponatremia, hyperkalemia, normal renal function. Troponin normal. BNP low. The complete blood count shows no leukocytosis, stable anemia, improved from previous.   Radiological Exams on Admission: Personally reviewed: Dg Chest 2 View  06/01/2015  CLINICAL DATA:  Acute onset of shortness of breath and cough. Fatigue. Initial encounter. EXAM: CHEST  2 VIEW COMPARISON:  Chest radiograph performed 04/30/2015 FINDINGS: The lungs are well-aerated. Vascular congestion is noted. Mild bibasilar opacities may reflect mild interstitial edema. There is no evidence of pleural effusion or pneumothorax. The heart is borderline enlarged. No acute osseous abnormalities are seen.  IMPRESSION: Vascular congestion and borderline cardiomegaly. Bibasilar airspace opacities may reflect mild interstitial edema. Electronically Signed   By: Garald Balding M.D.   On: 06/01/2015 20:27    EKG: Independently reviewed. Rate 87, sinus rhythm, QTC 425. No ischemic changes.    Assessment/Plan 1. Dyspnea:  This is new.  During last hospitalization was treated with steroids and antibiotics and improved.  Procalcitonin was low, and CXR showed interstitial opacities.  Currently dyspnea alone is most prominent symptom.  Differential should include interstitial lung disease.  Infectious pneumonia is doubted now, given the time course.  CHF is ruled out with BNP and history.  No evidence of asthma on exam. -Prednisone 40 mg daily -Will try albuterol PRN for dyspnea -Consult to Pulmonology, appreciate cares   2. Hyponatremia:  This is new.  Worse than previous.  On HCTZ and fluoxetine. -Check free water clearance and urine sodium -Appears dry, will administer fluids now, but if inappropriately retaining water, will fluid restrict  3. Abnormal K:  Initial  K 5.5, repeat within a few hours <3.5. -Repeat BMP today  4. Anemia:  Normocytic, stable from previous.  Iron replete at last admission.  Folate low. -Continue folate   5. HTN:  -Continue home valsartan-HCTZ  6. Smoking:  Encouraged cessation. -Nicotine patch  7. Chronic pain:  -Continue home Suboxone, Lyrica and fluoxetine     DVT PPx: Lovenox Diet: Regular Consultants: Pulmonology Code Status: FULL Family Communication: None present  Medical decision making: What exists of the patient's previous chart was reviewed in depth and the case was discussed with Dr. Betsey Holiday. Patient seen 3:04 AM on 06/02/2015.  Disposition Plan:  I recommend admission to med surg, inpatient status.  Clinical condition: stable.  Anticipate supportive care, steroids and consultation with Pulmonology. Disposition pending speciality  evaluation.      Edwin Dada [facility] Pager [phone]

## 2015-06-02 NOTE — Progress Notes (Signed)
   Follow Up Note  HPI: 55 year old female admitted a month ago for acute hypoxic respiratory failure felt to be secondary possibly to interstitial pneumonitis who responded well to steroids and antibiotics and discharged home. Noted to have some mild hyponatremia as low as 127 attributed to dehydration and HCTZ which got better with fluids. Patient initially felt better, but then started extrinsic dyspnea on exertion and came into the emergency room for further evaluation and their sodium noted to be 121 and chest x-ray noted cardiomegaly and mild interstitial edema. Patient given one dose of Lasix. Pt admitted earlier this morning.  Seen after arrived to floor.  She's currently from better although not it back at baseline.  Exam: CV: Regular rate and rhythm Lungs: Decreased breath sounds throughout Abd: Soft, nontender, nondistended, positive bowel sounds Ext: Trace pitting edema  Present on Admission:  . Dyspnea: Patient does not have respiratory failure, oxygenation looks good on room air from ABG. May be more symptomatic from hyponatremia. Awaiting echocardiogram given cardiomegaly  . Tobacco abuse: Counseled to quit  . Hyponatremia: Unclear etiology. Slightly trended downward since admission. Have stopped HCTZ and rechecking labs  . Hypertension . Chronic low back pain Obesity  Disposition: Anticipate discharge for/12

## 2015-06-02 NOTE — ED Notes (Signed)
Pt Spo2 while sitting on bed 95%. While ambulating pt's Spo2 dropped to 93%. Pt then requested to go to the restroom. When ambulating to restroom, pt lost balance and stated she was feeling "dizzy" once back in room pt was hooked back onto Sp02 which at this time gave a reading of 89%. RN and MD notified.

## 2015-06-03 ENCOUNTER — Other Ambulatory Visit (HOSPITAL_COMMUNITY): Payer: Medicare Other

## 2015-06-03 DIAGNOSIS — M545 Low back pain: Secondary | ICD-10-CM | POA: Diagnosis present

## 2015-06-03 DIAGNOSIS — F419 Anxiety disorder, unspecified: Secondary | ICD-10-CM | POA: Diagnosis present

## 2015-06-03 DIAGNOSIS — F3160 Bipolar disorder, current episode mixed, unspecified: Secondary | ICD-10-CM | POA: Diagnosis present

## 2015-06-03 DIAGNOSIS — G8929 Other chronic pain: Secondary | ICD-10-CM | POA: Diagnosis present

## 2015-06-03 DIAGNOSIS — E875 Hyperkalemia: Secondary | ICD-10-CM | POA: Diagnosis present

## 2015-06-03 DIAGNOSIS — E871 Hypo-osmolality and hyponatremia: Secondary | ICD-10-CM | POA: Diagnosis not present

## 2015-06-03 DIAGNOSIS — E860 Dehydration: Secondary | ICD-10-CM | POA: Diagnosis present

## 2015-06-03 DIAGNOSIS — K219 Gastro-esophageal reflux disease without esophagitis: Secondary | ICD-10-CM | POA: Diagnosis present

## 2015-06-03 DIAGNOSIS — I10 Essential (primary) hypertension: Secondary | ICD-10-CM | POA: Diagnosis present

## 2015-06-03 DIAGNOSIS — M797 Fibromyalgia: Secondary | ICD-10-CM | POA: Diagnosis present

## 2015-06-03 DIAGNOSIS — F3162 Bipolar disorder, current episode mixed, moderate: Secondary | ICD-10-CM | POA: Diagnosis present

## 2015-06-03 DIAGNOSIS — Z981 Arthrodesis status: Secondary | ICD-10-CM | POA: Diagnosis not present

## 2015-06-03 DIAGNOSIS — F17210 Nicotine dependence, cigarettes, uncomplicated: Secondary | ICD-10-CM | POA: Diagnosis present

## 2015-06-03 LAB — BASIC METABOLIC PANEL
Anion gap: 11 (ref 5–15)
Anion gap: 11 (ref 5–15)
BUN: 10 mg/dL (ref 6–20)
BUN: 13 mg/dL (ref 6–20)
CO2: 20 mmol/L — ABNORMAL LOW (ref 22–32)
CO2: 21 mmol/L — ABNORMAL LOW (ref 22–32)
Calcium: 8.5 mg/dL — ABNORMAL LOW (ref 8.9–10.3)
Calcium: 9 mg/dL (ref 8.9–10.3)
Chloride: 89 mmol/L — ABNORMAL LOW (ref 101–111)
Chloride: 94 mmol/L — ABNORMAL LOW (ref 101–111)
Creatinine, Ser: 0.69 mg/dL (ref 0.44–1.00)
Creatinine, Ser: 0.69 mg/dL (ref 0.44–1.00)
GFR calc Af Amer: >60 mL/min (ref >60)
GFR calc Af Amer: >60 mL/min (ref >60)
GFR calc non Af Amer: >60 mL/min (ref >60)
GFR calc non Af Amer: >60 mL/min (ref >60)
Glucose, Bld: 94 mg/dL (ref 65–99)
Glucose, Bld: 120 mg/dL — ABNORMAL HIGH (ref 65–99)
Potassium: 5.4 mmol/L — ABNORMAL HIGH (ref 3.5–5.1)
Potassium: 5.8 mmol/L — ABNORMAL HIGH (ref 3.5–5.1)
Sodium: 120 mmol/L — ABNORMAL LOW (ref 135–145)
Sodium: 126 mmol/L — ABNORMAL LOW (ref 135–145)

## 2015-06-03 LAB — OSMOLALITY, URINE: Osmolality, Ur: 196 mOsm/kg — ABNORMAL LOW (ref 300–900)

## 2015-06-03 LAB — SODIUM, URINE, RANDOM: Sodium, Ur: 49 mmol/L

## 2015-06-03 MED ORDER — SODIUM POLYSTYRENE SULFONATE 15 GM/60ML PO SUSP
15.0000 g | Freq: Once | ORAL | Status: AC
  Administered 2015-06-03: 15 g via ORAL
  Filled 2015-06-03: qty 60

## 2015-06-03 MED ORDER — SODIUM CHLORIDE 1 G PO TABS
1.0000 g | ORAL_TABLET | Freq: Two times a day (BID) | ORAL | Status: DC
  Administered 2015-06-03 – 2015-06-04 (×3): 1 g via ORAL
  Filled 2015-06-03 (×4): qty 1

## 2015-06-03 MED ORDER — FLUOXETINE HCL 20 MG PO CAPS
20.0000 mg | ORAL_CAPSULE | Freq: Every day | ORAL | Status: DC
  Administered 2015-06-03 – 2015-06-04 (×2): 20 mg via ORAL
  Filled 2015-06-03 (×2): qty 1

## 2015-06-03 NOTE — Care Management Obs Status (Signed)
[facility] NOTIFICATION   Patient Details  Name: Madeline Dickerson MRN: [id number] Date of Birth: February 27, 1960   Medicare Observation Status Notification Given:  Yes    Royston Bake, RN 06/03/2015, 1:42 PM

## 2015-06-03 NOTE — Consult Note (Signed)
   [facility] CM Inpatient Consult   06/03/2015  Madeline Dickerson [birth year] [id number] Patient screened for potential [place] Management services under the [place].  Patient is a re-admission in the past 30 days.  Currently admitted with asthma exacerbation with a HX of CAP twice this year. Met with the patient at bedside.  Patient states she feels a lot better during this hospitalization and denies any community care management needs.  She states she is not having problems with following up with her primary care provider and she endorses that Dr. Lucianne Lei is her primary.  She denies any medication issues.   Patient is eligible for [facility] Management services under patient's Medicare  plan.  Patient states she will take a brochure with contact information.  For questions, please contact:  Natividad Brood, RN BSN [facility] Liaison  [phone] business mobile phone Toll free office [phone]

## 2015-06-03 NOTE — Progress Notes (Signed)
PROGRESS NOTE    [name]  [id number] DOB: 30-Mar-1960 DOA: 06/01/2015 PCP: Elyn Peers, MD  Outpatient Specialists: Spine surgery-Dr. Cyndy Freeze in remote past    Brief Narrative:  55 y/o ? S/p  L spine fusion Surgery 3/13-prior spinal cord stimulator-laminectomy T10 to accomplish this Bipolar Reflux HTN Tobacco abuse  Recently admitted 05/04/2015 with acute respiratory failure-pneumonitis? Severe sepsis with community-acquired pneumonia  Return to hospital with dyspnea wheezing cough and found to have the following  Na 121, K 5.5, Cr 0.7, troponin negative, BNP 62, WBC 8.8K, Hgb 11.2 (up from last discharge). Chest x-ray showed bibasilar opacities with no focal consolidation. ECG showed no ischemia. She was given continuous hour-long albuterol nebulizer, which only somewhat improved her symptoms,   Assessment & Plan:   Principal Problem:   Hypervolemic Hyponatremia, severe  Urine osmolality is low, urine sodium is 49  Resolving slowly  tells me she drinks about 55 oz fluid /d  Liberalize salt in diet  Fluid restrict 1200 cc  IV saline 75 cc per hour until/13/17  Repeat labs a.m.  salt tablets 1 g twice a day with meals  ? DC/13/17 if sodium above 1:30 Active Problems:   Hypertension -Recommend calcium channel blocker initiation as outpatient Hyperkalemia -give 1 dose of Kayexalate as potassium is 5.8 -Repeat labs a.m.   Chronic low back pain Continue Subutex 8 mg sublingually daily Needs follow-up with her regular pain physician   Tobacco abuse   Dyspnea   Bipolar 1 disorder, mixed, moderate (HCC) Continue Prozac at a lower dose of 20 (some component of SSRIs causing hyponatremia) Continue lorazepam 1 mg every 6 when necessary anxiety      DVT prophylaxis: Lovenox Code Status: Full Family Communication: (Specify name, relationship & date discussed. NO "discussed with patient") Disposition Plan: unclear-will need eunatremia prior to d/c  home   Consultants:   None yet   Procedures:   Antimicrobials:  Subjective:  Just woke up from a nap Doing well otherwise No chest pain No nausea No vomiting  Objective: Filed Vitals:   06/02/15 2151 06/03/15 0538 06/03/15 1132 06/03/15 1230  BP: 103/60 110/57 121/76   Pulse: 85 79 72   Temp: 97.8 F (36.6 C) 98 F (36.7 C) 98.2 F (36.8 C)   TempSrc: Oral Oral Oral   Resp: 18 20 18    Height:      Weight:  99.292 kg (218 lb 14.4 oz)    SpO2: 100% 98% 99% 98%    Intake/Output Summary (Last 24 hours) at 06/03/15 1556 Last data filed at 06/03/15 1300  Gross per 24 hour  Intake   2050 ml  Output   3375 ml  Net  -1325 ml   Filed Weights   06/02/15 0652 06/03/15 0538  Weight: 100.517 kg (221 lb 9.6 oz) 99.292 kg (218 lb 14.4 oz)    Examination:  General exam: Appears calm and comfortable  Respiratory system: Clear to auscultation. Respiratory effort normal. Cardiovascular system: S1 & S2 heard, RRR. No JVD, murmurs, rubs, gallops or clicks.Trace  pedal edema. Gastrointestinal system: Abdomen is nondistended, soft and nontender. No organomegaly abdomen obese  Central nervous system:  slightly sleepy but becomes rapidly Alert and oriented. No focal neurological deficits. Extremities: Symmetric 5 x 5 power. Skin: No rashes, lesions or ulcers Psychiatry: Judgement and insight appear normal.Flat  affect appropriate.     Data Reviewed: I have personally reviewed following labs and imaging studies  CBC:  Recent Labs Lab 06/01/15 2013 06/02/15 [id number]  WBC 8.8 8.6  HGB 11.2* 9.4*  HCT 33.2* 28.8*  MCV 85.3 86.2  PLT 359 [id number]   Basic Metabolic Panel:  Recent Labs Lab 06/02/15 0101 06/02/15 0744 06/02/15 1537 06/03/15 0345 06/03/15 1447  NA 120* 118* 118* 120* 126*  K 3.3* 3.4* 3.9 5.4* 5.8*  CL 86* 87* 87* 89* 94*  CO2 21* 15* 18* 20* 21*  GLUCOSE 121* 251* 277* 94 120*  BUN 7 8 8 10 13   CREATININE 0.71 0.90 0.77 0.69 0.69  CALCIUM 8.4* 8.1*  8.4* 8.5* 9.0   GFR: Estimated Creatinine Clearance: 93.3 mL/min (by C-G formula based on Cr of 0.69). Liver Function Tests: No results for input(s): AST, ALT, ALKPHOS, BILITOT, PROT, ALBUMIN in the last 168 hours. No results for input(s): LIPASE, AMYLASE in the last 168 hours. No results for input(s): AMMONIA in the last 168 hours. Coagulation Profile: No results for input(s): INR, PROTIME in the last 168 hours. Cardiac Enzymes:  Recent Labs Lab 06/01/15 2350  TROPONINI <0.03   BNP (last 3 results) No results for input(s): PROBNP in the last 8760 hours. HbA1C: No results for input(s): HGBA1C in the last 72 hours. CBG: No results for input(s): GLUCAP in the last 168 hours. Lipid Profile: No results for input(s): CHOL, HDL, LDLCALC, TRIG, CHOLHDL, LDLDIRECT in the last 72 hours. Thyroid Function Tests: No results for input(s): TSH, T4TOTAL, FREET4, T3FREE, THYROIDAB in the last 72 hours. Anemia Panel: No results for input(s): VITAMINB12, FOLATE, FERRITIN, TIBC, IRON, RETICCTPCT in the last 72 hours. Urine analysis:    Component Value Date/Time   COLORURINE YELLOW 04/30/2015 2206   APPEARANCEUR CLEAR 04/30/2015 2206   LABSPEC 1.005 04/30/2015 2206   PHURINE 6.5 04/30/2015 2206   GLUCOSEU NEGATIVE 04/30/2015 2206   HGBUR NEGATIVE 04/30/2015 2206   BILIRUBINUR NEGATIVE 04/30/2015 2206   KETONESUR NEGATIVE 04/30/2015 2206   PROTEINUR NEGATIVE 04/30/2015 2206   UROBILINOGEN 0.2 04/28/2014 1635   NITRITE NEGATIVE 04/30/2015 2206   LEUKOCYTESUR NEGATIVE 04/30/2015 2206   Sepsis Labs: @LABRCNTIP (procalcitonin:4,lacticidven:4)  )No results found for this or any previous visit (from the past 240 hour(s)).       Radiology Studies: Dg Chest 2 View  06/01/2015  CLINICAL DATA:  Acute onset of shortness of breath and cough. Fatigue. Initial encounter. EXAM: CHEST  2 VIEW COMPARISON:  Chest radiograph performed 04/30/2015 FINDINGS: The lungs are well-aerated. Vascular  congestion is noted. Mild bibasilar opacities may reflect mild interstitial edema. There is no evidence of pleural effusion or pneumothorax. The heart is borderline enlarged. No acute osseous abnormalities are seen. IMPRESSION: Vascular congestion and borderline cardiomegaly. Bibasilar airspace opacities may reflect mild interstitial edema. Electronically Signed   By: Garald Balding M.D.   On: 06/01/2015 20:27        Scheduled Meds: . buprenorphine  8 mg Sublingual Daily  . enoxaparin (LOVENOX) injection  40 mg Subcutaneous Q24H  . FLUoxetine  20 mg Oral Daily  . folic acid  1 mg Oral Daily  . predniSONE  40 mg Oral Q breakfast  . pregabalin  200 mg Oral BID  . sodium chloride  1 g Oral BID WC  . sodium polystyrene  15 g Oral Once   Continuous Infusions: . sodium chloride 75 mL/hr at 06/03/15 0513     LOS: 1 day    Time spent: [place], MD [facility] ([phone]  If 7PM-7AM, please contact night-coverage [email or web address] Password [facility] 06/03/2015, 3:56 PM

## 2015-06-04 DIAGNOSIS — E871 Hypo-osmolality and hyponatremia: Principal | ICD-10-CM

## 2015-06-04 LAB — BASIC METABOLIC PANEL
Anion gap: 10 (ref 5–15)
BUN: 16 mg/dL (ref 6–20)
CO2: 22 mmol/L (ref 22–32)
Calcium: 8.8 mg/dL — ABNORMAL LOW (ref 8.9–10.3)
Chloride: 100 mmol/L — ABNORMAL LOW (ref 101–111)
Creatinine, Ser: 0.68 mg/dL (ref 0.44–1.00)
GFR calc Af Amer: >60 mL/min (ref >60)
GFR calc non Af Amer: >60 mL/min (ref >60)
Glucose, Bld: 97 mg/dL (ref 65–99)
Potassium: 4.1 mmol/L (ref 3.5–5.1)
Sodium: 132 mmol/L — ABNORMAL LOW (ref 135–145)

## 2015-06-04 MED ORDER — VALSARTAN 160 MG PO TABS: 160.0000 mg | ORAL_TABLET | Freq: Every day | ORAL | Status: DC

## 2015-06-04 MED ORDER — NICOTINE 14 MG/24HR TD PT24
14.0000 mg | MEDICATED_PATCH | Freq: Every day | TRANSDERMAL | Status: DC
  Administered 2015-06-04: 14 mg via TRANSDERMAL
  Filled 2015-06-04: qty 1

## 2015-06-04 NOTE — Progress Notes (Signed)
NT inform me that patient was smoking in the bathroom she informed the pt that that is not allowed that O2 and other gases are flammable I ask the pt pt how many pack she smokes stated 1 pk daily [facility] paged for Nicotine patch new order received will continue to monitor. Arthor Captain LPN

## 2015-06-04 NOTE — Discharge Summary (Signed)
Physician Discharge Summary  Madeline Dickerson [id number] DOB: July 31, 1960 DOA: 06/01/2015  PCP: Elyn Peers, MD  Admit date: 06/01/2015 Discharge date: 06/04/2015  Time spent: 30 minutes  Recommendations for Outpatient Follow-up:  1. Stop HCTZ 2. Liberalize salt 3. Needs smoking cessation counselling  Discharge Diagnoses:  Principal Problem:   Hyponatremia, severe Active Problems:   Hypertension   Chronic low back pain   Tobacco abuse   Tobacco abuse counseling   Dyspnea   Bipolar 1 disorder, mixed, moderate (HCC)   Discharge Condition: improved  Diet recommendation: reg  Filed Weights   06/02/15 0652 06/03/15 0538 06/04/15 0416  Weight: 100.517 kg (221 lb 9.6 oz) 99.292 kg (218 lb 14.4 oz) 96.48 kg (212 lb 11.2 oz)    History of present illness:  55 y/o ? S/p L spine fusion Surgery 3/13-prior spinal cord stimulator-laminectomy T10 to accomplish this Bipolar Reflux HTN Tobacco abuse  Recently admitted 05/04/2015 with acute respiratory failure-pneumonitis? Severe sepsis with community-acquired pneumonia  Return to hospital with dyspnea wheezing cough and found to have the following  Na 121, K 5.5, Cr 0.7, troponin negative, BNP 62, WBC 8.8K, Hgb 11.2 (up from last discharge). Chest x-ray showed bibasilar opacities with no focal consolidation. ECG showed no ischemia. She was given continuous hour-long albuterol nebulizer, which only somewhat improved her symptoms,   Hospital Course:  Hypervolemic Hyponatremia, severe  Urine osmolality is low, urine sodium is 49  tells me she drinks about 55 oz fluid /d  Liberalize salt in diet  Fluid restrict 1200 cc  IV saline 75 cc per hour until 06/04/15  Repeat labs a.m. 06/04/2015 was 132   needs Chem-7 in about one week at PCP office :  Hypertension -Recommend calcium channel blocker initiation as outpatient - continue valsartan 160 mg alone without HCTZ.  Hyperkalemia -give 1 dose of Kayexalate as potassium  is 5.8 - no further rise in potassium and patient stable for discharge-Repeat labs a.m.  Chronic low back pain Continue Subutex 8 mg sublingually daily Needs follow-up with her regular pain physician  Tobacco abuse  Dyspnea  Bipolar 1 disorder, mixed, moderate (HCC) Continue Prozac  home dose on discharge (some component of SSRIs causing hyponatremia) Continue lorazepam 1 mg every 6 when necessary anxiety  Discharge Exam: Filed Vitals:   06/03/15 2007 06/04/15 0416  BP: 123/61 141/74  Pulse: 70 64  Temp: 98.4 F (36.9 C) 97.6 F (36.4 C)  Resp: 16 16    General: eomi ncat Cardiovascular: s1 s2 no m/r/g Respiratory: clear no added sound  Discharge Instructions   Discharge Instructions    Diet - low sodium heart healthy    Complete by:  As directed      Discharge instructions    Complete by:  As directed   Looking at her medications regularly and look at the list carefully with regards to what has changed. You should get lab work done at Dr. Fransico Setters office in about a week We will schedule an appointment for you Please follow-up and please quit smoking     Increase activity slowly    Complete by:  As directed           Current Discharge Medication List    START taking these medications   Details  valsartan (DIOVAN) 160 MG tablet Take 1 tablet (160 mg total) by mouth daily. Qty: 30 tablet, Refills: 0      CONTINUE these medications which have NOT CHANGED   Details  FLUoxetine (PROZAC) 40 MG  capsule Take 1 capsule (40 mg total) by mouth daily. Qty: 30 capsule, Refills: 3    folic acid (FOLVITE) 1 MG tablet Take 1 tablet (1 mg total) by mouth daily. Qty: 30 tablet, Refills: 2    LYRICA 200 MG capsule Take 200 mg by mouth 2 (two) times daily. Refills: 2    omeprazole (PRILOSEC) 40 MG capsule Take 40 mg by mouth daily.    SUBOXONE 8-2 MG FILM Place 1 Film under the tongue 2 (two) times daily.      STOP taking these medications      valsartan-hydrochlorothiazide (DIOVAN-HCT) 160-12.5 MG per tablet        Allergies  Allergen Reactions  . Haldol [Haloperidol Lactate] Other (See Comments)    Couldn't talk, couldn't move   . Naproxen Nausea And Vomiting      The results of significant diagnostics from this hospitalization (including imaging, microbiology, ancillary and laboratory) are listed below for reference.    Significant Diagnostic Studies: Dg Chest 2 View  06/01/2015  CLINICAL DATA:  Acute onset of shortness of breath and cough. Fatigue. Initial encounter. EXAM: CHEST  2 VIEW COMPARISON:  Chest radiograph performed 04/30/2015 FINDINGS: The lungs are well-aerated. Vascular congestion is noted. Mild bibasilar opacities may reflect mild interstitial edema. There is no evidence of pleural effusion or pneumothorax. The heart is borderline enlarged. No acute osseous abnormalities are seen. IMPRESSION: Vascular congestion and borderline cardiomegaly. Bibasilar airspace opacities may reflect mild interstitial edema. Electronically Signed   By: Garald Balding M.D.   On: 06/01/2015 20:27    Microbiology: No results found for this or any previous visit (from the past 240 hour(s)).   Labs: Basic Metabolic Panel:  Recent Labs Lab 06/02/15 0744 06/02/15 1537 06/03/15 0345 06/03/15 1447 06/04/15 0545  NA 118* 118* 120* 126* 132*  K 3.4* 3.9 5.4* 5.8* 4.1  CL 87* 87* 89* 94* 100*  CO2 15* 18* 20* 21* 22  GLUCOSE 251* 277* 94 120* 97  BUN 8 8 10 13 16   CREATININE 0.90 0.77 0.69 0.69 0.68  CALCIUM 8.1* 8.4* 8.5* 9.0 8.8*   Liver Function Tests: No results for input(s): AST, ALT, ALKPHOS, BILITOT, PROT, ALBUMIN in the last 168 hours. No results for input(s): LIPASE, AMYLASE in the last 168 hours. No results for input(s): AMMONIA in the last 168 hours. CBC:  Recent Labs Lab 06/01/15 2013 06/02/15 0744  WBC 8.8 8.6  HGB 11.2* 9.4*  HCT 33.2* 28.8*  MCV 85.3 86.2  PLT 359 380   Cardiac Enzymes:  Recent  Labs Lab 06/01/15 2350  TROPONINI <0.03   BNP: BNP (last 3 results)  Recent Labs  04/30/15 2007 06/01/15 2303  BNP 240.6* 62.3    ProBNP (last 3 results) No results for input(s): PROBNP in the last 8760 hours.  CBG: No results for input(s): GLUCAP in the last 168 hours.     SignedNita Sells MD   [facility] 06/04/2015, 11:31 AM

## 2015-06-04 NOTE — Clinical Social Work Note (Signed)
Address confirmed with patient by BSW Intern. Taxi voucher provided to [employer].   Clinical Social Worker will sign off. Please consult [place] again if new need arises.  Glendon Axe, MSW, LCSWA [phone] 06/04/2015 11:45 AM

## 2015-06-30 DIAGNOSIS — M7542 Impingement syndrome of left shoulder: Secondary | ICD-10-CM | POA: Diagnosis not present

## 2015-06-30 DIAGNOSIS — I87323 Chronic venous hypertension (idiopathic) with inflammation of bilateral lower extremity: Secondary | ICD-10-CM | POA: Diagnosis not present

## 2015-06-30 DIAGNOSIS — M545 Low back pain: Secondary | ICD-10-CM | POA: Diagnosis not present

## 2015-09-25 DIAGNOSIS — M545 Low back pain: Secondary | ICD-10-CM | POA: Diagnosis not present

## 2015-09-25 DIAGNOSIS — M7541 Impingement syndrome of right shoulder: Secondary | ICD-10-CM | POA: Diagnosis not present

## 2015-10-12 DIAGNOSIS — I10 Essential (primary) hypertension: Secondary | ICD-10-CM | POA: Diagnosis not present

## 2015-10-12 DIAGNOSIS — R634 Abnormal weight loss: Secondary | ICD-10-CM | POA: Diagnosis not present

## 2015-10-12 DIAGNOSIS — F064 Anxiety disorder due to known physiological condition: Secondary | ICD-10-CM | POA: Diagnosis not present

## 2015-10-12 DIAGNOSIS — K219 Gastro-esophageal reflux disease without esophagitis: Secondary | ICD-10-CM | POA: Diagnosis not present

## 2015-10-12 DIAGNOSIS — R7309 Other abnormal glucose: Secondary | ICD-10-CM | POA: Diagnosis not present

## 2015-10-27 DIAGNOSIS — Z79891 Long term (current) use of opiate analgesic: Secondary | ICD-10-CM | POA: Diagnosis not present

## 2015-11-10 DIAGNOSIS — Z79891 Long term (current) use of opiate analgesic: Secondary | ICD-10-CM | POA: Diagnosis not present

## 2015-11-24 DIAGNOSIS — Z79891 Long term (current) use of opiate analgesic: Secondary | ICD-10-CM | POA: Diagnosis not present

## 2015-11-24 DIAGNOSIS — J441 Chronic obstructive pulmonary disease with (acute) exacerbation: Secondary | ICD-10-CM | POA: Diagnosis not present

## 2015-12-08 DIAGNOSIS — Z79891 Long term (current) use of opiate analgesic: Secondary | ICD-10-CM | POA: Diagnosis not present

## 2015-12-16 DIAGNOSIS — K219 Gastro-esophageal reflux disease without esophagitis: Secondary | ICD-10-CM | POA: Diagnosis not present

## 2015-12-16 DIAGNOSIS — R112 Nausea with vomiting, unspecified: Secondary | ICD-10-CM | POA: Diagnosis not present

## 2015-12-22 DIAGNOSIS — J441 Chronic obstructive pulmonary disease with (acute) exacerbation: Secondary | ICD-10-CM | POA: Diagnosis not present

## 2015-12-22 DIAGNOSIS — Z79891 Long term (current) use of opiate analgesic: Secondary | ICD-10-CM | POA: Diagnosis not present

## 2015-12-25 ENCOUNTER — Emergency Department (HOSPITAL_COMMUNITY): Payer: Medicare Other

## 2015-12-25 ENCOUNTER — Encounter (HOSPITAL_COMMUNITY): Payer: Self-pay

## 2015-12-25 ENCOUNTER — Inpatient Hospital Stay (HOSPITAL_COMMUNITY)
Admission: EM | Admit: 2015-12-25 | Discharge: 2015-12-28 | DRG: 189 | Disposition: A | Discharge status: Home / Self Care | Payer: Medicare Other | Attending: Internal Medicine | Admitting: Internal Medicine

## 2015-12-25 DIAGNOSIS — I119 Hypertensive heart disease without heart failure: Secondary | ICD-10-CM | POA: Diagnosis not present

## 2015-12-25 DIAGNOSIS — J441 Chronic obstructive pulmonary disease with (acute) exacerbation: Secondary | ICD-10-CM | POA: Diagnosis not present

## 2015-12-25 DIAGNOSIS — F3162 Bipolar disorder, current episode mixed, moderate: Secondary | ICD-10-CM | POA: Diagnosis present

## 2015-12-25 DIAGNOSIS — Z823 Family history of stroke: Secondary | ICD-10-CM

## 2015-12-25 DIAGNOSIS — R778 Other specified abnormalities of plasma proteins: Secondary | ICD-10-CM | POA: Diagnosis not present

## 2015-12-25 DIAGNOSIS — Z8249 Family history of ischemic heart disease and other diseases of the circulatory system: Secondary | ICD-10-CM | POA: Diagnosis not present

## 2015-12-25 DIAGNOSIS — F419 Anxiety disorder, unspecified: Secondary | ICD-10-CM | POA: Diagnosis present

## 2015-12-25 DIAGNOSIS — M797 Fibromyalgia: Secondary | ICD-10-CM | POA: Diagnosis present

## 2015-12-25 DIAGNOSIS — Z720 Tobacco use: Secondary | ICD-10-CM | POA: Diagnosis not present

## 2015-12-25 DIAGNOSIS — E669 Obesity, unspecified: Secondary | ICD-10-CM | POA: Diagnosis present

## 2015-12-25 DIAGNOSIS — J9601 Acute respiratory failure with hypoxia: Principal | ICD-10-CM | POA: Diagnosis present

## 2015-12-25 DIAGNOSIS — E875 Hyperkalemia: Secondary | ICD-10-CM | POA: Diagnosis present

## 2015-12-25 DIAGNOSIS — F1120 Opioid dependence, uncomplicated: Secondary | ICD-10-CM | POA: Diagnosis present

## 2015-12-25 DIAGNOSIS — K219 Gastro-esophageal reflux disease without esophagitis: Secondary | ICD-10-CM | POA: Diagnosis present

## 2015-12-25 DIAGNOSIS — E871 Hypo-osmolality and hyponatremia: Secondary | ICD-10-CM | POA: Diagnosis present

## 2015-12-25 DIAGNOSIS — Z6831 Body mass index (BMI) 31.0-31.9, adult: Secondary | ICD-10-CM | POA: Diagnosis not present

## 2015-12-25 DIAGNOSIS — D72828 Other elevated white blood cell count: Secondary | ICD-10-CM | POA: Diagnosis not present

## 2015-12-25 DIAGNOSIS — Z79899 Other long term (current) drug therapy: Secondary | ICD-10-CM

## 2015-12-25 DIAGNOSIS — I10 Essential (primary) hypertension: Secondary | ICD-10-CM | POA: Diagnosis not present

## 2015-12-25 DIAGNOSIS — D500 Iron deficiency anemia secondary to blood loss (chronic): Secondary | ICD-10-CM | POA: Diagnosis not present

## 2015-12-25 DIAGNOSIS — I517 Cardiomegaly: Secondary | ICD-10-CM | POA: Diagnosis present

## 2015-12-25 DIAGNOSIS — J9811 Atelectasis: Secondary | ICD-10-CM | POA: Diagnosis not present

## 2015-12-25 DIAGNOSIS — Z801 Family history of malignant neoplasm of trachea, bronchus and lung: Secondary | ICD-10-CM

## 2015-12-25 DIAGNOSIS — R0600 Dyspnea, unspecified: Secondary | ICD-10-CM | POA: Diagnosis not present

## 2015-12-25 DIAGNOSIS — R55 Syncope and collapse: Secondary | ICD-10-CM | POA: Diagnosis not present

## 2015-12-25 DIAGNOSIS — R0602 Shortness of breath: Secondary | ICD-10-CM

## 2015-12-25 DIAGNOSIS — I2721 Secondary pulmonary arterial hypertension: Secondary | ICD-10-CM | POA: Diagnosis present

## 2015-12-25 DIAGNOSIS — Z981 Arthrodesis status: Secondary | ICD-10-CM | POA: Diagnosis not present

## 2015-12-25 DIAGNOSIS — G8929 Other chronic pain: Secondary | ICD-10-CM | POA: Diagnosis not present

## 2015-12-25 DIAGNOSIS — F17210 Nicotine dependence, cigarettes, uncomplicated: Secondary | ICD-10-CM | POA: Diagnosis present

## 2015-12-25 DIAGNOSIS — D649 Anemia, unspecified: Secondary | ICD-10-CM | POA: Diagnosis present

## 2015-12-25 LAB — BASIC METABOLIC PANEL
Anion gap: 12 (ref 5–15)
BUN: 16 mg/dL (ref 6–20)
CO2: 20 mmol/L — ABNORMAL LOW (ref 22–32)
Calcium: 8.5 mg/dL — ABNORMAL LOW (ref 8.9–10.3)
Chloride: 93 mmol/L — ABNORMAL LOW (ref 101–111)
Creatinine, Ser: 0.8 mg/dL (ref 0.44–1.00)
GFR calc Af Amer: >60 mL/min (ref >60)
GFR calc non Af Amer: >60 mL/min (ref >60)
Glucose, Bld: 101 mg/dL — ABNORMAL HIGH (ref 65–99)
Potassium: 4.6 mmol/L (ref 3.5–5.1)
Sodium: 125 mmol/L — ABNORMAL LOW (ref 135–145)

## 2015-12-25 LAB — URINALYSIS, ROUTINE W REFLEX MICROSCOPIC
Bilirubin Urine: NEGATIVE
Glucose, UA: NEGATIVE mg/dL
Hgb urine dipstick: NEGATIVE
Ketones, ur: NEGATIVE mg/dL
Leukocytes, UA: NEGATIVE
Nitrite: NEGATIVE
Protein, ur: NEGATIVE mg/dL
Specific Gravity, Urine: 1.007 (ref 1.005–1.030)
pH: 6 (ref 5.0–8.0)

## 2015-12-25 LAB — CBC WITH DIFFERENTIAL/PLATELET
Basophils Absolute: 0 10*3/uL (ref 0.0–0.1)
Basophils Relative: 0 %
Eosinophils Absolute: 0.3 10*3/uL (ref 0.0–0.7)
Eosinophils Relative: 2 %
HCT: 29.2 % — ABNORMAL LOW (ref 36.0–46.0)
Hemoglobin: 10.2 g/dL — ABNORMAL LOW (ref 12.0–15.0)
Lymphocytes Relative: 8 %
Lymphs Abs: 1.1 10*3/uL (ref 0.7–4.0)
MCH: 29.5 pg (ref 26.0–34.0)
MCHC: 34.9 g/dL (ref 30.0–36.0)
MCV: 84.4 fL (ref 78.0–100.0)
Monocytes Absolute: 0.5 10*3/uL (ref 0.1–1.0)
Monocytes Relative: 4 %
Neutro Abs: 11.7 10*3/uL — ABNORMAL HIGH (ref 1.7–7.7)
Neutrophils Relative %: 86 %
Platelets: 457 10*3/uL — ABNORMAL HIGH (ref 150–400)
RBC: 3.46 MIL/uL — ABNORMAL LOW (ref 3.87–5.11)
RDW: 16.3 % — ABNORMAL HIGH (ref 11.5–15.5)
WBC: 13.6 10*3/uL — ABNORMAL HIGH (ref 4.0–10.5)

## 2015-12-25 LAB — RAPID URINE DRUG SCREEN, HOSP PERFORMED
Amphetamines: NOT DETECTED
Barbiturates: NOT DETECTED
Benzodiazepines: NOT DETECTED
Cocaine: NOT DETECTED
Opiates: NOT DETECTED
Tetrahydrocannabinol: POSITIVE — AB

## 2015-12-25 LAB — BRAIN NATRIURETIC PEPTIDE: B Natriuretic Peptide: 518.8 pg/mL — ABNORMAL HIGH (ref 0.0–100.0)

## 2015-12-25 LAB — SODIUM, URINE, RANDOM: Sodium, Ur: 20 mmol/L

## 2015-12-25 LAB — TSH: TSH: 3.591 u[IU]/mL (ref 0.350–4.500)

## 2015-12-25 LAB — D-DIMER, QUANTITATIVE: D-Dimer, Quant: 3.06 ug/mL-FEU — ABNORMAL HIGH (ref 0.00–0.50)

## 2015-12-25 LAB — BLOOD GAS, VENOUS
Acid-base deficit: 2.2 mmol/L — ABNORMAL HIGH (ref 0.0–2.0)
Bicarbonate: 21.9 mmol/L (ref 20.0–28.0)
O2 Saturation: 59.5 %
Patient temperature: 98.6
pCO2, Ven: 36.9 mmHg — ABNORMAL LOW (ref 44.0–60.0)
pH, Ven: 7.39 (ref 7.250–7.430)
pO2, Ven: 35 mmHg (ref 32.0–45.0)

## 2015-12-25 LAB — PROCALCITONIN: Procalcitonin: 0.1 ng/mL

## 2015-12-25 LAB — OSMOLALITY, URINE: Osmolality, Ur: 136 mOsm/kg — ABNORMAL LOW (ref 300–900)

## 2015-12-25 LAB — CREATININE, URINE, RANDOM: Creatinine, Urine: 19.2 mg/dL

## 2015-12-25 MED ORDER — ENOXAPARIN SODIUM 40 MG/0.4ML ~~LOC~~ SOLN
40.0000 mg | SUBCUTANEOUS | Status: DC
  Administered 2015-12-25 – 2015-12-27 (×3): 40 mg via SUBCUTANEOUS
  Filled 2015-12-25 (×3): qty 0.4

## 2015-12-25 MED ORDER — SODIUM CHLORIDE 0.9% FLUSH: 3.0000 mL | INTRAVENOUS | Status: DC | PRN

## 2015-12-25 MED ORDER — ONDANSETRON HCL 4 MG PO TABS
4.0000 mg | ORAL_TABLET | Freq: Four times a day (QID) | ORAL | Status: DC | PRN
  Administered 2015-12-26: 4 mg via ORAL
  Filled 2015-12-25: qty 1

## 2015-12-25 MED ORDER — ACETAMINOPHEN 650 MG RE SUPP: 650.0000 mg | Freq: Four times a day (QID) | RECTAL | Status: DC | PRN

## 2015-12-25 MED ORDER — FUROSEMIDE 10 MG/ML IJ SOLN
20.0000 mg | Freq: Once | INTRAMUSCULAR | Status: AC
Start: 2015-12-25 — End: 2015-12-25
  Administered 2015-12-25: 20 mg via INTRAVENOUS
  Filled 2015-12-25: qty 4

## 2015-12-25 MED ORDER — METHYLPREDNISOLONE SODIUM SUCC 125 MG IJ SOLR
60.0000 mg | Freq: Two times a day (BID) | INTRAMUSCULAR | Status: DC
  Administered 2015-12-25 – 2015-12-26 (×3): 60 mg via INTRAVENOUS
  Filled 2015-12-25 (×3): qty 2

## 2015-12-25 MED ORDER — IPRATROPIUM-ALBUTEROL 0.5-2.5 (3) MG/3ML IN SOLN
3.0000 mL | Freq: Four times a day (QID) | RESPIRATORY_TRACT | Status: DC
  Administered 2015-12-25 – 2015-12-26 (×5): 3 mL via RESPIRATORY_TRACT
  Filled 2015-12-25 (×5): qty 3

## 2015-12-25 MED ORDER — FLUOXETINE HCL 20 MG PO CAPS
40.0000 mg | ORAL_CAPSULE | Freq: Every day | ORAL | Status: DC
  Administered 2015-12-26 – 2015-12-28 (×3): 40 mg via ORAL
  Filled 2015-12-25 (×3): qty 2

## 2015-12-25 MED ORDER — IPRATROPIUM BROMIDE 0.02 % IN SOLN
1.0000 mg | Freq: Once | RESPIRATORY_TRACT | Status: AC
  Administered 2015-12-25: 1 mg via RESPIRATORY_TRACT

## 2015-12-25 MED ORDER — AZITHROMYCIN 500 MG IV SOLR
500.0000 mg | INTRAVENOUS | Status: DC
  Administered 2015-12-25 – 2015-12-27 (×3): 500 mg via INTRAVENOUS
  Filled 2015-12-25 (×3): qty 500

## 2015-12-25 MED ORDER — NICOTINE 21 MG/24HR TD PT24
21.0000 mg | MEDICATED_PATCH | Freq: Every day | TRANSDERMAL | Status: DC
  Administered 2015-12-25 – 2015-12-28 (×4): 21 mg via TRANSDERMAL
  Filled 2015-12-25 (×4): qty 1

## 2015-12-25 MED ORDER — SODIUM CHLORIDE 0.9% FLUSH
3.0000 mL | Freq: Two times a day (BID) | INTRAVENOUS | Status: DC
  Administered 2015-12-26 – 2015-12-27 (×3): 3 mL via INTRAVENOUS

## 2015-12-25 MED ORDER — PANTOPRAZOLE SODIUM 40 MG PO TBEC
40.0000 mg | DELAYED_RELEASE_TABLET | Freq: Every day | ORAL | Status: DC
  Administered 2015-12-26 – 2015-12-28 (×3): 40 mg via ORAL
  Filled 2015-12-25 (×3): qty 1

## 2015-12-25 MED ORDER — SODIUM CHLORIDE 0.9 % IV SOLN
250.0000 mL | INTRAVENOUS | Status: DC | PRN
  Administered 2015-12-25: 250 mL via INTRAVENOUS

## 2015-12-25 MED ORDER — PREGABALIN 75 MG PO CAPS
300.0000 mg | ORAL_CAPSULE | Freq: Two times a day (BID) | ORAL | Status: DC
  Administered 2015-12-25 – 2015-12-28 (×6): 300 mg via ORAL
  Filled 2015-12-25 (×6): qty 4

## 2015-12-25 MED ORDER — ALBUTEROL (5 MG/ML) CONTINUOUS INHALATION SOLN
10.0000 mg/h | INHALATION_SOLUTION | RESPIRATORY_TRACT | Status: DC
  Administered 2015-12-25: 10 mg/h via RESPIRATORY_TRACT

## 2015-12-25 MED ORDER — HYDROCODONE-ACETAMINOPHEN 5-325 MG PO TABS: 1.0000 | ORAL_TABLET | ORAL | Status: DC | PRN

## 2015-12-25 MED ORDER — ONDANSETRON HCL 4 MG/2ML IJ SOLN: 4.0000 mg | Freq: Four times a day (QID) | INTRAMUSCULAR | Status: DC | PRN

## 2015-12-25 MED ORDER — IBUPROFEN 200 MG PO TABS
400.0000 mg | ORAL_TABLET | Freq: Once | ORAL | Status: AC
  Administered 2015-12-25: 400 mg via ORAL
  Filled 2015-12-25: qty 2

## 2015-12-25 MED ORDER — DIAZEPAM 5 MG PO TABS
10.0000 mg | ORAL_TABLET | Freq: Two times a day (BID) | ORAL | Status: DC | PRN
  Administered 2015-12-26 – 2015-12-27 (×4): 10 mg via ORAL
  Filled 2015-12-25 (×4): qty 2

## 2015-12-25 MED ORDER — ACETAMINOPHEN 325 MG PO TABS: 650.0000 mg | ORAL_TABLET | Freq: Four times a day (QID) | ORAL | Status: DC | PRN

## 2015-12-25 MED ORDER — DIAZEPAM 5 MG PO TABS
5.0000 mg | ORAL_TABLET | Freq: Once | ORAL | Status: AC
  Administered 2015-12-25: 5 mg via ORAL
  Filled 2015-12-25: qty 1

## 2015-12-25 MED ORDER — ALBUTEROL SULFATE (2.5 MG/3ML) 0.083% IN NEBU
5.0000 mg | INHALATION_SOLUTION | Freq: Once | RESPIRATORY_TRACT | Status: AC
  Administered 2015-12-25: 5 mg via RESPIRATORY_TRACT
  Filled 2015-12-25: qty 6

## 2015-12-25 MED ORDER — FUROSEMIDE 10 MG/ML IJ SOLN
60.0000 mg | Freq: Once | INTRAMUSCULAR | Status: DC
Start: 2015-12-25 — End: 2015-12-25

## 2015-12-25 MED ORDER — DEXTROSE 5 % IV SOLN
1.0000 g | INTRAVENOUS | Status: DC
  Administered 2015-12-25 – 2015-12-27 (×3): 1 g via INTRAVENOUS
  Filled 2015-12-25 (×3): qty 10

## 2015-12-25 MED ORDER — LEVALBUTEROL HCL 0.63 MG/3ML IN NEBU: 0.6300 mg | INHALATION_SOLUTION | RESPIRATORY_TRACT | Status: DC | PRN

## 2015-12-25 MED ORDER — ALBUTEROL SULFATE (2.5 MG/3ML) 0.083% IN NEBU
INHALATION_SOLUTION | RESPIRATORY_TRACT | Status: AC
  Filled 2015-12-25: qty 12

## 2015-12-25 MED ORDER — GUAIFENESIN ER 600 MG PO TB12
600.0000 mg | ORAL_TABLET | Freq: Two times a day (BID) | ORAL | Status: DC
  Administered 2015-12-25 – 2015-12-28 (×6): 600 mg via ORAL
  Filled 2015-12-25 (×6): qty 1

## 2015-12-25 MED ORDER — BUPRENORPHINE HCL 2 MG SL SUBL
2.0000 mg | SUBLINGUAL_TABLET | Freq: Every day | SUBLINGUAL | Status: DC
  Administered 2015-12-26 – 2015-12-28 (×3): 2 mg via SUBLINGUAL
  Filled 2015-12-25 (×3): qty 1

## 2015-12-25 NOTE — ED Notes (Signed)
Bed: WA06 Expected date:  Expected time:  Means of arrival:  Comments: Ems 55 yo sob

## 2015-12-25 NOTE — H&P (Signed)
Madeline Dickerson [id number] DOB: 1960/11/09 DOA: 12/25/2015     PCP: Elyn Peers, MD   Outpatient Specialists: none  Patient coming from:   home Lives alone,        Chief Complaint: Shortness of breath  HPI: Madeline Dickerson is a 55 y.o. female with medical history significant of chronic pain, hyponatremia, HTN, tobacco abuse, bipolar disorder, prior admissions for acute respiratory failure with  Hypoxia, history of seasonal asthma    Presented with two-week history of gradually worsening shortness of breath. She reports she has recently been treated with Z-Pak and Symbicort inhaler and gave her steroid shot in the office but no prescribtion.  prior to this 2 weeks ago she required also visit to  PCP for similar symptoms. She endorses wheezing. No fevers or chills. Her symptoms continued to worsen despite appropriate treatment he's been having more shortness of breath with exertion. And now unable to walk across her home without having to take a break. She's been having cough productive of minimal sputum.   She has been taking tylenol on the regular bases. She has been taking ibuprofen on the regular bases. Today she called 911 on the arrival patient was satting 76% on the ear was significantly short of breath. Patient not on oxygen at home. She has been endorsing bronchitis like symptoms for the past 1 week. EMS administered 10 mg of albuterol and 0.5 Atrovent 125 of Solu-Medrol started patient on oxygen with improved oxygen saturation up to 99%. Patient have had some improvement but still continues to have difficulty breathing. An emergency department she received continuous nebulizer treatment. She denies history of COPD but she does smoke and has been smoking for the past 40 years. No associated chest pain no recent travel. She reports minimal leg edema no travel. Reports she drinks plenty of sweet tea and diet pepsi. Reports chest soreness worse with deep breaths.    Regarding pertinent  Chronic problems: In April this year patient was admitted for dyspnea prior to this she has been admitted in March 2017 for acute respiratory failure was found diverticula interstitial opacities diagnosis of pneumonitis treated with steroids and antibiotics during this admission she was also noted to be hyponatremic as well as 121. On entry mellitus Fluck to be secondary to hydrochlorothiazide which has been discontinued IN ER:  Temp (24hrs), Avg:98.3 F (36.8 C), Min:98.3 F (36.8 C), Max:98.3 F (36.8 C)     RR 20 heart rate 109 oxygen saturation 90% on 2 L BP 134/85 VBG 7.39/36.9 WBC 13.6 hemoglobin 10.2 Sodium 125 of note patient has chronic hyponatremia previously as well as 118 in April BNP 518 up from baseline Portable x-ray showing increased interstitial prominence Following Medications were ordered in ER: Medications  albuterol (PROVENTIL,VENTOLIN) solution continuous neb (0 mg/hr Nebulization Stopped 12/25/15 1700)  albuterol (PROVENTIL) (2.5 MG/3ML) 0.083% nebulizer solution (  Not Given 12/25/15 1551)  albuterol (PROVENTIL) (2.5 MG/3ML) 0.083% nebulizer solution 5 mg (5 mg Nebulization Given 12/25/15 1531)  ipratropium (ATROVENT) nebulizer solution 1 mg (1 mg Nebulization Given 12/25/15 1554)  furosemide (LASIX) injection 20 mg (20 mg Intravenous Given 12/25/15 1815)      Hospitalist was called for admission for Acute respiratory failure with hypoxia secondary to possible  COPD exacerbation versus interstitial lung disease  Review of Systems:    Pertinent positives include: fatigue, shortness of breath at rest. dyspnea on exertion, productive cough, wheezing.  Constitutional:  No weight loss, night sweats, Fevers, chills,  weight loss  HEENT:  No headaches, Difficulty swallowing,Tooth/dental problems,Sore throat,  No sneezing, itching, ear ache, nasal congestion, post nasal drip,  Cardio-vascular:  No chest pain, Orthopnea, PND, anasarca, dizziness, palpitations.no Bilateral  lower extremity swelling  GI:  No heartburn, indigestion, abdominal pain, nausea, vomiting, diarrhea, change in bowel habits, loss of appetite, melena, blood in stool, hematemesis Resp:   , no  No non-productive cough, No coughing up of blood.No change in color of mucus.  Skin:  no rash or lesions. No jaundice GU:  no dysuria, change in color of urine, no urgency or frequency. No straining to urinate.  No flank pain.  Musculoskeletal:  No joint pain or no joint swelling. No decreased range of motion. No back pain.  Psych:  No change in mood or affect. No depression or anxiety. No memory loss.  Neuro: no localizing neurological complaints, no tingling, no weakness, no double vision, no gait abnormality, no slurred speech, no confusion  As per HPI otherwise 10 point review of systems negative.   Past Medical History: Past Medical History:  Diagnosis Date  . Anxiety    hx  . Arthritis    "lower back, hands" (05/01/2015)  . Chronic low back pain    disk s/p 4 diskectomies, 5th fursion, then spinal cord stimulator ([place])  . Depression   . Fibromyalgia   . GERD (gastroesophageal reflux disease)   . Hypertension   . Pneumonia 05/01/2015  . Rotator cuff disorder   . Seasonal asthma   . Shortness of breath dyspnea   . Walking pneumonia    Past Surgical History:  Procedure Laterality Date  . BACK SURGERY    . CESAREAN SECTION  1986  . LUMBAR MICRODISCECTOMY  01/1990; 2003  . LUMBAR SPINE SURGERY  03/1991   "cleaned up scar tissue"  . POSTERIOR LUMBAR FUSION  2011; 04/27/2011  . REPAIR DURAL / CSF LEAK  02/1990  . SPINAL CORD STIMULATOR IMPLANT  2012  . SPINAL CORD STIMULATOR REMOVAL  08/2010  . TUBAL LIGATION  1986     Social History:  Ambulatory   independently      reports that she has been smoking Cigarettes.  She has a 42.00 pack-year smoking history. She has never used smokeless tobacco. She reports that she uses drugs. She reports that she does not drink  alcohol.  Allergies:   Allergies  Allergen Reactions  . Haldol [Haloperidol Lactate] Other (See Comments)    Couldn't talk, couldn't move   . Naproxen Nausea And Vomiting       Family History:   Family History  Problem Relation Age of Onset  . Stroke Father   . Hypertension Father   . Lung cancer Father     Medications: Prior to Admission medications   Medication Sig Start Date End Date Taking? Authorizing Provider  azithromycin (ZITHROMAX) 250 MG tablet Take 250-500 mg by mouth daily. Take 500mg  on day 1 and then 250mg  for 5 days 12/22/15  Yes Historical Provider, MD  diazepam (VALIUM) 10 MG tablet Take 10 mg by mouth 2 (two) times daily as needed for anxiety. 09/25/15  Yes Historical Provider, MD  FLUoxetine (PROZAC) 40 MG capsule Take 1 capsule (40 mg total) by mouth daily. 11/05/14  Yes Kaitlyn Szekalski, PA-C  LYRICA 300 MG capsule Take 300 mg by mouth 2 (two) times daily. 12/21/15  Yes Historical Provider, MD  omeprazole (PRILOSEC) 40 MG capsule Take 40 mg by mouth daily.   Yes Historical Provider, MD  promethazine (PHENERGAN) 25 MG  tablet Take 25 mg by mouth every 6 (six) hours as needed for nausea/vomiting. 12/16/15  Yes Historical Provider, MD  SUBOXONE 8-2 MG FILM Place 1 Film under the tongue 2 (two) times daily. 04/30/15  Yes Historical Provider, MD  valsartan-hydrochlorothiazide (DIOVAN-HCT) 160-12.5 MG tablet Take 1 tablet by mouth daily.  12/12/15  Yes Historical Provider, MD  folic acid (FOLVITE) 1 MG tablet Take 1 tablet (1 mg total) by mouth daily. Patient not taking: Reported on 12/25/2015 05/04/15   Bonnielee Haff, MD  valsartan (DIOVAN) 160 MG tablet Take 1 tablet (160 mg total) by mouth daily. Patient not taking: Reported on 12/25/2015 06/04/15   Nita Sells, MD    Physical Exam: Patient Vitals for the past 24 hrs:  BP Temp Temp src Pulse Resp SpO2  12/25/15 1731 134/85 - - 109 20 90 %  12/25/15 1554 - - - 100 21 92 %  12/25/15 1531 - - - 97 22 93 %   12/25/15 1504 131/73 98.3 F (36.8 C) Oral 101 22 97 %    1. General:  in No Acute distress 2. Psychological: Alert and   Oriented 3. Head/ENT:     Dry Mucous Membranes                          Head Non traumatic, neck supple                           Poor Dentition 4. SKIN:  decreased Skin turgor,  Skin clean Dry and intact no rash 5. Heart: Regular rate and rhythm no  Murmur, Rub or gallop 6. Lungs:some wheezes  and  crackles   7. Abdomen: Soft,  non-tender, Non distended 8. Lower extremities: no clubbing, cyanosis, or edema 9. Neurologically Grossly intact, moving all 4 extremities equally   10. MSK: Normal range of motion   body mass index is unknown because there is no height or weight on file.  Labs on Admission:   Labs on Admission: I have personally reviewed following labs and imaging studies  CBC:  Recent Labs Lab 12/25/15 1601  WBC 13.6*  NEUTROABS 11.7*  HGB 10.2*  HCT 29.2*  MCV 84.4  PLT [id number]*   Basic Metabolic Panel:  Recent Labs Lab 12/25/15 1601  NA 125*  K 4.6  CL 93*  CO2 20*  GLUCOSE 101*  BUN 16  CREATININE 0.80  CALCIUM 8.5*   GFR: CrCl cannot be calculated (Unknown ideal weight.). Liver Function Tests: No results for input(s): AST, ALT, ALKPHOS, BILITOT, PROT, ALBUMIN in the last 168 hours. No results for input(s): LIPASE, AMYLASE in the last 168 hours. No results for input(s): AMMONIA in the last 168 hours. Coagulation Profile: No results for input(s): INR, PROTIME in the last 168 hours. Cardiac Enzymes: No results for input(s): CKTOTAL, CKMB, CKMBINDEX, TROPONINI in the last 168 hours. BNP (last 3 results) No results for input(s): PROBNP in the last 8760 hours. HbA1C: No results for input(s): HGBA1C in the last 72 hours. CBG: No results for input(s): GLUCAP in the last 168 hours. Lipid Profile: No results for input(s): CHOL, HDL, LDLCALC, TRIG, CHOLHDL, LDLDIRECT in the last 72 hours. Thyroid Function Tests: No results for  input(s): TSH, T4TOTAL, FREET4, T3FREE, THYROIDAB in the last 72 hours. Anemia Panel: No results for input(s): VITAMINB12, FOLATE, FERRITIN, TIBC, IRON, RETICCTPCT in the last 72 hours.  Sepsis Labs: @LABRCNTIP (procalcitonin:4,lacticidven:4) )No results found for this or  any previous visit (from the past 240 hour(s)).     UA  ordered  Lab Results  Component Value Date   HGBA1C 6.0 04/01/2011    CrCl cannot be calculated (Unknown ideal weight.).  BNP (last 3 results) No results for input(s): PROBNP in the last 8760 hours.   ECG REPORT  Independently reviewed Rate:98  Rhythm: Sinus rhythm ST&T Change: No acute ischemic changes  QTC 454  There were no vitals filed for this visit.   Cultures:    Component Value Date/Time   SDES BLOOD RIGHT HAND 04/30/2015 2252   SPECREQUEST BOTTLES DRAWN AEROBIC AND ANAEROBIC 5CC 04/30/2015 2252   CULT NO GROWTH 5 DAYS 04/30/2015 2252   REPTSTATUS 05/06/2015 FINAL 04/30/2015 2252     Radiological Exams on Admission: Dg Chest Port 1 View  Result Date: 12/25/2015 CLINICAL DATA:  Progressive worsening of shortness of breath over the past week. History of seasonal asthma and episodes of pneumonia. Current smoker. EXAM: PORTABLE CHEST 1 VIEW COMPARISON:  PA and lateral chest x-ray of June 01, 2015 FINDINGS: The lungs are well-expanded. The pulmonary interstitial markings are increased diffusely and overall are more conspicuous than on the previous study. The cardiac silhouette is enlarged but not greatly changed from the previous study. The pulmonary vascularity is mildly engorged and indistinct. The mediastinum is normal in width. There is no pleural effusion. The bony thorax exhibits no acute abnormality. IMPRESSION: Increased interstitial prominence bilaterally may reflect interstitial edema or interstitial pneumonia. There is no alveolar pneumonia. When the patient can tolerate the procedure, a PA and lateral chest x-ray would be useful.  Electronically Signed   By: David  Martinique M.D.   On: 12/25/2015 16:33    Chart has been reviewed    Assessment/Plan  55 y.o. female with medical history significant of chronic pain, hyponatremia, HTN, tobacco abuse, bipolar disorder, prior admissions for acute respiratory failure with  Hypoxia, history of seasonal asthmaBeing admitted for acute respiratory failure with hypoxia secondary to COPD exacerbation versus interstitial lung disease versus new diagnosis of CHF  Present on Admission:  . Hyponatremia, severe -patient has persistent hyponatremia. We'll need to evaluate for any underlying pulmonary disease. Also need to evaluate for any cardiac disease. Obtain urine electrolytes. Given questionable pulmonary edema and cardiomegaly will hold off on over aggressive fluid resuscitation for now until her further information regarding etiology. . Acute respiratory failure with hypoxia (HCC) currently on oxygen patient may need to be discharged on oxygen. Strongly encouraged to discontinue smoking. Currently patient appears to be able to speak in complete sentences. We will admit to telemetry appreciate pulmonology input. Given unclear cardiac versus pulmonate urology will cycle cardiac enzymes obtain echogram. Given atypical chest pain we'll obtain d-dimer. Patient may benefit from CT of her chest to evaluate for underlying interstitial lung disease given and repeated admissions . Tobacco abuse spoke about importance of quitting nicotine patch ordered . HypertensionHold Diovan and hydrochlorothiazide given persistent hyponatremia . COPD exacerbation [facility]) Patient 40+ smoking history with wheezing responding to nebulizer treatments, appreciate pulmonology consult ordered COPD Gold protocol. Continue Solu-Medrol and will taper as able, antibiotics for respiratory coverage, DuoNeb, the Xopenex when necessary . Anemia-Obtain anemia panel . Bipolar 1 disorder, mixed, moderate (HCC)  appears to be stable  continue home medications . Cardiomegaly - will need to have workup for possible CHF. somewhat elevated BNP could be secondary to right heart strain Will obtain echogram to father evaluate and to evaluate for any presence of pulmonary attention. Patient was  given Lasix in the emergency department she currently endorses dry mucous membranes appears to be euvolemic will hold off on father diuresis for right now until father clarified with chest x-ray abnormalities pulmonary versus cardiac in etiology   Other plan as per orders.  DVT prophylaxis:   Lovenox     Code Status:  FULL CODE  per patient    Family Communication:   Family not  at  Bedside    Disposition Plan:    To home once workup is complete and patient is stable                              Consults called: Pulmonology  Admission status:   inpatient       Level of care     tele          I have spent a total of 56 min on this admission   Urban Naval 12/25/2015, 7:51 PM    [facility]  Pager [phone]   after 2 AM please page floor coverage PA If 7AM-7PM, please contact the day team taking care of the patient  [email or web address]  Password [email or web address]

## 2015-12-25 NOTE — ED Triage Notes (Signed)
BIB EMS from Home w/ reports of 76% SPO2 on RA and SOB. Pt was dx'd w/ bronchitis x1 week ago.   EMS administered  10 mg Albuterol 0.5 Atrovent   SPO2 upon arrival 99%.   EMS Vitals   BP 130/80 HR 96  RR 16 SPO2 99%

## 2015-12-25 NOTE — ED Provider Notes (Signed)
[facility] DEPT Provider Note   CSN: [id number] Arrival date & time: 12/25/15  1458     History   Chief Complaint Chief Complaint  Patient presents with  . Shortness of Breath    HPI Madeline Dickerson is a 55 y.o. female.  The history is provided by the patient and medical records. No language interpreter was used.   Madeline Dickerson is a 55 y.o. female  with a PMH of seasonal asthma, chronic back pain, HTN, fibromyalgia who presents to the Emergency Department complaining of worsening shortness of breath for almost 2 weeks. He was seen on Wednesday 10/25 (9 days ago) and started on a z-pack and Symbicort inhaler. Despite this, symptoms have worsened. Shortness of breath is much worse with exertion and she is now unable to walk across her home without having to take a break. She endorses associated productive cough and wheezing. She was given 125 of Solu-Medrol and 10 mg of albuterol via an EMS in route. She states some mild relief, but still having difficulty breathing. She is not on oxygen at home. She denies a history of COPD, however she is a daily smoker for the last 40 years. She denies fevers, chills, new back pain, abdominal pain, chest pain.  Past Medical History:  Diagnosis Date  . Anxiety    hx  . Arthritis    "lower back, hands" (05/01/2015)  . Chronic low back pain    disk s/p 4 diskectomies, 5th fursion, then spinal cord stimulator ([place])  . Depression   . Fibromyalgia   . GERD (gastroesophageal reflux disease)   . Hypertension   . Pneumonia 05/01/2015  . Rotator cuff disorder   . Seasonal asthma   . Shortness of breath dyspnea   . Walking pneumonia     Patient Active Problem List   Diagnosis Date Noted  . COPD exacerbation ([place]) 12/25/2015  . Anemia 12/25/2015  . Shortness of breath 12/25/2015  . Bipolar 1 disorder, mixed, moderate ([place]) 06/03/2015  . CAP (community acquired pneumonia) 04/30/2015  . Pneumonitis 04/30/2015  . Status asthmaticus 04/30/2015    . Acute respiratory failure with hypoxia ([place]) 04/30/2015  . Dyspnea 04/30/2015  . Hyponatremia, severe 04/30/2015  . Lumbar spondylosis 04/27/2011  . Screening for colon cancer 01/17/2011  . Abnormal EKG 01/17/2011  . Tobacco abuse 01/17/2011  . Tobacco abuse counseling 01/17/2011  . Hypertension   . Chronic low back pain     Past Surgical History:  Procedure Laterality Date  . BACK SURGERY    . CESAREAN SECTION  1986  . LUMBAR MICRODISCECTOMY  01/1990; 2003  . LUMBAR SPINE SURGERY  03/1991   "cleaned up scar tissue"  . POSTERIOR LUMBAR FUSION  2011; 04/27/2011  . REPAIR DURAL / CSF LEAK  02/1990  . SPINAL CORD STIMULATOR IMPLANT  2012  . SPINAL CORD STIMULATOR REMOVAL  08/2010  . TUBAL LIGATION  1986    OB History    No data available       Home Medications    Prior to Admission medications   Medication Sig Start Date End Date Taking? Authorizing Provider  azithromycin (ZITHROMAX) 250 MG tablet Take 250-500 mg by mouth daily. Take 500mg  on day 1 and then 250mg  for 5 days 12/22/15  Yes Historical Provider, MD  diazepam (VALIUM) 10 MG tablet Take 10 mg by mouth 2 (two) times daily as needed for anxiety. 09/25/15  Yes Historical Provider, MD  FLUoxetine (PROZAC) 40 MG capsule Take 1 capsule (40 mg  total) by mouth daily. 11/05/14  Yes Kaitlyn Szekalski, PA-C  LYRICA 300 MG capsule Take 300 mg by mouth 2 (two) times daily. 12/21/15  Yes Historical Provider, MD  omeprazole (PRILOSEC) 40 MG capsule Take 40 mg by mouth daily.   Yes Historical Provider, MD  promethazine (PHENERGAN) 25 MG tablet Take 25 mg by mouth every 6 (six) hours as needed for nausea/vomiting. 12/16/15  Yes Historical Provider, MD  SUBOXONE 8-2 MG FILM Place 1 Film under the tongue 2 (two) times daily. 04/30/15  Yes Historical Provider, MD  valsartan-hydrochlorothiazide (DIOVAN-HCT) 160-12.5 MG tablet Take 1 tablet by mouth daily.  12/12/15  Yes Historical Provider, MD  folic acid (FOLVITE) 1 MG tablet Take 1 tablet  (1 mg total) by mouth daily. Patient not taking: Reported on 12/25/2015 05/04/15   Bonnielee Haff, MD  valsartan (DIOVAN) 160 MG tablet Take 1 tablet (160 mg total) by mouth daily. Patient not taking: Reported on 12/25/2015 06/04/15   Nita Sells, MD    Family History Family History  Problem Relation Age of Onset  . Stroke Father   . Hypertension Father   . Lung cancer Father     Social History Social History  Substance Use Topics  . Smoking status: Current Every Day Smoker    Packs/day: 1.00    Years: 42.00    Types: Cigarettes  . Smokeless tobacco: Never Used     Comment: prior trial of zyban made her feel weird   . Alcohol use No     Allergies   Haldol [haloperidol lactate] and Naproxen   Review of Systems Review of Systems  Constitutional: Negative for chills and fever.  HENT: Positive for congestion.   Eyes: Negative for visual disturbance.  Respiratory: Positive for cough, shortness of breath and wheezing.   Cardiovascular: Negative.   Gastrointestinal: Negative for abdominal pain, nausea and vomiting.  Genitourinary: Negative for dysuria.  Musculoskeletal: Negative for back pain.  Skin: Negative for color change.  Neurological: Negative for headaches.     Physical Exam Updated Vital Signs BP 119/77 (BP Location: Right Arm)   Pulse 91   Temp 98.3 F (36.8 C) (Oral)   Resp 18   LMP 08/28/2012   SpO2 94%   Physical Exam  Constitutional: She is oriented to person, place, and time. She appears well-developed and well-nourished. No distress.  HENT:  Head: Normocephalic and atraumatic.  Cardiovascular: Normal rate, regular rhythm and normal heart sounds.   No murmur heard. Pulmonary/Chest: No respiratory distress.  95% O2 on 2L. Inspiratory and expiratory wheezing bilaterally. Equal chest expansion.   Abdominal: Soft. She exhibits no distension. There is no tenderness.  Musculoskeletal: She exhibits no edema.  Neurological: She is alert and  oriented to person, place, and time.  Skin: Skin is warm and dry.  Nursing note and vitals reviewed.    ED Treatments / Results  Labs (all labs ordered are listed, but only abnormal results are displayed) Labs Reviewed  CBC WITH DIFFERENTIAL/PLATELET - Abnormal; Notable for the following:       Result Value   WBC 13.6 (*)    RBC 3.46 (*)    Hemoglobin 10.2 (*)    HCT 29.2 (*)    RDW 16.3 (*)    Platelets 457 (*)    Neutro Abs 11.7 (*)    All other components within normal limits  BASIC METABOLIC PANEL - Abnormal; Notable for the following:    Sodium 125 (*)    Chloride 93 (*)  CO2 20 (*)    Glucose, Bld 101 (*)    Calcium 8.5 (*)    All other components within normal limits  BRAIN NATRIURETIC PEPTIDE - Abnormal; Notable for the following:    B Natriuretic Peptide 518.8 (*)    All other components within normal limits  BLOOD GAS, VENOUS - Abnormal; Notable for the following:    pCO2, Ven 36.9 (*)    Acid-base deficit 2.2 (*)    All other components within normal limits  URINALYSIS, ROUTINE W REFLEX MICROSCOPIC (NOT AT [facility])  SODIUM, URINE, RANDOM  CREATININE, URINE, RANDOM  OSMOLALITY, URINE  PROCALCITONIN  D-DIMER, QUANTITATIVE (NOT AT [facility])  TROPONIN I  TROPONIN I  TROPONIN I  TSH  RAPID URINE DRUG SCREEN, HOSP PERFORMED    EKG  EKG Interpretation None       Radiology Dg Chest 2 View  Result Date: 12/25/2015 CLINICAL DATA:  Shortness of breath, lightheadedness, diagnosed with bronchitis 1 week ago, history GERD, hypertension, pneumonia, smoker EXAM: CHEST  2 VIEW COMPARISON:  Earlier 12/25/2015 portable chest radiograph and an earlier exam of 06/01/2015 FINDINGS: Enlargement of cardiac silhouette. Mediastinal contours and pulmonary vascularity normal. Diffuse interstitial infiltrates appear little changed since 06/01/2015, could represent mild recurrent edema, atypical infection, or underlying chronic interstitial disease. No segmental consolidation,  pleural effusion or pneumothorax. Mild central peribronchial thickening. Bones demineralized with scattered endplate spur formation thoracic spine. IMPRESSION: Mild enlargement of cardiac silhouette. Bronchitic changes with chronic interstitial accentuation, question mild edema or atypical infection versus chronic interstitial disease. Electronically Signed   By: Lavonia Dana M.D.   On: 12/25/2015 19:56   Dg Chest Port 1 View  Result Date: 12/25/2015 CLINICAL DATA:  Progressive worsening of shortness of breath over the past week. History of seasonal asthma and episodes of pneumonia. Current smoker. EXAM: PORTABLE CHEST 1 VIEW COMPARISON:  PA and lateral chest x-ray of June 01, 2015 FINDINGS: The lungs are well-expanded. The pulmonary interstitial markings are increased diffusely and overall are more conspicuous than on the previous study. The cardiac silhouette is enlarged but not greatly changed from the previous study. The pulmonary vascularity is mildly engorged and indistinct. The mediastinum is normal in width. There is no pleural effusion. The bony thorax exhibits no acute abnormality. IMPRESSION: Increased interstitial prominence bilaterally may reflect interstitial edema or interstitial pneumonia. There is no alveolar pneumonia. When the patient can tolerate the procedure, a PA and lateral chest x-ray would be useful. Electronically Signed   By: David  Martinique M.D.   On: 12/25/2015 16:33    Procedures Procedures (including critical care time)  Medications Ordered in ED Medications  albuterol (PROVENTIL,VENTOLIN) solution continuous neb (0 mg/hr Nebulization Stopped 12/25/15 1700)  albuterol (PROVENTIL) (2.5 MG/3ML) 0.083% nebulizer solution (  Not Given 12/25/15 1551)  albuterol (PROVENTIL) (2.5 MG/3ML) 0.083% nebulizer solution 5 mg (5 mg Nebulization Given 12/25/15 1531)  ipratropium (ATROVENT) nebulizer solution 1 mg (1 mg Nebulization Given 12/25/15 1554)  furosemide (LASIX) injection 20 mg  (20 mg Intravenous Given 12/25/15 1815)  diazepam (VALIUM) tablet 5 mg (5 mg Oral Given 12/25/15 1957)     Initial Impression / Assessment and Plan / ED Course  I have reviewed the triage vital signs and the nursing notes.  Pertinent labs & imaging results that were available during my care of the patient were reviewed by me and considered in my medical decision making (see chart for details).  Clinical Course   Madeline Dickerson is a 55 y.o. female who  presents to ED for worsening shortness of breath over the last 2 weeks. Started on Z-Pak and Symbicort inhaler with little relief. Given 10 mg of albuterol, 0.5 Atrovent, 125 Solu-Medrol by EMS prior to arrival. On examination, patient is afebrile and hemodynamically stable. She is satting around 95% O2 on 2 L. She does not typically oxygen at home. She has harsh inspiratory and expiratory wheezing bilaterally despite medications provided by EMS. Will place on continuous neb and continue to monitor.  5:21 PM - Patient reevaluated following continuous neb. On repeat lung exam, wheezing is very much improved. She does have crackles to bilateral lower lung fields. She ambulated to the restroom with assistance by nursing staff and evaluation was done immediately after arrival back to room. Oxygen sats dropped to 74% on room air. She was immediately placed on 3 L O2 with sats now and 90%, speaking in full sentences with no signs of distress.   CXR shows interstitial edema versus interstitial pneumonia. CBC with white count of 13.6. BMP hyponatremic at 125, CO2 20, cl 93. BNP of 518.  VBG with ph of 7.39, bicarb 21.9.   Hospitalist consulted who will admit.  Final Clinical Impressions(s) / ED Diagnoses   Final diagnoses:  Shortness of breath    New Prescriptions New Prescriptions   No medications on file     [facility], PA-C 12/25/15 2030    Charlesetta Shanks, MD 12/26/15 1230

## 2015-12-26 ENCOUNTER — Inpatient Hospital Stay (HOSPITAL_COMMUNITY): Payer: Medicare Other

## 2015-12-26 ENCOUNTER — Encounter (HOSPITAL_COMMUNITY): Payer: Self-pay | Admitting: Radiology

## 2015-12-26 DIAGNOSIS — R55 Syncope and collapse: Secondary | ICD-10-CM

## 2015-12-26 DIAGNOSIS — D500 Iron deficiency anemia secondary to blood loss (chronic): Secondary | ICD-10-CM

## 2015-12-26 DIAGNOSIS — E871 Hypo-osmolality and hyponatremia: Secondary | ICD-10-CM

## 2015-12-26 DIAGNOSIS — J441 Chronic obstructive pulmonary disease with (acute) exacerbation: Secondary | ICD-10-CM

## 2015-12-26 DIAGNOSIS — D72828 Other elevated white blood cell count: Secondary | ICD-10-CM

## 2015-12-26 DIAGNOSIS — Z720 Tobacco use: Secondary | ICD-10-CM

## 2015-12-26 LAB — ECHOCARDIOGRAM COMPLETE
Height: 67 in
Weight: 3185.6 oz

## 2015-12-26 LAB — COMPREHENSIVE METABOLIC PANEL
ALT: 20 U/L (ref 14–54)
AST: 35 U/L (ref 15–41)
Albumin: 3 g/dL — ABNORMAL LOW (ref 3.5–5.0)
Alkaline Phosphatase: 86 U/L (ref 38–126)
Anion gap: 8 (ref 5–15)
BUN: 13 mg/dL (ref 6–20)
CO2: 24 mmol/L (ref 22–32)
Calcium: 8.3 mg/dL — ABNORMAL LOW (ref 8.9–10.3)
Chloride: 92 mmol/L — ABNORMAL LOW (ref 101–111)
Creatinine, Ser: 0.77 mg/dL (ref 0.44–1.00)
GFR calc Af Amer: >60 mL/min (ref >60)
GFR calc non Af Amer: >60 mL/min (ref >60)
Glucose, Bld: 175 mg/dL — ABNORMAL HIGH (ref 65–99)
Potassium: 4 mmol/L (ref 3.5–5.1)
Sodium: 124 mmol/L — ABNORMAL LOW (ref 135–145)
Total Bilirubin: 0.7 mg/dL (ref 0.3–1.2)
Total Protein: 6.9 g/dL (ref 6.5–8.1)

## 2015-12-26 LAB — CBC
HCT: 26.8 % — ABNORMAL LOW (ref 36.0–46.0)
Hemoglobin: 9.2 g/dL — ABNORMAL LOW (ref 12.0–15.0)
MCH: 29.1 pg (ref 26.0–34.0)
MCHC: 34.3 g/dL (ref 30.0–36.0)
MCV: 84.8 fL (ref 78.0–100.0)
Platelets: 409 10*3/uL — ABNORMAL HIGH (ref 150–400)
RBC: 3.16 MIL/uL — ABNORMAL LOW (ref 3.87–5.11)
RDW: 16.3 % — ABNORMAL HIGH (ref 11.5–15.5)
WBC: 15.9 10*3/uL — ABNORMAL HIGH (ref 4.0–10.5)

## 2015-12-26 LAB — IRON AND TIBC
Iron: 11 ug/dL — ABNORMAL LOW (ref 28–170)
Saturation Ratios: 4 % — ABNORMAL LOW (ref 10.4–31.8)
TIBC: 266 ug/dL (ref 250–450)
UIBC: 255 ug/dL

## 2015-12-26 LAB — FERRITIN: Ferritin: 271 ng/mL (ref 11–307)

## 2015-12-26 LAB — TROPONIN I
Troponin I: 0.06 ng/mL (ref <0.03)
Troponin I: 0.04 ng/mL (ref <0.03)
Troponin I: 0.03 ng/mL (ref <0.03)

## 2015-12-26 LAB — PHOSPHORUS: Phosphorus: 2.9 mg/dL (ref 2.5–4.6)

## 2015-12-26 LAB — FOLATE: Folate: 7.4 ng/mL (ref >5.9)

## 2015-12-26 LAB — MAGNESIUM: Magnesium: 1.6 mg/dL — ABNORMAL LOW (ref 1.7–2.4)

## 2015-12-26 LAB — SODIUM, URINE, RANDOM: Sodium, Ur: 12 mmol/L

## 2015-12-26 LAB — RETICULOCYTES
RBC.: 3.16 MIL/uL — ABNORMAL LOW (ref 3.87–5.11)
Retic Count, Absolute: 66.4 10*3/uL (ref 19.0–186.0)
Retic Ct Pct: 2.1 % (ref 0.4–3.1)

## 2015-12-26 LAB — OSMOLALITY, URINE: Osmolality, Ur: 351 mOsm/kg (ref 300–900)

## 2015-12-26 LAB — OSMOLALITY: Osmolality: 269 mOsm/kg — ABNORMAL LOW (ref 275–295)

## 2015-12-26 LAB — VITAMIN B12: Vitamin B-12: 110 pg/mL — ABNORMAL LOW (ref 180–914)

## 2015-12-26 MED ORDER — IPRATROPIUM BROMIDE 0.02 % IN SOLN: 1.0000 mg | RESPIRATORY_TRACT | Status: DC | PRN

## 2015-12-26 MED ORDER — IPRATROPIUM BROMIDE 0.02 % IN SOLN: 0.5000 mg | RESPIRATORY_TRACT | Status: DC | PRN

## 2015-12-26 MED ORDER — ORAL CARE MOUTH RINSE
15.0000 mL | Freq: Two times a day (BID) | OROMUCOSAL | Status: DC
  Administered 2015-12-26: 15 mL via OROMUCOSAL

## 2015-12-26 MED ORDER — CHLORHEXIDINE GLUCONATE 0.12 % MT SOLN
15.0000 mL | Freq: Two times a day (BID) | OROMUCOSAL | Status: DC
Start: 2015-12-26 — End: 2015-12-28
  Administered 2015-12-26 – 2015-12-28 (×6): 15 mL via OROMUCOSAL
  Filled 2015-12-26 (×5): qty 15

## 2015-12-26 MED ORDER — IOPAMIDOL (ISOVUE-370) INJECTION 76%
100.0000 mL | Freq: Once | INTRAVENOUS | Status: AC | PRN
  Administered 2015-12-26: 100 mL via INTRAVENOUS

## 2015-12-26 MED ORDER — IPRATROPIUM-ALBUTEROL 0.5-2.5 (3) MG/3ML IN SOLN
3.0000 mL | Freq: Three times a day (TID) | RESPIRATORY_TRACT | Status: DC
  Administered 2015-12-27 – 2015-12-28 (×4): 3 mL via RESPIRATORY_TRACT
  Filled 2015-12-26 (×4): qty 3

## 2015-12-26 MED ORDER — IBUPROFEN 200 MG PO TABS
400.0000 mg | ORAL_TABLET | Freq: Four times a day (QID) | ORAL | Status: DC | PRN
  Administered 2015-12-26 – 2015-12-27 (×5): 400 mg via ORAL
  Filled 2015-12-26 (×5): qty 2

## 2015-12-26 NOTE — Progress Notes (Addendum)
Patient ID: Madeline Dickerson, female   DOB: 13-Sep-1960, 55 y.o.   MRN: [id number]  PROGRESS NOTE    Madeline Dickerson  [id number] DOB: 24-Oct-1960 DOA: 12/25/2015  PCP: Elyn Peers, MD   Brief Narrative:  55 y.o. female with past medical history significant for chronic pain, hyponatremia, HTN, tobacco abuse, bipolar disorder, prior admissions for acute respiratory failure with hypoxia, history of seasonal asthma. She presented to [facility] ED with worsening shortness of breath over past 2 weeks prior to this admission. She was on z pack and Symbicort given to her outpt but she reports she has had no significant symptomatic relief.  Pt was hemodynamically stable on admission. Her blood work showed WBC count 13.6, hgb 10.2, sodium 125, troponin 0.06. CT angio chest was negative for PE. CXR showed bronchitic changes with chronic interstitial changes versus atypical infection or chronic interstitial disease. She was started on empiric azithro and rocephin for possible pneumonia. She was given solumedrol and nebulizer treatments but continued to be short of breath.    Assessment & Plan:   Active Problems: Acute respiratory failure with hypoxia (HCC) / Acute COPD exacerbation - Started on solumedrol and duoneb every 6 hours scheduled as well as xopenex and Atrovent as needed Q 2 hours for shortness of breath or wheezing - Continue oxygen support via [place] to keep O2 sats above 90%  Lobar pneumonia, unspecified organism / Leukocytosis  - Continue azithromycin and rocephin - Blood cx and resp cx not obtained at the time of the admission  Depression, anxiety - Continue Prozac, lyrica, valium  Hyponatremia - Likely form acute lung process, possible pneumonia versus dehydration versus dose of lasix she has received in ED - Obtain sodium urine , osm urine and serum osmolarity  Tobacco abuse - Counseled on smoking cessation   Troponin elevation - No chest pain - Likely demand ischemia from acute  infection - No acute ischemic changes on 12 lead EKG   DVT prophylaxis: Lovenox subQ  Code Status: full code  Family Communication: no family at the bedside this am  Disposition Plan: home once resp status better    Consultants:   None   Procedures:   None   Antimicrobials:   Azithromycin and Rocephin 12/25/2015 -->    Subjective: Feels short of breath with exertion.   Objective: Vitals:   12/25/15 2203 12/26/15 0159 12/26/15 0203 12/26/15 0626  BP:   106/63 (!) 115/58  Pulse:   78 74  Resp:   18 20  Temp:   99.3 F (37.4 C)   TempSrc:   Oral   SpO2: 90% 95% 95% 95%  Weight:      Height:        Intake/Output Summary (Last 24 hours) at 12/26/15 1037 Last data filed at 12/26/15 0914  Gross per 24 hour  Intake              250 ml  Output             3250 ml  Net            -3000 ml   Filed Weights   12/25/15 2106  Weight: 90.3 kg (199 lb 1.6 oz)    Examination:  General exam: Appears calm and comfortable  Respiratory system: rhonchorous and wheezing in upper lung lobes  Cardiovascular system: S1 & S2 heard, RRR. No JVD, murmurs, rubs, gallops or clicks. No pedal edema. Gastrointestinal system: Abdomen is nondistended, soft and nontender. No organomegaly or masses felt.  Normal bowel sounds heard. Central nervous system: Alert and oriented. No focal neurological deficits. Extremities: Symmetric 5 x 5 power. Skin: No rashes, lesions or ulcers Psychiatry: Judgement and insight appear normal. Mood & affect appropriate.   Data Reviewed: I have personally reviewed following labs and imaging studies  CBC:  Recent Labs Lab 12/25/15 1601 12/26/15 0147  WBC 13.6* 15.9*  NEUTROABS 11.7*  --   HGB 10.2* 9.2*  HCT 29.2* 26.8*  MCV 84.4 84.8  PLT 457* [id number]*   Basic Metabolic Panel:  Recent Labs Lab 12/25/15 1601 12/26/15 0147  NA 125* 124*  K 4.6 4.0  CL 93* 92*  CO2 20* 24  GLUCOSE 101* 175*  BUN 16 13  CREATININE 0.80 0.77  CALCIUM 8.5* 8.3*   MG  --  1.6*  PHOS  --  2.9   GFR: Estimated Creatinine Clearance: 89.5 mL/min (by C-G formula based on SCr of 0.77 mg/dL). Liver Function Tests:  Recent Labs Lab 12/26/15 0147  AST 35  ALT 20  ALKPHOS 86  BILITOT 0.7  PROT 6.9  ALBUMIN 3.0*   No results for input(s): LIPASE, AMYLASE in the last 168 hours. No results for input(s): AMMONIA in the last 168 hours. Coagulation Profile: No results for input(s): INR, PROTIME in the last 168 hours. Cardiac Enzymes:  Recent Labs Lab 12/25/15 2143 12/26/15 0147 12/26/15 0806  TROPONINI 0.06* 0.04* 0.03*   BNP (last 3 results) No results for input(s): PROBNP in the last 8760 hours. HbA1C: No results for input(s): HGBA1C in the last 72 hours. CBG: No results for input(s): GLUCAP in the last 168 hours. Lipid Profile: No results for input(s): CHOL, HDL, LDLCALC, TRIG, CHOLHDL, LDLDIRECT in the last 72 hours. Thyroid Function Tests:  Recent Labs  12/25/15 2143  TSH 3.591   Anemia Panel:  Recent Labs  12/26/15 0147  FOLATE 7.4  RETICCTPCT 2.1   Urine analysis:    Component Value Date/Time   COLORURINE YELLOW 12/25/2015 1939   APPEARANCEUR CLEAR 12/25/2015 1939   LABSPEC 1.007 12/25/2015 1939   PHURINE 6.0 12/25/2015 1939   GLUCOSEU NEGATIVE 12/25/2015 1939   HGBUR NEGATIVE 12/25/2015 1939   BILIRUBINUR NEGATIVE 12/25/2015 1939   KETONESUR NEGATIVE 12/25/2015 1939   PROTEINUR NEGATIVE 12/25/2015 1939   UROBILINOGEN 0.2 04/28/2014 1635   NITRITE NEGATIVE 12/25/2015 1939   LEUKOCYTESUR NEGATIVE 12/25/2015 1939   Sepsis Labs: @LABRCNTIP (procalcitonin:4,lacticidven:4)   )No results found for this or any previous visit (from the past 240 hour(s)).    Radiology Studies: Dg Chest 2 View Result Date: 12/25/2015 Mild enlargement of cardiac silhouette. Bronchitic changes with chronic interstitial accentuation, question mild edema or atypical infection versus chronic interstitial disease.  Ct Angio Chest Pe W Or  Wo Contrast Result Date: 12/26/2015 1. Negative for pulmonary embolism. 2. Smooth interlobular septal thickening, suggesting interstitial fluid. This could represent a mild degree of congestive heart failure. There also is mosaic attenuation which suggests air trapping.   Dg Chest Port 1 View Result Date: 12/25/2015  Increased interstitial prominence bilaterally may reflect interstitial edema or interstitial pneumonia. There is no alveolar pneumonia. When the patient can tolerate the procedure, a PA and lateral chest x-ray would be useful.      Scheduled Meds: . azithromycin  500 mg Intravenous Q24H  . buprenorphine  2 mg Sublingual Daily  . cefTRIAXone (ROCEPHIN) IVPB 1 gram/50 mL D5W  1 g Intravenous Q24H  . chlorhexidine  15 mL Mouth Rinse BID  . enoxaparin (LOVENOX) injection  40 mg  Subcutaneous Q24H  . FLUoxetine  40 mg Oral Daily  . guaiFENesin  600 mg Oral BID  . ipratropium-albuterol  3 mL Nebulization Q6H  . mouth rinse  15 mL Mouth Rinse q12n4p  . methylPREDNISolone (SOLU-MEDROL) injection  60 mg Intravenous Q12H  . nicotine  21 mg Transdermal Daily  . pantoprazole  40 mg Oral Daily  . pregabalin  300 mg Oral BID  . sodium chloride flush  3 mL Intravenous Q12H  . sodium chloride flush  3 mL Intravenous Q12H   Continuous Infusions:    LOS: 1 day    Time spent: 25 minutes  Greater than 50% of the time spent on counseling and coordinating the care.   Leisa Lenz, MD [facility] Pager [phone]  If 7PM-7AM, please contact night-coverage [email or web address] Password [email or web address] 12/26/2015, 10:37 AM

## 2015-12-26 NOTE — Consult Note (Signed)
Name: Madeline Dickerson MRN: [id number] DOB: 12/06/1960    ADMISSION DATE:  12/25/2015 CONSULTATION DATE:  12/26/15  REFERRING MD :  Devine/ [facility]  CHIEF COMPLAINT:  Short of breath  BRIEF PATIENT DESCRIPTION: 55 y.o. female smoker with medical history significant of chronic pain, hyponatremia, HTN, tobacco abuse, bipolar disorder, prior admissions for acute respiratory failure with  Hypoxia, history of seasonal asthma  SIGNIFICANT EVENTS    STUDIES:  CTa chest 12/26/15   HISTORY OF PRESENT ILLNESS:  55 yo F smoker with hx seasonal asthma/ COPD. BiPolar. Chronic back pain/ opiate dependency now on control program/ Subutex, HTN. Admitted 3/17 w asthmatic bronchitis, hyponatremia - HCTZ stopped.  Presented ED 11/3 w SOB. Describes onset of  "chest cold" 3 weeks ago. Treated as outpt w Zpak x 2, cortisone inj. Low grade fever initially, cough scant productive white/ yellow, no blood. No GI upset. Chest tight w/o pain. Mild intermittent dependent pedal edema.  Chest CTa reviewed by me- neg PE. Interstitial edema and mosaic air-trapping/ emphysema. PCCM asked to eval.  PAST MEDICAL HISTORY :   has a past medical history of Anxiety; Arthritis; Chronic low back pain; Depression; Fibromyalgia; GERD (gastroesophageal reflux disease); Hypertension; Pneumonia (05/01/2015); Rotator cuff disorder; Seasonal asthma; Shortness of breath dyspnea; and Walking pneumonia.  has a past surgical history that includes Lumbar microdiscectomy (01/1990; 2003); Spinal cord stimulator implant (2012); Cesarean section (1986); Posterior lumbar fusion (2011; 04/27/2011); Back surgery; Tubal ligation (1986); Spinal cord stimulator removal (08/2010); Repair dural / CSF leak (02/1990); and Lumbar spine surgery (03/1991).   Prior to Admission medications   Medication Sig Start Date End Date Taking? Authorizing Provider  azithromycin (ZITHROMAX) 250 MG tablet Take 250-500 mg by mouth daily. Take 500mg  on day 1 and then 250mg  for 5  days 12/22/15  Yes Historical Provider, MD  diazepam (VALIUM) 10 MG tablet Take 10 mg by mouth 2 (two) times daily as needed for anxiety. 09/25/15  Yes Historical Provider, MD  FLUoxetine (PROZAC) 40 MG capsule Take 1 capsule (40 mg total) by mouth daily. 11/05/14  Yes Kaitlyn Szekalski, PA-C  LYRICA 300 MG capsule Take 300 mg by mouth 2 (two) times daily. 12/21/15  Yes Historical Provider, MD  omeprazole (PRILOSEC) 40 MG capsule Take 40 mg by mouth daily.   Yes Historical Provider, MD  promethazine (PHENERGAN) 25 MG tablet Take 25 mg by mouth every 6 (six) hours as needed for nausea/vomiting. 12/16/15  Yes Historical Provider, MD  SUBOXONE 8-2 MG FILM Place 1 Film under the tongue 2 (two) times daily. 04/30/15  Yes Historical Provider, MD  valsartan-hydrochlorothiazide (DIOVAN-HCT) 160-12.5 MG tablet Take 1 tablet by mouth daily.  12/12/15  Yes Historical Provider, MD  folic acid (FOLVITE) 1 MG tablet Take 1 tablet (1 mg total) by mouth daily. Patient not taking: Reported on 12/25/2015 05/04/15   Bonnielee Haff, MD  valsartan (DIOVAN) 160 MG tablet Take 1 tablet (160 mg total) by mouth daily. Patient not taking: Reported on 12/25/2015 06/04/15   Nita Sells, MD   Allergies  Allergen Reactions  . Haldol [Haloperidol Lactate] Other (See Comments)    Couldn't talk, couldn't move   . Naproxen Nausea And Vomiting    FAMILY HISTORY:  family history includes Hypertension in her father; Lung cancer in her father; Stroke in her father. SOCIAL HISTORY:  reports that she has been smoking Cigarettes.  She has a 42.00 pack-year smoking history. She has never used smokeless tobacco. She reports that she uses drugs. She reports  that she does not drink alcohol.  REVIEW OF SYSTEMS:  + = pos Constitutional: Negative for fever, chills, weight loss, malaise/fatigue and diaphoresis.  HENT: Negative for hearing loss, ear pain, nosebleeds, congestion, sore throat, neck pain, tinnitus and ear discharge.   Eyes:  Negative for blurred vision, double vision, photophobia, pain, discharge and redness.  Respiratory: + cough, hemoptysis, +sputum production, +shortness of breath, +wheezing, stridor.  Cardiovascular: Negative for chest pain, palpitations, orthopnea, claudication, +leg swelling and PND.  Gastrointestinal: Negative for heartburn, nausea, vomiting, abdominal pain, diarrhea, constipation, blood in stool and melena.  Genitourinary: Negative for dysuria, urgency, frequency, hematuria and flank pain.  Musculoskeletal: Negative for myalgias, back pain, joint pain and falls.  Skin: Negative for itching and rash.  Neurological: Negative for dizziness, tingling, tremors, sensory change, speech change, focal weakness, seizures, loss of consciousness, weakness and headaches.  Endo/Heme/Allergies: Negative for environmental allergies and polydipsia. Does not bruise/bleed easily.  SUBJECTIVE:   VITAL SIGNS: Temp:  [98.3 F (36.8 C)-99.7 F (37.6 C)] 99.3 F (37.4 C) (11/04 0203) Pulse Rate:  [74-109] 74 (11/04 0626) Resp:  [18-22] 20 (11/04 0626) BP: (106-150)/(58-85) 115/58 (11/04 0626) SpO2:  [90 %-97 %] 95 % (11/04 0626) Weight:  [90.3 kg (199 lb 1.6 oz)] 90.3 kg (199 lb 1.6 oz) (11/03 2106)  PHYSICAL EXAMINATION: General:  Obese, alert, pleasant woman eating breakfast in bed Neuro:  Oriented x 3, cooperative, non-focal HEENT:  Denture missing teeth, mucosa moist, no stridor Cardiovascular: RR, no audible m/g/r. Trace edema feet Lungs:  Coarse wheeze/ crackles in bases do not clear with cough, unlabored at rest Abdomen:  Eating, soft, protuberant Musculoskeletal:  Symmetrical strength, normal muscle bulk Skin:  No rash or bruising seen   Recent Labs Lab 12/25/15 1601 12/26/15 0147  NA 125* 124*  K 4.6 4.0  CL 93* 92*  CO2 20* 24  BUN 16 13  CREATININE 0.80 0.77  GLUCOSE 101* 175*    Recent Labs Lab 12/25/15 1601 12/26/15 0147  HGB 10.2* 9.2*  HCT 29.2* 26.8*  WBC 13.6* 15.9*    PLT 457* 409*   Dg Chest 2 View  Result Date: 12/25/2015 CLINICAL DATA:  Shortness of breath, lightheadedness, diagnosed with bronchitis 1 week ago, history GERD, hypertension, pneumonia, smoker EXAM: CHEST  2 VIEW COMPARISON:  Earlier 12/25/2015 portable chest radiograph and an earlier exam of 06/01/2015 FINDINGS: Enlargement of cardiac silhouette. Mediastinal contours and pulmonary vascularity normal. Diffuse interstitial infiltrates appear little changed since 06/01/2015, could represent mild recurrent edema, atypical infection, or underlying chronic interstitial disease. No segmental consolidation, pleural effusion or pneumothorax. Mild central peribronchial thickening. Bones demineralized with scattered endplate spur formation thoracic spine. IMPRESSION: Mild enlargement of cardiac silhouette. Bronchitic changes with chronic interstitial accentuation, question mild edema or atypical infection versus chronic interstitial disease. Electronically Signed   By: Lavonia Dana M.D.   On: 12/25/2015 19:56   Ct Angio Chest Pe W Or Wo Contrast  Result Date: 12/26/2015 CLINICAL DATA:  Dyspnea for 1 week. EXAM: CT ANGIOGRAPHY CHEST WITH CONTRAST TECHNIQUE: Multidetector CT imaging of the chest was performed using the standard protocol during bolus administration of intravenous contrast. Multiplanar CT image reconstructions and MIPs were obtained to evaluate the vascular anatomy. CONTRAST:  100 mL Isovue 370 intravenous COMPARISON:  Radiographs 12/25/2015 and prior studies dating back to 11/05/2014 FINDINGS: Cardiovascular: Satisfactory opacification of the pulmonary arteries to the segmental level. No evidence of pulmonary embolism. Normal heart size. No pericardial effusion. Mediastinum/Nodes: No enlarged mediastinal, hilar, or axillary lymph  nodes. Thyroid gland, trachea, and esophagus demonstrate no significant findings. Lungs/Pleura: There is smooth thickening of the interlobular septa, suggesting interstitial  fluid. There is mosaic attenuation throughout both lungs. This may represent air trapping and perhaps a minor degree of alveolar edema. No pleural effusions. Airways are patent. Upper Abdomen: No significant abnormality. Musculoskeletal: No significant skeletal lesion. Review of the MIP images confirms the above findings. IMPRESSION: 1. Negative for pulmonary embolism. 2. Smooth interlobular septal thickening, suggesting interstitial fluid. This could represent a mild degree of congestive heart failure. There also is mosaic attenuation which suggests air trapping. Electronically Signed   By: Andreas Newport M.D.   On: 12/26/2015 06:20   Dg Chest Port 1 View  Result Date: 12/25/2015 CLINICAL DATA:  Progressive worsening of shortness of breath over the past week. History of seasonal asthma and episodes of pneumonia. Current smoker. EXAM: PORTABLE CHEST 1 VIEW COMPARISON:  PA and lateral chest x-ray of June 01, 2015 FINDINGS: The lungs are well-expanded. The pulmonary interstitial markings are increased diffusely and overall are more conspicuous than on the previous study. The cardiac silhouette is enlarged but not greatly changed from the previous study. The pulmonary vascularity is mildly engorged and indistinct. The mediastinum is normal in width. There is no pleural effusion. The bony thorax exhibits no acute abnormality. IMPRESSION: Increased interstitial prominence bilaterally may reflect interstitial edema or interstitial pneumonia. There is no alveolar pneumonia. When the patient can tolerate the procedure, a PA and lateral chest x-ray would be useful. Electronically Signed   By: David  Martinique M.D.   On: 12/25/2015 16:33    ASSESSMENT / PLAN:  COPD mixed type with acute exacerbation.- No PFT available but this chronic smoker has patchy lucency on CT c/w emphysema. Previously had samples of Symbicort and albuterol HFA she remembers as helpful. She associates exacerbations with season changes and  viral infections. History suggests viral tracheobronchitis syndrome triggered this time. There is an interstitial edema component and mild cardiomegaly.  Tobacco abuse- Counseling initiated. More sustained outpatient effort required.  Anemia- undefined. This will aggravate dyspnea esp w exertion  Hyponatremia- She needs to lose some water weight, but tends to hyponatremia. Try fluid restriction while here, for assessment.  Recommend- continue present meds. Taper steroids as able, watching for steroid mental health impact.                         -Labs to track electrolytes, BNP   [facility] [place] Pager: [phone]  12/26/2015, 10:08 AM

## 2015-12-26 NOTE — Evaluation (Signed)
Occupational Therapy Evaluation Patient Details Name: Madeline Dickerson MRN: [id number] DOB: [birth year] Today's Date: 12/26/2015    History of Present Illness 55 y.o. female with past medical history significant for chronic pain, hyponatremia, HTN, tobacco abuse, bipolar disorder, prior admissions for acute respiratory failure with hypoxia, history of seasonal asthma and admitted for acute respiratory failure with hypoxia   Clinical Impression   Pt admitted with above. She demonstrates the below listed deficits and will benefit from continued OT to maximize safety and independence with BADLs.  Pt presents to OT with decreased activity tolerance, generalized weakness.  She requires supervision for ADLs with DOE 3/4 and 02 sats 86% on 2L supplemental 02.  Sats improved to 97% on 3L.  Pt instructed in pursed lip breathing.  Began instruction on energy conservation techniques       Follow Up Recommendations  No OT follow up;Supervision - Intermittent    Equipment Recommendations  Tub/shower seat    Recommendations for Other Services       Precautions / Restrictions Precautions Precautions: Fall Precaution Comments: monitor sats      Mobility Bed Mobility Overal bed mobility: Modified Independent                Transfers Overall transfer level: Needs assistance Equipment used: None Transfers: Sit to/from Stand;Stand Pivot Transfers Sit to Stand: Supervision Stand pivot transfers: Supervision       General transfer comment: min/guard for safety, a little unsteady however able to self correct    Balance                                            ADL Overall ADL's : Needs assistance/impaired Eating/Feeding: Independent   Grooming: Wash/dry hands;Wash/dry face;Oral care;Brushing hair;Supervision/safety;Standing   Upper Body Bathing: Set up;Sitting   Lower Body Bathing: Supervison/ safety;Sit to/from stand Lower Body Bathing Details (indicate  cue type and reason): performed simulated shower in standing - DOE 3/4.  Discussed use of shower seat.  Upper Body Dressing : Set up;Sitting   Lower Body Dressing: Supervision/safety;Sit to/from stand   Toilet Transfer: Supervision/safety;Ambulation;Comfort height toilet   Toileting- Clothing Manipulation and Hygiene: Supervision/safety;Sit to/from stand       Functional mobility during ADLs: Supervision/safety General ADL Comments: Pt fatigues quickly with activity.  Sats 86% on 2L.  Pt instructed in pursed lip breathing.  Sats increased to 97% on 3L      Vision     Perception     Praxis      Pertinent Vitals/Pain Pain Assessment: No/denies pain     Hand Dominance     Extremity/Trunk Assessment Upper Extremity Assessment Upper Extremity Assessment: Overall WFL for tasks assessed   Lower Extremity Assessment Lower Extremity Assessment: Overall WFL for tasks assessed   Cervical / Trunk Assessment Cervical / Trunk Assessment: Normal   Communication Communication Communication: No difficulties   Cognition Arousal/Alertness: Awake/alert Behavior During Therapy: WFL for tasks assessed/performed Overall Cognitive Status: Within Functional Limits for tasks assessed                     General Comments       Exercises       Shoulder Instructions      Home Living Family/patient expects to be discharged to:: Private residence Living Arrangements: Alone Available Help at Discharge: Friend(s);Available PRN/intermittently Type of Home: Mobile home Home Access:  Stairs to enter [employer] of Steps: 5-6 Entrance Stairs-Rails: Right Home Layout: One level     Bathroom Shower/Tub: Tub/shower unit;Curtain Shower/tub characteristics: Architectural technologist: Standard     Home Equipment: Cane - single point          Prior Functioning/Environment Level of Independence: Independent        Comments: she does not drive         OT Problem  List: Decreased strength;Decreased activity tolerance;Decreased knowledge of use of DME or AE;Cardiopulmonary status limiting activity;Obesity   OT Treatment/Interventions: Self-care/ADL training;Therapeutic exercise;Energy conservation;DME and/or AE instruction;Therapeutic activities;Patient/family education    OT Goals(Current goals can be found in the care plan section) Acute Rehab OT Goals Patient Stated Goal: to breathe better and go home  OT Goal Formulation: With patient Time For Goal Achievement: 01/09/16 Potential to Achieve Goals: Good ADL Goals Pt Will Perform Upper Body Bathing: with modified independence;standing;sitting Pt Will Perform Lower Body Bathing: with modified independence;sit to/from stand Pt Will Perform Upper Body Dressing: with modified independence;sitting;standing Pt Will Perform Lower Body Dressing: with modified independence;sit to/from stand Pt Will Transfer to Toilet: with modified independence;ambulating;regular height toilet;bedside commode;grab bars Pt Will Perform Toileting - Clothing Manipulation and hygiene: with modified independence;sit to/from stand Pt Will Perform Tub/Shower Transfer: Tub transfer;with modified independence;ambulating;shower seat Additional ADL Goal #1: Pt will independently incorporate energy conservation techniques into daily activiites   OT Frequency: Min 2X/week   Barriers to D/C: Decreased caregiver support          Co-evaluation              End of Session Equipment Utilized During Treatment: Oxygen Nurse Communication: Mobility status  Activity Tolerance: Patient limited by fatigue Patient left: in bed;with call bell/phone within reach   Time: [id number] OT Time Calculation (min): 24 min Charges:  OT General Charges $OT Visit: 1 Procedure OT Evaluation $OT Eval Moderate Complexity: 1 Procedure OT Treatments $Self Care/Home Management : 8-22 mins G-Codes:    Itzamar Traynor M 01-24-16, 4:33 PM

## 2015-12-26 NOTE — Evaluation (Signed)
Physical Therapy Evaluation Patient Details Name: MEKHIYA PRESTAGE MRN: [id number] DOB: [birth year] Today's Date: 12/26/2015   History of Present Illness  55 y.o. female with past medical history significant for chronic pain, hyponatremia, HTN, tobacco abuse, bipolar disorder, prior admissions for acute respiratory failure with hypoxia, history of seasonal asthma and admitted for acute respiratory failure with hypoxia  Clinical Impression  Pt admitted with above diagnosis. Pt currently with functional limitations due to the deficits listed below (see PT Problem List).  Pt will benefit from skilled PT to increase their independence and safety with mobility to allow discharge to the venue listed below.   Pt assisted to bathroom and then ambulated short distance in hallway.  Pt requires supplemental oxygen for mobility. SATURATION QUALIFICATIONS: (This note is used to comply with regulatory documentation for home oxygen)  Patient Saturations on Room Air at Rest = 83%  Patient Saturations on Room Air while Ambulating = n/a  Patient Saturations on 4 Liters of oxygen while Ambulating = 90%  Please briefly explain why patient needs home oxygen: to maintain oxygen saturations above 90% at rest and with activity.     Follow Up Recommendations Home health PT    Equipment Recommendations  None recommended by PT    Recommendations for Other Services       Precautions / Restrictions Precautions Precautions: Fall Precaution Comments: monitor sats      Mobility  Bed Mobility Overal bed mobility: Modified Independent                Transfers Overall transfer level: Needs assistance Equipment used: None Transfers: Sit to/from Stand Sit to Stand: Min guard         General transfer comment: min/guard for safety, a little unsteady however able to self correct  Ambulation/Gait Ambulation/Gait assistance: Min guard Ambulation Distance (Feet): 40 Feet Assistive device: None Gait  Pattern/deviations: Step-through pattern;Decreased stride length     General Gait Details: 3/4 dyspnea, required supplemental oxygen, distance to tolerance  Stairs            Wheelchair Mobility    Modified Rankin (Stroke Patients Only)       Balance                                             Pertinent Vitals/Pain Pain Assessment: No/denies pain    Home Living Family/patient expects to be discharged to:: Private residence Living Arrangements: Alone   Type of Home: Mobile home Home Access: Stairs to enter Entrance Stairs-Rails: Right Entrance Stairs-Number of Steps: 5-6 Home Layout: One level Home Equipment: Cane - single point      Prior Function Level of Independence: Independent               Hand Dominance        Extremity/Trunk Assessment               Lower Extremity Assessment: Overall WFL for tasks assessed         Communication   Communication: No difficulties  Cognition Arousal/Alertness: Awake/alert Behavior During Therapy: WFL for tasks assessed/performed Overall Cognitive Status: Within Functional Limits for tasks assessed                      General Comments      Exercises     Assessment/Plan    PT Assessment Patient needs  continued PT services  PT Problem List Decreased strength;Decreased activity tolerance;Decreased mobility;Cardiopulmonary status limiting activity          PT Treatment Interventions DME instruction;Gait training;Therapeutic activities;Therapeutic exercise;Functional mobility training;Patient/family education    PT Goals (Current goals can be found in the Care Plan section)  Acute Rehab PT Goals PT Goal Formulation: With patient Time For Goal Achievement: 01/02/16 Potential to Achieve Goals: Good    Frequency Min 3X/week   Barriers to discharge        Co-evaluation               End of Session Equipment Utilized During Treatment: Gait  belt;Oxygen Activity Tolerance: Patient limited by fatigue Patient left: in bed;with call bell/phone within reach           Time: [id number] PT Time Calculation (min) (ACUTE ONLY): 14 min   Charges:   PT Evaluation $PT Eval Low Complexity: 1 Procedure     PT G Codes:        Amma Crear,KATHrine E 12/26/2015, 1:21 PM Carmelia Bake, PT, DPT 12/26/2015 Pager: [phone]

## 2015-12-26 NOTE — Progress Notes (Signed)
CRITICAL VALUE ALERT  Critical value received:  Troponin 0.06  Date of notification:  12/26/15  Time of notification:  0013  Critical value read back:Yes.    Nurse who received alert:  Thana Farr  MD notified (1st page):  Walden Field  Time of first page:  0015  MD notified (2nd page):  Time of second page:  Responding MD: Walden Field  Time MD responded:  [phone]

## 2015-12-26 NOTE — Progress Notes (Signed)
  Echocardiogram 2D Echocardiogram has been performed.  Madeline Dickerson 12/26/2015, 11:25 AM

## 2015-12-27 LAB — CBC
HCT: 29.5 % — ABNORMAL LOW (ref 36.0–46.0)
Hemoglobin: 10 g/dL — ABNORMAL LOW (ref 12.0–15.0)
MCH: 29.5 pg (ref 26.0–34.0)
MCHC: 33.9 g/dL (ref 30.0–36.0)
MCV: 87 fL (ref 78.0–100.0)
Platelets: 434 10*3/uL — ABNORMAL HIGH (ref 150–400)
RBC: 3.39 MIL/uL — ABNORMAL LOW (ref 3.87–5.11)
RDW: 16.6 % — ABNORMAL HIGH (ref 11.5–15.5)
WBC: 23.7 10*3/uL — ABNORMAL HIGH (ref 4.0–10.5)

## 2015-12-27 LAB — BASIC METABOLIC PANEL
Anion gap: 10 (ref 5–15)
BUN: 20 mg/dL (ref 6–20)
CO2: 22 mmol/L (ref 22–32)
Calcium: 8.6 mg/dL — ABNORMAL LOW (ref 8.9–10.3)
Chloride: 93 mmol/L — ABNORMAL LOW (ref 101–111)
Creatinine, Ser: 0.77 mg/dL (ref 0.44–1.00)
GFR calc Af Amer: >60 mL/min (ref >60)
GFR calc non Af Amer: >60 mL/min (ref >60)
Glucose, Bld: 127 mg/dL — ABNORMAL HIGH (ref 65–99)
Potassium: 5.7 mmol/L — ABNORMAL HIGH (ref 3.5–5.1)
Sodium: 125 mmol/L — ABNORMAL LOW (ref 135–145)

## 2015-12-27 LAB — HEMOGLOBIN A1C
Hgb A1c MFr Bld: 6 % — ABNORMAL HIGH (ref 4.8–5.6)
Mean Plasma Glucose: 126 mg/dL

## 2015-12-27 LAB — EXPECTORATED SPUTUM ASSESSMENT W REFEX TO RESP CULTURE

## 2015-12-27 LAB — PROCALCITONIN: Procalcitonin: 0.21 ng/mL

## 2015-12-27 LAB — EXPECTORATED SPUTUM ASSESSMENT W GRAM STAIN, RFLX TO RESP C: Special Requests: NORMAL

## 2015-12-27 LAB — POTASSIUM: Potassium: 5.7 mmol/L — ABNORMAL HIGH (ref 3.5–5.1)

## 2015-12-27 MED ORDER — AMLODIPINE BESYLATE 5 MG PO TABS
5.0000 mg | ORAL_TABLET | Freq: Every day | ORAL | Status: DC
  Administered 2015-12-27 – 2015-12-28 (×2): 5 mg via ORAL
  Filled 2015-12-27 (×2): qty 1

## 2015-12-27 MED ORDER — METHYLPREDNISOLONE SODIUM SUCC 125 MG IJ SOLR
60.0000 mg | INTRAMUSCULAR | Status: DC
  Administered 2015-12-27: 60 mg via INTRAVENOUS
  Filled 2015-12-27: qty 2

## 2015-12-27 NOTE — Progress Notes (Signed)
Nutrition Brief Note  RD consulted via COPD gold protocol.  Patient UBW is between 185-190 lb. Currently has gained fluid weight. Per MD note, needs to diurese.  Pt eating 100% of multiple meals at this time.  Wt Readings from Last 15 Encounters:  12/25/15 199 lb 1.6 oz (90.3 kg)  06/04/15 212 lb 11.2 oz (96.5 kg)  05/02/15 221 lb 1.9 oz (100.3 kg)  09/08/13 193 lb 2 oz (87.6 kg)  08/13/13 191 lb 1.6 oz (86.7 kg)  06/12/13 190 lb (86.2 kg)  04/14/13 190 lb (86.2 kg)  04/07/13 191 lb (86.6 kg)  03/21/13 185 lb (83.9 kg)  03/07/13 185 lb (83.9 kg)  02/13/13 185 lb (83.9 kg)  02/06/13 188 lb (85.3 kg)  02/02/13 188 lb 1.6 oz (85.3 kg)  11/26/12 188 lb 12.8 oz (85.6 kg)  05/06/12 195 lb (88.5 kg)    Body mass index is 31.18 kg/m. Patient meets criteria for obesity based on current BMI.   Current diet order is regular, patient is consuming approximately 100% of meals at this time. Labs and medications reviewed.   No nutrition interventions warranted at this time. If nutrition issues arise, please consult RD.   Clayton Bibles, MS, RD, LDN Pager: [phone] After Hours Pager: [phone]

## 2015-12-27 NOTE — Progress Notes (Signed)
Patient ID: Madeline Dickerson, female   DOB: 05-03-1960, 55 y.o.   MRN: [id number]  PROGRESS NOTE    TANNIA BARAGAN  [id number] DOB: 03-04-1960 DOA: 12/25/2015  PCP: Elyn Peers, MD   Brief Narrative:  55 y.o. female with past medical history significant for chronic pain, hyponatremia, HTN, tobacco abuse, bipolar disorder, prior admissions for acute respiratory failure with hypoxia, history of seasonal asthma. She presented to [facility] ED with worsening shortness of breath over past 2 weeks prior to this admission. She was on z pack and Symbicort given to her outpt but she reports she has had no significant symptomatic relief.  Pt was hemodynamically stable on admission. Her blood work showed WBC count 13.6, hgb 10.2, sodium 125, troponin 0.06. CT angio chest was negative for PE. CXR showed bronchitic changes with chronic interstitial changes versus atypical infection or chronic interstitial disease. She was started on empiric azithro and rocephin for possible pneumonia. She was given solumedrol and nebulizer treatments but continued to be short of breath.    Assessment & Plan:   Active Problems: Acute respiratory failure with hypoxia (HCC) / Acute COPD exacerbation - Stable respiratory status but patient is still very rhonchorous - We will continue DuoNeb 3 times daily scheduled and nebulizer treatments every 2 hours as needed for shortness of breath or wheezing - We will continue Solu-Medrol 60 mg IV but we will reduce frequency from every 12 to every 24 hours - Continue oxygen support via [place] to keep O2 sats above 90%  Lobar pneumonia, unspecified organism / Leukocytosis  - Continue azithromycin and rocephin, empiric treatment  - Blood cx and resp cx not obtained at the time of the admission  Depression, anxiety - Continue Prozac, lyrica, valium  Hyponatremia - Patient has chronic hyponatremia, in April 2017 her sodium level was 126  - Current sodium level 125, around baseline values  -  Urine sodium is within normal limits, urine osmolarity is low and serum osmolarity is low. Her volume status is euvolemic  - TSH is within normal limits. Other than possible pneumonia there is no other acute lung process to account for low sodium. Other option would include psychogenic polydipsia but she denies drinking water in excess of maybe a liter and a half. - We will continue to monitor sodium level daily   Tobacco abuse - Counseled on smoking cessation   Troponin elevation - Mild troponin elevation - No chest pain - Likely demand ischemia from acute infection - No acute ischemic changes on 12 lead EKG - I spoke with cardiology on call, Dr. Stanford Breed who did not recommend further workup. Troponin levels are almost within normal limits and considering patient has no chest pain no further workup required.  Essential hypertension - Blood pressure 146/69 - We will start low-dose Norvasc 5 mg daily  Hyperkalemia - Unclear etiology, she is not on any potassium supplementation and considering she is on nebulizer treatments, would expect that her potassium is low to normal - We will repeat potassium level to exclude the possibility of lab error   DVT prophylaxis: Lovenox subQ  Code Status: full code  Family Communication: no family at the bedside this am  Disposition Plan: home once resp status better, likely in the next day or so   Consultants:   Cardiology, phone call only   Procedures:   2-D echo - grade 2 diastolic dysfunction, ejection fraction 55-60%  Antimicrobials:   Azithromycin and Rocephin 12/25/2015 -->    Subjective: No overnight  events..   Objective: Vitals:   12/26/15 2152 12/27/15 0200 12/27/15 0542 12/27/15 0819  BP: 118/66 118/68 (!) 146/69   Pulse: 75 69 70   Resp: 18 17 18    Temp: 98 F (36.7 C) 98.9 F (37.2 C) 97.9 F (36.6 C)   TempSrc: Oral Oral Oral   SpO2: 92% 94% 97% 98%  Weight:      Height:        Intake/Output Summary (Last 24  hours) at 12/27/15 0844 Last data filed at 12/27/15 0600  Gross per 24 hour  Intake          1888.33 ml  Output              900 ml  Net           988.33 ml   Filed Weights   12/25/15 2106  Weight: 90.3 kg (199 lb 1.6 oz)    Examination:  General exam: No distress, calm and comfortable Respiratory system: rhonchorous and wheezing in upper lung lobes  Cardiovascular system: S1 & S2 heard, rate controlled Gastrointestinal system: Appreciate bowel sounds, nontender abdomen Central nervous system: No focal deficits Extremities: No swelling, pulses palpable Skin: No rashes, lesions or ulcers Psychiatry: Normal mood and behavior  Data Reviewed: I have personally reviewed following labs and imaging studies  CBC:  Recent Labs Lab 12/25/15 1601 12/26/15 0147  WBC 13.6* 15.9*  NEUTROABS 11.7*  --   HGB 10.2* 9.2*  HCT 29.2* 26.8*  MCV 84.4 84.8  PLT 457* [id number]*   Basic Metabolic Panel:  Recent Labs Lab 12/25/15 1601 12/26/15 0147 12/27/15 0523  NA 125* 124* 125*  K 4.6 4.0 5.7*  CL 93* 92* 93*  CO2 20* 24 22  GLUCOSE 101* 175* 127*  BUN 16 13 20   CREATININE 0.80 0.77 0.77  CALCIUM 8.5* 8.3* 8.6*  MG  --  1.6*  --   PHOS  --  2.9  --    GFR: Estimated Creatinine Clearance: 89.5 mL/min (by C-G formula based on SCr of 0.77 mg/dL). Liver Function Tests:  Recent Labs Lab 12/26/15 0147  AST 35  ALT 20  ALKPHOS 86  BILITOT 0.7  PROT 6.9  ALBUMIN 3.0*   No results for input(s): LIPASE, AMYLASE in the last 168 hours. No results for input(s): AMMONIA in the last 168 hours. Coagulation Profile: No results for input(s): INR, PROTIME in the last 168 hours. Cardiac Enzymes:  Recent Labs Lab 12/25/15 2143 12/26/15 0147 12/26/15 0806  TROPONINI 0.06* 0.04* 0.03*   BNP (last 3 results) No results for input(s): PROBNP in the last 8760 hours. HbA1C: No results for input(s): HGBA1C in the last 72 hours. CBG: No results for input(s): GLUCAP in the last 168  hours. Lipid Profile: No results for input(s): CHOL, HDL, LDLCALC, TRIG, CHOLHDL, LDLDIRECT in the last 72 hours. Thyroid Function Tests:  Recent Labs  12/25/15 2143  TSH 3.591   Anemia Panel:  Recent Labs  12/26/15 0147  VITAMINB12 110*  FOLATE 7.4  FERRITIN 271  TIBC 266  IRON 11*  RETICCTPCT 2.1   Urine analysis:    Component Value Date/Time   COLORURINE YELLOW 12/25/2015 1939   APPEARANCEUR CLEAR 12/25/2015 1939   LABSPEC 1.007 12/25/2015 1939   PHURINE 6.0 12/25/2015 1939   GLUCOSEU NEGATIVE 12/25/2015 1939   HGBUR NEGATIVE 12/25/2015 1939   BILIRUBINUR NEGATIVE 12/25/2015 1939   KETONESUR NEGATIVE 12/25/2015 1939   PROTEINUR NEGATIVE 12/25/2015 1939   UROBILINOGEN 0.2 04/28/2014 1635  NITRITE NEGATIVE 12/25/2015 1939   LEUKOCYTESUR NEGATIVE 12/25/2015 1939   Sepsis Labs: @LABRCNTIP (procalcitonin:4,lacticidven:4)   )No results found for this or any previous visit (from the past 240 hour(s)).    Radiology Studies: Dg Chest 2 View Result Date: 12/25/2015 Mild enlargement of cardiac silhouette. Bronchitic changes with chronic interstitial accentuation, question mild edema or atypical infection versus chronic interstitial disease.  Ct Angio Chest Pe W Or Wo Contrast Result Date: 12/26/2015 1. Negative for pulmonary embolism. 2. Smooth interlobular septal thickening, suggesting interstitial fluid. This could represent a mild degree of congestive heart failure. There also is mosaic attenuation which suggests air trapping.   Dg Chest Port 1 View Result Date: 12/25/2015  Increased interstitial prominence bilaterally may reflect interstitial edema or interstitial pneumonia. There is no alveolar pneumonia. When the patient can tolerate the procedure, a PA and lateral chest x-ray would be useful.      Scheduled Meds: . azithromycin  500 mg Intravenous Q24H  . buprenorphine  2 mg Sublingual Daily  . cefTRIAXone (ROCEPHIN) IVPB 1 gram/50 mL D5W  1 g Intravenous  Q24H  . chlorhexidine  15 mL Mouth Rinse BID  . enoxaparin (LOVENOX) injection  40 mg Subcutaneous Q24H  . FLUoxetine  40 mg Oral Daily  . guaiFENesin  600 mg Oral BID  . ipratropium-albuterol  3 mL Nebulization TID  . mouth rinse  15 mL Mouth Rinse q12n4p  . methylPREDNISolone (SOLU-MEDROL) injection  60 mg Intravenous Q12H  . nicotine  21 mg Transdermal Daily  . pantoprazole  40 mg Oral Daily  . pregabalin  300 mg Oral BID  . sodium chloride flush  3 mL Intravenous Q12H  . sodium chloride flush  3 mL Intravenous Q12H   Continuous Infusions:   LOS: 2 days    Time spent: 25 minutes  Greater than 50% of the time spent on counseling and coordinating the care.   Leisa Lenz, MD [facility] Pager [phone]  If 7PM-7AM, please contact night-coverage [email or web address] Password [facility] 12/27/2015, 8:44 AM

## 2015-12-27 NOTE — Progress Notes (Signed)
Name: Madeline Dickerson MRN: [id number] DOB: 1960/04/30    ADMISSION DATE:  12/25/2015 CONSULTATION DATE:  12/26/15  REFERRING MD :  Devine/ TRH  CHIEF COMPLAINT:  Short of breath  BRIEF PATIENT DESCRIPTION: 55 y.o. female smoker with medical history significant of chronic pain, hyponatremia, HTN, tobacco abuse, bipolar disorder, prior admissions for acute respiratory failure with  Hypoxia, history of seasonal asthma  SIGNIFICANT EVENTS    STUDIES:  CTa chest 12/26/15  SUBJECTIVE:  Feeling better. Optimistic she will be ready to go home tomorrow.  VITAL SIGNS: Temp:  [97.3 F (36.3 C)-98.9 F (37.2 C)] 97.9 F (36.6 C) (11/05 0542) Pulse Rate:  [66-75] 70 (11/05 0542) Resp:  [17-20] 18 (11/05 0542) BP: (114-146)/(64-69) 146/69 (11/05 0542) SpO2:  [92 %-98 %] 98 % (11/05 0819)  PHYSICAL EXAMINATION: General:  Obese, alert, pleasant woman lying in bed, roused easily to voice Neuro:  Oriented x 3, cooperative, non-focal HEENT:  Denture missing teeth, mucosa moist, no stridor Cardiovascular: RR, no audible m/g/r. Trace edema feet Lungs:  Distant. Wheeze/ crackles improved from yest., unlabored at rest Abdomen: soft, protuberant, BS+ Musculoskeletal:  Symmetrical strength, normal muscle bulk Skin:  No rash or bruising seen   Recent Labs Lab 12/25/15 1601 12/26/15 0147 12/27/15 0523  NA 125* 124* 125*  K 4.6 4.0 5.7*  CL 93* 92* 93*  CO2 20* 24 22  BUN 16 13 20   CREATININE 0.80 0.77 0.77  GLUCOSE 101* 175* 127*    Recent Labs Lab 12/25/15 1601 12/26/15 0147 12/27/15 0805  HGB 10.2* 9.2* 10.0*  HCT 29.2* 26.8* 29.5*  WBC 13.6* 15.9* 23.7*  PLT 457* 409* 434*   Dg Chest 2 View  Result Date: 12/25/2015 CLINICAL DATA:  Shortness of breath, lightheadedness, diagnosed with bronchitis 1 week ago, history GERD, hypertension, pneumonia, smoker EXAM: CHEST  2 VIEW COMPARISON:  Earlier 12/25/2015 portable chest radiograph and an earlier exam of 06/01/2015 FINDINGS:  Enlargement of cardiac silhouette. Mediastinal contours and pulmonary vascularity normal. Diffuse interstitial infiltrates appear little changed since 06/01/2015, could represent mild recurrent edema, atypical infection, or underlying chronic interstitial disease. No segmental consolidation, pleural effusion or pneumothorax. Mild central peribronchial thickening. Bones demineralized with scattered endplate spur formation thoracic spine. IMPRESSION: Mild enlargement of cardiac silhouette. Bronchitic changes with chronic interstitial accentuation, question mild edema or atypical infection versus chronic interstitial disease. Electronically Signed   By: Lavonia Dana M.D.   On: 12/25/2015 19:56   Ct Angio Chest Pe W Or Wo Contrast  Result Date: 12/26/2015 CLINICAL DATA:  Dyspnea for 1 week. EXAM: CT ANGIOGRAPHY CHEST WITH CONTRAST TECHNIQUE: Multidetector CT imaging of the chest was performed using the standard protocol during bolus administration of intravenous contrast. Multiplanar CT image reconstructions and MIPs were obtained to evaluate the vascular anatomy. CONTRAST:  100 mL Isovue 370 intravenous COMPARISON:  Radiographs 12/25/2015 and prior studies dating back to 11/05/2014 FINDINGS: Cardiovascular: Satisfactory opacification of the pulmonary arteries to the segmental level. No evidence of pulmonary embolism. Normal heart size. No pericardial effusion. Mediastinum/Nodes: No enlarged mediastinal, hilar, or axillary lymph nodes. Thyroid gland, trachea, and esophagus demonstrate no significant findings. Lungs/Pleura: There is smooth thickening of the interlobular septa, suggesting interstitial fluid. There is mosaic attenuation throughout both lungs. This may represent air trapping and perhaps a minor degree of alveolar edema. No pleural effusions. Airways are patent. Upper Abdomen: No significant abnormality. Musculoskeletal: No significant skeletal lesion. Review of the MIP images confirms the above findings.  IMPRESSION: 1. Negative for pulmonary  embolism. 2. Smooth interlobular septal thickening, suggesting interstitial fluid. This could represent a mild degree of congestive heart failure. There also is mosaic attenuation which suggests air trapping. Electronically Signed   By: Andreas Newport M.D.   On: 12/26/2015 06:20   Dg Chest Port 1 View  Result Date: 12/25/2015 CLINICAL DATA:  Progressive worsening of shortness of breath over the past week. History of seasonal asthma and episodes of pneumonia. Current smoker. EXAM: PORTABLE CHEST 1 VIEW COMPARISON:  PA and lateral chest x-ray of June 01, 2015 FINDINGS: The lungs are well-expanded. The pulmonary interstitial markings are increased diffusely and overall are more conspicuous than on the previous study. The cardiac silhouette is enlarged but not greatly changed from the previous study. The pulmonary vascularity is mildly engorged and indistinct. The mediastinum is normal in width. There is no pleural effusion. The bony thorax exhibits no acute abnormality. IMPRESSION: Increased interstitial prominence bilaterally may reflect interstitial edema or interstitial pneumonia. There is no alveolar pneumonia. When the patient can tolerate the procedure, a PA and lateral chest x-ray would be useful. Electronically Signed   By: David  Martinique M.D.   On: 12/25/2015 16:33    ASSESSMENT / PLAN:  COPD mixed type with acute exacerbation.- No PFT available but this chronic smoker has patchy lucency on CT c/w emphysema. Previously had samples of Symbicort and albuterol HFA she remembers as helpful. She associates exacerbations with season changes and viral infections. History suggests viral tracheobronchitis syndrome triggered this time. There is an interstitial edema component and mild cardiomegaly. Improving as of 11/5  P- recommend change to po prednisone. Wants rescue inhaler for home use.  Tobacco abuse- Counseling initiated. More sustained outpatient effort  required.  Anemia- undefined. This will aggravate dyspnea esp w exertion  Hyponatremia- She needs to lose some water weight, but tends to hyponatremia. Try fluid restriction while here, for assessment. 11/5- holding Na at 125. She needs adequate salt intake to permit light chronic diuresis  Recommend- continue present meds. Taper steroids as able, watching for steroid mental health impact.                         -Labs to track electrolytes, BNP   Pulmonary and [place] Pager: [phone]  12/27/2015, 10:45 AM

## 2015-12-28 DIAGNOSIS — E875 Hyperkalemia: Secondary | ICD-10-CM

## 2015-12-28 DIAGNOSIS — J9601 Acute respiratory failure with hypoxia: Principal | ICD-10-CM

## 2015-12-28 DIAGNOSIS — F3162 Bipolar disorder, current episode mixed, moderate: Secondary | ICD-10-CM

## 2015-12-28 LAB — BASIC METABOLIC PANEL
Anion gap: 13 (ref 5–15)
BUN: 18 mg/dL (ref 6–20)
CO2: 18 mmol/L — ABNORMAL LOW (ref 22–32)
Calcium: 8.8 mg/dL — ABNORMAL LOW (ref 8.9–10.3)
Chloride: 99 mmol/L — ABNORMAL LOW (ref 101–111)
Creatinine, Ser: 0.88 mg/dL (ref 0.44–1.00)
GFR calc Af Amer: >60 mL/min (ref >60)
GFR calc non Af Amer: >60 mL/min (ref >60)
Glucose, Bld: 143 mg/dL — ABNORMAL HIGH (ref 65–99)
Potassium: 5.3 mmol/L — ABNORMAL HIGH (ref 3.5–5.1)
Sodium: 130 mmol/L — ABNORMAL LOW (ref 135–145)

## 2015-12-28 LAB — CBC
HCT: 33.2 % — ABNORMAL LOW (ref 36.0–46.0)
Hemoglobin: 11.1 g/dL — ABNORMAL LOW (ref 12.0–15.0)
MCH: 30.1 pg (ref 26.0–34.0)
MCHC: 33.4 g/dL (ref 30.0–36.0)
MCV: 90 fL (ref 78.0–100.0)
Platelets: 291 10*3/uL (ref 150–400)
RBC: 3.69 MIL/uL — ABNORMAL LOW (ref 3.87–5.11)
RDW: 17.3 % — ABNORMAL HIGH (ref 11.5–15.5)
WBC: 21.1 10*3/uL — ABNORMAL HIGH (ref 4.0–10.5)

## 2015-12-28 LAB — ANTINUCLEAR ANTIBODIES, IFA: ANA Ab, IFA: NEGATIVE

## 2015-12-28 MED ORDER — PREDNISONE 20 MG PO TABS
40.0000 mg | ORAL_TABLET | Freq: Every day | ORAL | Status: DC
  Administered 2015-12-28: 40 mg via ORAL
  Filled 2015-12-28: qty 2

## 2015-12-28 MED ORDER — TIOTROPIUM BROMIDE MONOHYDRATE 18 MCG IN CAPS: 18.0000 ug | ORAL_CAPSULE | Freq: Every morning | RESPIRATORY_TRACT | 3 refills | Status: AC

## 2015-12-28 MED ORDER — PREDNISONE 20 MG PO TABS
40.0000 mg | ORAL_TABLET | Freq: Every day | ORAL | 0 refills | Status: DC
Start: 2015-12-29 — End: 2015-12-28

## 2015-12-28 MED ORDER — NICOTINE 14 MG/24HR TD PT24: 14.0000 mg | MEDICATED_PATCH | Freq: Every day | TRANSDERMAL | 0 refills | Status: DC

## 2015-12-28 MED ORDER — FUROSEMIDE 10 MG/ML IJ SOLN: 20.0000 mg | Freq: Once | INTRAMUSCULAR | Status: DC

## 2015-12-28 MED ORDER — BUDESONIDE-FORMOTEROL FUMARATE 80-4.5 MCG/ACT IN AERO: 2.0000 | INHALATION_SPRAY | Freq: Two times a day (BID) | RESPIRATORY_TRACT | 3 refills | Status: DC

## 2015-12-28 MED ORDER — PREDNISONE 20 MG PO TABS: 40.0000 mg | ORAL_TABLET | Freq: Every day | ORAL | 0 refills | Status: AC

## 2015-12-28 MED ORDER — ALBUTEROL SULFATE HFA 108 (90 BASE) MCG/ACT IN AERS: 2.0000 | INHALATION_SPRAY | Freq: Four times a day (QID) | RESPIRATORY_TRACT | 3 refills | Status: AC | PRN

## 2015-12-28 NOTE — Progress Notes (Signed)
CSW received consult for COPD Gold Protocol, though patient does not meet qualifications (only 1 admission within the past 6 months).   CSW signing off.   Feiga Nadel, LCSW [facility] Clinical Social Worker cell #: [phone]    

## 2015-12-28 NOTE — Progress Notes (Signed)
PT Cancellation Note  Patient Details Name: Madeline Dickerson MRN: [id number] DOB: [birth year]   Cancelled Treatment:    Reason Eval/Treat Not Completed: Pt declined participation with PT. Pt reports she feels better and she is planning to d/c home on today. Pt declines HHPT follow up as well.    Weston Anna, MPT Pager: [phone]

## 2015-12-28 NOTE — Progress Notes (Signed)
   Name: Madeline Dickerson MRN: [id number] DOB: Nov 07, 1960    ADMISSION DATE:  12/25/2015 CONSULTATION DATE:  12/26/15  REFERRING MD :  Devine/ TRH  CHIEF COMPLAINT:  Short of breath  BRIEF PATIENT DESCRIPTION: 55 y.o. female smoker with medical history significant of chronic pain, hyponatremia, HTN, tobacco abuse, bipolar disorder, prior admissions for acute respiratory failure with hypoxia, history of seasonal asthma admitted 11/1 with AECOPD.    SUBJECTIVE:    VITAL SIGNS: Temp:  [97.5 F (36.4 C)-99 F (37.2 C)] 97.5 F (36.4 C) (11/06 0548) Pulse Rate:  [64-79] 64 (11/06 0548) Resp:  [16-18] 18 (11/06 0548) BP: (97-131)/(55-80) 131/80 (11/06 0548) SpO2:  [94 %-97 %] 94 % (11/06 0806) FiO2 (%):  [21 %] 21 % (11/06 0806)  PHYSICAL EXAMINATION: General:  Obese, alert, pleasant woman lying in bed, roused easily to voice Neuro:  Oriented x 3, cooperative, non-focal HEENT:  Denture missing teeth, mucosa moist, no stridor Cardiovascular: RR, no audible m/g/r. Trace edema feet Lungs:  Distant. Wheeze/ crackles improved from yest., unlabored at rest Abdomen: soft, protuberant, BS+ Musculoskeletal:  Symmetrical strength, normal muscle bulk Skin:  No rash or bruising seen   Recent Labs Lab 12/26/15 0147 12/27/15 0523 12/27/15 1004 12/28/15 0330  NA 124* 125*  --  130*  K 4.0 5.7* 5.7* 5.3*  CL 92* 93*  --  99*  CO2 24 22  --  18*  BUN 13 20  --  18  CREATININE 0.77 0.77  --  0.88  GLUCOSE 175* 127*  --  143*    Recent Labs Lab 12/26/15 0147 12/27/15 0805 12/28/15 0330  HGB 9.2* 10.0* 11.1*  HCT 26.8* 29.5* 33.2*  WBC 15.9* 23.7* 21.1*  PLT 409* 434* 291   No results found.  SIGNIFICANT EVENTS  11/3  Admit with AECOPD  STUDIES:  CTA Chest 11/4 >> negative for PE, smooth interlobular septal thickening (?interstitial fluid)   ASSESSMENT / PLAN:  COPD mixed type with acute exacerbation.- No PFT available but this chronic smoker has patchy lucency on CT c/w  emphysema. Previously had samples of Symbicort and albuterol HFA she remembers as helpful. She associates exacerbations with season changes and viral infections. History suggests viral tracheobronchitis syndrome triggered this time.  There is an interstitial edema component and mild cardiomegaly.  Plan: Continue Prednisone, 40 mg QD with slow taper to off over 7-10 days Will need PRN albuterol Rx at discharge Abx as above, D4/5  Duoneb Q6 Pulmonary hygiene -IS, mobilize Follow up with pulmonary at discharge, appt arranged Consider ouptatient PFT's at follow up  Tobacco abuse - Counseling initiated. More sustained outpatient effort required.  Nicotine patch.   Anemia - undefined. This will aggravate dyspnea esp w exertion  Hyponatremia - improved, Na 130, on HCTZ at home  PCCM will sign off, please call back if new needs arise.   Noe Gens, NP-C [facility] Pgr: [phone] or if no answer [phone] 12/28/2015, 9:47 AM

## 2015-12-28 NOTE — Progress Notes (Signed)
CSW was contacted for aid to transportation for patient via taxi cab. CSW met with patient to confirm with travel plans.  CSW called [employer] (ph#: [phone]) and completed taxi cab voucher - given to RN, Grace to give to taxi driver.   CSW signing off.    , LCSW [facility] Clinical Social Worker cell #: [phone]     

## 2015-12-28 NOTE — Discharge Instructions (Signed)
Chronic Obstructive Pulmonary Disease °Chronic obstructive pulmonary disease (COPD) is a common lung problem. In COPD, the flow of air from the lungs is limited. The way your lungs work will probably never return to normal, but there are things you can do to improve your lungs and make yourself feel better. Your doctor may treat your condition with: °· Medicines. °· Oxygen. °· Lung surgery. °· Changes to your diet. °· Rehabilitation. This may involve a team of specialists. °HOME CARE °· Take all medicines as told by your doctor. °· Avoid medicines or cough syrups that dry up your airway (such as antihistamines) and do not allow you to get rid of thick spit. You do not need to avoid them if told differently by your doctor. °· If you smoke, stop. Smoking makes the problem worse. °· Avoid being around things that make your breathing worse (like smoke, chemicals, and fumes). °· Use oxygen therapy and therapy to help improve your lungs (pulmonary rehabilitation) if told by your doctor. If you need home oxygen therapy, ask your doctor if you should buy a tool to measure your oxygen level (oximeter). °· Avoid people who have a sickness you can catch (contagious). °· Avoid going outside when it is very hot, cold, or humid. °· Eat healthy foods. Eat smaller meals more often. Rest before meals. °· Stay active, but remember to also rest. °· Make sure to get all the shots (vaccines) your doctor recommends. Ask your doctor if you need a pneumonia shot. °· Learn and use tips on how to relax. °· Learn and use tips on how to control your breathing as told by your doctor. Try: °¨ Breathing in (inhaling) through your nose for 1 second. Then, pucker your lips and breath out (exhale) through your lips for 2 seconds. °¨ Putting one hand on your belly (abdomen). Breathe in slowly through your nose for 1 second. Your hand on your belly should move out. Pucker your lips and breathe out slowly through your lips. Your hand on your belly  should move in as you breathe out. °· Learn and use controlled coughing to clear thick spit from your lungs. The steps are: °1. Lean your head a little forward. °2. Breathe in deeply. °3. Try to hold your breath for 3 seconds. °4. Keep your mouth slightly open while coughing 2 times. °5. Spit any thick spit out into a tissue. °6. Rest and do the steps again 1 or 2 times as needed. °GET HELP IF: °· You cough up more thick spit than usual. °· There is a change in the color or thickness of the spit. °· It is harder to breathe than usual. °· Your breathing is faster than usual. °GET HELP RIGHT AWAY IF: °· You have shortness of breath while resting. °· You have shortness of breath that stops you from: °¨ Being able to talk. °¨ Doing normal activities. °· You chest hurts for longer than 5 minutes. °· Your skin color is more blue than usual. °· Your pulse oximeter shows that you have low oxygen for longer than 5 minutes. °MAKE SURE YOU: °· Understand these instructions. °· Will watch your condition. °· Will get help right away if you are not doing well or get worse. °  °This information is not intended to replace advice given to you by your health care provider. Make sure you discuss any questions you have with your health care provider. °  °Document Released: 07/27/2007 Document Revised: 02/28/2014 Document Reviewed: 10/04/2012 °Elsevier Interactive Patient   Education ©2016 [employer] ° °

## 2015-12-28 NOTE — Discharge Summary (Addendum)
Physician Discharge Summary  Madeline Dickerson [id number] DOB: October 14, 1960 DOA: 12/25/2015  PCP: Madeline Peers, MD  Admit date: 12/25/2015 Discharge date: 12/28/2015  Admit date: 12/25/2015 Discharge date: 12/28/2015  Admitted From: Home Disposition:  Home  Recommendations for Outpatient Follow-up:  1. Follow up with PCP in 1-2 weeks 2. Please obtain BMET ( check Na and k),HCTZ and valsartan discontinued due to hyponatremia and hyperkalemia. Patient started on low-dose amlodipine. 3. Follow-up with pulmonary as outpatient.  Home Health: None Equipment/Devices: None  Discharge Condition: Fair CODE STATUS: Full code Diet recommendation: Low sodium    Discharge Diagnoses:   Principal problem   COPD exacerbation ([place])  Active Problems:   Hypertension   Tobacco abuse   Acute respiratory failure with hypoxia (HCC)   Hyponatremia, severe   Bipolar 1 disorder, mixed, moderate (HCC)   Anemia   Shortness of breath   Cardiomegaly   Hyperkalemia  Brief narrative/history of present illness 55 y.o.femalewith past medical history significant forchronic pain, hyponatremia, HTN, tobacco abuse, bipolar disorder, prior admissions for acute respiratory failure with hypoxia, history of seasonal asthma., presented to [facility] ED with worsening shortness of breath over past 2 weeks prior to this admission. She was on z pack and Symbicort given to her outpt but she reports she has had no significant symptomatic relief.  Pt was hemodynamically stable on admission. Her blood work showed WBC count 13.6, hgb 10.2, sodium 125, troponin 0.06. CT angio chest was negative for PE. CXR showed bronchitic changes with chronic interstitial changes versus atypical infection or chronic interstitial disease. She was started on empiric azithro and rocephin for possible pneumonia. She was given solumedrol and nebulizer treatments but continued to be short of breath. Patient admitted to hospitalist service.  Hospital  course Acute hypoxic respiratory failure (HCC)  secondary to acute COPD exacerbation. Treated with IV Solu-Medrol, scheduled DuoNeb's and supplemental oxygen along with antitussives. Placed on empiric Rocephin and azithromycin with concern for possible lobar pneumonia.   symptoms much improved. Maintaining O2 sat on room air and has minimal wheezing. -Patient will be discharged on oral prednisone for 5 more days. Will prescribe her albuterol inhaler as needed, daily Spiriva inhaler and Symbicort inhaler twice a day. (Patient reports being on Symbicort inhaler twice a day and albuterol inhaler as needed at home). --Pulmonary consult appreciated. Plan on outpatient PFT. -Counseled strongly on smoking cessation. Will prescribe nicotine patch. Follow-up with PCP and pulmonary as outpatient.   ? Lobar pneumonia Was placed on empiric Rocephin and azithromycin. CT angiogram done on admission was negative for PE. Likely has atelectasis and symptoms from COPD exacerbation. Symptoms unlikely for pneumonia. Will discontinue antibiotics.  Hyponatremia Seems to be chronic. Patient is on HCTZ which I will discontinue. Blood pressure is currently stable. I will start her on low-dose amlodipine as outpatient.  Hyperkalemia Suspected to valsartan and albuterol nebs. Have discontinued it. Potassium of 5.3 today. Needs follow-up as outpatient.  switching to low-dose amlodipine.  Essential hypertension Chart on low-dose amlodipine. Discontinue HCTZ and valsartan.  Anxiety and depression Continue home medications  Elevated troponin level Mild. No chest pain symptoms or EKG changes. Possibly amend ischemia. 2-D echo with normal EF and grade 2 diastolic dysfunction. Mild pulmonary artery hypertension.  Tobacco abuse Counseled strongly on cessation. Nicotine patch prescribed.  Family communication: None at bedside  Disposition: Home  Discharge Instructions     Medication List    STOP taking these  medications   azithromycin 250 MG tablet Commonly known as:  [id number]  folic acid 1 MG tablet Commonly known as:  FOLVITE   valsartan 160 MG tablet Commonly known as:  DIOVAN   valsartan-hydrochlorothiazide 160-12.5 MG tablet Commonly known as:  DIOVAN-HCT     TAKE these medications   albuterol 108 (90 Base) MCG/ACT inhaler Commonly known as:  PROVENTIL HFA;VENTOLIN HFA Inhale 2 puffs into the lungs every 6 (six) hours as needed for wheezing or shortness of breath.  Amlodipine 5 mg tablet Take 1 tablet (5 mg) mouth daily   budesonide-formoterol 80-4.5 MCG/ACT inhaler Commonly known as:  SYMBICORT Inhale 2 puffs into the lungs 2 (two) times daily.   diazepam 10 MG tablet Commonly known as:  VALIUM Take 10 mg by mouth 2 (two) times daily as needed for anxiety.   FLUoxetine 40 MG capsule Commonly known as:  PROZAC Take 1 capsule (40 mg total) by mouth daily.   LYRICA 300 MG capsule Generic drug:  pregabalin Take 300 mg by mouth 2 (two) times daily.   nicotine 14 mg/24hr patch Commonly known as:  NICODERM CQ - dosed in mg/24 hours Place 1 patch (14 mg total) onto the skin daily. Start taking on:  12/29/2015   omeprazole 40 MG capsule Commonly known as:  PRILOSEC Take 40 mg by mouth daily.   predniSONE 20 MG tablet Commonly known as:  DELTASONE Take 2 tablets (40 mg total) by mouth daily with breakfast. Start taking on:  12/29/2015 until 11/12   promethazine 25 MG tablet Commonly known as:  PHENERGAN Take 25 mg by mouth every 6 (six) hours as needed for nausea/vomiting.   SUBOXONE 8-2 MG Film Generic drug:  Buprenorphine HCl-Naloxone HCl Place 1 Film under the tongue 2 (two) times daily.   tiotropium 18 MCG inhalation capsule Commonly known as:  SPIRIVA HANDIHALER Place 1 capsule (18 mcg total) into inhaler and inhale every morning.      Follow-up Information    Madeline Spatz, NP Follow up on 01/04/2016.   Specialty:  Pulmonary Disease Why:  Appt at  11:00 AM  Contact information: [address] [place] [address] [phone]        Madeline Peers, MD. Schedule an appointment as soon as possible for a visit in 1 week(s).   Specialty:  Family Medicine Contact information: [address] [place] [address] [phone]          Allergies  Allergen Reactions  . Haldol [Haloperidol Lactate] Other (See Comments)    Couldn't talk, couldn't move   . Naproxen Nausea And Vomiting    Consultations: Pulmonary  Procedures/Studies: Dg Chest 2 View  Result Date: 12/25/2015 CLINICAL DATA:  Shortness of breath, lightheadedness, diagnosed with bronchitis 1 week ago, history GERD, hypertension, pneumonia, smoker EXAM: CHEST  2 VIEW COMPARISON:  Earlier 12/25/2015 portable chest radiograph and an earlier exam of 06/01/2015 FINDINGS: Enlargement of cardiac silhouette. Mediastinal contours and pulmonary vascularity normal. Diffuse interstitial infiltrates appear little changed since 06/01/2015, could represent mild recurrent edema, atypical infection, or underlying chronic interstitial disease. No segmental consolidation, pleural effusion or pneumothorax. Mild central peribronchial thickening. Bones demineralized with scattered endplate spur formation thoracic spine. IMPRESSION: Mild enlargement of cardiac silhouette. Bronchitic changes with chronic interstitial accentuation, question mild edema or atypical infection versus chronic interstitial disease. Electronically Signed   By: Lavonia Dana M.D.   On: 12/25/2015 19:56   Ct Angio Chest Pe W Or Wo Contrast  Result Date: 12/26/2015 CLINICAL DATA:  Dyspnea for 1 week. EXAM: CT ANGIOGRAPHY CHEST  WITH CONTRAST TECHNIQUE: Multidetector CT imaging of the chest was performed using the standard protocol during bolus administration of intravenous contrast. Multiplanar CT image reconstructions and MIPs were obtained to evaluate the vascular anatomy. CONTRAST:  100 mL Isovue 370  intravenous COMPARISON:  Radiographs 12/25/2015 and prior studies dating back to 11/05/2014 FINDINGS: Cardiovascular: Satisfactory opacification of the pulmonary arteries to the segmental level. No evidence of pulmonary embolism. Normal heart size. No pericardial effusion. Mediastinum/Nodes: No enlarged mediastinal, hilar, or axillary lymph nodes. Thyroid gland, trachea, and esophagus demonstrate no significant findings. Lungs/Pleura: There is smooth thickening of the interlobular septa, suggesting interstitial fluid. There is mosaic attenuation throughout both lungs. This may represent air trapping and perhaps a minor degree of alveolar edema. No pleural effusions. Airways are patent. Upper Abdomen: No significant abnormality. Musculoskeletal: No significant skeletal lesion. Review of the MIP images confirms the above findings. IMPRESSION: 1. Negative for pulmonary embolism. 2. Smooth interlobular septal thickening, suggesting interstitial fluid. This could represent a mild degree of congestive heart failure. There also is mosaic attenuation which suggests air trapping. Electronically Signed   By: Andreas Newport M.D.   On: 12/26/2015 06:20   Dg Chest Port 1 View  Result Date: 12/25/2015 CLINICAL DATA:  Progressive worsening of shortness of breath over the past week. History of seasonal asthma and episodes of pneumonia. Current smoker. EXAM: PORTABLE CHEST 1 VIEW COMPARISON:  PA and lateral chest x-ray of June 01, 2015 FINDINGS: The lungs are well-expanded. The pulmonary interstitial markings are increased diffusely and overall are more conspicuous than on the previous study. The cardiac silhouette is enlarged but not greatly changed from the previous study. The pulmonary vascularity is mildly engorged and indistinct. The mediastinum is normal in width. There is no pleural effusion. The bony thorax exhibits no acute abnormality. IMPRESSION: Increased interstitial prominence bilaterally may reflect  interstitial edema or interstitial pneumonia. There is no alveolar pneumonia. When the patient can tolerate the procedure, a PA and lateral chest x-ray would be useful. Electronically Signed   By: David  Martinique M.D.   On: 12/25/2015 16:33    2-D echo Study Conclusions  - Left ventricle: The cavity size was normal. Wall thickness was   normal. Systolic function was normal. The estimated ejection   fraction was in the range of 55% to 60%. Wall motion was normal;   there were no regional wall motion abnormalities. Features are   consistent with a pseudonormal left ventricular filling pattern,   with concomitant abnormal relaxation and increased filling   pressure (grade 2 diastolic dysfunction). - Aortic valve: There was mild regurgitation. - Ascending aorta: The ascending aorta was mildly dilated. - Left atrium: The atrium was mildly dilated. - Pulmonary arteries: Systolic pressure was mildly increased. PA   peak pressure: 39 mm Hg (S).  Impressions:  - Normal LV systolic function; grade 2 diastolic dysfunction; mild   LAE; mild AI; trace TR; mildly elevated pulmonary pressure.  Subjective: Denies further shortness of breath, cough or wheezing.  Discharge Exam: Vitals:   12/27/15 2123 12/28/15 0548  BP: 97/77 131/80  Pulse: 76 64  Resp: 18 18  Temp: 99 F (37.2 C) 97.5 F (36.4 C)   Vitals:   12/27/15 1947 12/27/15 2123 12/28/15 0548 12/28/15 0806  BP:  97/77 131/80   Pulse: 79 76 64   Resp: 18 18 18    Temp:  99 F (37.2 C) 97.5 F (36.4 C)   TempSrc:  Oral Oral   SpO2: 94% 97% 94% 94%  Weight:      Height:        General: Middle aged female not in distress HEENT: No pallor, moist mucosa Cardiovascular: Normal S1 and S2, no murmurs rub or gallop Respiratory: Fine expiratory rhonchi on left side Abdominal: Soft, NT, ND Extremities: Warm, no edema    The results of significant diagnostics from this hospitalization (including imaging, microbiology, ancillary  and laboratory) are listed below for reference.     Microbiology: Recent Results (from the past 240 hour(s))  Culture, sputum-assessment     Status: None   Collection Time: 12/27/15  2:09 PM  Result Value Ref Range Status   Specimen Description SPUTUM  Final   Special Requests Normal  Final   Sputum evaluation   Final    THIS SPECIMEN IS ACCEPTABLE. RESPIRATORY CULTURE REPORT TO FOLLOW.   Report Status 12/27/2015 FINAL  Final  Culture, respiratory (NON-Expectorated)     Status: None (Preliminary result)   Collection Time: 12/27/15  2:10 PM  Result Value Ref Range Status   Specimen Description SPUTUM  Final   Special Requests NONE  Final   Gram Stain   Final    MODERATE WBC PRESENT,BOTH PMN AND MONONUCLEAR RARE SQUAMOUS EPITHELIAL CELLS PRESENT FEW GRAM POSITIVE COCCI IN PAIRS RARE BUDDING YEAST SEEN Performed at [facility]    Culture PENDING  Incomplete   Report Status PENDING  Incomplete     Labs: BNP (last 3 results)  Recent Labs  04/30/15 2007 06/01/15 2303 12/25/15 1601  BNP 240.6* 62.3 123XX123*   Basic Metabolic Panel:  Recent Labs Lab 12/25/15 1601 12/26/15 0147 12/27/15 0523 12/27/15 1004 12/28/15 0330  NA 125* 124* 125*  --  130*  K 4.6 4.0 5.7* 5.7* 5.3*  CL 93* 92* 93*  --  99*  CO2 20* 24 22  --  18*  GLUCOSE 101* 175* 127*  --  143*  BUN 16 13 20   --  18  CREATININE 0.80 0.77 0.77  --  0.88  CALCIUM 8.5* 8.3* 8.6*  --  8.8*  MG  --  1.6*  --   --   --   PHOS  --  2.9  --   --   --    Liver Function Tests:  Recent Labs Lab 12/26/15 0147  AST 35  ALT 20  ALKPHOS 86  BILITOT 0.7  PROT 6.9  ALBUMIN 3.0*   No results for input(s): LIPASE, AMYLASE in the last 168 hours. No results for input(s): AMMONIA in the last 168 hours. CBC:  Recent Labs Lab 12/25/15 1601 12/26/15 0147 12/27/15 0805 12/28/15 0330  WBC 13.6* 15.9* 23.7* 21.1*  NEUTROABS 11.7*  --   --   --   HGB 10.2* 9.2* 10.0* 11.1*  HCT 29.2* 26.8* 29.5* 33.2*   MCV 84.4 84.8 87.0 90.0  PLT 457* 409* 434* 291   Cardiac Enzymes:  Recent Labs Lab 12/25/15 2143 12/26/15 0147 12/26/15 0806  TROPONINI 0.06* 0.04* 0.03*   BNP: Invalid input(s): POCBNP CBG: No results for input(s): GLUCAP in the last 168 hours. D-Dimer  Recent Labs  12/25/15 2143  DDIMER 3.06*   Hgb A1c  Recent Labs  12/26/15 0147  HGBA1C 6.0*   Lipid Profile No results for input(s): CHOL, HDL, LDLCALC, TRIG, CHOLHDL, LDLDIRECT in the last 72 hours. Thyroid function studies  Recent Labs  12/25/15 2143  TSH 3.591   Anemia work up  Recent Labs  12/26/15 0147  VITAMINB12 110*  FOLATE 7.4  FERRITIN  271  TIBC 266  IRON 11*  RETICCTPCT 2.1   Urinalysis    Component Value Date/Time   COLORURINE YELLOW 12/25/2015 1939   APPEARANCEUR CLEAR 12/25/2015 1939   LABSPEC 1.007 12/25/2015 1939   PHURINE 6.0 12/25/2015 1939   GLUCOSEU NEGATIVE 12/25/2015 1939   HGBUR NEGATIVE 12/25/2015 1939   BILIRUBINUR NEGATIVE 12/25/2015 1939   KETONESUR NEGATIVE 12/25/2015 1939   PROTEINUR NEGATIVE 12/25/2015 1939   UROBILINOGEN 0.2 04/28/2014 1635   NITRITE NEGATIVE 12/25/2015 1939   LEUKOCYTESUR NEGATIVE 12/25/2015 1939   Sepsis Labs Invalid input(s): PROCALCITONIN,  WBC,  LACTICIDVEN Microbiology Recent Results (from the past 240 hour(s))  Culture, sputum-assessment     Status: None   Collection Time: 12/27/15  2:09 PM  Result Value Ref Range Status   Specimen Description SPUTUM  Final   Special Requests Normal  Final   Sputum evaluation   Final    THIS SPECIMEN IS ACCEPTABLE. RESPIRATORY CULTURE REPORT TO FOLLOW.   Report Status 12/27/2015 FINAL  Final  Culture, respiratory (NON-Expectorated)     Status: None (Preliminary result)   Collection Time: 12/27/15  2:10 PM  Result Value Ref Range Status   Specimen Description SPUTUM  Final   Special Requests NONE  Final   Gram Stain   Final    MODERATE WBC PRESENT,BOTH PMN AND MONONUCLEAR RARE SQUAMOUS  EPITHELIAL CELLS PRESENT FEW GRAM POSITIVE COCCI IN PAIRS RARE BUDDING YEAST SEEN Performed at [facility]    Culture PENDING  Incomplete   Report Status PENDING  Incomplete     Time coordinating discharge: Over 30 minutes  SIGNED:   Louellen Molder, MD  [facility] 12/28/2015, 11:06 AM Pager   If 7PM-7AM, please contact night-coverage [email or web address] Password [email or web address]

## 2015-12-28 NOTE — Progress Notes (Signed)
Pt states that she will not need HHPT at present time. However, need a taxi to go home.  Referral given to CSW.

## 2015-12-30 ENCOUNTER — Ambulatory Visit (INDEPENDENT_AMBULATORY_CARE_PROVIDER_SITE_OTHER): Payer: Medicare Other | Admitting: Orthopedic Surgery

## 2015-12-30 ENCOUNTER — Encounter (INDEPENDENT_AMBULATORY_CARE_PROVIDER_SITE_OTHER): Payer: Self-pay | Admitting: Orthopedic Surgery

## 2015-12-30 VITALS — Ht 67.0 in | Wt 199.0 lb

## 2015-12-30 DIAGNOSIS — M7542 Impingement syndrome of left shoulder: Secondary | ICD-10-CM

## 2015-12-30 LAB — CULTURE, RESPIRATORY

## 2015-12-30 LAB — CULTURE, RESPIRATORY W GRAM STAIN: Culture: NORMAL

## 2015-12-30 MED ORDER — DIAZEPAM 10 MG PO TABS: 10.0000 mg | ORAL_TABLET | Freq: Two times a day (BID) | ORAL | 0 refills | Status: DC | PRN

## 2015-12-30 MED ORDER — METHYLPREDNISOLONE ACETATE 40 MG/ML IJ SUSP
40.0000 mg | INTRAMUSCULAR | Status: AC | PRN
  Administered 2015-12-30: 40 mg via INTRA_ARTICULAR

## 2015-12-30 MED ORDER — LIDOCAINE HCL 1 % IJ SOLN
5.0000 mL | INTRAMUSCULAR | Status: AC | PRN
  Administered 2015-12-30: 5 mL

## 2015-12-30 NOTE — Progress Notes (Signed)
Office Visit Note   Patient: Madeline Dickerson           Date of Birth: April 22, 1960           MRN: [id number] Visit Date: 12/30/2015              Requested by: Lucianne Lei, MD [address] [place], [place] [address] PCP: Elyn Peers, MD   Assessment & Plan: Visit Diagnoses: No diagnosis found.  Plan: Have provided a refill on her Valium which she states is helpful for her intermittent back pain. sHe will follow up in the office as needed for impingement of the left shoulder.  Follow-Up Instructions: Return if symptoms worsen or fail to improve.   Orders:  Orders Placed This Encounter  Procedures  . Large Joint Injection/Arthrocentesis   Meds ordered this encounter  Medications  . diazepam (VALIUM) 10 MG tablet    Sig: Take 1 tablet (10 mg total) by mouth 2 (two) times daily as needed for anxiety.    Dispense:  60 tablet    Refill:  0      Procedures: Large Joint Inj Date/Time: 12/30/2015 4:06 PM Performed by: DUDA, MARCUS V Authorized by: Newt Minion   Consent Given by:  Patient Site marked: the procedure site was marked   Timeout: prior to procedure the correct patient, procedure, and site was verified   Indications:  Pain and diagnostic evaluation Location:  Shoulder Site:  L subacromial bursa Prep: patient was prepped and draped in usual sterile fashion   Needle Size:  22 G Needle Length:  1.5 inches Ultrasound Guidance: No   Fluoroscopic Guidance: No   Arthrogram: No Medications:  5 mL lidocaine 1 %; 40 mg methylPREDNISolone acetate 40 MG/ML Aspiration Attempted: No   Patient tolerance:  Patient tolerated the procedure well with no immediate complications      Clinical Data: No additional findings.   Subjective: Chief Complaint  Patient presents with  . Left Shoulder - Pain    Patient is s/p an injection in her left shoulder 09/25/15. She states that these are very helpful but has worn off and she would like another injection today.  Complaints of left shoulder pain, decreased ROM  Is been having increased impingement pain and dull aching at night. Moderate relief with ibuprofen.  Review of Systems  Constitutional: Negative for chills and fever.  Musculoskeletal: Positive for arthralgias.  All other systems reviewed and are negative.    Objective: Vital Signs: Ht 5\' 7"  (1.702 m)   Wt 199 lb (90.3 kg)   LMP 08/28/2012   BMI 31.17 kg/m   Physical Exam Physical Exam  Constitutional: He is oriented to person, place, and time. He appears well-developed and well-nourished.  Pulmonary/Chest: Effort normal.  Neurological: He is alert and oriented to person, place, and time.  Psychiatric: He has a normal mood and affect.  Nursing note reviewed.   Left Shoulder Exam   Tenderness  The patient is experiencing tenderness in the biceps tendon.  Range of Motion  The patient has normal left shoulder ROM.  Tests  Cross Arm: negative Impingement: positive  Other  Sensation: normal       Specialty Comments:  No specialty comments available.  Imaging: No results found.   PMFS History: Patient Active Problem List   Diagnosis Date Noted  . Hyperkalemia 12/28/2015  . COPD exacerbation ([place]) 12/25/2015  . Anemia 12/25/2015  . Shortness of breath 12/25/2015  . Cardiomegaly 12/25/2015  .  Bipolar 1 disorder, mixed, moderate ([place]) 06/03/2015  . CAP (community acquired pneumonia) 04/30/2015  . Pneumonitis 04/30/2015  . Status asthmaticus 04/30/2015  . Acute respiratory failure with hypoxia ([place]) 04/30/2015  . Dyspnea 04/30/2015  . Hyponatremia, severe 04/30/2015  . Lumbar spondylosis 04/27/2011  . Screening for colon cancer 01/17/2011  . Abnormal EKG 01/17/2011  . Tobacco abuse 01/17/2011  . Tobacco abuse counseling 01/17/2011  . Hypertension   . Chronic low back pain    Past Medical History:  Diagnosis Date  . Anxiety    hx  . Arthritis    "lower back, hands" (05/01/2015)  . Chronic low back  pain    disk s/p 4 diskectomies, 5th fursion, then spinal cord stimulator ([place])  . Depression   . Fibromyalgia   . GERD (gastroesophageal reflux disease)   . Hypertension   . Pneumonia 05/01/2015  . Rotator cuff disorder   . Seasonal asthma   . Shortness of breath dyspnea   . Walking pneumonia     Family History  Problem Relation Age of Onset  . Stroke Father   . Hypertension Father   . Lung cancer Father     Past Surgical History:  Procedure Laterality Date  . BACK SURGERY    . CESAREAN SECTION  1986  . LUMBAR MICRODISCECTOMY  01/1990; 2003  . LUMBAR SPINE SURGERY  03/1991   "cleaned up scar tissue"  . POSTERIOR LUMBAR FUSION  2011; 04/27/2011  . REPAIR DURAL / CSF LEAK  02/1990  . SPINAL CORD STIMULATOR IMPLANT  2012  . SPINAL CORD STIMULATOR REMOVAL  08/2010  . TUBAL LIGATION  1986   Social History   Occupational History  . Not on file.   Social History Main Topics  . Smoking status: Current Every Day Smoker    Packs/day: 1.00    Years: 42.00    Types: Cigarettes  . Smokeless tobacco: Never Used     Comment: prior trial of zyban made her feel weird   . Alcohol use No  . Drug use:      Comment: 05/01/2015 "I've used lots of multiple street drugs; nothing in the past couple years"  . Sexual activity: Yes

## 2016-01-04 ENCOUNTER — Inpatient Hospital Stay: Payer: Medicare Other | Admitting: Acute Care

## 2016-01-05 DIAGNOSIS — Z79891 Long term (current) use of opiate analgesic: Secondary | ICD-10-CM | POA: Diagnosis not present

## 2016-01-19 DIAGNOSIS — Z79891 Long term (current) use of opiate analgesic: Secondary | ICD-10-CM | POA: Diagnosis not present

## 2016-01-27 ENCOUNTER — Ambulatory Visit (INDEPENDENT_AMBULATORY_CARE_PROVIDER_SITE_OTHER): Payer: Medicare Other | Admitting: Orthopedic Surgery

## 2016-02-02 DIAGNOSIS — Z79891 Long term (current) use of opiate analgesic: Secondary | ICD-10-CM | POA: Diagnosis not present

## 2016-02-19 DIAGNOSIS — Z79891 Long term (current) use of opiate analgesic: Secondary | ICD-10-CM | POA: Diagnosis not present

## 2016-03-04 ENCOUNTER — Encounter (HOSPITAL_COMMUNITY): Payer: Self-pay | Admitting: Emergency Medicine

## 2016-03-04 ENCOUNTER — Emergency Department (HOSPITAL_COMMUNITY): Payer: Medicare Other

## 2016-03-04 ENCOUNTER — Emergency Department (HOSPITAL_COMMUNITY)
Admission: EM | Admit: 2016-03-04 | Discharge: 2016-03-04 | Disposition: A | Discharge status: Home / Self Care | Payer: Medicare Other | Attending: Emergency Medicine | Admitting: Emergency Medicine

## 2016-03-04 DIAGNOSIS — M545 Low back pain: Secondary | ICD-10-CM | POA: Insufficient documentation

## 2016-03-04 DIAGNOSIS — F17210 Nicotine dependence, cigarettes, uncomplicated: Secondary | ICD-10-CM | POA: Diagnosis not present

## 2016-03-04 DIAGNOSIS — R109 Unspecified abdominal pain: Secondary | ICD-10-CM | POA: Insufficient documentation

## 2016-03-04 DIAGNOSIS — M5442 Lumbago with sciatica, left side: Secondary | ICD-10-CM | POA: Diagnosis not present

## 2016-03-04 DIAGNOSIS — Z79899 Other long term (current) drug therapy: Secondary | ICD-10-CM | POA: Diagnosis not present

## 2016-03-04 DIAGNOSIS — I10 Essential (primary) hypertension: Secondary | ICD-10-CM | POA: Insufficient documentation

## 2016-03-04 DIAGNOSIS — J449 Chronic obstructive pulmonary disease, unspecified: Secondary | ICD-10-CM | POA: Insufficient documentation

## 2016-03-04 DIAGNOSIS — G8929 Other chronic pain: Secondary | ICD-10-CM | POA: Insufficient documentation

## 2016-03-04 DIAGNOSIS — R030 Elevated blood-pressure reading, without diagnosis of hypertension: Secondary | ICD-10-CM | POA: Diagnosis not present

## 2016-03-04 DIAGNOSIS — M546 Pain in thoracic spine: Secondary | ICD-10-CM | POA: Diagnosis not present

## 2016-03-04 LAB — URINALYSIS, ROUTINE W REFLEX MICROSCOPIC
Bilirubin Urine: NEGATIVE
Glucose, UA: NEGATIVE mg/dL
Hgb urine dipstick: NEGATIVE
Ketones, ur: NEGATIVE mg/dL
Leukocytes, UA: NEGATIVE
Nitrite: NEGATIVE
Protein, ur: NEGATIVE mg/dL
Specific Gravity, Urine: 1.011 (ref 1.005–1.030)
pH: 8 (ref 5.0–8.0)

## 2016-03-04 MED ORDER — DIAZEPAM 5 MG PO TABS
10.0000 mg | ORAL_TABLET | Freq: Once | ORAL | Status: AC
  Administered 2016-03-04: 10 mg via ORAL
  Filled 2016-03-04: qty 2

## 2016-03-04 MED ORDER — DIAZEPAM 10 MG PO TABS: 10.0000 mg | ORAL_TABLET | Freq: Two times a day (BID) | ORAL | 0 refills | Status: DC | PRN

## 2016-03-04 MED ORDER — KETOROLAC TROMETHAMINE 60 MG/2ML IM SOLN
60.0000 mg | Freq: Once | INTRAMUSCULAR | Status: AC
  Administered 2016-03-04: 60 mg via INTRAMUSCULAR
  Filled 2016-03-04: qty 2

## 2016-03-04 MED ORDER — DICLOFENAC EPOLAMINE 1.3 % TD PTCH
1.0000 | MEDICATED_PATCH | Freq: Two times a day (BID) | TRANSDERMAL | Status: DC
  Administered 2016-03-04: 1 via TRANSDERMAL
  Filled 2016-03-04: qty 1

## 2016-03-04 MED ORDER — HYDROMORPHONE HCL 1 MG/ML IJ SOLN
1.0000 mg | Freq: Once | INTRAMUSCULAR | Status: AC
  Administered 2016-03-04: 1 mg via INTRAMUSCULAR
  Filled 2016-03-04: qty 1

## 2016-03-04 MED ORDER — DICLOFENAC EPOLAMINE 1.3 % TD PTCH: 1.0000 | MEDICATED_PATCH | Freq: Two times a day (BID) | TRANSDERMAL | 0 refills | Status: DC

## 2016-03-04 NOTE — ED Notes (Signed)
Social work at bedside.  

## 2016-03-04 NOTE — ED Triage Notes (Signed)
Pt brought in by EMS for c/o lower back pain that radiates down into her left knee  Pt has hx of back surgery

## 2016-03-04 NOTE — ED Notes (Signed)
ED Provider at bedside. 

## 2016-03-04 NOTE — ED Notes (Signed)
Patient transported to CT 

## 2016-03-04 NOTE — ED Provider Notes (Signed)
[facility] DEPT Provider Note   CSN: [id number] Arrival date & time: 03/04/16  [id number]     History   Chief Complaint Chief Complaint  Patient presents with  . Back Pain    HPI Madeline Dickerson is a 56 y.o. female with history of chronic low back pain who presents with a one-week history of left-sided low back pain that is radiating to her left knee. Patient reports falling face forward a week ago and having symptoms beginning around 2-3 days after fall. Patient states that she has been having severe pain at night and waking up diaphoretic. Patient has been taking ibuprofen without relief. Patient reports not having to take extra pain medication for over a year her back pain. The patient does take Suboxone almost daily. Patient reports difficulty urinating over the past couple weeks. Patient states she feels like she has to push very hard to urinate. Patient denies any numbness, tingling, saddle anesthesia, bowel/bladder incontinence, recent procedures to back, chest pain, shortness of breath, abdominal pain.  HPI  Past Medical History:  Diagnosis Date  . Anxiety    hx  . Arthritis    "lower back, hands" (05/01/2015)  . Chronic low back pain    disk s/p 4 diskectomies, 5th fursion, then spinal cord stimulator ([place])  . Depression   . Fibromyalgia   . GERD (gastroesophageal reflux disease)   . Hypertension   . Pneumonia 05/01/2015  . Rotator cuff disorder   . Seasonal asthma   . Shortness of breath dyspnea   . Walking pneumonia     Patient Active Problem List   Diagnosis Date Noted  . Hyperkalemia 12/28/2015  . COPD exacerbation ([place]) 12/25/2015  . Anemia 12/25/2015  . Shortness of breath 12/25/2015  . Cardiomegaly 12/25/2015  . Bipolar 1 disorder, mixed, moderate ([place]) 06/03/2015  . CAP (community acquired pneumonia) 04/30/2015  . Pneumonitis 04/30/2015  . Status asthmaticus 04/30/2015  . Acute respiratory failure with hypoxia ([place]) 04/30/2015  . Dyspnea 04/30/2015    . Hyponatremia, severe 04/30/2015  . Lumbar spondylosis 04/27/2011  . Screening for colon cancer 01/17/2011  . Abnormal EKG 01/17/2011  . Tobacco abuse 01/17/2011  . Tobacco abuse counseling 01/17/2011  . Hypertension   . Chronic low back pain     Past Surgical History:  Procedure Laterality Date  . BACK SURGERY    . CESAREAN SECTION  1986  . LUMBAR MICRODISCECTOMY  01/1990; 2003  . LUMBAR SPINE SURGERY  03/1991   "cleaned up scar tissue"  . POSTERIOR LUMBAR FUSION  2011; 04/27/2011  . REPAIR DURAL / CSF LEAK  02/1990  . SPINAL CORD STIMULATOR IMPLANT  2012  . SPINAL CORD STIMULATOR REMOVAL  08/2010  . TUBAL LIGATION  1986    OB History    No data available       Home Medications    Prior to Admission medications   Medication Sig Start Date End Date Taking? Authorizing Provider  albuterol (PROVENTIL HFA;VENTOLIN HFA) 108 (90 Base) MCG/ACT inhaler Inhale 2 puffs into the lungs every 6 (six) hours as needed for wheezing or shortness of breath. 12/28/15  Yes Nishant Dhungel, MD  budesonide-formoterol (SYMBICORT) 80-4.5 MCG/ACT inhaler Inhale 2 puffs into the lungs 2 (two) times daily. Patient taking differently: Inhale 2 puffs into the lungs 2 (two) times daily as needed (shortness of breath/wheezing).  12/28/15  Yes Nishant Dhungel, MD  FLUoxetine (PROZAC) 40 MG capsule Take 1 capsule (40 mg total) by mouth daily. 11/05/14  Yes Alvina Chou,  PA-C  LYRICA 300 MG capsule Take 300 mg by mouth 2 (two) times daily as needed (pain).  12/21/15  Yes Historical Provider, MD  omeprazole (PRILOSEC) 40 MG capsule Take 40 mg by mouth daily.   Yes Historical Provider, MD  promethazine (PHENERGAN) 25 MG tablet Take 25 mg by mouth every 6 (six) hours as needed for nausea/vomiting. 12/16/15  Yes Historical Provider, MD  SUBOXONE 8-2 MG FILM Place 1 Film under the tongue 2 (two) times daily. 04/30/15  Yes Historical Provider, MD  tiotropium (SPIRIVA HANDIHALER) 18 MCG inhalation capsule Place 1  capsule (18 mcg total) into inhaler and inhale every morning. Patient taking differently: Place 18 mcg into inhaler and inhale every evening.  12/28/15  Yes Nishant Dhungel, MD  valsartan-hydrochlorothiazide (DIOVAN-HCT) 160-12.5 MG tablet Take 1 tablet by mouth daily. 01/21/16  Yes Historical Provider, MD  diazepam (VALIUM) 10 MG tablet Take 1 tablet (10 mg total) by mouth 2 (two) times daily as needed for anxiety. 03/04/16   Frederica Kuster, PA-C  diclofenac (FLECTOR) 1.3 % [facility] Place 1 patch onto the skin 2 (two) times daily. 03/04/16   Frederica Kuster, PA-C  nicotine (NICODERM CQ - DOSED IN MG/24 HOURS) 14 mg/24hr patch Place 1 patch (14 mg total) onto the skin daily. Patient not taking: Reported on 03/04/2016 12/29/15   Louellen Molder, MD    Family History Family History  Problem Relation Age of Onset  . Stroke Father   . Hypertension Father   . Lung cancer Father     Social History Social History  Substance Use Topics  . Smoking status: Current Every Day Smoker    Packs/day: 1.00    Years: 42.00    Types: Cigarettes  . Smokeless tobacco: Never Used     Comment: prior trial of zyban made her feel weird   . Alcohol use No     Allergies   Haldol [haloperidol lactate] and Naproxen   Review of Systems Review of Systems  Unable to perform ROS: Patient nonverbal  Constitutional: Negative for chills and fever.  HENT: Negative for facial swelling and sore throat.   Respiratory: Negative for shortness of breath.   Cardiovascular: Negative for chest pain.  Gastrointestinal: Positive for nausea (baseline intermittent, takes phenergan PRN) and vomiting (baseline intermittent, takes phenergan PRN). Negative for abdominal pain.  Genitourinary: Positive for difficulty urinating. Negative for dysuria.  Musculoskeletal: Positive for back pain. Negative for neck pain.  Skin: Negative for rash and wound.  Neurological: Negative for headaches.  Psychiatric/Behavioral: The patient is not  nervous/anxious.      Physical Exam Updated Vital Signs BP 158/95   Pulse 75   Temp 98.4 F (36.9 C) (Oral)   Resp 16   Ht 5\' 7"  (1.702 m)   Wt 90.7 kg   LMP 08/28/2012   SpO2 100%   BMI 31.32 kg/m   Physical Exam  Constitutional: She appears well-developed and well-nourished. No distress.  HENT:  Head: Normocephalic and atraumatic.  Mouth/Throat: Oropharynx is clear and moist. No oropharyngeal exudate.  Eyes: Conjunctivae are normal. Pupils are equal, round, and reactive to light. Right eye exhibits no discharge. Left eye exhibits no discharge. No scleral icterus.  Neck: Normal range of motion. Neck supple. No thyromegaly present.  Cardiovascular: Normal rate, regular rhythm, normal heart sounds and intact distal pulses.  Exam reveals no gallop and no friction rub.   No murmur heard. Pulmonary/Chest: Effort normal and breath sounds normal. No stridor. No respiratory distress. She has  no wheezes. She has no rales.  Abdominal: Soft. Bowel sounds are normal. She exhibits no distension. There is no tenderness. There is no rebound and no guarding.  Patient cannot confirm or deny CVA tenderness to left  Musculoskeletal: She exhibits no edema.  No significant tenderness to palpation  Lymphadenopathy:    She has no cervical adenopathy.  Neurological: She is alert. Coordination normal.  5/5 strength to lower extremities, normal sensation  Skin: Skin is warm and dry. No rash noted. She is not diaphoretic. No pallor.  Psychiatric: She has a normal mood and affect.  Nursing note and vitals reviewed.    ED Treatments / Results  Labs (all labs ordered are listed, but only abnormal results are displayed) Labs Reviewed  URINALYSIS, ROUTINE W REFLEX MICROSCOPIC    EKG  EKG Interpretation None       Radiology Dg Lumbar Spine Complete  Result Date: 03/04/2016 CLINICAL DATA:  Back pain for 1 week, left leg pain EXAM: LUMBAR SPINE - COMPLETE 4+ VIEW COMPARISON:  None. FINDINGS:  Six views of the lumbar spine submitted. Again noted posterior fusion with transpedicular screws and metallic rods at L3, L4, L5 and S1 level. There is no change in alignment from prior exam. There is significant disc space flattening with endplate sclerotic changes mild anterior spurring endplate sclerotic changes and vacuum disc phenomenon at L2-L3 level with progression from prior exam. IMPRESSION: No acute fracture. Again noted posterior fusion L3, L4-L5 S1 level without change in alignment. The fixation material appears intact. Progression of degenerative changes at L2-L3 level. Electronically Signed   By: Lahoma Crocker M.D.   On: 03/04/2016 08:10   Ct Renal Stone Study  Result Date: 03/04/2016 CLINICAL DATA:  Left-sided flank pain EXAM: CT ABDOMEN AND PELVIS WITHOUT CONTRAST TECHNIQUE: Multidetector CT imaging of the abdomen and pelvis was performed following the standard protocol without oral or intravenous contrast material administration. COMPARISON:  April 28, 2014 FINDINGS: Lower chest: Lung bases are clear.  There is a small hiatal hernia. Hepatobiliary: Liver measures 19.6 cm in length. No focal liver lesions are evident on this noncontrast enhanced study. Gallbladder wall is not appreciably thickened. There is no biliary duct dilatation. Pancreas: No pancreatic mass or inflammatory focus. Spleen: No splenic lesions are evident. Adrenals/Urinary Tract: Adrenals appear normal bilaterally. Kidneys bilaterally show no evident mass or hydronephrosis on either side. There is no evident renal or ureteral calculus on either side. Urinary bladder is midline with wall thickness within normal limits. Stomach/Bowel: There is no appreciable bowel wall or mesenteric thickening. There is no evident bowel obstruction. No free air or portal venous air. On the sagittal view, there appears to be a degree of rectal prolapse. Vascular/Lymphatic: There is atherosclerotic calcification in the distal aorta and right common  iliac artery regions. There is no demonstrable abdominal aortic aneurysm. The major mesenteric vessels appear grossly patent on this noncontrast enhanced study. There is no adenopathy appreciable in the abdomen or pelvis. Reproductive: Uterus is anteverted. There is no pelvic mass or pelvic fluid collection. There appears to be a degree of vaginal prolapse. Other: Appendix appears normal. There is thinning of the rectus muscle in the midline with a small ventral hernia containing only fat. There is no abscess or ascites in the abdomen or pelvis. Musculoskeletal: There is postoperative change in the lower lumbar spine. There is multi level arthropathy in the lower thoracic and lumbar regions. There are no blastic or lytic bone lesions. There is no intramuscular or abdominal  wall lesions. There is a small calcification in the lateral right buttocks region, likely a small previous calcified hematoma. IMPRESSION: Evidence of a degree of vaginal and rectal prolapse. No renal or ureteral calculi. No hydronephrosis. Urinary bladder wall thickness normal. No bowel obstruction.  No abscess.  Appendix appears normal. Small hiatal hernia.  Ventral hernia present containing only fat. Prominent liver without focal lesion on this noncontrast enhanced study. Postoperative change in the lumbar spine. Multifocal arthropathy in the lower thoracic and lumbar regions. Electronically Signed   By: Lowella Grip III M.D.   On: 03/04/2016 09:46    Procedures Procedures (including critical care time)  Medications Ordered in ED Medications  diclofenac (FLECTOR) 1.3 % 1 patch (1 patch Transdermal Patch Applied 03/04/16 1146)  ketorolac (TORADOL) injection 60 mg (60 mg Intramuscular Given 03/04/16 0852)  diazepam (VALIUM) tablet 10 mg (10 mg Oral Given 03/04/16 0902)  HYDROmorphone (DILAUDID) injection 1 mg (1 mg Intramuscular Given 03/04/16 1025)     Initial Impression / Assessment and Plan / ED Course  I have reviewed the  triage vital signs and the nursing notes.  Pertinent labs & imaging results that were available during my care of the patient were reviewed by me and considered in my medical decision making (see chart for details).  Clinical Course     Patient with back pain.  No neurological deficits and normal neuro exam.  Patient is ambulatory.  No loss of bowel or bladder control.  No concern for cauda equina.  No fever, night sweats, weight loss, h/o cancer, IVDA, no recent procedure to back. No urinary symptoms suggestive of UTI. Lumbar x-ray shows no acute fracture; again noted posterior fusion L3, L4-L5, S1 level without change in alignment; fixation material appears intact; progression of degenerative changes at L2-L3. CT renal stone study shows no renal or ureteral calculi, hydronephrosis, bowel obstruction, abscess; normal appendix; prominent liver without focal lesion; postop changes of lumbar spine in multifocal arthropathy in the lower thoracic and lumbar regions. Patient's pain controlled in ED with Toradol, IM Dilaudid, and diclofenac patch. We'll discharge home with small amount of Valium as muscle relaxer, and diclofenac patch. Follow-up with PCP and orthopedic doctor. Supportive care and return precaution discussed. Patient understands and agrees with plan. Appears safe for discharge at this time. I discussed patient with Dr. Vanita Panda who guided the patient's management and agrees with plan.   Final Clinical Impressions(s) / ED Diagnoses   Final diagnoses:  Chronic left-sided low back pain, with sciatica presence unspecified    New Prescriptions New Prescriptions   DICLOFENAC (FLECTOR) 1.3 % PTCH    Place 1 patch onto the skin 2 (two) times daily.     Frederica Kuster, PA-C 03/04/16 [place], MD 03/04/16 [phone]

## 2016-03-04 NOTE — Progress Notes (Signed)
CSW provided patient with daughters number to pick up patient. Patient stated she received taxi vouchers the last few times coming to the hospital. Patient stated her daughter will call back in 30 minutes. CSW will follow up.  Kingsley Spittle, LCSWA Clinical Social Worker [phone]

## 2016-03-04 NOTE — ED Notes (Signed)
Pt ambulated to restroom. 

## 2016-03-04 NOTE — Discharge Instructions (Signed)
Medications: Valium, diclofenac patch  Treatment: Take Valium twice daily as needed for muscle pain and spasms. You can take ibuprofen, Aleve, or Tylenol as prescribed over-the-counter for your pain. Apply diclofenac patch twice daily as needed for your pain. Use heat alternating 20 minutes on, 20 minutes off.  Follow-up: Please follow-up with your primary care provider and orthopedic doctor for further evaluation and treatment of your symptoms. Please return to emergency department if you develop any new or worsening symptoms.

## 2016-03-08 DIAGNOSIS — Z79891 Long term (current) use of opiate analgesic: Secondary | ICD-10-CM | POA: Diagnosis not present

## 2016-03-10 ENCOUNTER — Emergency Department (HOSPITAL_COMMUNITY): Payer: Medicare Other

## 2016-03-10 ENCOUNTER — Encounter (HOSPITAL_COMMUNITY): Payer: Self-pay

## 2016-03-10 ENCOUNTER — Emergency Department (HOSPITAL_COMMUNITY)
Admission: EM | Admit: 2016-03-10 | Discharge: 2016-03-11 | Disposition: A | Discharge status: Home / Self Care | Payer: Medicare Other | Attending: Emergency Medicine | Admitting: Emergency Medicine

## 2016-03-10 DIAGNOSIS — M5126 Other intervertebral disc displacement, lumbar region: Secondary | ICD-10-CM | POA: Diagnosis not present

## 2016-03-10 DIAGNOSIS — K2970 Gastritis, unspecified, without bleeding: Secondary | ICD-10-CM | POA: Diagnosis not present

## 2016-03-10 DIAGNOSIS — M545 Low back pain, unspecified: Secondary | ICD-10-CM

## 2016-03-10 DIAGNOSIS — I10 Essential (primary) hypertension: Secondary | ICD-10-CM | POA: Insufficient documentation

## 2016-03-10 DIAGNOSIS — M48061 Spinal stenosis, lumbar region without neurogenic claudication: Secondary | ICD-10-CM | POA: Diagnosis not present

## 2016-03-10 DIAGNOSIS — M4807 Spinal stenosis, lumbosacral region: Secondary | ICD-10-CM | POA: Diagnosis not present

## 2016-03-10 DIAGNOSIS — F17210 Nicotine dependence, cigarettes, uncomplicated: Secondary | ICD-10-CM | POA: Insufficient documentation

## 2016-03-10 DIAGNOSIS — J449 Chronic obstructive pulmonary disease, unspecified: Secondary | ICD-10-CM | POA: Diagnosis not present

## 2016-03-10 DIAGNOSIS — Z79899 Other long term (current) drug therapy: Secondary | ICD-10-CM | POA: Diagnosis not present

## 2016-03-10 DIAGNOSIS — R112 Nausea with vomiting, unspecified: Secondary | ICD-10-CM | POA: Diagnosis not present

## 2016-03-10 LAB — I-STAT CHEM 8, ED
BUN: 9 mg/dL (ref 6–20)
Calcium, Ion: 1.15 mmol/L (ref 1.15–1.40)
Chloride: 100 mmol/L — ABNORMAL LOW (ref 101–111)
Creatinine, Ser: 0.7 mg/dL (ref 0.44–1.00)
Glucose, Bld: 100 mg/dL — ABNORMAL HIGH (ref 65–99)
HCT: 39 % (ref 36.0–46.0)
Hemoglobin: 13.3 g/dL (ref 12.0–15.0)
Potassium: 3.9 mmol/L (ref 3.5–5.1)
Sodium: 136 mmol/L (ref 135–145)
TCO2: 26 mmol/L (ref 0–100)

## 2016-03-10 LAB — URINALYSIS, ROUTINE W REFLEX MICROSCOPIC
Bilirubin Urine: NEGATIVE
Glucose, UA: NEGATIVE mg/dL
Ketones, ur: NEGATIVE mg/dL
Leukocytes, UA: NEGATIVE
Nitrite: NEGATIVE
Protein, ur: NEGATIVE mg/dL
Specific Gravity, Urine: 1.008 (ref 1.005–1.030)
pH: 7 (ref 5.0–8.0)

## 2016-03-10 LAB — COMPREHENSIVE METABOLIC PANEL
ALT: 13 U/L — ABNORMAL LOW (ref 14–54)
AST: 16 U/L (ref 15–41)
Albumin: 4.1 g/dL (ref 3.5–5.0)
Alkaline Phosphatase: 70 U/L (ref 38–126)
Anion gap: 9 (ref 5–15)
BUN: 9 mg/dL (ref 6–20)
CO2: 25 mmol/L (ref 22–32)
Calcium: 9.4 mg/dL (ref 8.9–10.3)
Chloride: 100 mmol/L — ABNORMAL LOW (ref 101–111)
Creatinine, Ser: 0.72 mg/dL (ref 0.44–1.00)
GFR calc Af Amer: >60 mL/min (ref >60)
GFR calc non Af Amer: >60 mL/min (ref >60)
Glucose, Bld: 102 mg/dL — ABNORMAL HIGH (ref 65–99)
Potassium: 3.9 mmol/L (ref 3.5–5.1)
Sodium: 134 mmol/L — ABNORMAL LOW (ref 135–145)
Total Bilirubin: 0.3 mg/dL (ref 0.3–1.2)
Total Protein: 7.5 g/dL (ref 6.5–8.1)

## 2016-03-10 LAB — LIPASE, BLOOD: Lipase: 16 U/L (ref 11–51)

## 2016-03-10 LAB — CBC
HCT: 36.7 % (ref 36.0–46.0)
Hemoglobin: 12.4 g/dL (ref 12.0–15.0)
MCH: 30 pg (ref 26.0–34.0)
MCHC: 33.8 g/dL (ref 30.0–36.0)
MCV: 88.6 fL (ref 78.0–100.0)
Platelets: 339 10*3/uL (ref 150–400)
RBC: 4.14 MIL/uL (ref 3.87–5.11)
RDW: 16.2 % — ABNORMAL HIGH (ref 11.5–15.5)
WBC: 8.5 10*3/uL (ref 4.0–10.5)

## 2016-03-10 MED ORDER — ONDANSETRON 4 MG PO TBDP
4.0000 mg | ORAL_TABLET | Freq: Once | ORAL | Status: AC | PRN
  Administered 2016-03-10: 4 mg via ORAL
  Filled 2016-03-10: qty 1

## 2016-03-10 MED ORDER — ONDANSETRON HCL 4 MG/2ML IJ SOLN
4.0000 mg | Freq: Once | INTRAMUSCULAR | Status: AC
  Administered 2016-03-10: 4 mg via INTRAVENOUS
  Filled 2016-03-10: qty 2

## 2016-03-10 MED ORDER — PREDNISONE 20 MG PO TABS
60.0000 mg | ORAL_TABLET | Freq: Once | ORAL | Status: AC
  Administered 2016-03-10: 60 mg via ORAL
  Filled 2016-03-10: qty 3

## 2016-03-10 MED ORDER — SODIUM CHLORIDE 0.9 % IV BOLUS (SEPSIS)
1000.0000 mL | Freq: Once | INTRAVENOUS | Status: AC
  Administered 2016-03-10: 1000 mL via INTRAVENOUS

## 2016-03-10 MED ORDER — HYDROMORPHONE HCL 1 MG/ML IJ SOLN
1.0000 mg | Freq: Once | INTRAMUSCULAR | Status: AC
  Administered 2016-03-10: 1 mg via INTRAVENOUS
  Filled 2016-03-10: qty 1

## 2016-03-10 MED ORDER — GADOBENATE DIMEGLUMINE 529 MG/ML IV SOLN
19.0000 mL | Freq: Once | INTRAVENOUS | Status: AC | PRN
  Administered 2016-03-10: 19 mL via INTRAVENOUS

## 2016-03-10 NOTE — ED Notes (Signed)
Return from MRI condition stable

## 2016-03-10 NOTE — ED Notes (Signed)
Unable to collect labs I do not see or feel any places to collect labs from

## 2016-03-10 NOTE — ED Notes (Signed)
Attempted lab draw but unsuccessful. 

## 2016-03-10 NOTE — ED Notes (Signed)
Informed patient a urine sample is needed.

## 2016-03-10 NOTE — ED Triage Notes (Signed)
Pt reports back pain x 2 weeks.  Pt also reports n/v x 1 days.  EMS reports pt feels warm to the touch but not hot. Pt seen 2 weeks ago for similar.

## 2016-03-10 NOTE — ED Notes (Signed)
Patient transported to MRI 

## 2016-03-10 NOTE — ED Provider Notes (Signed)
[facility] DEPT Provider Note   CSN: [id number] Arrival date & time: 03/10/16  1704     History   Chief Complaint Chief Complaint  Patient presents with  . Back Pain  . Emesis    HPI Madeline Dickerson is a 56 y.o. female.  HPI 56 year old female with past medical history as below who presents with severe lower back and left-sided hip pain. Patient was just seen in the ER 2 weeks ago. She had a negative stone study and plain films at that time. She was discharged with supportive care. She states that since then, she has had persistent, aching, throbbing left-sided lower back pain. The pain is worse with movement and palpation. The pain occasionally radiates down her left leg. She has also noticed mild increased difficulty in urinating and feels that she has to strain. Denies any constipation or perianal anal anesthesia. No recent falls. No recent trauma. Of note, she developed nausea and vomiting over the last 24 hours. Denies any associated abdominal pain. She felt this has flared up her back pain.  Past Medical History:  Diagnosis Date  . Anxiety    hx  . Arthritis    "lower back, hands" (05/01/2015)  . Chronic low back pain    disk s/p 4 diskectomies, 5th fursion, then spinal cord stimulator ([place])  . Depression   . Fibromyalgia   . GERD (gastroesophageal reflux disease)   . Hypertension   . Pneumonia 05/01/2015  . Rotator cuff disorder   . Seasonal asthma   . Shortness of breath dyspnea   . Walking pneumonia     Patient Active Problem List   Diagnosis Date Noted  . Hyperkalemia 12/28/2015  . COPD exacerbation ([place]) 12/25/2015  . Anemia 12/25/2015  . Shortness of breath 12/25/2015  . Cardiomegaly 12/25/2015  . Bipolar 1 disorder, mixed, moderate ([place]) 06/03/2015  . CAP (community acquired pneumonia) 04/30/2015  . Pneumonitis 04/30/2015  . Status asthmaticus 04/30/2015  . Acute respiratory failure with hypoxia ([place]) 04/30/2015  . Dyspnea 04/30/2015  .  Hyponatremia, severe 04/30/2015  . Lumbar spondylosis 04/27/2011  . Screening for colon cancer 01/17/2011  . Abnormal EKG 01/17/2011  . Tobacco abuse 01/17/2011  . Tobacco abuse counseling 01/17/2011  . Hypertension   . Chronic low back pain     Past Surgical History:  Procedure Laterality Date  . BACK SURGERY    . CESAREAN SECTION  1986  . LUMBAR MICRODISCECTOMY  01/1990; 2003  . LUMBAR SPINE SURGERY  03/1991   "cleaned up scar tissue"  . POSTERIOR LUMBAR FUSION  2011; 04/27/2011  . REPAIR DURAL / CSF LEAK  02/1990  . SPINAL CORD STIMULATOR IMPLANT  2012  . SPINAL CORD STIMULATOR REMOVAL  08/2010  . TUBAL LIGATION  1986    OB History    No data available       Home Medications    Prior to Admission medications   Medication Sig Start Date End Date Taking? Authorizing Provider  albuterol (PROVENTIL HFA;VENTOLIN HFA) 108 (90 Base) MCG/ACT inhaler Inhale 2 puffs into the lungs every 6 (six) hours as needed for wheezing or shortness of breath. 12/28/15  Yes Nishant Dhungel, MD  budesonide-formoterol (SYMBICORT) 80-4.5 MCG/ACT inhaler Inhale 2 puffs into the lungs 2 (two) times daily. Patient taking differently: Inhale 2 puffs into the lungs 2 (two) times daily as needed (shortness of breath/wheezing).  12/28/15  Yes Nishant Dhungel, MD  diazepam (VALIUM) 10 MG tablet Take 1 tablet (10 mg total) by mouth 2 (  two) times daily as needed for anxiety. 03/04/16  Yes Alexandra M Law, PA-C  FLUoxetine (PROZAC) 40 MG capsule Take 1 capsule (40 mg total) by mouth daily. 11/05/14  Yes Kaitlyn Szekalski, PA-C  omeprazole (PRILOSEC) 40 MG capsule Take 40 mg by mouth daily.   Yes Historical Provider, MD  promethazine (PHENERGAN) 25 MG tablet Take 25 mg by mouth every 6 (six) hours as needed for nausea/vomiting. 12/16/15  Yes Historical Provider, MD  SUBOXONE 8-2 MG FILM Place 1 Film under the tongue 2 (two) times daily. 04/30/15  Yes Historical Provider, MD  tiotropium (SPIRIVA HANDIHALER) 18 MCG  inhalation capsule Place 1 capsule (18 mcg total) into inhaler and inhale every morning. Patient taking differently: Place 18 mcg into inhaler and inhale every evening.  12/28/15  Yes Nishant Dhungel, MD  valsartan-hydrochlorothiazide (DIOVAN-HCT) 160-12.5 MG tablet Take 1 tablet by mouth daily. 01/21/16  Yes Historical Provider, MD  cyclobenzaprine (FLEXERIL) 10 MG tablet Take 1 tablet (10 mg total) by mouth 3 (three) times daily as needed for muscle spasms. 03/11/16   Duffy Bruce, MD  diclofenac (FLECTOR) 1.3 % PTCH Place 1 patch onto the skin 2 (two) times daily. 03/04/16   Frederica Kuster, PA-C  nicotine (NICODERM CQ - DOSED IN MG/24 HOURS) 14 mg/24hr patch Place 1 patch (14 mg total) onto the skin daily. Patient not taking: Reported on 03/04/2016 12/29/15   Nishant Dhungel, MD  oxyCODONE-acetaminophen (PERCOCET/ROXICET) 5-325 MG tablet Take 1-2 tablets by mouth every 4 (four) hours as needed for severe pain. 03/11/16   Duffy Bruce, MD  predniSONE (STERAPRED UNI-PAK 21 TAB) 10 MG (21) TBPK tablet Take 1 tablet (10 mg total) by mouth daily. Take 6 tabs by mouth daily  for 2 days, then 5 tabs for 2 days, then 4 tabs for 2 days, then 3 tabs for 2 days, 2 tabs for 2 days, then 1 tab by mouth daily for 2 days 03/11/16   Duffy Bruce, MD    Family History Family History  Problem Relation Age of Onset  . Stroke Father   . Hypertension Father   . Lung cancer Father     Social History Social History  Substance Use Topics  . Smoking status: Current Every Day Smoker    Packs/day: 1.00    Years: 42.00    Types: Cigarettes  . Smokeless tobacco: Never Used     Comment: prior trial of zyban made her feel weird   . Alcohol use No     Allergies   Haldol [haloperidol lactate]; Lyrica [pregabalin]; and Naproxen   Review of Systems Review of Systems  Constitutional: Positive for fatigue. Negative for chills and fever.  HENT: Negative for congestion, rhinorrhea and sore throat.   Eyes:  Negative for visual disturbance.  Respiratory: Negative for cough, shortness of breath and wheezing.   Cardiovascular: Negative for chest pain and leg swelling.  Gastrointestinal: Positive for nausea and vomiting. Negative for abdominal pain and diarrhea.  Genitourinary: Positive for decreased urine volume. Negative for dysuria, flank pain, vaginal bleeding and vaginal discharge.  Musculoskeletal: Positive for back pain and gait problem. Negative for neck pain.  Skin: Negative for rash.  Allergic/Immunologic: Negative for immunocompromised state.  Neurological: Negative for syncope and headaches.  Hematological: Does not bruise/bleed easily.  All other systems reviewed and are negative.    Physical Exam Updated Vital Signs BP 147/84 (BP Location: Right Arm)   Pulse 86   Temp 99.8 F (37.7 C) (Oral)   Resp 18  Ht 5\' 7"  (1.702 m)   Wt 200 lb (90.7 kg)   LMP 08/28/2012   SpO2 100%   BMI 31.32 kg/m   Physical Exam  Constitutional: She is oriented to person, place, and time. She appears well-developed and well-nourished. No distress.  HENT:  Head: Normocephalic and atraumatic.  Eyes: Conjunctivae are normal.  Neck: Neck supple.  Cardiovascular: Normal rate, regular rhythm and normal heart sounds.  Exam reveals no friction rub.   No murmur heard. Pulmonary/Chest: Effort normal and breath sounds normal. No respiratory distress. She has no wheezes. She has no rales.  Abdominal: She exhibits no distension.  Musculoskeletal: She exhibits no edema.  Neurological: She is alert and oriented to person, place, and time. She exhibits normal muscle tone.  Skin: Skin is warm. Capillary refill takes less than 2 seconds.  Psychiatric: She has a normal mood and affect.  Nursing note and vitals reviewed.   Spine Exam: Inspection/Palpation: Diffuse TTP throughout lower midline and left paraspinal lumbosacral spine. Surgical scars noted. Strength: 5/5 throughout LE bilaterally (hip  flexion/extension, adduction/abduction; knee flexion/extension; foot dorsiflexion/plantarflexion, inversion/eversion; great toe inversion) Sensation: Intact to light touch in proximal and distal LE bilaterally Reflexes: 2+ quadriceps and achilles reflexes   ED Treatments / Results  Labs (all labs ordered are listed, but only abnormal results are displayed) Labs Reviewed  COMPREHENSIVE METABOLIC PANEL - Abnormal; Notable for the following:       Result Value   Sodium 134 (*)    Chloride 100 (*)    Glucose, Bld 102 (*)    ALT 13 (*)    All other components within normal limits  CBC - Abnormal; Notable for the following:    RDW 16.2 (*)    All other components within normal limits  URINALYSIS, ROUTINE W REFLEX MICROSCOPIC - Abnormal; Notable for the following:    Hgb urine dipstick SMALL (*)    Bacteria, UA RARE (*)    Squamous Epithelial / LPF 0-5 (*)    All other components within normal limits  I-STAT CHEM 8, ED - Abnormal; Notable for the following:    Chloride 100 (*)    Glucose, Bld 100 (*)    All other components within normal limits  LIPASE, BLOOD    EKG  EKG Interpretation None       Radiology Mr Lumbar Spine W Wo Contrast  Result Date: 03/10/2016 CLINICAL DATA:  Back pain for 2 weeks. Nausea and vomiting for 1 day. EXAM: MRI LUMBAR SPINE WITHOUT AND WITH CONTRAST TECHNIQUE: Multiplanar and multiecho pulse sequences of the lumbar spine were obtained without and with intravenous contrast. CONTRAST:  56mL MULTIHANCE GADOBENATE DIMEGLUMINE 529 MG/ML IV SOLN COMPARISON:  CT abdomen and pelvis March 04, 2016 and MRI of lumbar spine June 05, 2014 FINDINGS: SEGMENTATION: For the purposes of this report, the last well-formed intervertebral disc will be described as L5-S1. ALIGNMENT: Maintenance of the lumbar lordosis. No malalignment. VERTEBRAE:Vertebral bodies are intact. Status post L3 through S1 PLIF, hardware susceptibility artifact. Bridging bone marrow signal in  arthrodesis L4-5 and L5-S1 is seen on prior CT. Suspected non incorporated fusion material L3-4 compatible with CT findings. Severe L2-3 disc height loss, severe acute discogenic endplate changes and edema, with patchy enhancement. No suspicious osseous or intradiscal enhancement. Mild subacute on chronic discogenic endplate changes [id number]. CONUS MEDULLARIS: Conus medullaris terminates at L1 and demonstrates normal morphology and signal characteristics. Cauda equina is normal. No suspicious cord, leptomeningeal or epidural enhancement. PARASPINAL AND SOFT TISSUES: Small residual  T2 bright nonenhancing fluid collection within the L4-5 surgical bed most compatible with residual seroma, unlikely to represent pseudomeningocele. Severe paraspinal muscle denervation. RIGHT psoas muscle interstitial bright STIR signal and enhancement most compatible with low-grade strain, less likely myositis. DISC LEVELS: L1-2: No disc bulge, canal stenosis nor neural foraminal narrowing. L2-3: Large 6 mm broad-based disc bulge, superimposed LEFT central disc extrusion measuring 8 x 5 mm, 12 mm of contiguous caudal migration and continuous peripheral enhancement without free fragment. Displaced traversing LEFT L3 nerve within the lateral recess. Severe canal stenosis, AP dimension of the thecal sac is 3 mm. Moderate facet arthropathy and ligamentum flavum redundancy. Moderate to severe RIGHT greater than LEFT neural foraminal narrowing. L3-4: PLIF, posterior decompression. No canal stenosis or neural foraminal narrowing. L4-5: PLIF, posterior decompression. No canal stenosis or neural foraminal narrowing. L5-S1: PLIF, posterior decompression. No canal stenosis. Endplate spurring results severe RIGHT and moderate LEFT neural foraminal narrowing which may be overestimated by hardware artifact. IMPRESSION: Status post L3 through S1 PLIF and posterior decompression. Large L2-3 disc bulge and disc extrusion which displaces the traversing LEFT  L3 nerve. Severe canal stenosis L2-3 with moderate to severe L2-3 neural foraminal narrowing. Findings consistent with adjacent segment disease. No additional levels of canal stenosis. Severe RIGHT L5-S1 neural foraminal narrowing. RIGHT psoas muscle low-grade strain, less likely myositis. Electronically Signed   By: Elon Alas M.D.   On: 03/10/2016 22:10    Procedures Procedures (including critical care time)  Medications Ordered in ED Medications  ondansetron (ZOFRAN-ODT) disintegrating tablet 4 mg (4 mg Oral Given 03/10/16 1722)  HYDROmorphone (DILAUDID) injection 1 mg (1 mg Intravenous Given 03/10/16 2040)  ondansetron (ZOFRAN) injection 4 mg (4 mg Intravenous Given 03/10/16 2039)  sodium chloride 0.9 % bolus 1,000 mL (0 mLs Intravenous Stopped 03/10/16 2301)  gadobenate dimeglumine (MULTIHANCE) injection 19 mL (19 mLs Intravenous Contrast Given 03/10/16 2129)  HYDROmorphone (DILAUDID) injection 1 mg (1 mg Intravenous Given 03/10/16 2341)  predniSONE (DELTASONE) tablet 60 mg (60 mg Oral Given 03/10/16 2341)     Initial Impression / Assessment and Plan / ED Course  I have reviewed the triage vital signs and the nursing notes.  Pertinent labs & imaging results that were available during my care of the patient were reviewed by me and considered in my medical decision making (see chart for details).     56 yo F with PMHx as above who presents with acute on chronic LBP with reported increasing numbness. No trauma. On arrival, VSS. Screening labs unremarkable. Pt does c/o worsening numbness, also ? Urinary retention so MRI obtained, which shows no evidence of central stenosis/cauda equina but does show worsening spinal stenosis, particularly L2/L3. Discussed imaging, exam with Dr. Cyndy Freeze who does not feel pt need urgent/emergent intervention or surgery. Her pain is controlled here. Of note, she does have h/o opioid use and is on Suboxone. She is requesting a small amount of analgesia until she  can notify her pain clinic. I reviewed her previous records and [place] database - she has suboxone but has not received any other controlled opioids and she is honest about her use. After risks/benefits discussion, will give her 1 day of pain control until she can see her clinic, steroids, and advise outpt f/u.  Final Clinical Impressions(s) / ED Diagnoses   Final diagnoses:  Lower back pain  Foraminal stenosis of lumbosacral region    New Prescriptions Discharge Medication List as of 03/11/2016 12:21 AM    START taking these medications  Details  cyclobenzaprine (FLEXERIL) 10 MG tablet Take 1 tablet (10 mg total) by mouth 3 (three) times daily as needed for muscle spasms., Starting Fri 03/11/2016, Print    oxyCODONE-acetaminophen (PERCOCET/ROXICET) 5-325 MG tablet Take 1-2 tablets by mouth every 4 (four) hours as needed for severe pain., Starting Fri 03/11/2016, Print    predniSONE (STERAPRED UNI-PAK 21 TAB) 10 MG (21) TBPK tablet Take 1 tablet (10 mg total) by mouth daily. Take 6 tabs by mouth daily  for 2 days, then 5 tabs for 2 days, then 4 tabs for 2 days, then 3 tabs for 2 days, 2 tabs for 2 days, then 1 tab by mouth daily for 2 days, Starting Fri 03/11/2016, Print         Duffy Bruce, MD 03/11/16 1221

## 2016-03-11 MED ORDER — PREDNISONE 10 MG (21) PO TBPK: 10.0000 mg | ORAL_TABLET | Freq: Every day | ORAL | 0 refills | Status: DC

## 2016-03-11 MED ORDER — CYCLOBENZAPRINE HCL 10 MG PO TABS: 10.0000 mg | ORAL_TABLET | Freq: Three times a day (TID) | ORAL | 0 refills | Status: DC | PRN

## 2016-03-11 MED ORDER — OXYCODONE-ACETAMINOPHEN 5-325 MG PO TABS: 1.0000 | ORAL_TABLET | ORAL | 0 refills | Status: DC | PRN

## 2016-03-11 MED ORDER — OXYCODONE-ACETAMINOPHEN 5-325 MG PO TABS: 2.0000 | ORAL_TABLET | Freq: Once | ORAL | Status: DC

## 2016-03-23 DIAGNOSIS — Z79891 Long term (current) use of opiate analgesic: Secondary | ICD-10-CM | POA: Diagnosis not present

## 2016-03-24 ENCOUNTER — Ambulatory Visit (INDEPENDENT_AMBULATORY_CARE_PROVIDER_SITE_OTHER): Payer: Medicare Other | Admitting: Family

## 2016-03-24 DIAGNOSIS — M5442 Lumbago with sciatica, left side: Secondary | ICD-10-CM | POA: Diagnosis not present

## 2016-03-24 DIAGNOSIS — G8929 Other chronic pain: Secondary | ICD-10-CM

## 2016-03-24 DIAGNOSIS — M25511 Pain in right shoulder: Secondary | ICD-10-CM | POA: Diagnosis not present

## 2016-03-24 MED ORDER — IBUPROFEN 800 MG PO TABS: 800.0000 mg | ORAL_TABLET | Freq: Three times a day (TID) | ORAL | 0 refills | Status: DC | PRN

## 2016-03-24 MED ORDER — PREDNISONE 10 MG (21) PO TBPK: 10.0000 mg | ORAL_TABLET | Freq: Every day | ORAL | 0 refills | Status: DC

## 2016-03-24 MED ORDER — DIAZEPAM 10 MG PO TABS: 10.0000 mg | ORAL_TABLET | Freq: Two times a day (BID) | ORAL | 0 refills | Status: DC | PRN

## 2016-03-24 NOTE — Progress Notes (Signed)
Office Visit Note   Patient: Madeline Dickerson           Date of Birth: 10/25/1960           MRN: [id number] Visit Date: 03/24/2016              Requested by: Lucianne Lei, MD [address] [place], [place] [address] PCP: Elyn Peers, MD  No chief complaint on file.   HPI: Pt states that she has fallen several times over the past few months. She is reporting a lot of information but it is very hard to follow. She states that she has had several back surgeries and that she has been having pain in her lower spine. She also complains of right shoulder pain. The pt is talking very quickly and about medication. She is taking  Suboxone and that she is doing her best to not take pain medications. She states that she has been on narcotic pain medication for 20 years and that she is not wanting to go back. It is very difficult to try an obtain any gainful information from the pt. Pamella Pert, RMA  The patient is a 56 year old woman who is seen today for evaluation of right shoulder pain. Is also complaining of chronic low back pain. Him is status post Depo-Medrol injection about 4 weeks ago for the right shoulder. States this has resolved her pain. No issues with range of motion no pain shoulder. Has been having ongoing issues with her low back. States she is unable to get in with her spine surgeon for several more weeks. Has a good relief from prednisone tapers for the back pain in the past. Also states that the Valium works well for her muscle spasms complains of left lower extremity spasms due to her radiculopathy.    Assessment & Plan: Visit Diagnoses:  1. Chronic left-sided low back pain with left-sided sciatica   2. Right shoulder pain, unspecified chronicity     Plan: Shoulder pain is resolved. Lumbar spine radiculopath with radicular symptoms down left leg.  Discussed that she will need to follow with her PCP in the future for prescriptions of Valium. Discussed that for the back pain  she will need to get in with her spine surgeon.   Follow-Up Instructions: No Follow-up on file.   Exam: Physical Exam  Constitutional: Appears well-developed.  Head: Normocephalic.  Eyes: EOM are normal.  Neck: Normal range of motion.  Cardiovascular: Normal rate.   Pulmonary/Chest: Effort normal.  Neurological: Is alert.  Skin: Skin is warm.  Psychiatric: Has a normal mood and affect.  Back Exam   Tenderness  The patient is experiencing tenderness in the lumbar.  Range of Motion  The patient has normal back ROM.  Other  Sensation: normal Gait: normal    Right Shoulder Exam  Right shoulder exam is normal.  Tenderness  The patient is experiencing no tenderness.    Range of Motion  The patient has normal right shoulder ROM.  Muscle Strength  The patient has normal right shoulder strength.  Tests  Impingement: negative       Imaging: No results found.  Orders:  No orders of the defined types were placed in this encounter.  Meds ordered this encounter  Medications  . diazepam (VALIUM) 10 MG tablet    Sig: Take 1 tablet (10 mg total) by mouth 2 (two) times daily as needed for anxiety.    Dispense:  6 tablet  Refill:  0  . predniSONE (STERAPRED UNI-PAK 21 TAB) 10 MG (21) TBPK tablet    Sig: Take 1 tablet (10 mg total) by mouth daily. Take 6 tabs by mouth daily  for 2 days, then 5 tabs for 2 days, then 4 tabs for 2 days, then 3 tabs for 2 days, 2 tabs for 2 days, then 1 tab by mouth daily for 2 days    Dispense:  42 tablet    Refill:  0  . ibuprofen (ADVIL,MOTRIN) 800 MG tablet    Sig: Take 1 tablet (800 mg total) by mouth every 8 (eight) hours as needed.    Dispense:  30 tablet    Refill:  0     Procedures: No procedures performed  Clinical Data: No additional findings.  Subjective: Review of Systems  Constitutional: Negative for chills and fever.  Musculoskeletal: Positive for back pain. Negative for arthralgias.    Objective: Vital  Signs: LMP 08/28/2012   Specialty Comments:  No specialty comments available.  PMFS History: Patient Active Problem List   Diagnosis Date Noted  . Hyperkalemia 12/28/2015  . COPD exacerbation ([place]) 12/25/2015  . Anemia 12/25/2015  . Shortness of breath 12/25/2015  . Cardiomegaly 12/25/2015  . Bipolar 1 disorder, mixed, moderate ([place]) 06/03/2015  . CAP (community acquired pneumonia) 04/30/2015  . Pneumonitis 04/30/2015  . Status asthmaticus 04/30/2015  . Acute respiratory failure with hypoxia ([place]) 04/30/2015  . Dyspnea 04/30/2015  . Hyponatremia, severe 04/30/2015  . Lumbar spondylosis 04/27/2011  . Screening for colon cancer 01/17/2011  . Abnormal EKG 01/17/2011  . Tobacco abuse 01/17/2011  . Tobacco abuse counseling 01/17/2011  . Hypertension   . Chronic low back pain    Past Medical History:  Diagnosis Date  . Anxiety    hx  . Arthritis    "lower back, hands" (05/01/2015)  . Chronic low back pain    disk s/p 4 diskectomies, 5th fursion, then spinal cord stimulator ([place])  . Depression   . Fibromyalgia   . GERD (gastroesophageal reflux disease)   . Hypertension   . Pneumonia 05/01/2015  . Rotator cuff disorder   . Seasonal asthma   . Shortness of breath dyspnea   . Walking pneumonia     Family History  Problem Relation Age of Onset  . Stroke Father   . Hypertension Father   . Lung cancer Father     Past Surgical History:  Procedure Laterality Date  . BACK SURGERY    . CESAREAN SECTION  1986  . LUMBAR MICRODISCECTOMY  01/1990; 2003  . LUMBAR SPINE SURGERY  03/1991   "cleaned up scar tissue"  . POSTERIOR LUMBAR FUSION  2011; 04/27/2011  . REPAIR DURAL / CSF LEAK  02/1990  . SPINAL CORD STIMULATOR IMPLANT  2012  . SPINAL CORD STIMULATOR REMOVAL  08/2010  . TUBAL LIGATION  1986   Social History   Occupational History  . Not on file.   Social History Main Topics  . Smoking status: Current Every Day Smoker    Packs/day: 1.00    Years: 42.00    Types:  Cigarettes  . Smokeless tobacco: Never Used     Comment: prior trial of zyban made her feel weird   . Alcohol use No  . Drug use: Yes     Comment: 05/01/2015 "I've used lots of multiple street drugs; nothing in the past couple years"  . Sexual activity: Yes

## 2016-03-31 ENCOUNTER — Telehealth (INDEPENDENT_AMBULATORY_CARE_PROVIDER_SITE_OTHER): Payer: Self-pay | Admitting: *Deleted

## 2016-03-31 NOTE — Telephone Encounter (Signed)
Last seen by you for LBP see message below.

## 2016-03-31 NOTE — Telephone Encounter (Signed)
Patient called in this morning in regards to wanting to know if she could please be prescribed some Flexaril? She is having terrible muscle spasms. Her pharmacy is the [facility] on [address]. Thank you Her CB # [phone]. Thank you

## 2016-03-31 NOTE — Telephone Encounter (Signed)
No, she has an appointment with her spine surgeon and needs to be managed by them

## 2016-03-31 NOTE — Telephone Encounter (Signed)
I called pt to advise.  

## 2016-04-06 DIAGNOSIS — Z79891 Long term (current) use of opiate analgesic: Secondary | ICD-10-CM | POA: Diagnosis not present

## 2016-04-12 DIAGNOSIS — M5126 Other intervertebral disc displacement, lumbar region: Secondary | ICD-10-CM | POA: Diagnosis not present

## 2016-04-12 DIAGNOSIS — Z6832 Body mass index (BMI) 32.0-32.9, adult: Secondary | ICD-10-CM | POA: Diagnosis not present

## 2016-04-12 DIAGNOSIS — M4316 Spondylolisthesis, lumbar region: Secondary | ICD-10-CM | POA: Diagnosis not present

## 2016-04-12 DIAGNOSIS — R030 Elevated blood-pressure reading, without diagnosis of hypertension: Secondary | ICD-10-CM | POA: Diagnosis not present

## 2016-04-12 DIAGNOSIS — M5441 Lumbago with sciatica, right side: Secondary | ICD-10-CM | POA: Diagnosis not present

## 2016-04-18 ENCOUNTER — Other Ambulatory Visit: Payer: Self-pay | Admitting: Neurosurgery

## 2016-04-20 DIAGNOSIS — Z79891 Long term (current) use of opiate analgesic: Secondary | ICD-10-CM | POA: Diagnosis not present

## 2016-04-29 NOTE — Pre-Procedure Instructions (Signed)
Madeline Dickerson  04/29/2016      [facility] [address], [place] [address] [place] [address] Phone: [phone] Fax: [phone]  [facility] [place], [place] [address] Phone: [phone] Fax: [phone]    Your procedure is scheduled on Thursday March 15.  Report to [facility] Admitting at 10:30 A.M.  Call this number if you have problems the morning of surgery:  [phone]   Remember:  Do not eat food or drink liquids after midnight.  Take these medicines the morning of surgery with A SIP OF WATER: fluoxetine (prozac), omeprazole (prilosec), diazepam (valium) if needed, spiriva, symbicort, albuterol if needed (please bring inhaler to hospital with you)  7 days prior to surgery STOP taking any Aspirin, diclofenac (flector) patch, Aleve, Naproxen, Ibuprofen, Motrin, Advil, Goody's, BC's, all herbal medications, fish oil, and all vitamins    Do not wear jewelry, make-up or nail polish.  Do not wear lotions, powders, or perfumes, or deoderant.  Do not shave 48 hours prior to surgery.  Men may shave face and neck.  Do not bring valuables to the hospital.  [facility] is not responsible for any belongings or valuables.  Contacts, dentures or bridgework may not be worn into surgery.  Leave your suitcase in the car.  After surgery it may be brought to your room.  For patients admitted to the hospital, discharge time will be determined by your treatment team.  Patients discharged the day of surgery will not be allowed to drive home.   Special instructions:    [place]- Preparing For Surgery  Before surgery, you can play an important role. Because skin is not sterile, your skin needs to be as free of germs as possible. You can reduce the number of germs on your skin by  washing with CHG (chlorahexidine gluconate) Soap before surgery.  CHG is an antiseptic cleaner which kills germs and bonds with the skin to continue killing germs even after washing.  Please do not use if you have an allergy to CHG or antibacterial soaps. If your skin becomes reddened/irritated stop using the CHG.  Do not shave (including legs and underarms) for at least 48 hours prior to first CHG shower. It is OK to shave your face.  Please follow these instructions carefully.   1. Shower the NIGHT BEFORE SURGERY and the MORNING OF SURGERY with CHG.   2. If you chose to wash your hair, wash your hair first as usual with your normal shampoo.  3. After you shampoo, rinse your hair and body thoroughly to remove the shampoo.  4. Use CHG as you would any other liquid soap. You can apply CHG directly to the skin and wash gently with a scrungie or a clean washcloth.   5. Apply the CHG Soap to your body ONLY FROM THE NECK DOWN.  Do not use on open wounds or open sores. Avoid contact with your eyes, ears, mouth and genitals (private parts). Wash genitals (private parts) with your normal soap.  6. Wash thoroughly, paying special attention to the area where your surgery will be performed.  7. Thoroughly rinse your body with warm water from the neck down.  8. DO NOT shower/wash with your normal soap after using and rinsing off the CHG  Soap.  9. Pat yourself dry with a CLEAN TOWEL.   10. Wear CLEAN PAJAMAS   11. Place CLEAN SHEETS on your bed the night of your first shower and DO NOT SLEEP WITH PETS.    Day of Surgery: Do not apply any deodorants/lotions. Please wear clean clothes to the hospital/surgery center.      Please read over the following fact sheets that you were given. MRSA Information

## 2016-05-02 ENCOUNTER — Encounter (HOSPITAL_COMMUNITY): Payer: Self-pay

## 2016-05-02 ENCOUNTER — Encounter (HOSPITAL_COMMUNITY)
Admission: RE | Admit: 2016-05-02 | Discharge: 2016-05-02 | Disposition: A | Discharge status: Home / Self Care | Payer: Medicare Other | Source: Ambulatory Visit | Attending: Neurosurgery | Admitting: Neurosurgery

## 2016-05-02 DIAGNOSIS — K219 Gastro-esophageal reflux disease without esophagitis: Secondary | ICD-10-CM | POA: Diagnosis not present

## 2016-05-02 DIAGNOSIS — Z79899 Other long term (current) drug therapy: Secondary | ICD-10-CM | POA: Diagnosis not present

## 2016-05-02 DIAGNOSIS — Z01812 Encounter for preprocedural laboratory examination: Secondary | ICD-10-CM | POA: Insufficient documentation

## 2016-05-02 DIAGNOSIS — D649 Anemia, unspecified: Secondary | ICD-10-CM | POA: Diagnosis not present

## 2016-05-02 DIAGNOSIS — Z888 Allergy status to other drugs, medicaments and biological substances status: Secondary | ICD-10-CM | POA: Diagnosis not present

## 2016-05-02 DIAGNOSIS — M4316 Spondylolisthesis, lumbar region: Secondary | ICD-10-CM | POA: Insufficient documentation

## 2016-05-02 DIAGNOSIS — Z981 Arthrodesis status: Secondary | ICD-10-CM | POA: Diagnosis not present

## 2016-05-02 DIAGNOSIS — I10 Essential (primary) hypertension: Secondary | ICD-10-CM | POA: Diagnosis not present

## 2016-05-02 DIAGNOSIS — M5126 Other intervertebral disc displacement, lumbar region: Secondary | ICD-10-CM | POA: Diagnosis not present

## 2016-05-02 DIAGNOSIS — Z79891 Long term (current) use of opiate analgesic: Secondary | ICD-10-CM | POA: Diagnosis not present

## 2016-05-02 DIAGNOSIS — M797 Fibromyalgia: Secondary | ICD-10-CM | POA: Diagnosis not present

## 2016-05-02 DIAGNOSIS — J449 Chronic obstructive pulmonary disease, unspecified: Secondary | ICD-10-CM | POA: Diagnosis not present

## 2016-05-02 DIAGNOSIS — M1990 Unspecified osteoarthritis, unspecified site: Secondary | ICD-10-CM | POA: Diagnosis not present

## 2016-05-02 DIAGNOSIS — Z885 Allergy status to narcotic agent status: Secondary | ICD-10-CM | POA: Diagnosis not present

## 2016-05-02 HISTORY — DX: Cardiac arrhythmia, unspecified: I49.9

## 2016-05-02 LAB — CBC
HCT: 41.7 % (ref 36.0–46.0)
Hemoglobin: 13.9 g/dL (ref 12.0–15.0)
MCH: 31.1 pg (ref 26.0–34.0)
MCHC: 33.3 g/dL (ref 30.0–36.0)
MCV: 93.3 fL (ref 78.0–100.0)
Platelets: 508 10*3/uL — ABNORMAL HIGH (ref 150–400)
RBC: 4.47 MIL/uL (ref 3.87–5.11)
RDW: 14.1 % (ref 11.5–15.5)
WBC: 9.4 10*3/uL (ref 4.0–10.5)

## 2016-05-02 LAB — BASIC METABOLIC PANEL
Anion gap: 13 (ref 5–15)
BUN: 7 mg/dL (ref 6–20)
CO2: 21 mmol/L — ABNORMAL LOW (ref 22–32)
Calcium: 9.5 mg/dL (ref 8.9–10.3)
Chloride: 101 mmol/L (ref 101–111)
Creatinine, Ser: 0.84 mg/dL (ref 0.44–1.00)
GFR calc Af Amer: >60 mL/min (ref >60)
GFR calc non Af Amer: >60 mL/min (ref >60)
Glucose, Bld: 114 mg/dL — ABNORMAL HIGH (ref 65–99)
Potassium: 3.4 mmol/L — ABNORMAL LOW (ref 3.5–5.1)
Sodium: 135 mmol/L (ref 135–145)

## 2016-05-02 LAB — TYPE AND SCREEN
ABO/RH(D): A POS
Antibody Screen: NEGATIVE

## 2016-05-02 LAB — SURGICAL PCR SCREEN
MRSA, PCR: NEGATIVE
Staphylococcus aureus: POSITIVE — AB

## 2016-05-02 MED ORDER — CHLORHEXIDINE GLUCONATE CLOTH 2 % EX PADS: 6.0000 | MEDICATED_PAD | Freq: Once | CUTANEOUS | Status: DC

## 2016-05-02 NOTE — Progress Notes (Signed)
PCP: Lucianne Lei Pt denies cardiologist ECHO: 12/26/15 EKG 12/26/15 CXR: 12/25/15  Pt states in 2009/2010 she was in the hospital with back pain, no chest pain and was asked to do a stress test, pt states she refuses to do stress test bc she is scared. Pt denies having any cardiac hx or chest pain.

## 2016-05-02 NOTE — Progress Notes (Signed)
PCR positive for MSSA, prescription called to pharmacy and patient notified.

## 2016-05-04 DIAGNOSIS — Z79891 Long term (current) use of opiate analgesic: Secondary | ICD-10-CM | POA: Diagnosis not present

## 2016-05-05 ENCOUNTER — Inpatient Hospital Stay (HOSPITAL_COMMUNITY)
Admission: RE | Admit: 2016-05-05 | Discharge: 2016-05-10 | DRG: 460 | Disposition: A | Discharge status: Home Health | Payer: Medicare Other | Source: Ambulatory Visit | Attending: Neurosurgery | Admitting: Neurosurgery

## 2016-05-05 ENCOUNTER — Encounter (HOSPITAL_COMMUNITY)
Admission: RE | Disposition: A | Discharge status: Home Health | Payer: Self-pay | Source: Ambulatory Visit | Attending: Neurosurgery

## 2016-05-05 DIAGNOSIS — Z888 Allergy status to other drugs, medicaments and biological substances status: Secondary | ICD-10-CM

## 2016-05-05 DIAGNOSIS — M797 Fibromyalgia: Secondary | ICD-10-CM | POA: Diagnosis present

## 2016-05-05 DIAGNOSIS — M4316 Spondylolisthesis, lumbar region: Secondary | ICD-10-CM | POA: Diagnosis not present

## 2016-05-05 DIAGNOSIS — D649 Anemia, unspecified: Secondary | ICD-10-CM | POA: Diagnosis present

## 2016-05-05 DIAGNOSIS — Z6833 Body mass index (BMI) 33.0-33.9, adult: Secondary | ICD-10-CM

## 2016-05-05 DIAGNOSIS — F319 Bipolar disorder, unspecified: Secondary | ICD-10-CM | POA: Diagnosis present

## 2016-05-05 DIAGNOSIS — Z981 Arthrodesis status: Secondary | ICD-10-CM | POA: Diagnosis not present

## 2016-05-05 DIAGNOSIS — F1120 Opioid dependence, uncomplicated: Secondary | ICD-10-CM | POA: Diagnosis present

## 2016-05-05 DIAGNOSIS — M1990 Unspecified osteoarthritis, unspecified site: Secondary | ICD-10-CM | POA: Diagnosis present

## 2016-05-05 DIAGNOSIS — M5126 Other intervertebral disc displacement, lumbar region: Principal | ICD-10-CM | POA: Diagnosis present

## 2016-05-05 DIAGNOSIS — F17210 Nicotine dependence, cigarettes, uncomplicated: Secondary | ICD-10-CM | POA: Diagnosis present

## 2016-05-05 DIAGNOSIS — K219 Gastro-esophageal reflux disease without esophagitis: Secondary | ICD-10-CM | POA: Diagnosis present

## 2016-05-05 DIAGNOSIS — Z885 Allergy status to narcotic agent status: Secondary | ICD-10-CM | POA: Diagnosis not present

## 2016-05-05 DIAGNOSIS — E669 Obesity, unspecified: Secondary | ICD-10-CM | POA: Diagnosis present

## 2016-05-05 DIAGNOSIS — J449 Chronic obstructive pulmonary disease, unspecified: Secondary | ICD-10-CM | POA: Diagnosis present

## 2016-05-05 DIAGNOSIS — F418 Other specified anxiety disorders: Secondary | ICD-10-CM | POA: Diagnosis present

## 2016-05-05 DIAGNOSIS — Z79899 Other long term (current) drug therapy: Secondary | ICD-10-CM | POA: Diagnosis not present

## 2016-05-05 DIAGNOSIS — Z419 Encounter for procedure for purposes other than remedying health state, unspecified: Secondary | ICD-10-CM

## 2016-05-05 DIAGNOSIS — I10 Essential (primary) hypertension: Secondary | ICD-10-CM | POA: Diagnosis present

## 2016-05-05 SURGERY — POSTERIOR LUMBAR FUSION 1 WITH HARDWARE REMOVAL
Anesthesia: General

## 2016-05-05 MED ORDER — LIDOCAINE 2% (20 MG/ML) 5 ML SYRINGE
INTRAMUSCULAR | Status: AC
  Filled 2016-05-05: qty 5

## 2016-05-05 MED ORDER — FENTANYL CITRATE (PF) 100 MCG/2ML IJ SOLN
50.0000 ug | Freq: Once | INTRAMUSCULAR | Status: AC
  Administered 2016-05-05: 50 ug via INTRAVENOUS

## 2016-05-05 MED ORDER — HYDROMORPHONE HCL 2 MG PO TABS
2.0000 mg | ORAL_TABLET | ORAL | Status: DC | PRN
  Administered 2016-05-05 – 2016-05-09 (×5): 2 mg via ORAL
  Filled 2016-05-05 (×5): qty 1

## 2016-05-05 MED ORDER — CEFAZOLIN SODIUM-DEXTROSE 2-4 GM/100ML-% IV SOLN
INTRAVENOUS | Status: AC
Start: 2016-05-05 — End: 2016-05-05
  Filled 2016-05-05: qty 100

## 2016-05-05 MED ORDER — SODIUM CHLORIDE 0.9 % IV SOLN: 250.0000 mL | INTRAVENOUS | Status: DC | PRN

## 2016-05-05 MED ORDER — FENTANYL CITRATE (PF) 100 MCG/2ML IJ SOLN
INTRAMUSCULAR | Status: AC
  Filled 2016-05-05: qty 2

## 2016-05-05 MED ORDER — PROPOFOL 10 MG/ML IV BOLUS
INTRAVENOUS | Status: AC
  Filled 2016-05-05: qty 20

## 2016-05-05 MED ORDER — LACTATED RINGERS IV SOLN
INTRAVENOUS | Status: DC
  Administered 2016-05-05: 50 mL/h via INTRAVENOUS

## 2016-05-05 MED ORDER — ROCURONIUM BROMIDE 50 MG/5ML IV SOSY
PREFILLED_SYRINGE | INTRAVENOUS | Status: AC
  Filled 2016-05-05: qty 5

## 2016-05-05 MED ORDER — DIAZEPAM 5 MG PO TABS
5.0000 mg | ORAL_TABLET | Freq: Four times a day (QID) | ORAL | Status: DC | PRN
  Administered 2016-05-05 – 2016-05-10 (×10): 5 mg via ORAL
  Filled 2016-05-05 (×10): qty 1

## 2016-05-05 MED ORDER — SUCCINYLCHOLINE CHLORIDE 200 MG/10ML IV SOSY
PREFILLED_SYRINGE | INTRAVENOUS | Status: AC
  Filled 2016-05-05: qty 10

## 2016-05-05 MED ORDER — ONDANSETRON HCL 4 MG/2ML IJ SOLN
INTRAMUSCULAR | Status: AC
  Filled 2016-05-05: qty 2

## 2016-05-05 MED ORDER — SUFENTANIL CITRATE 50 MCG/ML IV SOLN
INTRAVENOUS | Status: AC
  Filled 2016-05-05: qty 1

## 2016-05-05 MED ORDER — ONDANSETRON HCL 4 MG PO TABS
4.0000 mg | ORAL_TABLET | Freq: Four times a day (QID) | ORAL | Status: DC | PRN
  Administered 2016-05-10: 4 mg via ORAL
  Filled 2016-05-05: qty 1

## 2016-05-05 MED ORDER — PROPOFOL 10 MG/ML IV BOLUS
INTRAVENOUS | Status: AC
Start: 2016-05-05 — End: 2016-05-05
  Filled 2016-05-05: qty 40

## 2016-05-05 MED ORDER — SODIUM CHLORIDE 0.9% FLUSH
3.0000 mL | Freq: Two times a day (BID) | INTRAVENOUS | Status: DC
  Administered 2016-05-07 – 2016-05-08 (×2): 3 mL via INTRAVENOUS

## 2016-05-05 MED ORDER — SODIUM CHLORIDE 0.9 % IJ SOLN
INTRAMUSCULAR | Status: AC
  Filled 2016-05-05: qty 10

## 2016-05-05 MED ORDER — ONDANSETRON HCL 4 MG/2ML IJ SOLN
4.0000 mg | Freq: Four times a day (QID) | INTRAMUSCULAR | Status: DC | PRN
  Administered 2016-05-06 – 2016-05-09 (×7): 4 mg via INTRAVENOUS
  Filled 2016-05-05 (×6): qty 2

## 2016-05-05 MED ORDER — POTASSIUM CHLORIDE IN NACL 20-0.9 MEQ/L-% IV SOLN
INTRAVENOUS | Status: DC
  Administered 2016-05-05: 20:00:00 via INTRAVENOUS
  Filled 2016-05-05 (×2): qty 1000

## 2016-05-05 MED ORDER — FENTANYL CITRATE (PF) 100 MCG/2ML IJ SOLN
INTRAMUSCULAR | Status: AC
  Administered 2016-05-05: 50 ug via INTRAVENOUS
  Filled 2016-05-05: qty 2

## 2016-05-05 MED ORDER — SODIUM CHLORIDE 0.9% FLUSH: 3.0000 mL | INTRAVENOUS | Status: DC | PRN

## 2016-05-05 MED ORDER — ACETAMINOPHEN 325 MG PO TABS: 650.0000 mg | ORAL_TABLET | Freq: Four times a day (QID) | ORAL | Status: DC | PRN

## 2016-05-05 MED ORDER — CEFAZOLIN SODIUM-DEXTROSE 2-4 GM/100ML-% IV SOLN: 2.0000 g | INTRAVENOUS | Status: AC

## 2016-05-05 MED ORDER — MORPHINE SULFATE (PF) 2 MG/ML IV SOLN
2.0000 mg | INTRAVENOUS | Status: DC | PRN
  Administered 2016-05-05 – 2016-05-06 (×5): 4 mg via INTRAVENOUS
  Administered 2016-05-06: 6 mg via INTRAVENOUS
  Administered 2016-05-07 – 2016-05-08 (×2): 2 mg via INTRAVENOUS
  Administered 2016-05-09 (×2): 4 mg via INTRAVENOUS
  Administered 2016-05-09: 2 mg via INTRAVENOUS
  Filled 2016-05-05 (×2): qty 2
  Filled 2016-05-05: qty 1
  Filled 2016-05-05: qty 3
  Filled 2016-05-05 (×3): qty 2
  Filled 2016-05-05: qty 1
  Filled 2016-05-05: qty 2
  Filled 2016-05-05: qty 1
  Filled 2016-05-05: qty 2

## 2016-05-05 MED ORDER — ACETAMINOPHEN 650 MG RE SUPP: 650.0000 mg | Freq: Four times a day (QID) | RECTAL | Status: DC | PRN

## 2016-05-05 MED ORDER — FENTANYL CITRATE (PF) 100 MCG/2ML IJ SOLN
50.0000 ug | Freq: Once | INTRAMUSCULAR | Status: AC
  Administered 2016-05-05: 50 ug via INTRAVENOUS
  Filled 2016-05-05: qty 2

## 2016-05-05 MED ORDER — MIDAZOLAM HCL 2 MG/2ML IJ SOLN
INTRAMUSCULAR | Status: AC
  Filled 2016-05-05: qty 2

## 2016-05-05 NOTE — Progress Notes (Signed)
Patient complaining of 8/10 pain.  Dr. Conrad Webster notified and received order to give 50 mcg Fentanyl.  Will continue to monitor patient.

## 2016-05-05 NOTE — H&P (Signed)
Madeline Dickerson is a longterm patient of mine whom I had taken to the operating room most recently to fuse the L3-4 level.  She comes back today because she took a fall four to six weeks ago, went to the ED where they actually did do an MRI and CTs of her lumbar spine.  What she has is a large disk herniation at the level just above the last level fused at 2-3.  She is markedly stenotic at that level.  CT that was done that day does show bone all the way down in the posterolateral aspect.     Allergies  Allergen Reactions  . Haldol [Haloperidol Lactate] Other (See Comments)    Couldn't talk, couldn't move   . Lyrica [Pregabalin] Nausea Only and Other (See Comments)    Dizziness   Past Medical History:  Diagnosis Date  . Anxiety    hx  . Arthritis    "lower back, hands" (05/01/2015)  . Chronic low back pain    disk s/p 4 diskectomies, 5th fursion, then spinal cord stimulator (Keriann Rankin)  . Depression   . Dysrhythmia    irruglar heart beat "nothing wrong"   . Fibromyalgia   . GERD (gastroesophageal reflux disease)   . Hypertension   . Pneumonia 05/01/2015  . Rotator cuff disorder   . Seasonal asthma    "seasonal"  . Shortness of breath dyspnea   . Walking pneumonia    Past Surgical History:  Procedure Laterality Date  . BACK SURGERY    . CESAREAN SECTION  1986  . LUMBAR MICRODISCECTOMY  01/1990; 2003  . LUMBAR SPINE SURGERY  03/1991   "cleaned up scar tissue"  . POSTERIOR LUMBAR FUSION  2011; 04/27/2011  . REPAIR DURAL / CSF LEAK  02/1990  . SPINAL CORD STIMULATOR IMPLANT  2012  . SPINAL CORD STIMULATOR REMOVAL  08/2010  . TUBAL LIGATION  1986    Family History  Problem Relation Age of Onset  . Stroke Father   . Hypertension Father   . Lung cancer Father    Social History   Social History  . Marital status: Legally Separated    Spouse name: N/A  . Number of children: N/A  . Years of education: N/A   Occupational History  . Not on file.   Social History Main Topics  .  Smoking status: Current Every Day Smoker    Packs/day: 1.00    Years: 42.00    Types: Cigarettes  . Smokeless tobacco: Never Used     Comment: prior trial of zyban made her feel weird, pt doesnt want info right now  . Alcohol use No  . Drug use: Yes    Types: Marijuana     Comment: 05/01/2015 "I've used lots of multiple street drugs; nothing in the past couple years", "i use weed once a month"  . Sexual activity: Yes   Other Topics Concern  . Not on file   Social History Narrative  . No narrative on file   Review of Systems  Constitutional: Negative.   HENT: Negative.   Eyes: Negative.   Respiratory: Negative.   Cardiovascular: Negative.   Gastrointestinal: Negative.   Genitourinary: Negative.   Musculoskeletal: Positive for back pain.  Skin: Negative.   Neurological: Positive for focal weakness.  Endo/Heme/Allergies: Negative.   Psychiatric/Behavioral: Negative.              PHYSICAL EXAMINATION:  She has significant weakness in the hip flexors and quadriceps muscle on that side, 3/5.  It is normal on the right side.  Reflexes not elicited at the left knee, 2+ at the right, and 2+ at both ankles.  She walks in a stooped fashion, hopping so to speak on one leg, favoring the left lower extremity.  Vital signs today:  Height 5 feet 7 inches, weight 205 pounds, temperature is 99.3, blood pressure 141/93, pulse 108.  She says pain is 5/10.               IMPRESSION/PLAN:                             At this point I could simply remove all the hardware but leave the screws at L3 and then just tie those into the screws at L2, but the disk herniation is quite large.  It is on the left side, compromising the 2 root and the 3 root.  She is in a great deal of pain.      There is really no other way around this.  There is no conservative treatment for the stenosis and the weakness that she already exhibits.  I can remove all the screws as she does have a solid fusion  and just put new screws at 2 and tie those again into 3.  She understands the risks and benefits of fusions well since she has been fused now three separate occasions.

## 2016-05-05 NOTE — Progress Notes (Signed)
Patient escorted to restroom with 2 assist.  Patient complaining of 9/10 pain.  Dr. Conrad Panama notified and received order to give 1 mcg Fentanyl.

## 2016-05-05 NOTE — Anesthesia Preprocedure Evaluation (Addendum)
Anesthesia Evaluation    Airway Mallampati: II  TM Distance: >3 FB Neck ROM: Full    Dental  (+) Teeth Intact, Dental Advisory Given   Pulmonary shortness of breath, asthma , COPD,  COPD inhaler, Current Smoker,    Pulmonary exam normal breath sounds clear to auscultation       Cardiovascular hypertension, Normal cardiovascular exam+ dysrhythmias  Rhythm:Regular Rate:Normal     Neuro/Psych PSYCHIATRIC DISORDERS Anxiety Depression Bipolar Disorder Chronic low back pain  Left leg weakness    GI/Hepatic GERD  Medicated,  Endo/Other  Obesity   Renal/GU      Musculoskeletal  (+) Arthritis , Fibromyalgia -, narcotic dependent  Abdominal   Peds  Hematology  (+) anemia ,   Anesthesia Other Findings   Reproductive/Obstetrics                           Anesthesia Physical Anesthesia Plan  ASA: III  Anesthesia Plan: General   Post-op Pain Management:    Induction: Intravenous  Airway Management Planned: Oral ETT  Additional Equipment:   Intra-op Plan:   Post-operative Plan: Extubation in OR  Informed Consent: I have reviewed the patients History and Physical, chart, labs and discussed the procedure including the risks, benefits and alternatives for the proposed anesthesia with the patient or authorized representative who has indicated his/her understanding and acceptance.   Dental advisory given  Plan Discussed with: CRNA  Anesthesia Plan Comments:        Anesthesia Quick Evaluation

## 2016-05-06 ENCOUNTER — Inpatient Hospital Stay (HOSPITAL_COMMUNITY): Payer: Medicare Other | Admitting: Certified Registered Nurse Anesthetist

## 2016-05-06 ENCOUNTER — Encounter (HOSPITAL_COMMUNITY): Payer: Self-pay | Admitting: General Practice

## 2016-05-06 ENCOUNTER — Inpatient Hospital Stay (HOSPITAL_COMMUNITY): Payer: Medicare Other | Admitting: Emergency Medicine

## 2016-05-06 ENCOUNTER — Inpatient Hospital Stay (HOSPITAL_COMMUNITY): Payer: Medicare Other

## 2016-05-06 ENCOUNTER — Encounter (HOSPITAL_COMMUNITY)
Admission: RE | Disposition: A | Discharge status: Home Health | Payer: Self-pay | Source: Ambulatory Visit | Attending: Neurosurgery

## 2016-05-06 LAB — HIV ANTIBODY (ROUTINE TESTING W REFLEX): HIV Screen 4th Generation wRfx: NONREACTIVE

## 2016-05-06 SURGERY — POSTERIOR LUMBAR FUSION 1 WITH HARDWARE REMOVAL
Anesthesia: General | Site: Back

## 2016-05-06 MED ORDER — ARTIFICIAL TEARS OP OINT
TOPICAL_OINTMENT | OPHTHALMIC | Status: AC
  Filled 2016-05-06: qty 3.5

## 2016-05-06 MED ORDER — SUGAMMADEX SODIUM 200 MG/2ML IV SOLN
INTRAVENOUS | Status: AC
  Filled 2016-05-06: qty 2

## 2016-05-06 MED ORDER — THROMBIN 20000 UNITS EX SOLR
CUTANEOUS | Status: DC | PRN
  Administered 2016-05-06: 19:00:00 via TOPICAL

## 2016-05-06 MED ORDER — PREMIER PROTEIN SHAKE
2.0000 [oz_av] | Freq: Two times a day (BID) | ORAL | Status: DC
  Administered 2016-05-07 – 2016-05-10 (×5): 2 [oz_av] via ORAL
  Filled 2016-05-06 (×12): qty 325.31

## 2016-05-06 MED ORDER — FENTANYL CITRATE (PF) 100 MCG/2ML IJ SOLN
INTRAMUSCULAR | Status: AC
  Filled 2016-05-06: qty 2

## 2016-05-06 MED ORDER — KETAMINE HCL 10 MG/ML IJ SOLN
INTRAMUSCULAR | Status: DC | PRN
  Administered 2016-05-06: 50 mg via INTRAVENOUS

## 2016-05-06 MED ORDER — PROPOFOL 10 MG/ML IV BOLUS
INTRAVENOUS | Status: DC | PRN
  Administered 2016-05-06: 150 mg via INTRAVENOUS

## 2016-05-06 MED ORDER — LABETALOL HCL 5 MG/ML IV SOLN
INTRAVENOUS | Status: DC | PRN
  Administered 2016-05-06 (×2): 5 mg via INTRAVENOUS

## 2016-05-06 MED ORDER — LABETALOL HCL 5 MG/ML IV SOLN
INTRAVENOUS | Status: AC
  Filled 2016-05-06: qty 4

## 2016-05-06 MED ORDER — LIDOCAINE-EPINEPHRINE 0.5 %-1:200000 IJ SOLN
INTRAMUSCULAR | Status: DC | PRN
  Administered 2016-05-06: 10 mL

## 2016-05-06 MED ORDER — DEXAMETHASONE SODIUM PHOSPHATE 10 MG/ML IJ SOLN
INTRAMUSCULAR | Status: AC
  Filled 2016-05-06: qty 1

## 2016-05-06 MED ORDER — CEFAZOLIN SODIUM-DEXTROSE 2-3 GM-% IV SOLR
INTRAVENOUS | Status: DC | PRN
  Administered 2016-05-06 (×2): 2 g via INTRAVENOUS

## 2016-05-06 MED ORDER — DEXAMETHASONE SODIUM PHOSPHATE 10 MG/ML IJ SOLN
INTRAMUSCULAR | Status: DC | PRN
  Administered 2016-05-06: 8 mg via INTRAVENOUS

## 2016-05-06 MED ORDER — LIDOCAINE 2% (20 MG/ML) 5 ML SYRINGE
INTRAMUSCULAR | Status: AC
  Filled 2016-05-06: qty 5

## 2016-05-06 MED ORDER — ACETAMINOPHEN 10 MG/ML IV SOLN
INTRAVENOUS | Status: AC
  Filled 2016-05-06: qty 100

## 2016-05-06 MED ORDER — PROMETHAZINE HCL 25 MG/ML IJ SOLN
INTRAMUSCULAR | Status: AC
  Filled 2016-05-06: qty 1

## 2016-05-06 MED ORDER — BUPIVACAINE HCL (PF) 0.5 % IJ SOLN
INTRAMUSCULAR | Status: DC | PRN
  Administered 2016-05-06: 30 mL

## 2016-05-06 MED ORDER — ONDANSETRON HCL 4 MG/2ML IJ SOLN
INTRAMUSCULAR | Status: AC
  Filled 2016-05-06: qty 2

## 2016-05-06 MED ORDER — ACETAMINOPHEN 10 MG/ML IV SOLN
INTRAVENOUS | Status: DC | PRN
  Administered 2016-05-06: 1000 mg via INTRAVENOUS

## 2016-05-06 MED ORDER — HYDROMORPHONE HCL 1 MG/ML IJ SOLN
INTRAMUSCULAR | Status: AC
  Filled 2016-05-06: qty 2

## 2016-05-06 MED ORDER — LIDOCAINE-EPINEPHRINE 2 %-1:100000 IJ SOLN
INTRAMUSCULAR | Status: AC
  Filled 2016-05-06: qty 1

## 2016-05-06 MED ORDER — CEFAZOLIN SODIUM-DEXTROSE 2-4 GM/100ML-% IV SOLN
INTRAVENOUS | Status: AC
  Filled 2016-05-06: qty 100

## 2016-05-06 MED ORDER — LIDOCAINE HCL (CARDIAC) 20 MG/ML IV SOLN
INTRAVENOUS | Status: DC | PRN
  Administered 2016-05-06: 60 mg via INTRAVENOUS

## 2016-05-06 MED ORDER — PROPOFOL 10 MG/ML IV BOLUS
INTRAVENOUS | Status: AC
  Filled 2016-05-06: qty 20

## 2016-05-06 MED ORDER — HYDROMORPHONE HCL 1 MG/ML IJ SOLN
INTRAMUSCULAR | Status: AC
  Filled 2016-05-06: qty 1

## 2016-05-06 MED ORDER — LACTATED RINGERS IV SOLN
INTRAVENOUS | Status: DC
  Administered 2016-05-06 (×4): via INTRAVENOUS

## 2016-05-06 MED ORDER — FENTANYL CITRATE (PF) 100 MCG/2ML IJ SOLN
100.0000 ug | Freq: Once | INTRAMUSCULAR | Status: AC
  Administered 2016-05-06: 100 ug via INTRAVENOUS

## 2016-05-06 MED ORDER — BUPIVACAINE HCL (PF) 0.5 % IJ SOLN
INTRAMUSCULAR | Status: AC
  Filled 2016-05-06: qty 30

## 2016-05-06 MED ORDER — CEFAZOLIN SODIUM 1 G IJ SOLR
INTRAMUSCULAR | Status: AC
  Filled 2016-05-06: qty 20

## 2016-05-06 MED ORDER — THROMBIN 20000 UNITS EX SOLR
CUTANEOUS | Status: AC
  Filled 2016-05-06: qty 20000

## 2016-05-06 MED ORDER — MIDAZOLAM HCL 2 MG/2ML IJ SOLN
INTRAMUSCULAR | Status: AC
  Filled 2016-05-06: qty 2

## 2016-05-06 MED ORDER — MIDAZOLAM HCL 5 MG/5ML IJ SOLN
INTRAMUSCULAR | Status: DC | PRN
  Administered 2016-05-06: 2 mg via INTRAVENOUS

## 2016-05-06 MED ORDER — FENTANYL CITRATE (PF) 100 MCG/2ML IJ SOLN
INTRAMUSCULAR | Status: DC | PRN
  Administered 2016-05-06 (×3): 100 ug via INTRAVENOUS

## 2016-05-06 MED ORDER — GLYCOPYRROLATE 0.2 MG/ML IJ SOLN
INTRAMUSCULAR | Status: DC | PRN
  Administered 2016-05-06: 0.2 mg via INTRAVENOUS

## 2016-05-06 MED ORDER — KETAMINE HCL-SODIUM CHLORIDE 100-0.9 MG/10ML-% IV SOSY
PREFILLED_SYRINGE | INTRAVENOUS | Status: AC
  Filled 2016-05-06: qty 10

## 2016-05-06 MED ORDER — 0.9 % SODIUM CHLORIDE (POUR BTL) OPTIME
TOPICAL | Status: DC | PRN
  Administered 2016-05-06: 1000 mL

## 2016-05-06 MED ORDER — HYDROMORPHONE HCL 1 MG/ML IJ SOLN
0.2500 mg | INTRAMUSCULAR | Status: DC | PRN
  Administered 2016-05-06: 0.5 mg via INTRAVENOUS
  Administered 2016-05-06 (×2): 0.25 mg via INTRAVENOUS
  Administered 2016-05-07: 0.5 mg via INTRAVENOUS

## 2016-05-06 MED ORDER — FENTANYL CITRATE (PF) 100 MCG/2ML IJ SOLN
INTRAMUSCULAR | Status: AC
  Administered 2016-05-06: 100 ug via INTRAVENOUS
  Filled 2016-05-06: qty 2

## 2016-05-06 MED ORDER — SUGAMMADEX SODIUM 200 MG/2ML IV SOLN
INTRAVENOUS | Status: DC | PRN
  Administered 2016-05-06: 200 mg via INTRAVENOUS

## 2016-05-06 MED ORDER — KETAMINE HCL 10 MG/ML IJ SOLN
0.5000 mg/kg/h | INTRAMUSCULAR | Status: DC
  Administered 2016-05-06: .5 mg/kg/h via INTRAVENOUS
  Filled 2016-05-06: qty 20
  Filled 2016-05-06: qty 2
  Filled 2016-05-06: qty 20

## 2016-05-06 MED ORDER — ROCURONIUM BROMIDE 100 MG/10ML IV SOLN
INTRAVENOUS | Status: DC | PRN
  Administered 2016-05-06 (×2): 50 mg via INTRAVENOUS

## 2016-05-06 MED ORDER — HYDROMORPHONE HCL 1 MG/ML IJ SOLN
INTRAMUSCULAR | Status: DC | PRN
  Administered 2016-05-06 (×2): 0.5 mg via INTRAVENOUS

## 2016-05-06 MED ORDER — PHENYLEPHRINE HCL 10 MG/ML IJ SOLN
INTRAVENOUS | Status: DC | PRN
  Administered 2016-05-06: 15 ug/min via INTRAVENOUS

## 2016-05-06 MED ORDER — KETAMINE HCL-SODIUM CHLORIDE 100-0.9 MG/10ML-% IV SOSY: 0.5000 mg/kg | PREFILLED_SYRINGE | Freq: Once | INTRAVENOUS | Status: DC

## 2016-05-06 MED ORDER — FENTANYL CITRATE (PF) 100 MCG/2ML IJ SOLN
INTRAMUSCULAR | Status: AC
  Filled 2016-05-06: qty 4

## 2016-05-06 MED ORDER — ROCURONIUM BROMIDE 50 MG/5ML IV SOSY
PREFILLED_SYRINGE | INTRAVENOUS | Status: AC
  Filled 2016-05-06: qty 5

## 2016-05-06 MED ORDER — PHENYLEPHRINE 40 MCG/ML (10ML) SYRINGE FOR IV PUSH (FOR BLOOD PRESSURE SUPPORT)
PREFILLED_SYRINGE | INTRAVENOUS | Status: AC
  Filled 2016-05-06: qty 10

## 2016-05-06 MED ORDER — PROMETHAZINE HCL 25 MG/ML IJ SOLN
6.2500 mg | Freq: Once | INTRAMUSCULAR | Status: AC
  Administered 2016-05-06: 6.25 mg via INTRAVENOUS

## 2016-05-06 SURGICAL SUPPLY — 72 items
BAG DECANTER FOR FLEXI CONT (MISCELLANEOUS) ×3 IMPLANT
BENZOIN TINCTURE PRP APPL 2/3 (GAUZE/BANDAGES/DRESSINGS) IMPLANT
BLADE CLIPPER SURG (BLADE) IMPLANT
BUR MATCHSTICK NEURO 3.0 LAGG (BURR) ×3 IMPLANT
CANISTER SUCT 3000ML PPV (MISCELLANEOUS) ×3 IMPLANT
CARTRIDGE OIL MAESTRO DRILL (MISCELLANEOUS) ×1 IMPLANT
CLOSURE WOUND 1/2 X4 (GAUZE/BANDAGES/DRESSINGS)
CONT SPEC 4OZ CLIKSEAL STRL BL (MISCELLANEOUS) ×3 IMPLANT
COVER BACK TABLE 24X17X13 BIG (DRAPES) IMPLANT
DECANTER SPIKE VIAL GLASS SM (MISCELLANEOUS) ×3 IMPLANT
DERMABOND ADVANCED (GAUZE/BANDAGES/DRESSINGS) ×2
DERMABOND ADVANCED .7 DNX12 (GAUZE/BANDAGES/DRESSINGS) ×1 IMPLANT
DIFFUSER DRILL AIR PNEUMATIC (MISCELLANEOUS) ×3 IMPLANT
DRAPE C-ARM 42X72 X-RAY (DRAPES) ×3 IMPLANT
DRAPE C-ARMOR (DRAPES) ×3 IMPLANT
DRAPE LAPAROTOMY 100X72X124 (DRAPES) ×3 IMPLANT
DRAPE POUCH INSTRU U-SHP 10X18 (DRAPES) ×3 IMPLANT
DRAPE SURG 17X23 STRL (DRAPES) ×3 IMPLANT
DRSG OPSITE POSTOP 4X8 (GAUZE/BANDAGES/DRESSINGS) ×3 IMPLANT
DURAPREP 26ML APPLICATOR (WOUND CARE) ×3 IMPLANT
ELECT REM PT RETURN 9FT ADLT (ELECTROSURGICAL) ×3
ELECTRODE REM PT RTRN 9FT ADLT (ELECTROSURGICAL) ×1 IMPLANT
GAUZE SPONGE 4X4 12PLY STRL (GAUZE/BANDAGES/DRESSINGS) IMPLANT
GAUZE SPONGE 4X4 16PLY XRAY LF (GAUZE/BANDAGES/DRESSINGS) IMPLANT
GLOVE BIO SURGEON STRL SZ 6.5 (GLOVE) ×6 IMPLANT
GLOVE BIO SURGEON STRL SZ7 (GLOVE) ×6 IMPLANT
GLOVE BIO SURGEON STRL SZ8 (GLOVE) ×3 IMPLANT
GLOVE BIO SURGEONS STRL SZ 6.5 (GLOVE) ×3
GLOVE BIOGEL PI IND STRL 6.5 (GLOVE) ×1 IMPLANT
GLOVE BIOGEL PI IND STRL 7.5 (GLOVE) ×1 IMPLANT
GLOVE BIOGEL PI IND STRL 8.5 (GLOVE) ×1 IMPLANT
GLOVE BIOGEL PI INDICATOR 6.5 (GLOVE) ×2
GLOVE BIOGEL PI INDICATOR 7.5 (GLOVE) ×2
GLOVE BIOGEL PI INDICATOR 8.5 (GLOVE) ×2
GLOVE ECLIPSE 6.5 STRL STRAW (GLOVE) ×6 IMPLANT
GLOVE EXAM NITRILE LRG STRL (GLOVE) IMPLANT
GLOVE EXAM NITRILE XL STR (GLOVE) IMPLANT
GLOVE EXAM NITRILE XS STR PU (GLOVE) IMPLANT
GOWN STRL REUS W/ TWL LRG LVL3 (GOWN DISPOSABLE) ×5 IMPLANT
GOWN STRL REUS W/ TWL XL LVL3 (GOWN DISPOSABLE) ×1 IMPLANT
GOWN STRL REUS W/TWL 2XL LVL3 (GOWN DISPOSABLE) IMPLANT
GOWN STRL REUS W/TWL LRG LVL3 (GOWN DISPOSABLE) ×10
GOWN STRL REUS W/TWL XL LVL3 (GOWN DISPOSABLE) ×2
KIT BASIN OR (CUSTOM PROCEDURE TRAY) ×3 IMPLANT
KIT POSITION SURG JACKSON T1 (MISCELLANEOUS) ×3 IMPLANT
KIT ROOM TURNOVER OR (KITS) ×3 IMPLANT
NEEDLE HYPO 22GX1.5 SAFETY (NEEDLE) ×3 IMPLANT
NEEDLE HYPO 25X1 1.5 SAFETY (NEEDLE) ×3 IMPLANT
NEEDLE SPNL 18GX3.5 QUINCKE PK (NEEDLE) IMPLANT
NS IRRIG 1000ML POUR BTL (IV SOLUTION) ×3 IMPLANT
OIL CARTRIDGE MAESTRO DRILL (MISCELLANEOUS) ×3
PACK LAMINECTOMY NEURO (CUSTOM PROCEDURE TRAY) ×3 IMPLANT
PAD ARMBOARD 7.5X6 YLW CONV (MISCELLANEOUS) ×9 IMPLANT
ROD 60MM LUMBAR (Rod) ×3 IMPLANT
SCREW LOCK (Screw) ×4 IMPLANT
SCREW LOCK FXNS SPNE MAS PL (Screw) ×2 IMPLANT
SCREW SHANK 5.0X40MM (Screw) ×3 IMPLANT
SCREW SHANK 6.5X65 (Screw) ×3 IMPLANT
SCREW TULIP 5.5 (Screw) ×6 IMPLANT
SPACER OPAL REVOLVE 10MM ×6 IMPLANT
SPONGE LAP 4X18 X RAY DECT (DISPOSABLE) ×3 IMPLANT
SPONGE SURGIFOAM ABS GEL 100 (HEMOSTASIS) ×3 IMPLANT
STRIP CLOSURE SKIN 1/2X4 (GAUZE/BANDAGES/DRESSINGS) IMPLANT
SUT PROLENE 6 0 BV (SUTURE) IMPLANT
SUT VIC AB 0 CT1 18XCR BRD8 (SUTURE) ×2 IMPLANT
SUT VIC AB 0 CT1 8-18 (SUTURE) ×4
SUT VIC AB 2-0 CT1 18 (SUTURE) ×6 IMPLANT
SUT VIC AB 3-0 SH 8-18 (SUTURE) ×6 IMPLANT
SYR CONTROL 10ML LL (SYRINGE) ×3 IMPLANT
TOWEL GREEN STERILE (TOWEL DISPOSABLE) ×2 IMPLANT
TOWEL GREEN STERILE FF (TOWEL DISPOSABLE) ×3 IMPLANT
WATER STERILE IRR 1000ML POUR (IV SOLUTION) ×3 IMPLANT

## 2016-05-06 NOTE — Op Note (Signed)
05/05/2016 - 05/06/2016  11:21 PM  PATIENT:  Madeline Dickerson  56 y.o. female whom I have taken to the operating room two previous occasions, now with a large herniated disc at the L2/3 level. She presents for discetomy and fusion at L2/3,   PRE-OPERATIVE DIAGNOSIS:  LUMBAR TWO-THREE HERNIATED NUCLEUS PULPOSUS, LUMBAR TWO-THREE SPONDYLOLISTHESIS  POST-OPERATIVE DIAGNOSIS:  LUMBAR TWO-THREE HERNIATED NUCLEUS PULPOSUS, LUMBAR TWO-THREE SPONDYLOLISTHESIS  PROCEDURE:  Procedure(s): POSTERIOR LUMBAR INTERBODY FUSION LUMBAR 2- LUMBAR 3, Peek interbody cages 10 x30mm with autograft morsels  L2 laminectomy beyond needed exposure for a plif Non segmental pedicle screw fixation L2-3 nuvasive mas plif screws. New screw to replace previous screw at L3 on the left, 6.53mm x62mm  LUMBAR HARDWARE REMOVAL L3,4,5, S1  SURGEON:  Surgeon(s): Ashok Pall, MD Kary Kos, MD  ASSISTANTS:Cram, Dominica Severin  ANESTHESIA:   general  EBL:  Total I/O In: 2500 [I.V.:2500] Out: 82 [Urine:375; Blood:350]  BLOOD ADMINISTERED:none  CELL SAVER GIVEN:none  COUNT:per nursing  DRAINS: none   SPECIMEN:  No Specimen  DICTATION: ROIZY HAROLD is a 56 y.o. female whom was taken to the operating room intubated, and placed under a general anesthetic without difficulty. A foley catheter was placed under sterile conditions. She was positioned prone on a Jackson stable with all pressure points properly padded.  Her lumbar region was prepped and draped in a sterile manner. I infiltrated 20cc's 1/2%lidocaine/1:2000,000 strength epinephrine into the planned incision. I opened the skin with a 10 blade and took the incision down to the thoracolumbar fascia. I exposed the lamina of L2 in a subperiosteal fashion bilaterally. I confirmed my location with an intraoperative xray.  I placed self retaining retractors and started the decompression.  I decompressed the spinal canal at L2/3 via a complete laminectomy and inferior facetectomy of L2. I  used the drill and Kerrison punches to remove the bone in the neural foramina to completely decompress the nerve roots in the lateral recess, and to complete decompress the spinal canal. PLIF's were performed at L2/3 in the same fashion. I opened the disc space with a 15 blade then used a variety of instruments to remove the disc and prepare the space for the arthrodesis. We used curettes, rongeurs, punches, shavers for the disc space, and rasps in the discetomy. I measured the disc space and placed 10x6mm  Peek cages(Nuvasive) into the disc space(s). I removed all of the pedicle screws and rods from L3-S1. I replaced the left L3 screw with a 6.24mm x40 screw.   I placed a pedicle screw at L2 on the left side only, using fluoroscopic guidance. I drilled a pilot hole, then cannulated the pedicle with a probe at the site. I then tapped the pedicle, assessing the site for pedicle violations. No cutouts were appreciated. One Screw Harlin Heys) was  then placed at the site without difficulty. I attached rods and locking caps with the appropriate tools. The locking caps were secured with torque limited screwdrivers. Final films were performed and the final construct appeared to be in good position.  I closed the wound in a layered fashion. I approximated the thoracolumbar fascia, subcutaneous, and subcuticular planes with vicryl sutures. I used dermabond and an occlusive bAndage for a sterile dressing. She was extubated after being moved to the OR stretcher and taken to recovery.    PLAN OF CARE: Admit to inpatient   PATIENT DISPOSITION:  PACU - hemodynamically stable.   Delay start of Pharmacological VTE agent (>24hrs) due to surgical blood loss  or risk of bleeding:  yes

## 2016-05-06 NOTE — Anesthesia Procedure Notes (Signed)
Procedure Name: Intubation Date/Time: 05/06/2016 6:18 PM Performed by: Latrise Bowland S Pre-anesthesia Checklist: Patient identified, Emergency Drugs available, Suction available, Patient being monitored and Timeout performed Patient Re-evaluated:Patient Re-evaluated prior to inductionOxygen Delivery Method: Circle system utilized Preoxygenation: Pre-oxygenation with 100% oxygen Intubation Type: IV induction Ventilation: Mask ventilation without difficulty Laryngoscope Size: Mac and 3 Grade View: Grade I Tube type: Oral Tube size: 7.5 mm Number of attempts: 1 Airway Equipment and Method: Stylet Placement Confirmation: ETT inserted through vocal cords under direct vision,  positive ETCO2 and breath sounds checked- equal and bilateral Secured at: 22 cm Tube secured with: Tape Dental Injury: Teeth and Oropharynx as per pre-operative assessment

## 2016-05-06 NOTE — Progress Notes (Signed)
Initial Nutrition Assessment  DOCUMENTATION CODES:   Obesity unspecified  INTERVENTION:  Premiere Protein BID between meals. Each supplement provides 30 grams of protein and 160 calories.  NUTRITION DIAGNOSIS:   Inadequate oral intake related to acute illness as evidenced by per patient/family report.  GOAL:   Patient will meet greater than or equal to 90% of their needs   MONITOR:   PO intake, Supplement acceptance, I & O's, Labs, Weight trends  REASON FOR ASSESSMENT:   Malnutrition Screening Tool    ASSESSMENT:   56 yo female presenting with excruciating back pain. Patient recently underwent surgery to fuse the L3-L4 level. Patient fell 4-6 weeks ago and CT scan shows a large herniation at the level just above the last level fused at 2-3 with bone all the way down in the posterolateral aspect. PMH significant of HTN, GERD, fibromyalgia, anxiety.   Patient was in excruciating pain at time of consult. Patient reported no weight loss and potential weight gain due to fluid overload and several surgeries. Per patient chart, weight has been stable.  Patient reported appetite has been decreased due to so much pain. Patient reported she lives alone and has lately just been snacking throughout the day and not eating full meals. She stated she consumed a sandwich around midnight last night and ate 100% of her breakfast this morning (eggs, juice, sausage, and a biscuit). Patient is currently NPO due to surgery later this afternoon/evening.  Patient reported no significant troubles chewing or swallowing but was in the process of having dental work completed when her pain began. She states she chooses softer foods or cuts her food up really good.   She agreed to [employer] Protein post surgery to provide extra protein and calories for healing.   Nutrition focused physical exam performed. Findings were no fat and no muscle depletion.   Labs Reviewed: Potassium 3.4 (L), Glucose 114  (H)  Meds Reviewed: Lactated ringers  Diet Order:  Diet NPO time specified  Skin:  Reviewed, no issues  Last BM:  3/14  Height:   Ht Readings from Last 1 Encounters:  05/02/16 5' 5.5" (1.664 m)    Weight:   Wt Readings from Last 1 Encounters:  05/05/16 197 lb 6.4 oz (89.5 kg)    Ideal Body Weight:  57 kg  BMI:  Body mass index is 32.35 kg/m.  Estimated Nutritional Needs:   Kcal:  1500-1800  Protein:  75-85 grams  Fluid:  >/=1.5 L  EDUCATION NEEDS:   No education needs identified at this time  Juliann Pulse M.S. Nutrition Dietetic Intern

## 2016-05-06 NOTE — Care Management Note (Signed)
Case Management Note  Patient Details  Name: BIBI ECONOMOS MRN: [id number] Date of Birth: 06-28-1960  Subjective/Objective:  Pt in with HNP. Plan is for OR. She is from home alone.                 Action/Plan: CM will continue following post surgery for discharge disposition and physician orders.   Expected Discharge Date:                  Expected Discharge Plan:     In-House Referral:     Discharge planning Services     Post Acute Care Choice:    Choice offered to:     DME Arranged:    DME Agency:     HH Arranged:    HH Agency:     Status of Service:  In process, will continue to follow  If discussed at Long Length of Stay Meetings, dates discussed:    Additional Comments:  Pollie Friar, RN 05/06/2016, 11:23 AM

## 2016-05-06 NOTE — Transfer of Care (Signed)
Immediate Anesthesia Transfer of Care Note  Patient: Madeline Dickerson  Procedure(s) Performed: Procedure(s) with comments: POSTERIOR LUMBAR INTERBODY FUSION LUMBAR 2- LUMBAR 3 WITH LUMBAR HARDWARE REMOVAL (N/A) - POSTERIOR LUMBAR INTERBODY FUSION LUMBAR 2- LUMBAR 3 WITH LUMBAR HARDWARE REMOVAL  Patient Location: PACU  Anesthesia Type:General  Level of Consciousness: awake  Airway & Oxygen Therapy: Patient Spontanous Breathing and Patient connected to face mask oxygen  Post-op Assessment: Report given to RN and Post -op Vital signs reviewed and stable  Post vital signs: Reviewed and stable  Last Vitals:  Vitals:   05/06/16 1028 05/06/16 1420  BP: 128/89 125/79  Pulse: 80 81  Resp: 18 18  Temp: 36.7 C 37 C    Last Pain:  Vitals:   05/06/16 1420  TempSrc: Oral  PainSc:       Patients Stated Pain Goal: 6 (09/81/19 1478)  Complications: No apparent anesthesia complications

## 2016-05-07 LAB — CBC
HCT: 35.6 % — ABNORMAL LOW (ref 36.0–46.0)
Hemoglobin: 11.6 g/dL — ABNORMAL LOW (ref 12.0–15.0)
MCH: 30.5 pg (ref 26.0–34.0)
MCHC: 32.6 g/dL (ref 30.0–36.0)
MCV: 93.7 fL (ref 78.0–100.0)
Platelets: 340 10*3/uL (ref 150–400)
RBC: 3.8 MIL/uL — ABNORMAL LOW (ref 3.87–5.11)
RDW: 14.2 % (ref 11.5–15.5)
WBC: 14.1 10*3/uL — ABNORMAL HIGH (ref 4.0–10.5)

## 2016-05-07 LAB — BASIC METABOLIC PANEL
Anion gap: 13 (ref 5–15)
BUN: 5 mg/dL — ABNORMAL LOW (ref 6–20)
CO2: 23 mmol/L (ref 22–32)
Calcium: 9 mg/dL (ref 8.9–10.3)
Chloride: 99 mmol/L — ABNORMAL LOW (ref 101–111)
Creatinine, Ser: 0.64 mg/dL (ref 0.44–1.00)
GFR calc Af Amer: >60 mL/min (ref >60)
GFR calc non Af Amer: >60 mL/min (ref >60)
Glucose, Bld: 113 mg/dL — ABNORMAL HIGH (ref 65–99)
Potassium: 4.5 mmol/L (ref 3.5–5.1)
Sodium: 135 mmol/L (ref 135–145)

## 2016-05-07 LAB — CREATININE, SERUM
Creatinine, Ser: 0.65 mg/dL (ref 0.44–1.00)
GFR calc Af Amer: >60 mL/min (ref >60)
GFR calc non Af Amer: >60 mL/min (ref >60)

## 2016-05-07 MED ORDER — ACETAMINOPHEN 650 MG RE SUPP: 650.0000 mg | RECTAL | Status: DC | PRN

## 2016-05-07 MED ORDER — PHENOL 1.4 % MT LIQD
1.0000 | OROMUCOSAL | Status: DC | PRN
Start: 2016-05-07 — End: 2016-05-10

## 2016-05-07 MED ORDER — PROMETHAZINE HCL 25 MG/ML IJ SOLN
12.5000 mg | Freq: Four times a day (QID) | INTRAMUSCULAR | Status: DC | PRN
  Administered 2016-05-07 – 2016-05-08 (×2): 12.5 mg via INTRAVENOUS
  Filled 2016-05-07 (×3): qty 1

## 2016-05-07 MED ORDER — ALUM & MAG HYDROXIDE-SIMETH 200-200-20 MG/5ML PO SUSP
30.0000 mL | Freq: Four times a day (QID) | ORAL | Status: DC | PRN
  Administered 2016-05-07 – 2016-05-09 (×3): 30 mL via ORAL
  Filled 2016-05-07 (×4): qty 30

## 2016-05-07 MED ORDER — SODIUM CHLORIDE 0.9 % IV SOLN: 250.0000 mL | INTRAVENOUS | Status: DC

## 2016-05-07 MED ORDER — ZOLPIDEM TARTRATE 5 MG PO TABS
5.0000 mg | ORAL_TABLET | Freq: Every evening | ORAL | Status: DC | PRN
  Administered 2016-05-09: 5 mg via ORAL
  Filled 2016-05-07 (×2): qty 1

## 2016-05-07 MED ORDER — KETOROLAC TROMETHAMINE 15 MG/ML IJ SOLN
7.5000 mg | Freq: Four times a day (QID) | INTRAMUSCULAR | Status: AC
  Administered 2016-05-07 (×4): 7.5 mg via INTRAVENOUS
  Filled 2016-05-07 (×4): qty 1

## 2016-05-07 MED ORDER — HYDROMORPHONE HCL 1 MG/ML IJ SOLN: 1.0000 mg | INTRAMUSCULAR | Status: DC | PRN

## 2016-05-07 MED ORDER — OXYCODONE-ACETAMINOPHEN 5-325 MG PO TABS
1.0000 | ORAL_TABLET | ORAL | Status: DC | PRN
  Administered 2016-05-07: 1 via ORAL
  Administered 2016-05-07 – 2016-05-08 (×5): 2 via ORAL
  Administered 2016-05-09: 1 via ORAL
  Administered 2016-05-09 (×3): 2 via ORAL
  Administered 2016-05-09: 1 via ORAL
  Administered 2016-05-09 – 2016-05-10 (×4): 2 via ORAL
  Filled 2016-05-07 (×2): qty 2
  Filled 2016-05-07: qty 1
  Filled 2016-05-07 (×11): qty 2
  Filled 2016-05-07 (×2): qty 1

## 2016-05-07 MED ORDER — HEPARIN SODIUM (PORCINE) 5000 UNIT/ML IJ SOLN
5000.0000 [IU] | Freq: Three times a day (TID) | INTRAMUSCULAR | Status: DC
  Administered 2016-05-08 – 2016-05-10 (×7): 5000 [IU] via SUBCUTANEOUS
  Filled 2016-05-07 (×7): qty 1

## 2016-05-07 MED ORDER — BISACODYL 5 MG PO TBEC
5.0000 mg | DELAYED_RELEASE_TABLET | Freq: Every day | ORAL | Status: DC | PRN
  Administered 2016-05-08: 5 mg via ORAL
  Filled 2016-05-07: qty 1

## 2016-05-07 MED ORDER — SODIUM CHLORIDE 0.9% FLUSH
3.0000 mL | Freq: Two times a day (BID) | INTRAVENOUS | Status: DC
  Administered 2016-05-07 – 2016-05-10 (×8): 3 mL via INTRAVENOUS

## 2016-05-07 MED ORDER — NICOTINE 14 MG/24HR TD PT24
14.0000 mg | MEDICATED_PATCH | Freq: Every day | TRANSDERMAL | Status: DC
  Administered 2016-05-07 – 2016-05-08 (×2): 14 mg via TRANSDERMAL
  Filled 2016-05-07 (×5): qty 1

## 2016-05-07 MED ORDER — CYCLOBENZAPRINE HCL 10 MG PO TABS
10.0000 mg | ORAL_TABLET | Freq: Three times a day (TID) | ORAL | Status: DC | PRN
  Administered 2016-05-08 – 2016-05-09 (×2): 10 mg via ORAL
  Filled 2016-05-07 (×2): qty 1

## 2016-05-07 MED ORDER — ACETAMINOPHEN 325 MG PO TABS: 650.0000 mg | ORAL_TABLET | ORAL | Status: DC | PRN

## 2016-05-07 MED ORDER — POTASSIUM CHLORIDE IN NACL 20-0.9 MEQ/L-% IV SOLN
INTRAVENOUS | Status: DC
  Administered 2016-05-07 – 2016-05-09 (×3): via INTRAVENOUS
  Filled 2016-05-07 (×3): qty 1000

## 2016-05-07 MED ORDER — SODIUM CHLORIDE 0.9% FLUSH: 3.0000 mL | INTRAVENOUS | Status: DC | PRN

## 2016-05-07 MED ORDER — DOCUSATE SODIUM 100 MG PO CAPS
100.0000 mg | ORAL_CAPSULE | Freq: Two times a day (BID) | ORAL | Status: DC
  Administered 2016-05-07 – 2016-05-10 (×8): 100 mg via ORAL
  Filled 2016-05-07 (×8): qty 1

## 2016-05-07 MED ORDER — MENTHOL 3 MG MT LOZG: 1.0000 | LOZENGE | OROMUCOSAL | Status: DC | PRN

## 2016-05-07 MED ORDER — SENNOSIDES-DOCUSATE SODIUM 8.6-50 MG PO TABS: 1.0000 | ORAL_TABLET | Freq: Every evening | ORAL | Status: DC | PRN

## 2016-05-07 MED ORDER — MAGNESIUM CITRATE PO SOLN
1.0000 | Freq: Once | ORAL | Status: AC | PRN
  Administered 2016-05-09: 1 via ORAL
  Filled 2016-05-07: qty 296

## 2016-05-07 NOTE — Progress Notes (Signed)
Patient dangled her feet twice at the side of the bed but refused to walk this morning saying "I don't feel good". Informed her I will pass it to the incoming staff.

## 2016-05-07 NOTE — Progress Notes (Signed)
Patient sleeping in room at intervals.  When patient is checked on, patient wakes up becomes tearful.  Complaints of nausea.  PRN medication given. Patient currently resting in bed with call light in reach.

## 2016-05-07 NOTE — Progress Notes (Signed)
PT Cancellation Note  Patient Details Name: DEDRA MATSUO MRN: [id number] DOB: 08/26/1960   Cancelled Treatment:    Reason Eval/Treat Not Completed: Fatigue/lethargy limiting ability to participate. Pt reports increased nausea and cannot mobilize. Rn notified and is going to give medication soon. Will check back later as time allows.    Scheryl Marten PT, DPT  [phone]  05/07/2016, 1:26 PM

## 2016-05-07 NOTE — Progress Notes (Signed)
Subjective: Patient reports Significant improvement leg pain back pain manageable  Objective: Vital signs in last 24 hours: Temp:  [97.7 F (36.5 C)-99.8 F (37.7 C)] 98.9 F (37.2 C) (03/17 0916) Pulse Rate:  [80-99] 96 (03/17 0916) Resp:  [12-21] 20 (03/17 0916) BP: (125-175)/(79-129) 175/89 (03/17 0916) SpO2:  [92 %-98 %] 97 % (03/17 0916) Weight:  [96.3 kg (212 lb 3.2 oz)] 96.3 kg (212 lb 3.2 oz) (03/17 0300)  Intake/Output from previous day: 03/16 0701 - 03/17 0700 In: 2500 [I.V.:2500] Out: 2325 [Urine:1875; Emesis/NG output:100; Blood:350] Intake/Output this shift: No intake/output data recorded.  Strength out of 5 incision clean dry and intact  Lab Results:  Recent Labs  05/07/16 0112  WBC 14.1*  HGB 11.6*  HCT 35.6*  PLT 340   BMET  Recent Labs  05/07/16 0112  CREATININE 0.65    Studies/Results: Dg Lumbar Spine 2-3 Views  Result Date: 05/07/2016 CLINICAL DATA:  L2-3 posterior lumbar interbody fusion. EXAM: DG C-ARM 61-120 MIN; LUMBAR SPINE - 2-3 VIEW COMPARISON:  04/12/2016 FINDINGS: Intraoperative fluoroscopy is utilized for surgical control purposes. Fluoroscopy time is recorded at 32 seconds. Three spot fluoroscopic images are obtained. Spot fluoroscopic images demonstrate placement of posterior pedicular screws with intervertebral disc prosthesis. It is difficult to determine the level of involvement due to the small field of view. Additional intervertebral disc prostheses are also visualized. IMPRESSION: Intraoperative fluoroscopy utilized for surgical control purposes demonstrating placement of posterior pedicular screws and intervertebral disc prosthesis. Level of involvement is uncertain due to the small field of view. Electronically Signed   By: Lucienne Capers M.D.   On: 05/07/2016 00:00   Dg C-arm 1-60 Min  Result Date: 05/07/2016 CLINICAL DATA:  L2-3 posterior lumbar interbody fusion. EXAM: DG C-ARM 61-120 MIN; LUMBAR SPINE - 2-3 VIEW COMPARISON:   04/12/2016 FINDINGS: Intraoperative fluoroscopy is utilized for surgical control purposes. Fluoroscopy time is recorded at 32 seconds. Three spot fluoroscopic images are obtained. Spot fluoroscopic images demonstrate placement of posterior pedicular screws with intervertebral disc prosthesis. It is difficult to determine the level of involvement due to the small field of view. Additional intervertebral disc prostheses are also visualized. IMPRESSION: Intraoperative fluoroscopy utilized for surgical control purposes demonstrating placement of posterior pedicular screws and intervertebral disc prosthesis. Level of involvement is uncertain due to the small field of view. Electronically Signed   By: Lucienne Capers M.D.   On: 05/07/2016 00:00    Assessment/Plan: 56 year old female postop day 1 posterior lumbar interbody fusion L2-3 doing very well making normal and expected recovery mobilized today with physical and occupational therapy.  LOS: 2 days     Madeline Dickerson 05/07/2016, 10:09 AM

## 2016-05-07 NOTE — Progress Notes (Signed)
Paged Dr. Christella Noa and asked about PCA for patient.  No new orders received.

## 2016-05-07 NOTE — Progress Notes (Signed)
Patient asked to clarify any allergies or reactions concerning using phenergen medication for nausea/vomiting.  Patient stated "I use it everyday at home with no problem".  Patient stated when asked about Haldol allergy that "I used my grandmas Haldol many years ago and it made me crazy, I don't use it anymore".

## 2016-05-07 NOTE — Progress Notes (Signed)
Patient requesting to have a regular diet orders.

## 2016-05-07 NOTE — Progress Notes (Signed)
Message to on call regarding March 12 labs.

## 2016-05-08 MED ORDER — HYDROCHLOROTHIAZIDE 12.5 MG PO CAPS
12.5000 mg | ORAL_CAPSULE | Freq: Every day | ORAL | Status: DC
  Administered 2016-05-08 – 2016-05-10 (×3): 12.5 mg via ORAL
  Filled 2016-05-08 (×3): qty 1

## 2016-05-08 MED ORDER — ALBUTEROL SULFATE HFA 108 (90 BASE) MCG/ACT IN AERS: 2.0000 | INHALATION_SPRAY | Freq: Four times a day (QID) | RESPIRATORY_TRACT | Status: DC | PRN

## 2016-05-08 MED ORDER — LABETALOL HCL 5 MG/ML IV SOLN
INTRAVENOUS | Status: AC
  Administered 2016-05-08: 10 mg
  Filled 2016-05-08: qty 4

## 2016-05-08 MED ORDER — MOMETASONE FURO-FORMOTEROL FUM 100-5 MCG/ACT IN AERO
2.0000 | INHALATION_SPRAY | Freq: Two times a day (BID) | RESPIRATORY_TRACT | Status: DC
  Administered 2016-05-08 – 2016-05-10 (×4): 2 via RESPIRATORY_TRACT
  Filled 2016-05-08: qty 8.8

## 2016-05-08 MED ORDER — PANTOPRAZOLE SODIUM 40 MG PO TBEC
40.0000 mg | DELAYED_RELEASE_TABLET | Freq: Every day | ORAL | Status: DC
Start: 2016-05-08 — End: 2016-05-10
  Administered 2016-05-08 – 2016-05-10 (×3): 40 mg via ORAL
  Filled 2016-05-08 (×3): qty 1

## 2016-05-08 MED ORDER — PROMETHAZINE HCL 25 MG PO TABS
50.0000 mg | ORAL_TABLET | Freq: Four times a day (QID) | ORAL | Status: DC | PRN
  Administered 2016-05-08 – 2016-05-10 (×6): 50 mg via ORAL
  Filled 2016-05-08 (×6): qty 2

## 2016-05-08 MED ORDER — IRBESARTAN 150 MG PO TABS
150.0000 mg | ORAL_TABLET | Freq: Every day | ORAL | Status: DC
  Administered 2016-05-08 – 2016-05-10 (×3): 150 mg via ORAL
  Filled 2016-05-08 (×3): qty 1

## 2016-05-08 MED ORDER — ALBUTEROL SULFATE (2.5 MG/3ML) 0.083% IN NEBU: 2.5000 mg | INHALATION_SOLUTION | Freq: Four times a day (QID) | RESPIRATORY_TRACT | Status: DC | PRN

## 2016-05-08 MED ORDER — TIOTROPIUM BROMIDE MONOHYDRATE 18 MCG IN CAPS
18.0000 ug | ORAL_CAPSULE | Freq: Every evening | RESPIRATORY_TRACT | Status: DC
  Administered 2016-05-08 – 2016-05-09 (×2): 18 ug via RESPIRATORY_TRACT
  Filled 2016-05-08: qty 5

## 2016-05-08 MED ORDER — LABETALOL HCL 5 MG/ML IV SOLN
10.0000 mg | INTRAVENOUS | Status: DC | PRN
  Administered 2016-05-08 (×4): 10 mg via INTRAVENOUS
  Filled 2016-05-08 (×4): qty 4

## 2016-05-08 MED ORDER — VALSARTAN-HYDROCHLOROTHIAZIDE 160-12.5 MG PO TABS: 1.0000 | ORAL_TABLET | Freq: Every day | ORAL | Status: DC

## 2016-05-08 MED ORDER — FLUOXETINE HCL 20 MG PO CAPS
20.0000 mg | ORAL_CAPSULE | Freq: Every day | ORAL | Status: DC
  Administered 2016-05-08 – 2016-05-10 (×3): 20 mg via ORAL
  Filled 2016-05-08 (×3): qty 1

## 2016-05-08 NOTE — Evaluation (Signed)
Physical Therapy Evaluation Patient Details Name: Madeline Dickerson MRN: [id number] DOB: September 22, 1960 Today's Date: 05/08/2016   History of Present Illness  Pt is a 56 y.o. female s/p posterior lumbar interbody fusion L2-3 with lumbar hardware removal secondary to fall. She has a PMH significant for anxiety, depression, dyrrhythmia, fibromyalgia, HTN, rotator cuff disorder, SOB, previous lumbar microdiscectomy, lumbar spine surgery, and posterior lumbar fusion, spinal cord stimulator implant and removal. Pt with post-op ileus.  Clinical Impression  Pt admitted with above diagnosis. Pt currently with functional limitations due to the deficits listed below (see PT Problem List). At the time of PT eval pt was able to perform transfers and ambulation with gross supervision to min guard assist for safety. Pt limited in what she was willing to participate in with therapy due to abdominal pain and nausea. With max encouragement, was able to ambulate ~200 feet with RW for support. Pt was educated in the benefits of OOB mobility s/p back surgery. Pt will benefit from skilled PT to increase their independence and safety with mobility to allow discharge to the venue listed below.     Follow Up Recommendations Home health PT;Supervision for mobility/OOB    Equipment Recommendations  3in1 (PT)    Recommendations for Other Services       Precautions / Restrictions Precautions Precautions: Back;Fall Precaution Booklet Issued: No Precaution Comments: Reviewed precautions during functional mobility. Pt able to recall 2/3. Required Braces or Orthoses: Spinal Brace Spinal Brace: Lumbar corset (Initially no brace; after eval, order for brace ambulating) Restrictions Weight Bearing Restrictions: No      Mobility  Bed Mobility Overal bed mobility: Needs Assistance Bed Mobility: Rolling;Sidelying to Sit Rolling: Supervision Sidelying to sit: Supervision       General bed mobility comments: Good  demonstration of log roll technique. Supervision for safety.  Transfers Overall transfer level: Needs assistance Equipment used: Rolling walker (2 wheeled) Transfers: Sit to/from Stand Sit to Stand: Supervision         General transfer comment: Supervision for safety during sit<>stand.  Ambulation/Gait Ambulation/Gait assistance: Min guard Ambulation Distance (Feet): 200 Feet Assistive device: Rolling walker (2 wheeled) Gait Pattern/deviations: Step-through pattern;Decreased stride length;Trunk flexed Gait velocity: Decreased Gait velocity interpretation: Below normal speed for age/gender General Gait Details: VC's for improved posture and walker management. Pt wanting to "get therapy over with" as she states she "just wants to lay down" due to abdominal pain.   Stairs            Wheelchair Mobility    Modified Rankin (Stroke Patients Only)       Balance Overall balance assessment: Needs assistance Sitting-balance support: No upper extremity supported;Feet supported Sitting balance-Leahy Scale: Good     Standing balance support: Bilateral upper extremity supported;No upper extremity supported;During functional activity Standing balance-Leahy Scale: Fair Standing balance comment: Able to stand at sink without UE support for grooming tasks.                             Pertinent Vitals/Pain Pain Assessment: Faces Faces Pain Scale: Hurts even more Pain Location: back at incision Pain Descriptors / Indicators: Grimacing;Operative site guarding;Sore;Aching Pain Intervention(s): Monitored during session;Repositioned    Home Living Family/patient expects to be discharged to:: Private residence Living Arrangements: Alone Available Help at Discharge: Family;Available PRN/intermittently (Available "most of the day") Type of Home: Mobile home Home Access: Stairs to enter Entrance Stairs-Rails: Right Entrance Stairs-Number of Steps: 6 Home Layout: One  level Home Equipment: Walker - 2 wheels      Prior Function Level of Independence: Independent         Comments: she does not drive      Hand Dominance   Dominant Hand: Right    Extremity/Trunk Assessment   Upper Extremity Assessment Upper Extremity Assessment: Defer to OT evaluation    Lower Extremity Assessment Lower Extremity Assessment: Generalized weakness    Cervical / Trunk Assessment Cervical / Trunk Assessment: Other exceptions Cervical / Trunk Exceptions: s/p spinal surgery  Communication   Communication: No difficulties  Cognition Arousal/Alertness: Awake/alert Behavior During Therapy: WFL for tasks assessed/performed Overall Cognitive Status: Within Functional Limits for tasks assessed                 General Comments: Easily distracted by pain/nausea this session.    General Comments      Exercises     Assessment/Plan    PT Assessment Patient needs continued PT services  PT Problem List Decreased strength;Decreased range of motion;Decreased activity tolerance;Decreased balance;Decreased mobility;Decreased safety awareness;Decreased knowledge of use of DME;Decreased knowledge of precautions;Cardiopulmonary status limiting activity;Pain       PT Treatment Interventions DME instruction;Gait training;Stair training;Functional mobility training;Therapeutic activities;Therapeutic exercise;Neuromuscular re-education;Patient/family education    PT Goals (Current goals can be found in the Care Plan section)  Acute Rehab PT Goals Patient Stated Goal: to have less nausea PT Goal Formulation: With patient Time For Goal Achievement: 05/15/16 Potential to Achieve Goals: Good    Frequency Min 5X/week   Barriers to discharge        Co-evaluation               End of Session Equipment Utilized During Treatment: Gait belt Activity Tolerance: Patient limited by pain Patient left: in bed;with call bell/phone within reach;with bed alarm  set Nurse Communication: Mobility status PT Visit Diagnosis: Unsteadiness on feet (R26.81);Pain Pain - part of body:  (back)         Time: 8563-1497 PT Time Calculation (min) (ACUTE ONLY): 9 min   Charges:   PT Evaluation $PT Eval Moderate Complexity: 1 Procedure     PT G Codes:         Thelma Comp 05/08/2016, 1:48 PM   Rolinda Roan, PT, DPT Acute Rehabilitation Services Pager: [phone]

## 2016-05-08 NOTE — Anesthesia Postprocedure Evaluation (Signed)
Anesthesia Post Note  Patient: Madeline Dickerson  Procedure(s) Performed: Procedure(s) (LRB): POSTERIOR LUMBAR INTERBODY FUSION LUMBAR 2- LUMBAR 3 WITH LUMBAR HARDWARE REMOVAL (N/A)  Patient location during evaluation: PACU Anesthesia Type: General Level of consciousness: awake and alert Pain management: pain level controlled Vital Signs Assessment: post-procedure vital signs reviewed and stable Respiratory status: spontaneous breathing, nonlabored ventilation, respiratory function stable and patient connected to nasal cannula oxygen Cardiovascular status: blood pressure returned to baseline and stable Postop Assessment: no signs of nausea or vomiting Anesthetic complications: no       Last Vitals:  Vitals:   05/08/16 0048 05/08/16 0448  BP: (!) 167/81 (!) 146/76  Pulse: 89 90  Resp: 20 20  Temp: 37.4 C 37.2 C    Last Pain:  Vitals:   05/08/16 0745  TempSrc:   PainSc: 5                  Dianna Ewald,W. EDMOND

## 2016-05-08 NOTE — Progress Notes (Signed)
Patient has bag of fast food on overhead table.  Patient educated that she had been put on clear liquid diet, as this fast food does not fit criteria of clear liquid diet.  Patient agreed, stated she was " not going to eat it anyway".

## 2016-05-08 NOTE — Progress Notes (Signed)
Orthopedic Tech Progress Note Patient Details:  Madeline Dickerson Jan 07, 1961 [id number]  Patient ID: Madeline Dickerson, female   DOB: Aug 27, 1960, 56 y.o.   MRN: [id number]   [name] 05/08/2016, 10:34 AM Called in bio-tech brace order; spoke with Marya Amsler

## 2016-05-08 NOTE — Progress Notes (Signed)
Patient request to remove nasagastric tube.  Patient states "I want this tube out, it makes me sick".  10 cc ouput noted.

## 2016-05-08 NOTE — Progress Notes (Signed)
Subjective: Patient reports Patient doing well with back pain and leg pain but a lot of abdominal discomfort patient says she is not getting her home medications which include inhalers blood pressure medicine Phenergan to which she has chronic nausea forced takes anywhere from 25-50 mg as needed for chronic nausea. In addition she also takes an antacid readily. patient denies any flatus  Objective: Vital signs in last 24 hours: Temp:  [98.5 F (36.9 C)-99.6 F (37.6 C)] 98.9 F (37.2 C) (03/18 0448) Pulse Rate:  [89-98] 90 (03/18 0448) Resp:  [20] 20 (03/18 0448) BP: (146-183)/(76-95) 146/76 (03/18 0448) SpO2:  [95 %-100 %] 95 % (03/18 0448)  Intake/Output from previous day: 03/17 0701 - 03/18 0700 In: 190 [I.V.:190] Out: -  Intake/Output this shift: No intake/output data recorded.  Awake alert neurologically nonfocal tenderness in her abdomen.  Lab Results:  Recent Labs  05/07/16 0112  WBC 14.1*  HGB 11.6*  HCT 35.6*  PLT 340   BMET  Recent Labs  05/07/16 0112 05/07/16 1455  NA  --  135  K  --  4.5  CL  --  99*  CO2  --  23  GLUCOSE  --  113*  BUN  --  5*  CREATININE 0.65 0.64  CALCIUM  --  9.0    Studies/Results: Dg Lumbar Spine 2-3 Views  Result Date: 05/07/2016 CLINICAL DATA:  L2-3 posterior lumbar interbody fusion. EXAM: DG C-ARM 61-120 MIN; LUMBAR SPINE - 2-3 VIEW COMPARISON:  04/12/2016 FINDINGS: Intraoperative fluoroscopy is utilized for surgical control purposes. Fluoroscopy time is recorded at 32 seconds. Three spot fluoroscopic images are obtained. Spot fluoroscopic images demonstrate placement of posterior pedicular screws with intervertebral disc prosthesis. It is difficult to determine the level of involvement due to the small field of view. Additional intervertebral disc prostheses are also visualized. IMPRESSION: Intraoperative fluoroscopy utilized for surgical control purposes demonstrating placement of posterior pedicular screws and intervertebral  disc prosthesis. Level of involvement is uncertain due to the small field of view. Electronically Signed   By: Lucienne Capers M.D.   On: 05/07/2016 00:00   Dg C-arm 1-60 Min  Result Date: 05/07/2016 CLINICAL DATA:  L2-3 posterior lumbar interbody fusion. EXAM: DG C-ARM 61-120 MIN; LUMBAR SPINE - 2-3 VIEW COMPARISON:  04/12/2016 FINDINGS: Intraoperative fluoroscopy is utilized for surgical control purposes. Fluoroscopy time is recorded at 32 seconds. Three spot fluoroscopic images are obtained. Spot fluoroscopic images demonstrate placement of posterior pedicular screws with intervertebral disc prosthesis. It is difficult to determine the level of involvement due to the small field of view. Additional intervertebral disc prostheses are also visualized. IMPRESSION: Intraoperative fluoroscopy utilized for surgical control purposes demonstrating placement of posterior pedicular screws and intervertebral disc prosthesis. Level of involvement is uncertain due to the small field of view. Electronically Signed   By: Lucienne Capers M.D.   On: 05/07/2016 00:00    Assessment/Plan: 56 year old postop day 2 lumbar fusion with what appears to be postoperative ileus will restart her home medications which include Phenergan protonix we'll also place an NG tube.  LOS: 3 days     Trenita Hulme P 05/08/2016, 9:36 AM

## 2016-05-08 NOTE — Progress Notes (Signed)
Removal of NG tube, patient tolerated well.  Resting in bed.

## 2016-05-08 NOTE — Evaluation (Signed)
Occupational Therapy Evaluation Patient Details Name: Madeline Dickerson MRN: [id number] DOB: 05-06-1960 Today's Date: 05/08/2016    History of Present Illness Pt is a 56 y.o. female s/p posterior lumbar interbody fusion L2-3 with lumbar hardware removal secondary to fall. She has a PMH significant for anxiety, arthritis, chronic low back pain, depression, dyrrhythmia, fibromyalgia, GERD, hypertension, rotator cuff disorder, seasonal asthma, shortness of breath, previous lumbar microdiscectomy, lumbar spine surgery, and posterior lumbar fusion, spinal cord stimulator implant and removal.    Clinical Impression   PTA, pt was independent with ADL and functional mobility. Pt currently requires overall supervision for safety and VC's to adhere to back precautions during basic ADL. Educated pt on compensatory ADL strategies including use of wet-wipes for toileting, potential use of AE for LB ADL, 2-cup method for oral care at sink, and safe toilet transfers. She would benefit from continued OT services while admitted to improve independence with ADL and functional mobility. Do not anticipate OT follow-up once discharged. OT will continue to follow acutely to address LB ADL and shower transfers.    Follow Up Recommendations  No OT follow up;Supervision/Assistance - 24 hour    Equipment Recommendations  3 in 1 bedside commode;Other (comment) (AE kit)    Recommendations for Other Services       Precautions / Restrictions Precautions Precautions: Back Precaution Booklet Issued: No Precaution Comments: Reviewed precautions during ADL. Pt able to recall 2/3. Required Braces or Orthoses: Spinal Brace Spinal Brace: Lumbar corset (Initially no brace; after eval, order for brace ambulating) Restrictions Weight Bearing Restrictions: No      Mobility Bed Mobility Overal bed mobility: Needs Assistance Bed Mobility: Rolling;Sidelying to Sit Rolling: Supervision Sidelying to sit: Supervision        General bed mobility comments: Good demonstration of log roll technique. Supervision for safety.  Transfers Overall transfer level: Needs assistance Equipment used: Rolling walker (2 wheeled) Transfers: Sit to/from Stand Sit to Stand: Supervision         General transfer comment: Supervision for safety during sit<>stand.    Balance Overall balance assessment: Needs assistance Sitting-balance support: No upper extremity supported;Feet supported Sitting balance-Leahy Scale: Good     Standing balance support: Bilateral upper extremity supported;No upper extremity supported;During functional activity Standing balance-Leahy Scale: Fair Standing balance comment: Able to stand at sink without UE support for grooming tasks.                            ADL Overall ADL's : Needs assistance/impaired Eating/Feeding: Set up;Sitting   Grooming: Set up;Sitting   Upper Body Bathing: Supervision/ safety;Set up;Sitting   Lower Body Bathing: Sit to/from stand;Supervison/ safety   Upper Body Dressing : Supervision/safety;Set up;Sitting   Lower Body Dressing: Sit to/from stand;Supervision/safety   Toilet Transfer: Ambulation;RW;BSC;Supervision/safety Toilet Transfer Details (indicate cue type and reason): Simulated with sit<>stand followed by ambulation. Toileting- Clothing Manipulation and Hygiene: Sit to/from stand;Cueing for back precautions;Supervision/safety       Functional mobility during ADLs: Rolling walker;Supervision/safety General ADL Comments: Pt educated on dressing techniques, bathing techniques, use of AE for ADL to improve adherence to back precautions, and 2 cup method for oral care at sink.     Vision Patient Visual Report: No change from baseline Vision Assessment?: No apparent visual deficits     Perception     Praxis      Pertinent Vitals/Pain Pain Assessment: Faces Faces Pain Scale: Hurts even more Pain Location: back at incision Pain  Descriptors / Indicators: Grimacing;Operative site guarding;Sore;Aching Pain Intervention(s): Monitored during session;Repositioned     Hand Dominance Right   Extremity/Trunk Assessment Upper Extremity Assessment Upper Extremity Assessment: Generalized weakness   Lower Extremity Assessment Lower Extremity Assessment: Generalized weakness   Cervical / Trunk Assessment Cervical / Trunk Assessment: Other exceptions Cervical / Trunk Exceptions: s/p spinal surgery   Communication Communication Communication: No difficulties   Cognition Arousal/Alertness: Awake/alert Behavior During Therapy: WFL for tasks assessed/performed Overall Cognitive Status: Within Functional Limits for tasks assessed                 General Comments: Easily distracted by pain/nausea this session.   General Comments       Exercises       Shoulder Instructions      Home Living Family/patient expects to be discharged to:: Private residence Living Arrangements: Alone Available Help at Discharge: Family;Available PRN/intermittently (Available "most of the day") Type of Home: Mobile home Home Access: Stairs to enter Entrance Stairs-Number of Steps: 6 Entrance Stairs-Rails: Right Home Layout: One level     Bathroom Shower/Tub: Tub/shower unit Shower/tub characteristics: Curtain Biochemist, clinical: Standard     Home Equipment: Environmental consultant - 2 wheels          Prior Functioning/Environment Level of Independence: Independent                 OT Problem List: Decreased strength;Decreased range of motion;Decreased activity tolerance;Impaired balance (sitting and/or standing);Decreased safety awareness;Decreased knowledge of use of DME or AE;Decreased knowledge of precautions;Pain      OT Treatment/Interventions: Self-care/ADL training;Therapeutic exercise;Energy conservation;DME and/or AE instruction;Therapeutic activities;Patient/family education;Balance training    OT Goals(Current goals  can be found in the care plan section) Acute Rehab OT Goals Patient Stated Goal: to have less nausea OT Goal Formulation: With patient Time For Goal Achievement: 05/22/16 Potential to Achieve Goals: Good ADL Goals Pt Will Perform Lower Body Bathing: with modified independence;with adaptive equipment;sit to/from stand Pt Will Perform Lower Body Dressing: with modified independence;with adaptive equipment;sit to/from stand Pt Will Transfer to Toilet: with modified independence;ambulating;bedside commode (BSC over toilet) Pt Will Perform Toileting - Clothing Manipulation and hygiene: with modified independence;sit to/from stand Pt Will Perform Tub/Shower Transfer: Tub transfer;with modified independence;3 in 1;rolling walker Additional ADL Goal #1: Pt will recall and adhere to 3/3 back precautions during morning ADL routine.  OT Frequency: Min 2X/week   Barriers to D/C:            Co-evaluation              End of Session Equipment Utilized During Treatment: Rolling walker  Activity Tolerance: Patient tolerated treatment well Patient left: in chair;with call bell/phone within reach;with chair alarm set  OT Visit Diagnosis: Other abnormalities of gait and mobility (R26.89);Unsteadiness on feet (R26.81)                ADL either performed or assessed with clinical judgement  Time: 0914-0930 OT Time Calculation (min): 16 min Charges:  OT General Charges $OT Visit: 1 Procedure OT Evaluation $OT Eval Moderate Complexity: 1 Procedure G-Codes:     Norman Herrlich, MS OTR/L  Pager: [place] A Aryiah Monterosso 05/08/2016, 10:49 AM

## 2016-05-08 NOTE — Progress Notes (Signed)
16 french nasogastric tube placed left nare.  Patient tolerated well.  Resting in bed with HOB elevated.  No noted distress.  Will continue to monitor.

## 2016-05-09 NOTE — Care Management Important Message (Signed)
Important Message  Patient Details  Name: Madeline Dickerson MRN: [id number] Date of Birth: 05-12-1960   Medicare Important Message Given:  Yes    Orbie Pyo 05/09/2016, 12:14 PM

## 2016-05-09 NOTE — Progress Notes (Signed)
Patient ID: Madeline Dickerson, female   DOB: Sep 16, 1960, 56 y.o.   MRN: [id number] BP 124/84 (BP Location: Left Arm)   Pulse 100   Temp 98.8 F (37.1 C) (Oral)   Resp 16   Ht 5\' 7"  (1.702 m)   Wt 96.3 kg (212 lb 3.2 oz)   LMP 08/28/2012   SpO2 100%   BMI 33.24 kg/m  Bowel movement today Moving well Wounds are clean, dry, no signs of infection Discharge tomorrow

## 2016-05-09 NOTE — Care Management Important Message (Signed)
Important Message  Patient Details  Name: Madeline Dickerson MRN: [id number] Date of Birth: August 01, 1960   Medicare Important Message Given:  Yes    Orbie Pyo 05/09/2016, 12:15 PM

## 2016-05-09 NOTE — Progress Notes (Signed)
Physical Therapy Treatment Patient Details Name: Madeline Dickerson MRN: [id number] DOB: 03/27/1960 Today's Date: 05/09/2016    History of Present Illness Pt is a 56 y.o. female s/p posterior lumbar interbody fusion L2-3 with lumbar hardware removal secondary to fall. She has a PMH significant for anxiety, depression, dyrrhythmia, fibromyalgia, HTN, rotator cuff disorder, SOB, previous lumbar microdiscectomy, lumbar spine surgery, and posterior lumbar fusion, spinal cord stimulator implant and removal. Pt with post-op ileus.    PT Comments    Patient progressing slowly as not on solid food yet and experiencing weakness during ambulation.  Still able to demonstrate safe ambulation, but was unable to achieve distance to practice stairs today.  Encouraged ambulation later with nursing staff if PT unable to return.   Follow Up Recommendations  Home health PT;Supervision for mobility/OOB     Equipment Recommendations  3in1 (PT)    Recommendations for Other Services       Precautions / Restrictions Precautions Precautions: Fall Required Braces or Orthoses: Spinal Brace Spinal Brace: Lumbar corset    Mobility  Bed Mobility               General bed mobility comments: up in chair, and wished to remain up in chair longer today  Transfers Overall transfer level: Needs assistance Equipment used: Rolling walker (2 wheeled) Transfers: Sit to/from Stand Sit to Stand: Supervision         General transfer comment: increased time, heavy UE support, slow and antalgic  Ambulation/Gait Ambulation/Gait assistance: Min guard Ambulation Distance (Feet): 175 Feet Assistive device: Rolling walker (2 wheeled) Gait Pattern/deviations: Step-through pattern;Decreased stride length;Shuffle;Trunk flexed     General Gait Details: initially walking well and agrees to walk to practice stairs, but developed weakness and felt needed to rest (still on clear liquids only), returned to room with  increased assist from supervision to minguard for safety due to weakness.     Stairs            Wheelchair Mobility    Modified Rankin (Stroke Patients Only)       Balance Overall balance assessment: Needs assistance Sitting-balance support: Feet supported Sitting balance-Leahy Scale: Good     Standing balance support: Bilateral upper extremity supported Standing balance-Leahy Scale: Poor Standing balance comment: UE support needed due to weakness                    Cognition Arousal/Alertness: Awake/alert Behavior During Therapy: WFL for tasks assessed/performed Overall Cognitive Status: Within Functional Limits for tasks assessed                      Exercises      General Comments General comments (skin integrity, edema, etc.): Reviewed bed mobility technique with pt verbally; discussed ambulating again later today with PT versus nursing assist.       Pertinent Vitals/Pain Faces Pain Scale: Hurts little more Pain Location: back at incision Pain Descriptors / Indicators: Discomfort;Sore;Operative site guarding Pain Intervention(s): Monitored during session;Repositioned;Limited activity within patient's tolerance    Home Living                      Prior Function            PT Goals (current goals can now be found in the care plan section) Progress towards PT goals: Progressing toward goals    Frequency    Min 5X/week      PT Plan Current plan remains appropriate  Co-evaluation             End of Session Equipment Utilized During Treatment: Back brace Activity Tolerance: Patient limited by fatigue Patient left: in chair;with call bell/phone within reach;with chair alarm set   PT Visit Diagnosis: Unsteadiness on feet (R26.81);Pain Pain - part of body:  (back)     Time: 3007-6226 PT Time Calculation (min) (ACUTE ONLY): 15 min  Charges:  $Gait Training: 8-22 mins                    G Codes:        Reginia Naas 2016/05/27, 9:50 AM  Magda Kiel, PT [phone] May 27, 2016

## 2016-05-10 MED ORDER — TIZANIDINE HCL 4 MG PO TABS: 4.0000 mg | ORAL_TABLET | Freq: Four times a day (QID) | ORAL | 0 refills | Status: DC | PRN

## 2016-05-10 MED ORDER — OXYCODONE HCL 5 MG PO TABS: 5.0000 mg | ORAL_TABLET | Freq: Four times a day (QID) | ORAL | 0 refills | Status: DC | PRN

## 2016-05-10 NOTE — Progress Notes (Signed)
Pt discharged home. Discharge instructions were reviewed with pt. Pt verbalized understanding.   

## 2016-05-10 NOTE — Discharge Instructions (Signed)

## 2016-05-10 NOTE — Care Management Note (Signed)
Case Management Note  Patient Details  Name: Madeline Dickerson MRN: [id number] Date of Birth: 11/14/1960  Subjective/Objective:                    Action/Plan: Pt discharging home with orders for HH services. CM spoke to Dr Cabbell and he was in agreement with HH PT. CM met with the patient and provided her a list of HH agencies. She selected [facility]. Karen with [facility] notified and accepted the referral. Pt asking that HH not start for a couple of days since she is going to her daughter's home with her big dog. CM informed Karen that it may be a couple days before the patient is available for HH services. Pt provided [phone] as her contact number which was given to [facility]. Patient also with orders for cane and 3 in 1. Jermaine with [facility] DME notified and will deliver the equipment to the room.  Pt has transportation home.  Expected Discharge Date:  05/10/16               Expected Discharge Plan:  Home w Home Health Services  In-House Referral:     Discharge planning Services  CM Consult  Post Acute Care Choice:  Durable Medical Equipment, Home Health Choice offered to:  Patient  DME Arranged:  3-N-1, Cane DME Agency:  [facility]  HH Arranged:  PT HH Agency:  [facility]  Status of Service:  Completed, signed off  If discussed at Long Length of Stay Meetings, dates discussed:    Additional Comments:   F , RN 05/10/2016, 2:15 PM  

## 2016-05-10 NOTE — Progress Notes (Addendum)
Occupational Therapy Treatment/Discharge Patient Details Name: Madeline Dickerson MRN: [id number] DOB: 1961-01-16 Today's Date: 05/10/2016    History of present illness Pt is a 56 y.o. female s/p posterior lumbar interbody fusion L2-3 with lumbar hardware removal secondary to fall. She has a PMH significant for anxiety, depression, dyrrhythmia, fibromyalgia, HTN, rotator cuff disorder, SOB, previous lumbar microdiscectomy, lumbar spine surgery, and posterior lumbar fusion, spinal cord stimulator implant and removal. Pt with post-op ileus.   OT comments  Pt progressing well toward OT goals. She demonstrates ability to complete all standing ADL at supervision level to ensure safety. Pt will have 24 hour supervision at home post-acute D/C from her daughter. Educated pt concerning safe shower transfer techniques, back precautions during IADL tasks, and brace wear schedule and she demonstrates understanding. Pt reports no further questions/concerns and no further OT needs identified. OT will sign off.   Follow Up Recommendations  No OT follow up;Supervision/Assistance - 24 hour    Equipment Recommendations  3 in 1 bedside commode;Other (comment) (AE kit)    Recommendations for Other Services      Precautions / Restrictions Precautions Precautions: Fall Precaution Comments: Reviewed precautions during ADL. Able to recall 3/3. Required Braces or Orthoses: Spinal Brace Spinal Brace: Lumbar corset;Applied in sitting position Restrictions Weight Bearing Restrictions: No       Mobility Bed Mobility               General bed mobility comments: OOB in bathroom on arrival.  Transfers Overall transfer level: Needs assistance Equipment used: Rolling walker (2 wheeled) Transfers: Sit to/from Stand Sit to Stand: Supervision         General transfer comment: for safety    Balance Overall balance assessment: Needs assistance Sitting-balance support: Feet supported;No upper extremity  supported Sitting balance-Leahy Scale: Good     Standing balance support: No upper extremity supported;During functional activity Standing balance-Leahy Scale: Fair Standing balance comment: Able to statically stand to complete ADL tasks without UE support.                   ADL Overall ADL's : Needs assistance/impaired     Grooming: Supervision/safety;Standing               Lower Body Dressing: Sit to/from stand;Supervision/safety   Toilet Transfer: Supervision/safety;Ambulation;BSC Toilet Transfer Details (indicate cue type and reason): BSC over toilet. Ambulated without RW. Toileting- Clothing Manipulation and Hygiene: Sit to/from stand;Supervision/safety;Adhering to back precautions       Functional mobility during ADLs: Rolling walker;Supervision/safety General ADL Comments: Educated pt on brace wear schedule and back precautions during IADL tasks this session. She was able to maintain during simulated home management tasks this session and verbalize technique for safe shower transfers.      Vision                     Perception     Praxis      Cognition   Behavior During Therapy: WFL for tasks assessed/performed Overall Cognitive Status: Within Functional Limits for tasks assessed                         Exercises     Shoulder Instructions       General Comments      Pertinent Vitals/ Pain       Pain Assessment: Faces Faces Pain Scale: Hurts little more Pain Location: L side (gas) Pain Descriptors / Indicators: Discomfort Pain Intervention(s): Monitored  during session;Repositioned  Home Living                                          Prior Functioning/Environment              Frequency  Min 2X/week        Progress Toward Goals  OT Goals(current goals can now be found in the care plan section)  Progress towards OT goals: Progressing toward goals  Acute Rehab OT Goals Patient Stated Goal:  to have less nausea OT Goal Formulation: With patient Time For Goal Achievement: 05/22/16 Potential to Achieve Goals: Good ADL Goals Pt Will Perform Lower Body Bathing: with modified independence;with adaptive equipment;sit to/from stand Pt Will Perform Lower Body Dressing: with modified independence;with adaptive equipment;sit to/from stand Pt Will Transfer to Toilet: with modified independence;ambulating;bedside commode (BSC over toilet) Pt Will Perform Toileting - Clothing Manipulation and hygiene: with modified independence;sit to/from stand Pt Will Perform Tub/Shower Transfer: Tub transfer;with modified independence;3 in 1;rolling walker Additional ADL Goal #1: Pt will recall and adhere to 3/3 back precautions during morning ADL routine.  Plan Discharge plan remains appropriate    Co-evaluation                 End of Session Equipment Utilized During Treatment: Rolling walker  OT Visit Diagnosis: Other abnormalities of gait and mobility (R26.89);Unsteadiness on feet (R26.81)   Activity Tolerance Patient tolerated treatment well   Patient Left in chair;with call bell/phone within reach;with chair alarm set   Nurse Communication Mobility status        Time: 8441-7127 OT Time Calculation (min): 17 min  Charges: OT General Charges $OT Visit: 1 Procedure OT Treatments $Self Care/Home Management : 8-22 mins  Norman Herrlich, MS OTR/L  Pager: [place] A Endy Easterly 05/10/2016, 4:49 PM

## 2016-05-10 NOTE — Progress Notes (Signed)
Physical Therapy Treatment Patient Details Name: Madeline Dickerson MRN: [id number] DOB: 06-22-1960 Today's Date: 05/10/2016    History of Present Illness Pt is a 56 y.o. female s/p posterior lumbar interbody fusion L2-3 with lumbar hardware removal secondary to fall. She has a PMH significant for anxiety, depression, dyrrhythmia, fibromyalgia, HTN, rotator cuff disorder, SOB, previous lumbar microdiscectomy, lumbar spine surgery, and posterior lumbar fusion, spinal cord stimulator implant and removal. Pt with post-op ileus.    PT Comments    Patient progressing with ability to practice steps this session.  Demonstrated safe technique for supine to sit and progressing with endurance with ambulation as well.  Pt plans to d/c initially to daughter's home.  Continue to feel HHPT indicated at d/c.    Follow Up Recommendations  Home health PT;Supervision for mobility/OOB     Equipment Recommendations  3in1 (PT)    Recommendations for Other Services       Precautions / Restrictions Precautions Precautions: Fall Required Braces or Orthoses: Spinal Brace Spinal Brace: Lumbar corset;Applied in sitting position    Mobility  Bed Mobility Overal bed mobility: Needs Assistance Bed Mobility: Rolling;Sidelying to Sit Rolling: Modified independent (Device/Increase time) Sidelying to sit: Modified independent (Device/Increase time)       General bed mobility comments: initially attempted to get up incorrect, then reminded herself how to get up with precautions  Transfers Overall transfer level: Needs assistance Equipment used: Rolling walker (2 wheeled) Transfers: Sit to/from Stand Sit to Stand: Supervision         General transfer comment: for safety  Ambulation/Gait Ambulation/Gait assistance: Supervision Ambulation Distance (Feet): 220 Feet Assistive device: Rolling walker (2 wheeled) Gait Pattern/deviations: Step-through pattern;Trunk flexed;Decreased stride length      General Gait Details: cues for posture, proximity to walker   Stairs Stairs: Yes   Stair Management: One rail Right;Step to pattern;Sideways Number of Stairs: 2 General stair comments: demonstration then pt performed with safe technique  Wheelchair Mobility    Modified Rankin (Stroke Patients Only)       Balance Overall balance assessment: Needs assistance   Sitting balance-Leahy Scale: Good       Standing balance-Leahy Scale: Fair                      Cognition Arousal/Alertness: Awake/alert Behavior During Therapy: WFL for tasks assessed/performed Overall Cognitive Status: Within Functional Limits for tasks assessed                      Exercises      General Comments General comments (skin integrity, edema, etc.): pt wished to practice stepping into tub, reviewed technique and pt practiced while in therapy room      Pertinent Vitals/Pain Faces Pain Scale: Hurts even more Pain Location: L side (gas) Pain Descriptors / Indicators: Discomfort Pain Intervention(s): Monitored during session;Repositioned    Home Living                      Prior Function            PT Goals (current goals can now be found in the care plan section) Progress towards PT goals: Progressing toward goals    Frequency    Min 5X/week      PT Plan      Co-evaluation             End of Session Equipment Utilized During Treatment: Gait belt Activity Tolerance: Patient tolerated treatment well Patient  left: in bed;with call bell/phone within reach   PT Visit Diagnosis: Pain;Unsteadiness on feet (R26.81)     Time: 5672-0919 PT Time Calculation (min) (ACUTE ONLY): 24 min  Charges:  $Gait Training: 23-37 mins                    G Codes:       Reginia Naas 2016/06/01, 10:38 AM  Magda Kiel, [place] 06/01/16

## 2016-05-10 NOTE — Discharge Summary (Signed)
Physician Discharge Summary  Patient ID: Madeline Dickerson MRN: [id number] DOB/AGE: 1960-12-12 56 y.o.  Admit date: 05/05/2016 Discharge date: 05/10/2016  Admission Diagnoses:Hnp L/3  Discharge Diagnoses:  Active Problems:   HNP (herniated nucleus pulposus), lumbar   Discharged Condition: good  Hospital Course: Madeline Dickerson was admitted and taken to the operating room for an uncomplicated lumbAr discetomy, and fusion at L2/3. Her right side L2 pedicle was too small to accept a pedicle screw, so hardware was only placed on the left side. Two cages were placed. Post op her wound is clean, dry, and without signs of infection. She is moving all extremities well. She is voiding, and tolerating a regular diet.   Treatments: surgery: POSTERIOR LUMBAR INTERBODY FUSION LUMBAR 2- LUMBAR 3, Peek interbody cages 10 x66mm with autograft morsels  L2 laminectomy beyond needed exposure for a plif Non segmental pedicle screw fixation L2-3 nuvasive mas plif screws. New screw to replace previous screw at L3 on the left, 6.77mm x20mm  LUMBAR HARDWARE REMOVAL L3,4,5, S1     Discharge Exam: Blood pressure (!) 141/71, pulse 99, temperature 98.4 F (36.9 C), temperature source Oral, resp. rate 17, height 5\' 7"  (1.702 m), weight 96.3 kg (212 lb 3.2 oz), last menstrual period 08/28/2012, SpO2 98 %. General appearance: alert, cooperative, appears older than stated age and mild distress Neurologic: Alert and oriented X 3, normal strength and tone. Normal symmetric reflexes. Normal coordination and gait  Disposition: 01-Home or Self Care LUMBAR TWO-THREE HERNIATED NUCLEUS PULPOSUS, LUMBAR TWO-THREE SPONDYLOLISTHESIS  Allergies as of 05/10/2016      Reactions   Haldol [haloperidol Lactate] Other (See Comments)   Couldn't talk, couldn't move    Lyrica [pregabalin] Nausea Only, Other (See Comments)   Dizziness      Medication List    TAKE these medications   albuterol 108 (90 Base) MCG/ACT inhaler Commonly known  as:  PROVENTIL HFA;VENTOLIN HFA Inhale 2 puffs into the lungs every 6 (six) hours as needed for wheezing or shortness of breath.   budesonide-formoterol 80-4.5 MCG/ACT inhaler Commonly known as:  SYMBICORT Inhale 2 puffs into the lungs 2 (two) times daily. What changed:  when to take this  reasons to take this   cyclobenzaprine 10 MG tablet Commonly known as:  FLEXERIL Take 1 tablet (10 mg total) by mouth 3 (three) times daily as needed for muscle spasms.   diazepam 10 MG tablet Commonly known as:  VALIUM Take 1 tablet (10 mg total) by mouth 2 (two) times daily as needed for anxiety. What changed:  how much to take  reasons to take this   diclofenac 1.3 % Ptch Commonly known as:  FLECTOR Place 1 patch onto the skin 2 (two) times daily.   FLUoxetine 40 MG capsule Commonly known as:  PROZAC Take 1 capsule (40 mg total) by mouth daily. What changed:  how much to take   ibuprofen 800 MG tablet Commonly known as:  ADVIL,MOTRIN Take 1 tablet (800 mg total) by mouth every 8 (eight) hours as needed. What changed:  reasons to take this   nicotine 14 mg/24hr patch Commonly known as:  NICODERM CQ - dosed in mg/24 hours Place 1 patch (14 mg total) onto the skin daily.   omeprazole 40 MG capsule Commonly known as:  PRILOSEC Take 40 mg by mouth daily.   oxyCODONE 5 MG immediate release tablet Commonly known as:  ROXICODONE Take 1 tablet (5 mg total) by mouth every 6 (six) hours as needed for severe pain.  oxyCODONE-acetaminophen 5-325 MG tablet Commonly known as:  PERCOCET/ROXICET Take 1-2 tablets by mouth every 4 (four) hours as needed for severe pain.   predniSONE 10 MG (21) Tbpk tablet Commonly known as:  STERAPRED UNI-PAK 21 TAB Take 1 tablet (10 mg total) by mouth daily. Take 6 tabs by mouth daily  for 2 days, then 5 tabs for 2 days, then 4 tabs for 2 days, then 3 tabs for 2 days, 2 tabs for 2 days, then 1 tab by mouth daily for 2 days   promethazine 25 MG  tablet Commonly known as:  PHENERGAN Take 25 mg by mouth every 6 (six) hours as needed for nausea/vomiting.   SUBOXONE 8-2 MG Film Generic drug:  Buprenorphine HCl-Naloxone HCl Place 1 Film under the tongue 2 (two) times daily.   tiotropium 18 MCG inhalation capsule Commonly known as:  SPIRIVA HANDIHALER Place 1 capsule (18 mcg total) into inhaler and inhale every morning. What changed:  when to take this   tiZANidine 4 MG tablet Commonly known as:  ZANAFLEX Take 1 tablet (4 mg total) by mouth every 6 (six) hours as needed for muscle spasms.   valsartan-hydrochlorothiazide 160-12.5 MG tablet Commonly known as:  DIOVAN-HCT Take 1 tablet by mouth daily.      Follow-up Information    Candler Ginsberg L, MD Follow up in 3 week(s).   Specialty:  Neurosurgery Why:  please call to make an appointment for 3 weeks Contact information: [address] [place] [address] [phone]           Signed: Winfield Cunas 05/10/2016, 1:04 PM

## 2016-05-16 DIAGNOSIS — Z981 Arthrodesis status: Secondary | ICD-10-CM | POA: Diagnosis not present

## 2016-05-16 DIAGNOSIS — Z4789 Encounter for other orthopedic aftercare: Secondary | ICD-10-CM | POA: Diagnosis not present

## 2016-05-19 DIAGNOSIS — Z4789 Encounter for other orthopedic aftercare: Secondary | ICD-10-CM | POA: Diagnosis not present

## 2016-05-19 DIAGNOSIS — Z981 Arthrodesis status: Secondary | ICD-10-CM | POA: Diagnosis not present

## 2016-05-24 DIAGNOSIS — Z981 Arthrodesis status: Secondary | ICD-10-CM | POA: Diagnosis not present

## 2016-05-24 DIAGNOSIS — Z4789 Encounter for other orthopedic aftercare: Secondary | ICD-10-CM | POA: Diagnosis not present

## 2016-05-26 DIAGNOSIS — Z79891 Long term (current) use of opiate analgesic: Secondary | ICD-10-CM | POA: Diagnosis not present

## 2016-06-08 DIAGNOSIS — M479 Spondylosis, unspecified: Secondary | ICD-10-CM | POA: Diagnosis not present

## 2016-06-09 DIAGNOSIS — Z79891 Long term (current) use of opiate analgesic: Secondary | ICD-10-CM | POA: Diagnosis not present

## 2016-06-17 ENCOUNTER — Encounter (HOSPITAL_COMMUNITY): Payer: Self-pay | Admitting: Emergency Medicine

## 2016-06-17 ENCOUNTER — Emergency Department (HOSPITAL_COMMUNITY): Payer: Medicare Other

## 2016-06-17 ENCOUNTER — Emergency Department (HOSPITAL_COMMUNITY)
Admission: EM | Admit: 2016-06-17 | Discharge: 2016-06-17 | Disposition: A | Discharge status: Home / Self Care | Payer: Medicare Other | Attending: Emergency Medicine | Admitting: Emergency Medicine

## 2016-06-17 DIAGNOSIS — R10817 Generalized abdominal tenderness: Secondary | ICD-10-CM | POA: Diagnosis not present

## 2016-06-17 DIAGNOSIS — Z79899 Other long term (current) drug therapy: Secondary | ICD-10-CM | POA: Insufficient documentation

## 2016-06-17 DIAGNOSIS — F17210 Nicotine dependence, cigarettes, uncomplicated: Secondary | ICD-10-CM | POA: Insufficient documentation

## 2016-06-17 DIAGNOSIS — R1084 Generalized abdominal pain: Secondary | ICD-10-CM | POA: Diagnosis not present

## 2016-06-17 DIAGNOSIS — K2970 Gastritis, unspecified, without bleeding: Secondary | ICD-10-CM | POA: Diagnosis not present

## 2016-06-17 DIAGNOSIS — R110 Nausea: Secondary | ICD-10-CM

## 2016-06-17 DIAGNOSIS — I10 Essential (primary) hypertension: Secondary | ICD-10-CM | POA: Insufficient documentation

## 2016-06-17 DIAGNOSIS — J449 Chronic obstructive pulmonary disease, unspecified: Secondary | ICD-10-CM | POA: Insufficient documentation

## 2016-06-17 DIAGNOSIS — R112 Nausea with vomiting, unspecified: Secondary | ICD-10-CM | POA: Insufficient documentation

## 2016-06-17 LAB — URINALYSIS, ROUTINE W REFLEX MICROSCOPIC
Bacteria, UA: NONE SEEN
Bilirubin Urine: NEGATIVE
Glucose, UA: NEGATIVE mg/dL
Ketones, ur: NEGATIVE mg/dL
Leukocytes, UA: NEGATIVE
Nitrite: NEGATIVE
Protein, ur: NEGATIVE mg/dL
Specific Gravity, Urine: 1.004 — ABNORMAL LOW (ref 1.005–1.030)
pH: 7 (ref 5.0–8.0)

## 2016-06-17 LAB — COMPREHENSIVE METABOLIC PANEL
ALT: 11 U/L — ABNORMAL LOW (ref 14–54)
AST: 20 U/L (ref 15–41)
Albumin: 4.1 g/dL (ref 3.5–5.0)
Alkaline Phosphatase: 76 U/L (ref 38–126)
Anion gap: 12 (ref 5–15)
BUN: 12 mg/dL (ref 6–20)
CO2: 21 mmol/L — ABNORMAL LOW (ref 22–32)
Calcium: 9.3 mg/dL (ref 8.9–10.3)
Chloride: 102 mmol/L (ref 101–111)
Creatinine, Ser: 0.8 mg/dL (ref 0.44–1.00)
GFR calc Af Amer: >60 mL/min (ref >60)
GFR calc non Af Amer: >60 mL/min (ref >60)
Glucose, Bld: 117 mg/dL — ABNORMAL HIGH (ref 65–99)
Potassium: 2.7 mmol/L — CL (ref 3.5–5.1)
Sodium: 135 mmol/L (ref 135–145)
Total Bilirubin: 0.4 mg/dL (ref 0.3–1.2)
Total Protein: 7.3 g/dL (ref 6.5–8.1)

## 2016-06-17 LAB — CBC WITH DIFFERENTIAL/PLATELET
Basophils Absolute: 0 10*3/uL (ref 0.0–0.1)
Basophils Relative: 0 %
Eosinophils Absolute: 0.2 10*3/uL (ref 0.0–0.7)
Eosinophils Relative: 2 %
HCT: 35.4 % — ABNORMAL LOW (ref 36.0–46.0)
Hemoglobin: 12.2 g/dL (ref 12.0–15.0)
Lymphocytes Relative: 20 %
Lymphs Abs: 2.3 10*3/uL (ref 0.7–4.0)
MCH: 30.6 pg (ref 26.0–34.0)
MCHC: 34.5 g/dL (ref 30.0–36.0)
MCV: 88.7 fL (ref 78.0–100.0)
Monocytes Absolute: 1 10*3/uL (ref 0.1–1.0)
Monocytes Relative: 9 %
Neutro Abs: 8.1 10*3/uL — ABNORMAL HIGH (ref 1.7–7.7)
Neutrophils Relative %: 69 %
Platelets: 451 10*3/uL — ABNORMAL HIGH (ref 150–400)
RBC: 3.99 MIL/uL (ref 3.87–5.11)
RDW: 14.3 % (ref 11.5–15.5)
WBC: 11.7 10*3/uL — ABNORMAL HIGH (ref 4.0–10.5)

## 2016-06-17 LAB — MAGNESIUM: Magnesium: 1.6 mg/dL — ABNORMAL LOW (ref 1.7–2.4)

## 2016-06-17 LAB — I-STAT TROPONIN, ED: Troponin i, poc: 0 ng/mL (ref 0.00–0.08)

## 2016-06-17 LAB — LIPASE, BLOOD: Lipase: 18 U/L (ref 11–51)

## 2016-06-17 MED ORDER — PROMETHAZINE HCL 25 MG/ML IJ SOLN
25.0000 mg | Freq: Once | INTRAMUSCULAR | Status: AC
  Administered 2016-06-17: 25 mg via INTRAVENOUS
  Filled 2016-06-17: qty 1

## 2016-06-17 MED ORDER — GI COCKTAIL ~~LOC~~
30.0000 mL | Freq: Once | ORAL | Status: AC
  Administered 2016-06-17: 30 mL via ORAL
  Filled 2016-06-17: qty 30

## 2016-06-17 MED ORDER — DIAZEPAM 5 MG/ML IJ SOLN
10.0000 mg | Freq: Once | INTRAMUSCULAR | Status: AC
  Administered 2016-06-17: 10 mg via INTRAVENOUS
  Filled 2016-06-17: qty 2

## 2016-06-17 MED ORDER — ONDANSETRON HCL 4 MG/2ML IJ SOLN
4.0000 mg | Freq: Once | INTRAMUSCULAR | Status: AC
  Administered 2016-06-17: 4 mg via INTRAVENOUS
  Filled 2016-06-17: qty 2

## 2016-06-17 MED ORDER — CHLORDIAZEPOXIDE HCL 25 MG PO CAPS: ORAL_CAPSULE | ORAL | 0 refills | Status: DC

## 2016-06-17 MED ORDER — POTASSIUM CHLORIDE CRYS ER 20 MEQ PO TBCR: 20.0000 meq | EXTENDED_RELEASE_TABLET | Freq: Two times a day (BID) | ORAL | 0 refills | Status: DC

## 2016-06-17 MED ORDER — MAGNESIUM SULFATE 2 GM/50ML IV SOLN
2.0000 g | Freq: Once | INTRAVENOUS | Status: AC
  Administered 2016-06-17: 2 g via INTRAVENOUS
  Filled 2016-06-17: qty 50

## 2016-06-17 MED ORDER — LORAZEPAM 2 MG/ML IJ SOLN
2.0000 mg | Freq: Once | INTRAMUSCULAR | Status: AC
  Administered 2016-06-17: 2 mg via INTRAVENOUS
  Filled 2016-06-17: qty 1

## 2016-06-17 MED ORDER — SODIUM CHLORIDE 0.9 % IV BOLUS (SEPSIS)
1000.0000 mL | Freq: Once | INTRAVENOUS | Status: AC
  Administered 2016-06-17: 1000 mL via INTRAVENOUS

## 2016-06-17 MED ORDER — SODIUM CHLORIDE 0.9 % IV SOLN: 30.0000 meq | Freq: Once | INTRAVENOUS | Status: DC

## 2016-06-17 MED ORDER — POTASSIUM CHLORIDE 10 MEQ/100ML IV SOLN
10.0000 meq | INTRAVENOUS | Status: AC
  Administered 2016-06-17 (×3): 10 meq via INTRAVENOUS
  Filled 2016-06-17 (×3): qty 100

## 2016-06-17 MED ORDER — METOCLOPRAMIDE HCL 10 MG PO TABS: 10.0000 mg | ORAL_TABLET | Freq: Four times a day (QID) | ORAL | 0 refills | Status: DC

## 2016-06-17 NOTE — ED Notes (Signed)
ED Provider at bedside. 

## 2016-06-17 NOTE — ED Triage Notes (Signed)
Patient states she is not currently having any abdominal pain at present but that her nausea is rated a 10/10.  Patient then proceeds to state "I just don't know but I just cant take it".  Patient continuously moaning during triage.

## 2016-06-17 NOTE — ED Notes (Signed)
Standby  assist to BR and back to room. Pt ambulates w/o difficulty

## 2016-06-17 NOTE — ED Notes (Signed)
Pt continues to c/o nausea and dry heaving. Pt thrashing around in bed and hanging over the bed rail. Dr Oleta Mouse made aware

## 2016-06-17 NOTE — ED Notes (Signed)
Dr Oleta Mouse provided pt with diet coke. Pt drank approx 1/2 can w/o difficulty

## 2016-06-17 NOTE — Discharge Instructions (Signed)
Please call your PCP regarding refill for valium. Take medications as prescribed. Your potassium was low, and you will need to take supplementation as well.  Please return without fail for worsening symptoms.

## 2016-06-17 NOTE — ED Triage Notes (Signed)
Per EMS patient from home with c/o intermittent nausea x 5 days.  Denies vomiting, diarrhea.

## 2016-06-17 NOTE — ED Provider Notes (Signed)
[facility] DEPT Provider Note   CSN: [id number] Arrival date & time: 06/17/16  1430     History   Chief Complaint Chief Complaint  Patient presents with  . Nausea    HPI Madeline Dickerson is a 56 y.o. female.  The history is provided by the patient.  Emesis   This is a new problem. The current episode started more than 2 days ago. The problem occurs continuously. The problem has not changed since onset.The emesis has an appearance of stomach contents. There has been no fever. The fever has been present for less than 1 day. Associated symptoms include abdominal pain, chills and a fever. Pertinent negatives include no cough, no diarrhea and no URI.   56 year old female who presents with nausea and vomiting. She has a history of hypertension, fibromyalgia, depression, chronic pain on Suboxone. Reports intractable nausea and vomiting since Monday, nonbloody and nonbilious. With intermittent generalized abdominal discomfort and no diarrhea. States that she has had normal bowel movement this morning. Has not tried any medications for her nausea or vomiting, but did take Suboxone this morning. Denies any chest pain, difficulty breathing or cough. No dysuria or urinary frequency. Reports low-grade fever of 99. No other aggravating or alleviating factors.   Past Medical History:  Diagnosis Date  . Anxiety    hx  . Arthritis    "lower back, hands" (05/01/2015)  . Chronic low back pain    disk s/p 4 diskectomies, 5th fursion, then spinal cord stimulator ([place])  . Depression   . Dysrhythmia    irruglar heart beat "nothing wrong"   . Fibromyalgia   . GERD (gastroesophageal reflux disease)   . Hypertension   . Pneumonia 05/01/2015  . Rotator cuff disorder   . Seasonal asthma    "seasonal"  . Shortness of breath dyspnea   . Walking pneumonia     Patient Active Problem List   Diagnosis Date Noted  . HNP (herniated nucleus pulposus), lumbar 05/05/2016  . Hyperkalemia 12/28/2015  .  COPD exacerbation ([place]) 12/25/2015  . Anemia 12/25/2015  . Shortness of breath 12/25/2015  . Cardiomegaly 12/25/2015  . Bipolar 1 disorder, mixed, moderate ([place]) 06/03/2015  . CAP (community acquired pneumonia) 04/30/2015  . Pneumonitis 04/30/2015  . Status asthmaticus 04/30/2015  . Acute respiratory failure with hypoxia ([place]) 04/30/2015  . Dyspnea 04/30/2015  . Hyponatremia, severe 04/30/2015  . Lumbar spondylosis 04/27/2011  . Screening for colon cancer 01/17/2011  . Abnormal EKG 01/17/2011  . Tobacco abuse 01/17/2011  . Tobacco abuse counseling 01/17/2011  . Hypertension   . Chronic low back pain     Past Surgical History:  Procedure Laterality Date  . BACK SURGERY    . CESAREAN SECTION  1986  . LUMBAR MICRODISCECTOMY  01/1990; 2003  . LUMBAR SPINE SURGERY  03/1991   "cleaned up scar tissue"  . POSTERIOR LUMBAR FUSION  2011; 04/27/2011  . REPAIR DURAL / CSF LEAK  02/1990  . SPINAL CORD STIMULATOR IMPLANT  2012  . SPINAL CORD STIMULATOR REMOVAL  08/2010  . TUBAL LIGATION  1986    OB History    No data available       Home Medications    Prior to Admission medications   Medication Sig Start Date End Date Taking? Authorizing Provider  albuterol (PROVENTIL HFA;VENTOLIN HFA) 108 (90 Base) MCG/ACT inhaler Inhale 2 puffs into the lungs every 6 (six) hours as needed for wheezing or shortness of breath. 12/28/15  Yes Nishant Dhungel, MD  budesonide-formoterol (  SYMBICORT) 80-4.5 MCG/ACT inhaler Inhale 2 puffs into the lungs 2 (two) times daily. Patient taking differently: Inhale 2 puffs into the lungs 2 (two) times daily as needed (for wheezing/shortness of breath).  12/28/15  Yes Nishant Dhungel, MD  diazepam (VALIUM) 5 MG tablet Take 5 mg by mouth 4 (four) times daily as needed for anxiety.   Yes Historical Provider, MD  FLUoxetine (PROZAC) 20 MG capsule Take 20 mg by mouth daily.   Yes Historical Provider, MD  omeprazole (PRILOSEC) 40 MG capsule Take 40 mg by mouth daily.   Yes  Historical Provider, MD  oxyCODONE (ROXICODONE) 5 MG immediate release tablet Take 1 tablet (5 mg total) by mouth every 6 (six) hours as needed for severe pain. 05/10/16  Yes Ashok Pall, MD  promethazine (PHENERGAN) 25 MG tablet Take 25 mg by mouth every 6 (six) hours as needed for nausea or vomiting.   Yes Historical Provider, MD  SUBOXONE 8-2 MG FILM Place 1 Film under the tongue 2 (two) times daily.   Yes Historical Provider, MD  tiotropium (SPIRIVA HANDIHALER) 18 MCG inhalation capsule Place 1 capsule (18 mcg total) into inhaler and inhale every morning. Patient taking differently: Place 18 mcg into inhaler and inhale every evening.  12/28/15  Yes Nishant Dhungel, MD  tiZANidine (ZANAFLEX) 4 MG tablet Take 1 tablet (4 mg total) by mouth every 6 (six) hours as needed for muscle spasms. 05/10/16  Yes Ashok Pall, MD  valsartan-hydrochlorothiazide (DIOVAN-HCT) 160-12.5 MG tablet Take 1 tablet by mouth daily.   Yes Historical Provider, MD  chlordiazePOXIDE (LIBRIUM) 25 MG capsule 50mg  PO TID x 1D, then 25-50mg  PO BID X 1D, then 25-50mg  PO QD X 1D 06/17/16   Forde Dandy, MD  metoCLOPramide (REGLAN) 10 MG tablet Take 1 tablet (10 mg total) by mouth every 6 (six) hours. 06/17/16   Forde Dandy, MD  potassium chloride SA (K-DUR,KLOR-CON) 20 MEQ tablet Take 1 tablet (20 mEq total) by mouth 2 (two) times daily. 06/17/16   Forde Dandy, MD    Family History Family History  Problem Relation Age of Onset  . Stroke Father   . Hypertension Father   . Lung cancer Father     Social History Social History  Substance Use Topics  . Smoking status: Current Every Day Smoker    Packs/day: 1.00    Years: 42.00    Types: Cigarettes  . Smokeless tobacco: Never Used     Comment: prior trial of zyban made her feel weird, pt doesnt want info right now  . Alcohol use No     Allergies   Haldol [haloperidol lactate] and Lyrica [pregabalin]   Review of Systems Review of Systems  Constitutional: Positive for  chills and fever.  HENT: Negative for congestion.   Respiratory: Negative for cough.   Gastrointestinal: Positive for abdominal pain and vomiting. Negative for diarrhea.  Genitourinary: Negative for difficulty urinating and dysuria.  Allergic/Immunologic: Negative for immunocompromised state.  Neurological: Negative for syncope.  Hematological: Does not bruise/bleed easily.  All other systems reviewed and are negative.    Physical Exam Updated Vital Signs BP (!) 168/79 (BP Location: Left Arm)   Pulse 81   Temp 99.5 F (37.5 C) (Oral)   Resp 15   Ht 5\' 7"  (1.702 m)   Wt 194 lb (88 kg)   LMP 08/28/2012   SpO2 98%   BMI 30.38 kg/m   Physical Exam Physical Exam  Nursing note and vitals reviewed. Constitutional: Non-toxic, moaning  with tearless crying in ED gurney Head: Normocephalic and atraumatic.  Mouth/Throat: Oropharynx is clear and dry mucous membranes.  Neck: Normal range of motion. Neck supple.  Cardiovascular: Normal rate and regular rhythm.   Pulmonary/Chest: Effort normal and breath sounds normal.  Abdominal: Soft. Obese. There is mild generalized tenderness. There is no rebound and no guarding.  Musculoskeletal: Normal range of motion.  Neurological: Alert, no facial droop, fluent speech, moves all extremities symmetrically Skin: Skin is warm and dry.  Psychiatric: Cooperative   ED Treatments / Results  Labs (all labs ordered are listed, but only abnormal results are displayed) Labs Reviewed  CBC WITH DIFFERENTIAL/PLATELET - Abnormal; Notable for the following:       Result Value   WBC 11.7 (*)    HCT 35.4 (*)    Platelets 451 (*)    Neutro Abs 8.1 (*)    All other components within normal limits  COMPREHENSIVE METABOLIC PANEL - Abnormal; Notable for the following:    Potassium 2.7 (*)    CO2 21 (*)    Glucose, Bld 117 (*)    ALT 11 (*)    All other components within normal limits  URINALYSIS, ROUTINE W REFLEX MICROSCOPIC - Abnormal; Notable for the  following:    Color, Urine COLORLESS (*)    Specific Gravity, Urine 1.004 (*)    Hgb urine dipstick SMALL (*)    Squamous Epithelial / LPF 0-5 (*)    All other components within normal limits  MAGNESIUM - Abnormal; Notable for the following:    Magnesium 1.6 (*)    All other components within normal limits  LIPASE, BLOOD  I-STAT TROPOININ, ED    EKG  EKG Interpretation  Date/Time:  Friday June 17 2016 16:16:49 EDT Ventricular Rate:  73 PR Interval:    QRS Duration: 88 QT Interval:  431 QTC Calculation: 475 R Axis:   22 Text Interpretation:  Sinus rhythm Probable anterior infarct, age indeterminate No acute ischemic changes  Confirmed by Trayson Stitely MD, Trang Bouse [phone]) on 06/17/2016 4:20:36 PM       Radiology Dg Abdomen Acute W/chest  Result Date: 06/17/2016 CLINICAL DATA:  Nausea and vomiting. Generalized abdominal discomfort. EXAM: DG ABDOMEN ACUTE W/ 1V CHEST COMPARISON:  Abdominal CT 03/04/2016 FINDINGS: Normal heart size and mediastinal contours. No acute infiltrate or edema. No effusion or pneumothorax. No acute osseous findings. No evidence of bowel obstruction or perforation. No concerning mass effect or calcification. Multiple rounded densities in the pelvis are attributed to phleboliths. No rectal impaction or generalized stool retention. Previous spinal fusion with hardware resected since 03/04/2016 IMPRESSION: No acute finding in the chest or abdomen. Electronically Signed   By: Monte Fantasia M.D.   On: 06/17/2016 15:39    Procedures Procedures (including critical care time)  Medications Ordered in ED Medications  ondansetron (ZOFRAN) injection 4 mg (4 mg Intravenous Given 06/17/16 1510)  diazepam (VALIUM) injection 10 mg (10 mg Intravenous Given 06/17/16 1510)  sodium chloride 0.9 % bolus 1,000 mL (0 mLs Intravenous Stopped 06/17/16 1628)  potassium chloride 10 mEq in 100 mL IVPB (0 mEq Intravenous Stopped 06/17/16 2007)  promethazine (PHENERGAN) injection 25 mg (25 mg  Intravenous Given 06/17/16 1628)  gi cocktail (Maalox,Lidocaine,Donnatal) (30 mLs Oral Given 06/17/16 1812)  magnesium sulfate IVPB 2 g 50 mL (0 g Intravenous Stopped 06/17/16 1927)  LORazepam (ATIVAN) injection 2 mg (2 mg Intravenous Given 06/17/16 1843)     Initial Impression / Assessment and Plan / ED Course  I  have reviewed the triage vital signs and the nursing notes.  Pertinent labs & imaging results that were available during my care of the patient were reviewed by me and considered in my medical decision making (see chart for details).     Presenting with nausea and vomiting. Vitals stable. She has a benign and non-surgical abdomen. She does have hypomagnesemia and hypokalemia on blood work, and repleted IV. She has no further vomiting in ED, but continues to moan and ask for valium to help her relax and sleep. She requests valium prescription as she has recently ran out of this. Question if potentially she may be having some benzodiazepine withdrawal. She did receive valium and ativan in ED, and she feels improved. Tolerating fluids. Will discharge home with librium taper, close PCP follow-up and antiemetics.   Final Clinical Impressions(s) / ED Diagnoses   Final diagnoses:  Nausea    New Prescriptions New Prescriptions   CHLORDIAZEPOXIDE (LIBRIUM) 25 MG CAPSULE    50mg  PO TID x 1D, then 25-50mg  PO BID X 1D, then 25-50mg  PO QD X 1D   METOCLOPRAMIDE (REGLAN) 10 MG TABLET    Take 1 tablet (10 mg total) by mouth every 6 (six) hours.   POTASSIUM CHLORIDE SA (K-DUR,KLOR-CON) 20 MEQ TABLET    Take 1 tablet (20 mEq total) by mouth 2 (two) times daily.     Forde Dandy, MD 06/17/16 2014

## 2016-06-23 DIAGNOSIS — Z79891 Long term (current) use of opiate analgesic: Secondary | ICD-10-CM | POA: Diagnosis not present

## 2016-07-04 ENCOUNTER — Ambulatory Visit (INDEPENDENT_AMBULATORY_CARE_PROVIDER_SITE_OTHER): Payer: Medicare Other | Admitting: Orthopedic Surgery

## 2016-07-06 ENCOUNTER — Ambulatory Visit (INDEPENDENT_AMBULATORY_CARE_PROVIDER_SITE_OTHER): Payer: Medicare Other | Admitting: Orthopedic Surgery

## 2016-07-07 ENCOUNTER — Ambulatory Visit (INDEPENDENT_AMBULATORY_CARE_PROVIDER_SITE_OTHER): Payer: Medicare Other | Admitting: Family

## 2016-07-07 DIAGNOSIS — Z79891 Long term (current) use of opiate analgesic: Secondary | ICD-10-CM | POA: Diagnosis not present

## 2016-07-11 IMAGING — DX DG CHEST 2V
2 series · 2 of 2 positions shown · non-contrast
Comparison: Chest radiograph performed 04/30/2015

CLINICAL DATA: Acute onset of shortness of breath and cough.
Fatigue. Initial encounter.

EXAM:
CHEST  2 VIEW

[chest pa]
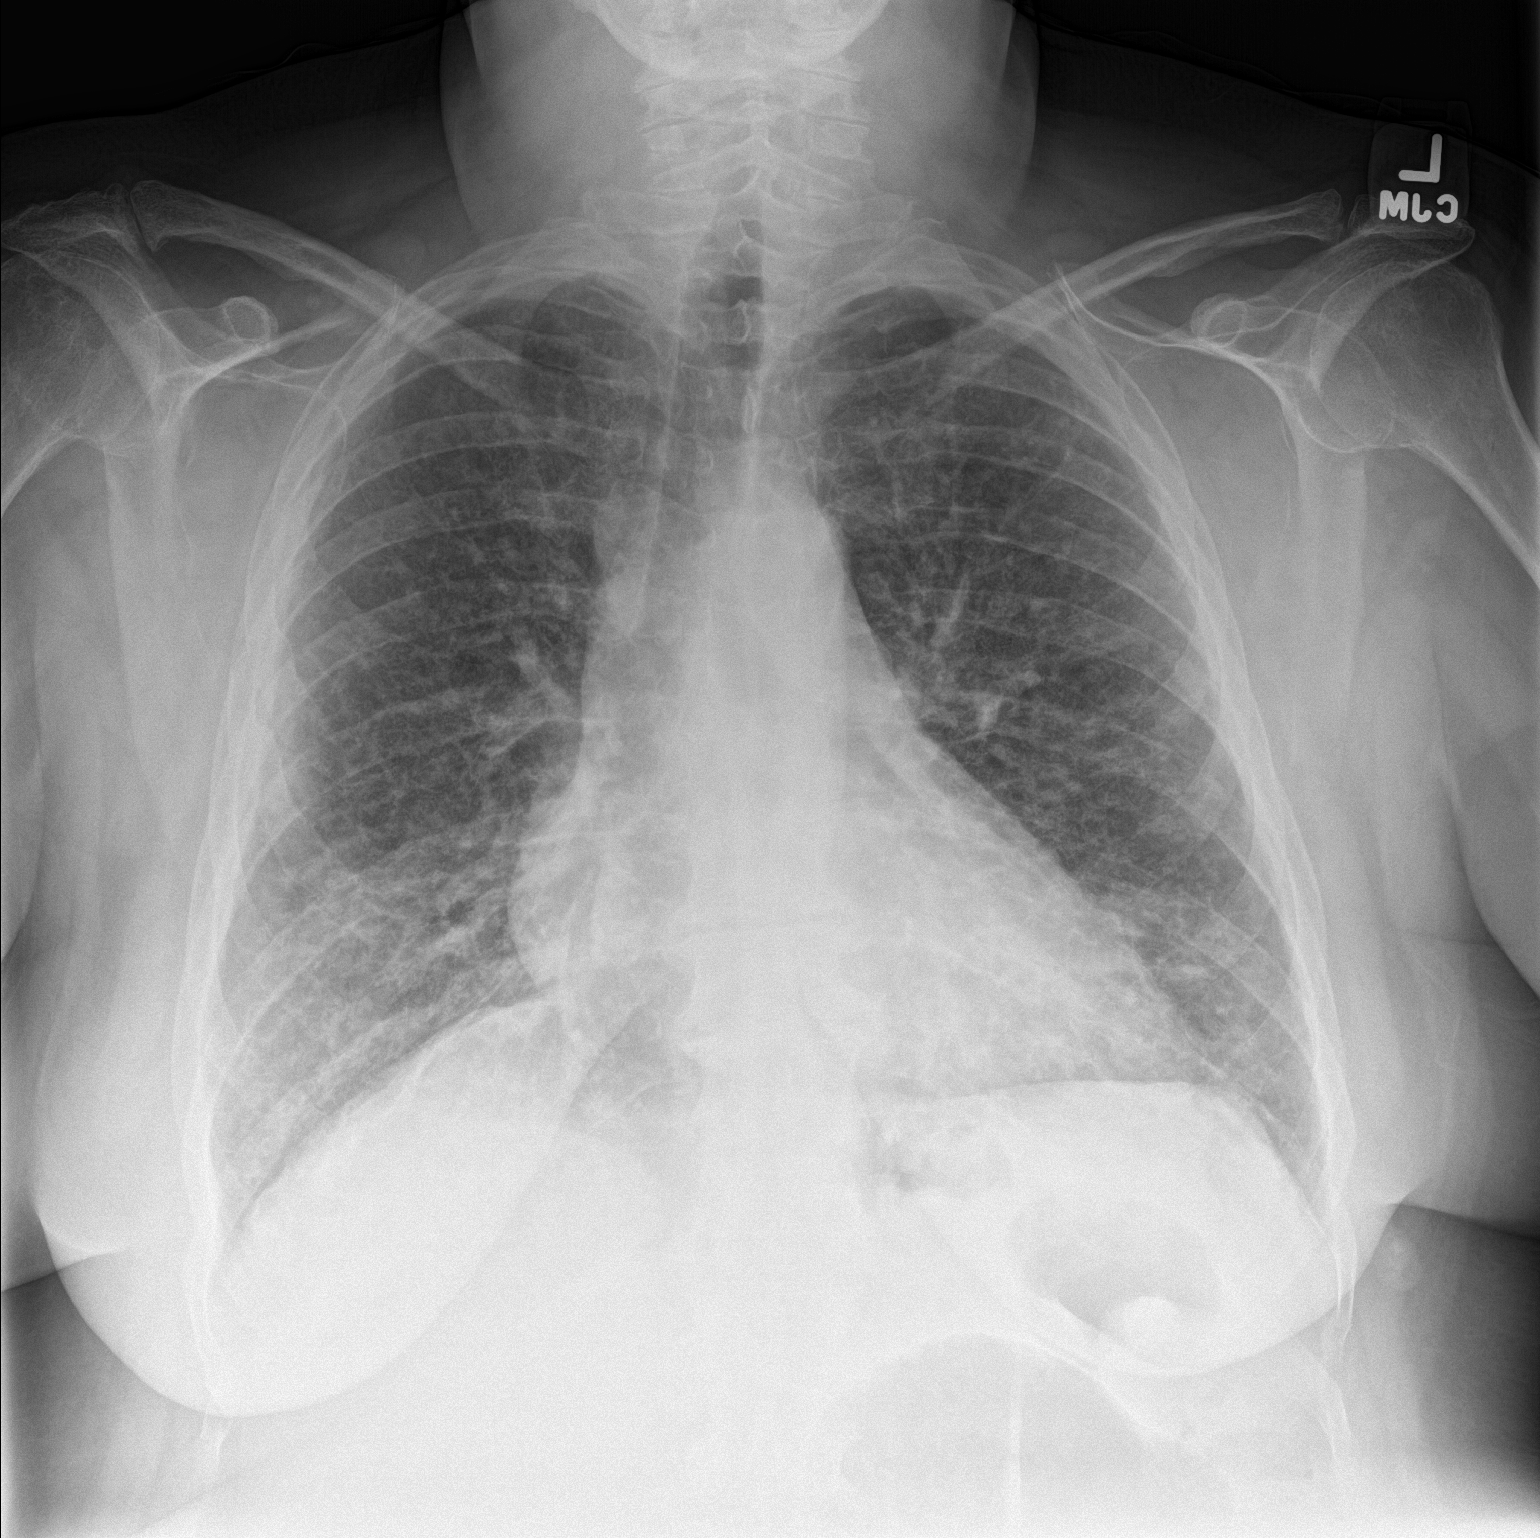

[chest lat]
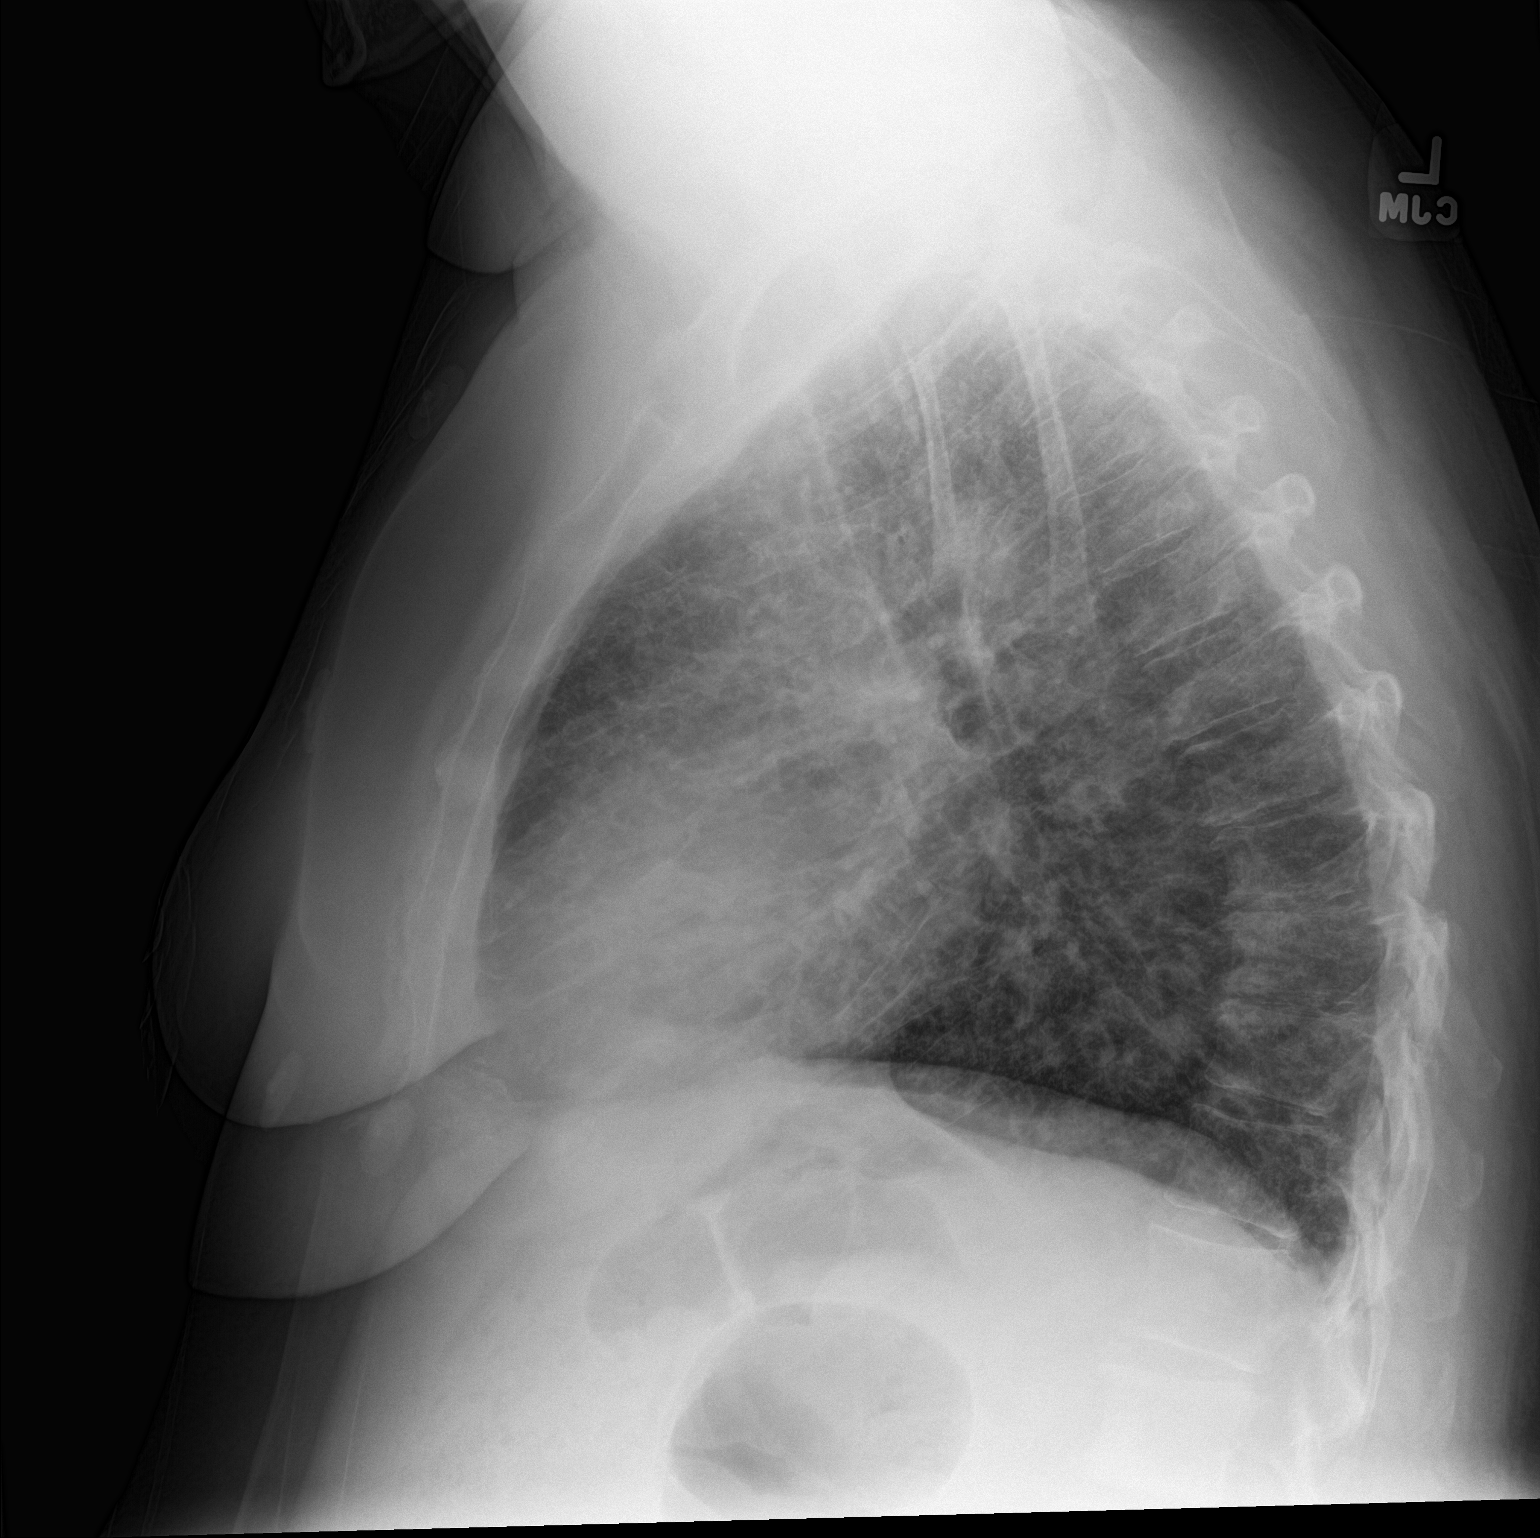

[2 of 2 positions shown; findings below may reference images not displayed]

FINDINGS: The lungs are well-aerated. Vascular congestion is noted. Mild
bibasilar opacities may reflect mild interstitial edema. There is no
evidence of pleural effusion or pneumothorax.

The heart is borderline enlarged. No acute osseous abnormalities are
seen.
IMPRESSION: Vascular congestion and borderline cardiomegaly. Bibasilar airspace
opacities may reflect mild interstitial edema.

## 2016-07-19 DIAGNOSIS — M4316 Spondylolisthesis, lumbar region: Secondary | ICD-10-CM | POA: Diagnosis not present

## 2016-07-19 DIAGNOSIS — H02831 Dermatochalasis of right upper eyelid: Secondary | ICD-10-CM | POA: Diagnosis not present

## 2016-07-19 DIAGNOSIS — H02834 Dermatochalasis of left upper eyelid: Secondary | ICD-10-CM | POA: Diagnosis not present

## 2016-07-19 DIAGNOSIS — H2513 Age-related nuclear cataract, bilateral: Secondary | ICD-10-CM | POA: Diagnosis not present

## 2016-07-21 DIAGNOSIS — Z79891 Long term (current) use of opiate analgesic: Secondary | ICD-10-CM | POA: Diagnosis not present

## 2016-08-02 DIAGNOSIS — Z79891 Long term (current) use of opiate analgesic: Secondary | ICD-10-CM | POA: Diagnosis not present

## 2016-08-05 DIAGNOSIS — Z1231 Encounter for screening mammogram for malignant neoplasm of breast: Secondary | ICD-10-CM | POA: Diagnosis not present

## 2016-08-17 DIAGNOSIS — Z79891 Long term (current) use of opiate analgesic: Secondary | ICD-10-CM | POA: Diagnosis not present

## 2016-08-22 ENCOUNTER — Encounter (INDEPENDENT_AMBULATORY_CARE_PROVIDER_SITE_OTHER): Payer: Self-pay | Admitting: Orthopedic Surgery

## 2016-08-22 ENCOUNTER — Ambulatory Visit (INDEPENDENT_AMBULATORY_CARE_PROVIDER_SITE_OTHER): Payer: Medicare Other | Admitting: Orthopedic Surgery

## 2016-08-22 VITALS — Ht 67.0 in | Wt 194.0 lb

## 2016-08-22 DIAGNOSIS — M7541 Impingement syndrome of right shoulder: Secondary | ICD-10-CM | POA: Diagnosis not present

## 2016-08-22 DIAGNOSIS — M7542 Impingement syndrome of left shoulder: Secondary | ICD-10-CM | POA: Diagnosis not present

## 2016-08-22 MED ORDER — LIDOCAINE HCL 1 % IJ SOLN
5.0000 mL | INTRAMUSCULAR | Status: AC | PRN
  Administered 2016-08-22: 5 mL

## 2016-08-22 MED ORDER — METHYLPREDNISOLONE ACETATE 40 MG/ML IJ SUSP
40.0000 mg | INTRAMUSCULAR | Status: AC | PRN
  Administered 2016-08-22: 40 mg via INTRA_ARTICULAR

## 2016-08-22 MED ORDER — DIAZEPAM 5 MG PO TABS: 5.0000 mg | ORAL_TABLET | Freq: Four times a day (QID) | ORAL | 0 refills | Status: DC | PRN

## 2016-08-22 NOTE — Progress Notes (Signed)
Office Visit Note   Patient: Madeline Dickerson           Date of Birth: 03-05-1960           MRN: [id number] Visit Date: 08/22/2016              Requested by: Lucianne Lei, MD [address], [place] [address] PCP: Lucianne Lei, MD  Chief Complaint  Patient presents with  . Left Shoulder - Follow-up  . Right Shoulder - Follow-up      HPI: Patient presents for follow-up for impingement syndrome bilateral shoulders. Patient states that she is also status post revision fusion for her cervical spine. Patient states the Valium has been helping with her musculoskeletal problems. She complains of popping with impingement of both shoulders.  Assessment & Plan: Visit Diagnoses:  1. Impingement syndrome of left shoulder   2. Impingement syndrome of right shoulder     Plan: Both shoulders are injected into the subacromial space. Patient is given a refill prescription for Valium for her musculoskeletal issues.  Follow-Up Instructions: Return if symptoms worsen or fail to improve.   Ortho Exam  Patient is alert, oriented, no adenopathy, well-dressed, normal affect, normal respiratory effort. Examination patient is having difficulty with short-term memory. She has good abduction and flexion of both shoulders. She has pain with Neer and Hawkins impingement test pain with drop arm test. There is mild crepitation with range of motion of the shoulders.  Imaging: No results found.  Labs: Lab Results  Component Value Date   HGBA1C 6.0 (H) 12/26/2015   HGBA1C 6.0 04/01/2011   REPTSTATUS 12/30/2015 FINAL 12/27/2015   GRAMSTAIN  12/27/2015    MODERATE WBC PRESENT,BOTH PMN AND MONONUCLEAR RARE SQUAMOUS EPITHELIAL CELLS PRESENT FEW GRAM POSITIVE COCCI IN PAIRS RARE BUDDING YEAST SEEN    CULT  12/27/2015    Consistent with normal respiratory flora. Performed at [facility] 04/30/2015    Orders:  No orders of the defined types were placed in  this encounter.  Meds ordered this encounter  Medications  . diazepam (VALIUM) 5 MG tablet    Sig: Take 1 tablet (5 mg total) by mouth 4 (four) times daily as needed for anxiety.    Dispense:  30 tablet    Refill:  0     Procedures: Large Joint Inj Date/Time: 08/22/2016 1:45 PM Performed by: DUDA, MARCUS V Authorized by: Newt Minion   Consent Given by:  Patient Site marked: the procedure site was marked   Timeout: prior to procedure the correct patient, procedure, and site was verified   Indications:  Pain and diagnostic evaluation Location:  Shoulder Site:  R subacromial bursa Prep: patient was prepped and draped in usual sterile fashion   Needle Size:  22 G Needle Length:  1.5 inches Approach:  Posterior Ultrasound Guidance: No   Fluoroscopic Guidance: No   Arthrogram: No   Medications:  5 mL lidocaine 1 %; 40 mg methylPREDNISolone acetate 40 MG/ML Aspiration Attempted: No   Patient tolerance:  Patient tolerated the procedure well with no immediate complications Large Joint Inj Date/Time: 08/22/2016 1:45 PM Performed by: DUDA, MARCUS V Authorized by: Meridee Score V   Consent Given by:  Patient Site marked: the procedure site was marked   Timeout: prior to procedure the correct patient, procedure, and site was verified   Indications:  Pain and diagnostic evaluation Location:  Shoulder Site:  L subacromial bursa Prep:  patient was prepped and draped in usual sterile fashion   Needle Size:  22 G Needle Length:  1.5 inches Approach:  Posterior Ultrasound Guidance: No   Fluoroscopic Guidance: No   Arthrogram: No   Medications:  5 mL lidocaine 1 %; 40 mg methylPREDNISolone acetate 40 MG/ML Aspiration Attempted: No   Patient tolerance:  Patient tolerated the procedure well with no immediate complications    Clinical Data: No additional findings.  ROS:  All other systems negative, except as noted in the HPI. Review of Systems  Objective: Vital Signs: Ht 5\' 7"   (1.702 m)   Wt 194 lb (88 kg)   LMP 08/28/2012   BMI 30.38 kg/m   Specialty Comments:  No specialty comments available.  PMFS History: Patient Active Problem List   Diagnosis Date Noted  . HNP (herniated nucleus pulposus), lumbar 05/05/2016  . Hyperkalemia 12/28/2015  . COPD exacerbation ([place]) 12/25/2015  . Anemia 12/25/2015  . Shortness of breath 12/25/2015  . Cardiomegaly 12/25/2015  . Bipolar 1 disorder, mixed, moderate ([place]) 06/03/2015  . CAP (community acquired pneumonia) 04/30/2015  . Pneumonitis 04/30/2015  . Status asthmaticus 04/30/2015  . Acute respiratory failure with hypoxia ([place]) 04/30/2015  . Dyspnea 04/30/2015  . Hyponatremia, severe 04/30/2015  . Lumbar spondylosis 04/27/2011  . Screening for colon cancer 01/17/2011  . Abnormal EKG 01/17/2011  . Tobacco abuse 01/17/2011  . Tobacco abuse counseling 01/17/2011  . Hypertension   . Chronic low back pain    Past Medical History:  Diagnosis Date  . Anxiety    hx  . Arthritis    "lower back, hands" (05/01/2015)  . Chronic low back pain    disk s/p 4 diskectomies, 5th fursion, then spinal cord stimulator ([place])  . Depression   . Dysrhythmia    irruglar heart beat "nothing wrong"   . Fibromyalgia   . GERD (gastroesophageal reflux disease)   . Hypertension   . Pneumonia 05/01/2015  . Rotator cuff disorder   . Seasonal asthma    "seasonal"  . Shortness of breath dyspnea   . Walking pneumonia     Family History  Problem Relation Age of Onset  . Stroke Father   . Hypertension Father   . Lung cancer Father     Past Surgical History:  Procedure Laterality Date  . BACK SURGERY    . CESAREAN SECTION  1986  . LUMBAR MICRODISCECTOMY  01/1990; 2003  . LUMBAR SPINE SURGERY  03/1991   "cleaned up scar tissue"  . POSTERIOR LUMBAR FUSION  2011; 04/27/2011  . REPAIR DURAL / CSF LEAK  02/1990  . SPINAL CORD STIMULATOR IMPLANT  2012  . SPINAL CORD STIMULATOR REMOVAL  08/2010  . TUBAL LIGATION  1986   Social  History   Occupational History  . Not on file.   Social History Main Topics  . Smoking status: Current Every Day Smoker    Packs/day: 1.00    Years: 42.00    Types: Cigarettes  . Smokeless tobacco: Never Used     Comment: prior trial of zyban made her feel weird, pt doesnt want info right now  . Alcohol use No  . Drug use: Yes    Types: Marijuana     Comment: 05/01/2015 "I've used lots of multiple street drugs; nothing in the past couple years", "i use weed once a month"  . Sexual activity: Yes

## 2016-08-23 ENCOUNTER — Other Ambulatory Visit (INDEPENDENT_AMBULATORY_CARE_PROVIDER_SITE_OTHER): Payer: Self-pay | Admitting: Family

## 2016-09-01 DIAGNOSIS — Z79891 Long term (current) use of opiate analgesic: Secondary | ICD-10-CM | POA: Diagnosis not present

## 2016-09-13 ENCOUNTER — Emergency Department (HOSPITAL_COMMUNITY)
Admission: EM | Admit: 2016-09-13 | Discharge: 2016-09-13 | Disposition: A | Discharge status: Home / Self Care | Payer: Medicare Other | Attending: Emergency Medicine | Admitting: Emergency Medicine

## 2016-09-13 ENCOUNTER — Encounter (HOSPITAL_COMMUNITY): Payer: Self-pay | Admitting: Emergency Medicine

## 2016-09-13 DIAGNOSIS — R1084 Generalized abdominal pain: Secondary | ICD-10-CM | POA: Insufficient documentation

## 2016-09-13 DIAGNOSIS — K2970 Gastritis, unspecified, without bleeding: Secondary | ICD-10-CM | POA: Diagnosis not present

## 2016-09-13 DIAGNOSIS — R112 Nausea with vomiting, unspecified: Secondary | ICD-10-CM | POA: Diagnosis not present

## 2016-09-13 DIAGNOSIS — Z5321 Procedure and treatment not carried out due to patient leaving prior to being seen by health care provider: Secondary | ICD-10-CM | POA: Insufficient documentation

## 2016-09-13 LAB — COMPREHENSIVE METABOLIC PANEL
ALT: 16 U/L (ref 14–54)
AST: 21 U/L (ref 15–41)
Albumin: 4.5 g/dL (ref 3.5–5.0)
Alkaline Phosphatase: 100 U/L (ref 38–126)
Anion gap: 12 (ref 5–15)
BUN: 19 mg/dL (ref 6–20)
CO2: 21 mmol/L — ABNORMAL LOW (ref 22–32)
Calcium: 9.8 mg/dL (ref 8.9–10.3)
Chloride: 100 mmol/L — ABNORMAL LOW (ref 101–111)
Creatinine, Ser: 0.69 mg/dL (ref 0.44–1.00)
GFR calc Af Amer: >60 mL/min (ref >60)
GFR calc non Af Amer: >60 mL/min (ref >60)
Glucose, Bld: 115 mg/dL — ABNORMAL HIGH (ref 65–99)
Potassium: 3.7 mmol/L (ref 3.5–5.1)
Sodium: 133 mmol/L — ABNORMAL LOW (ref 135–145)
Total Bilirubin: 0.6 mg/dL (ref 0.3–1.2)
Total Protein: 8.1 g/dL (ref 6.5–8.1)

## 2016-09-13 LAB — CBC
HCT: 39.8 % (ref 36.0–46.0)
Hemoglobin: 13.8 g/dL (ref 12.0–15.0)
MCH: 29.6 pg (ref 26.0–34.0)
MCHC: 34.7 g/dL (ref 30.0–36.0)
MCV: 85.2 fL (ref 78.0–100.0)
Platelets: 394 10*3/uL (ref 150–400)
RBC: 4.67 MIL/uL (ref 3.87–5.11)
RDW: 14.5 % (ref 11.5–15.5)
WBC: 13.3 10*3/uL — ABNORMAL HIGH (ref 4.0–10.5)

## 2016-09-13 LAB — LIPASE, BLOOD: Lipase: 13 U/L (ref 11–51)

## 2016-09-13 NOTE — ED Notes (Signed)
Called Pt to be roomed no response in lobby.

## 2016-09-13 NOTE — ED Triage Notes (Signed)
Per EMS-states mid, generalized abdominal pain that started this am-not sure if it is food poisoning-patient is weaning her self off of Suboxon-has not taken today

## 2016-09-13 NOTE — ED Notes (Signed)
Pt does not respond when called from lobby.

## 2016-09-13 NOTE — ED Notes (Signed)
Pt does not respond when come from lobby.

## 2016-09-15 DIAGNOSIS — Z79891 Long term (current) use of opiate analgesic: Secondary | ICD-10-CM | POA: Diagnosis not present

## 2016-09-19 ENCOUNTER — Emergency Department (HOSPITAL_COMMUNITY)
Admission: EM | Admit: 2016-09-19 | Discharge: 2016-09-19 | Disposition: A | Discharge status: Home / Self Care | Payer: Medicare Other | Attending: Emergency Medicine | Admitting: Emergency Medicine

## 2016-09-19 ENCOUNTER — Emergency Department (HOSPITAL_COMMUNITY): Payer: Medicare Other

## 2016-09-19 ENCOUNTER — Encounter (HOSPITAL_COMMUNITY): Payer: Self-pay

## 2016-09-19 DIAGNOSIS — K449 Diaphragmatic hernia without obstruction or gangrene: Secondary | ICD-10-CM | POA: Diagnosis not present

## 2016-09-19 DIAGNOSIS — F17210 Nicotine dependence, cigarettes, uncomplicated: Secondary | ICD-10-CM | POA: Diagnosis not present

## 2016-09-19 DIAGNOSIS — F419 Anxiety disorder, unspecified: Secondary | ICD-10-CM | POA: Diagnosis not present

## 2016-09-19 DIAGNOSIS — I10 Essential (primary) hypertension: Secondary | ICD-10-CM | POA: Diagnosis not present

## 2016-09-19 DIAGNOSIS — R112 Nausea with vomiting, unspecified: Secondary | ICD-10-CM | POA: Diagnosis not present

## 2016-09-19 DIAGNOSIS — J45909 Unspecified asthma, uncomplicated: Secondary | ICD-10-CM | POA: Diagnosis not present

## 2016-09-19 DIAGNOSIS — Z79899 Other long term (current) drug therapy: Secondary | ICD-10-CM | POA: Insufficient documentation

## 2016-09-19 DIAGNOSIS — J449 Chronic obstructive pulmonary disease, unspecified: Secondary | ICD-10-CM | POA: Diagnosis not present

## 2016-09-19 DIAGNOSIS — E871 Hypo-osmolality and hyponatremia: Secondary | ICD-10-CM | POA: Insufficient documentation

## 2016-09-19 DIAGNOSIS — R079 Chest pain, unspecified: Secondary | ICD-10-CM | POA: Diagnosis not present

## 2016-09-19 DIAGNOSIS — R109 Unspecified abdominal pain: Secondary | ICD-10-CM | POA: Diagnosis present

## 2016-09-19 DIAGNOSIS — R1084 Generalized abdominal pain: Secondary | ICD-10-CM | POA: Diagnosis not present

## 2016-09-19 LAB — COMPREHENSIVE METABOLIC PANEL
ALT: 15 U/L (ref 14–54)
AST: 24 U/L (ref 15–41)
Albumin: 4.4 g/dL (ref 3.5–5.0)
Alkaline Phosphatase: 96 U/L (ref 38–126)
Anion gap: 12 (ref 5–15)
BUN: 17 mg/dL (ref 6–20)
CO2: 19 mmol/L — ABNORMAL LOW (ref 22–32)
Calcium: 9.8 mg/dL (ref 8.9–10.3)
Chloride: 95 mmol/L — ABNORMAL LOW (ref 101–111)
Creatinine, Ser: 1.07 mg/dL — ABNORMAL HIGH (ref 0.44–1.00)
GFR calc Af Amer: >60 mL/min (ref >60)
GFR calc non Af Amer: 57 mL/min — ABNORMAL LOW (ref >60)
Glucose, Bld: 136 mg/dL — ABNORMAL HIGH (ref 65–99)
Potassium: 3.7 mmol/L (ref 3.5–5.1)
Sodium: 126 mmol/L — ABNORMAL LOW (ref 135–145)
Total Bilirubin: 0.9 mg/dL (ref 0.3–1.2)
Total Protein: 7.7 g/dL (ref 6.5–8.1)

## 2016-09-19 LAB — URINALYSIS, ROUTINE W REFLEX MICROSCOPIC
Bilirubin Urine: NEGATIVE
Glucose, UA: NEGATIVE mg/dL
Ketones, ur: NEGATIVE mg/dL
Leukocytes, UA: NEGATIVE
Nitrite: NEGATIVE
Protein, ur: NEGATIVE mg/dL
Specific Gravity, Urine: 1.013 (ref 1.005–1.030)
pH: 5 (ref 5.0–8.0)

## 2016-09-19 LAB — CBC
HCT: 38.4 % (ref 36.0–46.0)
Hemoglobin: 13.6 g/dL (ref 12.0–15.0)
MCH: 29.4 pg (ref 26.0–34.0)
MCHC: 35.4 g/dL (ref 30.0–36.0)
MCV: 82.9 fL (ref 78.0–100.0)
Platelets: 392 10*3/uL (ref 150–400)
RBC: 4.63 MIL/uL (ref 3.87–5.11)
RDW: 14.6 % (ref 11.5–15.5)
WBC: 15.4 10*3/uL — ABNORMAL HIGH (ref 4.0–10.5)

## 2016-09-19 LAB — I-STAT CG4 LACTIC ACID, ED: Lactic Acid, Venous: 2.35 mmol/L (ref 0.5–1.9)

## 2016-09-19 LAB — MAGNESIUM: Magnesium: 1.4 mg/dL — ABNORMAL LOW (ref 1.7–2.4)

## 2016-09-19 LAB — LIPASE, BLOOD: Lipase: 20 U/L (ref 11–51)

## 2016-09-19 MED ORDER — LORAZEPAM 2 MG/ML IJ SOLN
2.0000 mg | Freq: Once | INTRAMUSCULAR | Status: AC
  Administered 2016-09-19: 2 mg via INTRAVENOUS
  Filled 2016-09-19: qty 1

## 2016-09-19 MED ORDER — ONDANSETRON 4 MG PO TBDP: 4.0000 mg | ORAL_TABLET | Freq: Three times a day (TID) | ORAL | 0 refills | Status: DC | PRN

## 2016-09-19 MED ORDER — GI COCKTAIL ~~LOC~~
30.0000 mL | Freq: Once | ORAL | Status: AC
  Administered 2016-09-19: 30 mL via ORAL
  Filled 2016-09-19: qty 30

## 2016-09-19 MED ORDER — ONDANSETRON HCL 4 MG/2ML IJ SOLN
4.0000 mg | Freq: Once | INTRAMUSCULAR | Status: AC
  Administered 2016-09-19: 4 mg via INTRAVENOUS
  Filled 2016-09-19: qty 2

## 2016-09-19 MED ORDER — MORPHINE SULFATE (PF) 4 MG/ML IV SOLN
4.0000 mg | Freq: Once | INTRAVENOUS | Status: AC
  Administered 2016-09-19: 4 mg via INTRAVENOUS
  Filled 2016-09-19: qty 1

## 2016-09-19 MED ORDER — DIAZEPAM 5 MG PO TABS: 5.0000 mg | ORAL_TABLET | Freq: Two times a day (BID) | ORAL | 0 refills | Status: DC

## 2016-09-19 MED ORDER — DIPHENHYDRAMINE HCL 50 MG/ML IJ SOLN
25.0000 mg | Freq: Once | INTRAMUSCULAR | Status: AC
  Administered 2016-09-19: 25 mg via INTRAVENOUS
  Filled 2016-09-19: qty 1

## 2016-09-19 MED ORDER — MAGNESIUM SULFATE 2 GM/50ML IV SOLN
2.0000 g | Freq: Once | INTRAVENOUS | Status: AC
Start: 2016-09-19 — End: 2016-09-19
  Administered 2016-09-19: 2 g via INTRAVENOUS
  Filled 2016-09-19: qty 50

## 2016-09-19 MED ORDER — METOCLOPRAMIDE HCL 5 MG/ML IJ SOLN
10.0000 mg | Freq: Once | INTRAMUSCULAR | Status: AC
  Administered 2016-09-19: 10 mg via INTRAVENOUS
  Filled 2016-09-19: qty 2

## 2016-09-19 MED ORDER — IOPAMIDOL (ISOVUE-300) INJECTION 61%
INTRAVENOUS | Status: AC
  Administered 2016-09-19: 100 mL
  Filled 2016-09-19: qty 100

## 2016-09-19 MED ORDER — SODIUM CHLORIDE 0.9 % IV BOLUS (SEPSIS)
1000.0000 mL | Freq: Once | INTRAVENOUS | Status: AC
Start: 2016-09-19 — End: 2016-09-19
  Administered 2016-09-19: 1000 mL via INTRAVENOUS

## 2016-09-19 MED ORDER — LORAZEPAM 2 MG/ML IJ SOLN
1.0000 mg | Freq: Once | INTRAMUSCULAR | Status: AC
  Administered 2016-09-19: 1 mg via INTRAVENOUS
  Filled 2016-09-19: qty 1

## 2016-09-19 NOTE — ED Triage Notes (Signed)
Pt complains of abd pain x 6 days with radiation up into the chest with n/v/d that began 3 days ag worse in the morning, pt has GERD but this feels different. 12 lead showed NSR. Pt given zofran ODT. Clammy but afebrile. VS 153/116, HR 80, RR 20, spo2 96% on RA but pt wheezing and given 5mg  of albuterol.

## 2016-09-19 NOTE — ED Notes (Signed)
Pt called out and asking for more pain medicine. Pt states that she was almost asleep and radiology woke her up and now she's in pain again.

## 2016-09-19 NOTE — ED Provider Notes (Signed)
[facility] DEPT Provider Note   CSN: [id number] Arrival date & time: 09/19/16  1151     History   Chief Complaint Chief Complaint  Patient presents with  . Abdominal Pain    HPI Madeline Dickerson is a 56 y.o. female.  Pt presents to the ED today with severe upper abdominal pain.  The pt said that she has had pain for 6 days, but it is getting worse.  Pt has a hx of GERD, but this is worse than any pain she has ever had.  The pt has been vomiting for 3 days.  The pt is requesting valium and feels that may make her feel better.       Past Medical History:  Diagnosis Date  . Anxiety    hx  . Arthritis    "lower back, hands" (05/01/2015)  . Chronic low back pain    disk s/p 4 diskectomies, 5th fursion, then spinal cord stimulator ([place])  . Depression   . Dysrhythmia    irruglar heart beat "nothing wrong"   . Fibromyalgia   . GERD (gastroesophageal reflux disease)   . Hypertension   . Pneumonia 05/01/2015  . Rotator cuff disorder   . Seasonal asthma    "seasonal"  . Shortness of breath dyspnea   . Walking pneumonia     Patient Active Problem List   Diagnosis Date Noted  . HNP (herniated nucleus pulposus), lumbar 05/05/2016  . Hyperkalemia 12/28/2015  . COPD exacerbation ([place]) 12/25/2015  . Anemia 12/25/2015  . Shortness of breath 12/25/2015  . Cardiomegaly 12/25/2015  . Bipolar 1 disorder, mixed, moderate ([place]) 06/03/2015  . CAP (community acquired pneumonia) 04/30/2015  . Pneumonitis 04/30/2015  . Status asthmaticus 04/30/2015  . Acute respiratory failure with hypoxia ([place]) 04/30/2015  . Dyspnea 04/30/2015  . Hyponatremia, severe 04/30/2015  . Lumbar spondylosis 04/27/2011  . Screening for colon cancer 01/17/2011  . Abnormal EKG 01/17/2011  . Tobacco abuse 01/17/2011  . Tobacco abuse counseling 01/17/2011  . Hypertension   . Chronic low back pain     Past Surgical History:  Procedure Laterality Date  . BACK SURGERY    . CESAREAN SECTION  1986  .  LUMBAR MICRODISCECTOMY  01/1990; 2003  . LUMBAR SPINE SURGERY  03/1991   "cleaned up scar tissue"  . POSTERIOR LUMBAR FUSION  2011; 04/27/2011  . REPAIR DURAL / CSF LEAK  02/1990  . SPINAL CORD STIMULATOR IMPLANT  2012  . SPINAL CORD STIMULATOR REMOVAL  08/2010  . TUBAL LIGATION  1986    OB History    No data available       Home Medications    Prior to Admission medications   Medication Sig Start Date End Date Taking? Authorizing Provider  albuterol (PROVENTIL HFA;VENTOLIN HFA) 108 (90 Base) MCG/ACT inhaler Inhale 2 puffs into the lungs every 6 (six) hours as needed for wheezing or shortness of breath. 12/28/15   Dhungel, Flonnie Overman, MD  budesonide-formoterol (SYMBICORT) 80-4.5 MCG/ACT inhaler Inhale 2 puffs into the lungs 2 (two) times daily. Patient taking differently: Inhale 2 puffs into the lungs 2 (two) times daily as needed (for wheezing/shortness of breath).  12/28/15   Dhungel, Flonnie Overman, MD  chlordiazePOXIDE (LIBRIUM) 25 MG capsule 50mg  PO TID x 1D, then 25-50mg  PO BID X 1D, then 25-50mg  PO QD X 1D 06/17/16   Forde Dandy, MD  diazepam (VALIUM) 5 MG tablet Take 1 tablet (5 mg total) by mouth 2 (two) times daily. 09/19/16   Isla Pence,  MD  FLUoxetine (PROZAC) 20 MG capsule Take 20 mg by mouth daily.    [provider]  metoCLOPramide (REGLAN) 10 MG tablet Take 1 tablet (10 mg total) by mouth every 6 (six) hours. 06/17/16   Forde Dandy, MD  omeprazole (PRILOSEC) 40 MG capsule Take 40 mg by mouth daily.    [provider]  ondansetron (ZOFRAN ODT) 4 MG disintegrating tablet Take 1 tablet (4 mg total) by mouth every 8 (eight) hours as needed. 09/19/16   Isla Pence, MD  oxyCODONE (ROXICODONE) 5 MG immediate release tablet Take 1 tablet (5 mg total) by mouth every 6 (six) hours as needed for severe pain. 05/10/16   Ashok Pall, MD  potassium chloride SA (K-DUR,KLOR-CON) 20 MEQ tablet Take 1 tablet (20 mEq total) by mouth 2 (two) times daily. 06/17/16   Forde Dandy,  MD  promethazine (PHENERGAN) 25 MG tablet Take 25 mg by mouth every 6 (six) hours as needed for nausea or vomiting.    [provider]  SUBOXONE 8-2 MG FILM Place 1 Film under the tongue 2 (two) times daily.    [provider]  tiotropium (SPIRIVA HANDIHALER) 18 MCG inhalation capsule Place 1 capsule (18 mcg total) into inhaler and inhale every morning. Patient taking differently: Place 18 mcg into inhaler and inhale every evening.  12/28/15   Dhungel, Nishant, MD  tiZANidine (ZANAFLEX) 4 MG tablet Take 1 tablet (4 mg total) by mouth every 6 (six) hours as needed for muscle spasms. 05/10/16   Ashok Pall, MD  valsartan-hydrochlorothiazide (DIOVAN-HCT) 160-12.5 MG tablet Take 1 tablet by mouth daily.    [provider]    Family History Family History  Problem Relation Age of Onset  . Stroke Father   . Hypertension Father   . Lung cancer Father     Social History Social History  Substance Use Topics  . Smoking status: Current Every Day Smoker    Packs/day: 1.00    Years: 42.00    Types: Cigarettes  . Smokeless tobacco: Never Used     Comment: prior trial of zyban made her feel weird, pt doesnt want info right now  . Alcohol use No     Allergies   Haldol [haloperidol lactate] and Lyrica [pregabalin]   Review of Systems Review of Systems  Gastrointestinal: Positive for abdominal pain, nausea and vomiting.  Psychiatric/Behavioral: The patient is nervous/anxious.   All other systems reviewed and are negative.    Physical Exam Updated Vital Signs BP (!) 179/109   Pulse 88   Temp 99.4 F (37.4 C) (Oral)   Resp 11   Ht 5\' 7"  (1.702 m)   Wt 86.2 kg (190 lb)   LMP 08/28/2012   SpO2 100%   BMI 29.76 kg/m   Physical Exam  Constitutional: She is oriented to person, place, and time. She appears well-developed. She appears distressed.  HENT:  Head: Normocephalic and atraumatic.  Right Ear: External ear normal.  Left Ear: External ear normal.    Nose: Nose normal.  Mouth/Throat: Oropharynx is clear and moist.  Eyes: Pupils are equal, round, and reactive to light. Conjunctivae and EOM are normal.  Neck: Normal range of motion. Neck supple.  Cardiovascular: Normal rate, regular rhythm, normal heart sounds and intact distal pulses.   Pulmonary/Chest: Effort normal and breath sounds normal.  Abdominal: Soft. Normal appearance. There is tenderness in the epigastric area.  Musculoskeletal: Normal range of motion.  Neurological: She is alert and oriented to person, place,  and time.  Skin: Skin is warm.  Psychiatric: Her mood appears anxious.  Nursing note and vitals reviewed.    ED Treatments / Results  Labs (all labs ordered are listed, but only abnormal results are displayed) Labs Reviewed  COMPREHENSIVE METABOLIC PANEL - Abnormal; Notable for the following:       Result Value   Sodium 126 (*)    Chloride 95 (*)    CO2 19 (*)    Glucose, Bld 136 (*)    Creatinine, Ser 1.07 (*)    GFR calc non Af Amer 57 (*)    All other components within normal limits  CBC - Abnormal; Notable for the following:    WBC 15.4 (*)    All other components within normal limits  URINALYSIS, ROUTINE W REFLEX MICROSCOPIC - Abnormal; Notable for the following:    Color, Urine STRAW (*)    Hgb urine dipstick SMALL (*)    Bacteria, UA RARE (*)    Squamous Epithelial / LPF 0-5 (*)    All other components within normal limits  MAGNESIUM - Abnormal; Notable for the following:    Magnesium 1.4 (*)    All other components within normal limits  I-STAT CG4 LACTIC ACID, ED - Abnormal; Notable for the following:    Lactic Acid, Venous 2.35 (*)    All other components within normal limits  LIPASE, BLOOD  I-STAT CG4 LACTIC ACID, ED    EKG  EKG Interpretation  Date/Time:  Monday September 19 2016 11:51:51 EDT Ventricular Rate:  91 PR Interval:    QRS Duration: 97 QT Interval:  379 QTC Calculation: 467 R Axis:   32 Text Interpretation:  Sinus rhythm  Consider left atrial enlargement Low voltage, precordial leads Anteroseptal infarct, age indeterminate Baseline wander in lead(s) III aVL Confirmed by Isla Pence [phone] on 09/19/2016 12:01:24 PM       Radiology Ct Abdomen Pelvis W Contrast  Result Date: 09/19/2016 CLINICAL DATA:  56 year old female with diffuse abdominal and pelvic pain for several days. EXAM: CT ABDOMEN AND PELVIS WITH CONTRAST TECHNIQUE: Multidetector CT imaging of the abdomen and pelvis was performed using the standard protocol following bolus administration of intravenous contrast. CONTRAST:  161mL ISOVUE-300 IOPAMIDOL (ISOVUE-300) INJECTION 61% COMPARISON:  03/04/2016 and prior CTs FINDINGS: Lower chest: No acute abnormality. Hepatobiliary: The liver and gallbladder are unremarkable. No biliary dilatation. Pancreas: Unremarkable Spleen: Unremarkable Adrenals/Urinary Tract: The kidneys, adrenal glands and bladder are unremarkable except for a stable left renal cyst. Stomach/Bowel: Small hiatal hernia is noted. There is no evidence of bowel obstruction, bowel wall thickening or pneumoperitoneum. Vascular/Lymphatic: No significant vascular findings are present. No enlarged abdominal or pelvic lymph nodes. Reproductive: Uterus and bilateral adnexa are unremarkable. Other: No ascites or focal collection. Musculoskeletal: Surgical changes within the lumbar spine again noted. No acute bony abnormality or suspicious focal lesion. IMPRESSION: No acute abnormality. Small hiatal hernia. Electronically Signed   By: Margarette Canada M.D.   On: 09/19/2016 13:32    Procedures Procedures (including critical care time)  Medications Ordered in ED Medications  gi cocktail (Maalox,Lidocaine,Donnatal) (not administered)  morphine 4 MG/ML injection 4 mg (4 mg Intravenous Given 09/19/16 1220)  ondansetron (ZOFRAN) injection 4 mg (4 mg Intravenous Given 09/19/16 1220)  LORazepam (ATIVAN) injection 1 mg (1 mg Intravenous Given 09/19/16 1220)  iopamidol  (ISOVUE-300) 61 % injection (100 mLs  Contrast Given 09/19/16 1309)  magnesium sulfate IVPB 2 g 50 mL (2 g Intravenous New Bag/Given 09/19/16 1349)  sodium chloride  0.9 % bolus 1,000 mL (1,000 mLs Intravenous New Bag/Given 09/19/16 1234)  LORazepam (ATIVAN) injection 2 mg (2 mg Intravenous Given 09/19/16 1402)  morphine 4 MG/ML injection 4 mg (4 mg Intravenous Given 09/19/16 1403)  metoCLOPramide (REGLAN) injection 10 mg (10 mg Intravenous Given 09/19/16 1358)  diphenhydrAMINE (BENADRYL) injection 25 mg (25 mg Intravenous Given 09/19/16 1356)     Initial Impression / Assessment and Plan / ED Course  I have reviewed the triage vital signs and the nursing notes.  Pertinent labs & imaging results that were available during my care of the patient were reviewed by me and considered in my medical decision making (see chart for details).    Pt feels much more comfortable.  She said that she is out of her bzd.  The pt's initial lactic acid is elevated (likely from n/v), but with 2L of fluids, I suspect it is going down.  She is tolerating po fluids.  Pt is stable for d/c.  She knows to return if worse.  Final Clinical Impressions(s) / ED Diagnoses   Final diagnoses:  Generalized abdominal pain  Non-intractable vomiting with nausea, unspecified vomiting type  Hypomagnesemia  Anxiety  Hyponatremia    New Prescriptions New Prescriptions   DIAZEPAM (VALIUM) 5 MG TABLET    Take 1 tablet (5 mg total) by mouth 2 (two) times daily.   ONDANSETRON (ZOFRAN ODT) 4 MG DISINTEGRATING TABLET    Take 1 tablet (4 mg total) by mouth every 8 (eight) hours as needed.     Isla Pence, MD 09/19/16 [phone]

## 2016-09-19 NOTE — Discharge Instructions (Signed)
Sodium and magnesium need to be rechecked by your pcp in 1 week.

## 2016-09-29 DIAGNOSIS — Z79891 Long term (current) use of opiate analgesic: Secondary | ICD-10-CM | POA: Diagnosis not present

## 2016-10-04 ENCOUNTER — Telehealth (INDEPENDENT_AMBULATORY_CARE_PROVIDER_SITE_OTHER): Payer: Self-pay | Admitting: Orthopedic Surgery

## 2016-10-04 NOTE — Telephone Encounter (Incomplete)
Pt was evaluated for bilateral shoulder impingement the first week of July. She received bilat shoulder injections and you refilled her Valium

## 2016-10-04 NOTE — Telephone Encounter (Signed)
Per Dr. Sharol Given pt should have valium filled by pcp. Pt asked if she could have a non narcotic rx for spasms. I advised that we are happy to see her in the office to evaluate what is going on. Pt states that she will call for an appt if she really needs one.

## 2016-10-04 NOTE — Telephone Encounter (Signed)
Patient called asking for a refill on Valium. Her daughter Larene Beach will be picking it up when its ready. CB # [phone]

## 2016-10-20 DIAGNOSIS — Z79891 Long term (current) use of opiate analgesic: Secondary | ICD-10-CM | POA: Diagnosis not present

## 2016-11-03 DIAGNOSIS — Z79891 Long term (current) use of opiate analgesic: Secondary | ICD-10-CM | POA: Diagnosis not present

## 2016-11-17 DIAGNOSIS — Z79891 Long term (current) use of opiate analgesic: Secondary | ICD-10-CM | POA: Diagnosis not present

## 2016-11-29 ENCOUNTER — Emergency Department (HOSPITAL_COMMUNITY)
Admission: EM | Admit: 2016-11-29 | Discharge: 2016-11-29 | Disposition: A | Discharge status: Home / Self Care | Payer: Medicare Other | Attending: Emergency Medicine | Admitting: Emergency Medicine

## 2016-11-29 ENCOUNTER — Encounter (HOSPITAL_COMMUNITY): Payer: Self-pay | Admitting: Emergency Medicine

## 2016-11-29 DIAGNOSIS — R112 Nausea with vomiting, unspecified: Secondary | ICD-10-CM | POA: Diagnosis not present

## 2016-11-29 DIAGNOSIS — R1013 Epigastric pain: Secondary | ICD-10-CM | POA: Diagnosis not present

## 2016-11-29 DIAGNOSIS — K2970 Gastritis, unspecified, without bleeding: Secondary | ICD-10-CM

## 2016-11-29 DIAGNOSIS — R110 Nausea: Secondary | ICD-10-CM | POA: Diagnosis not present

## 2016-11-29 LAB — URINALYSIS, ROUTINE W REFLEX MICROSCOPIC
Bilirubin Urine: NEGATIVE
Glucose, UA: NEGATIVE mg/dL
Ketones, ur: NEGATIVE mg/dL
Leukocytes, UA: NEGATIVE
Nitrite: NEGATIVE
Protein, ur: NEGATIVE mg/dL
Specific Gravity, Urine: 1.02 (ref 1.005–1.030)
pH: 6 (ref 5.0–8.0)

## 2016-11-29 LAB — DIFFERENTIAL
Basophils Absolute: 0 10*3/uL (ref 0.0–0.1)
Basophils Relative: 0 %
Eosinophils Absolute: 0.1 10*3/uL (ref 0.0–0.7)
Eosinophils Relative: 1 %
Lymphocytes Relative: 9 %
Lymphs Abs: 1.4 10*3/uL (ref 0.7–4.0)
Monocytes Absolute: 0.9 10*3/uL (ref 0.1–1.0)
Monocytes Relative: 6 %
Neutro Abs: 12.8 10*3/uL — ABNORMAL HIGH (ref 1.7–7.7)
Neutrophils Relative %: 84 %

## 2016-11-29 LAB — COMPREHENSIVE METABOLIC PANEL
ALT: 16 U/L (ref 14–54)
AST: 19 U/L (ref 15–41)
Albumin: 4.1 g/dL (ref 3.5–5.0)
Alkaline Phosphatase: 82 U/L (ref 38–126)
Anion gap: 12 (ref 5–15)
BUN: 16 mg/dL (ref 6–20)
CO2: 19 mmol/L — ABNORMAL LOW (ref 22–32)
Calcium: 9.1 mg/dL (ref 8.9–10.3)
Chloride: 106 mmol/L (ref 101–111)
Creatinine, Ser: 0.77 mg/dL (ref 0.44–1.00)
GFR calc Af Amer: >60 mL/min (ref >60)
GFR calc non Af Amer: >60 mL/min (ref >60)
Glucose, Bld: 120 mg/dL — ABNORMAL HIGH (ref 65–99)
Potassium: 3.1 mmol/L — ABNORMAL LOW (ref 3.5–5.1)
Sodium: 137 mmol/L (ref 135–145)
Total Bilirubin: 0.6 mg/dL (ref 0.3–1.2)
Total Protein: 7.6 g/dL (ref 6.5–8.1)

## 2016-11-29 LAB — URINALYSIS, MICROSCOPIC (REFLEX)

## 2016-11-29 LAB — CBC
HCT: 35.7 % — ABNORMAL LOW (ref 36.0–46.0)
Hemoglobin: 12.1 g/dL (ref 12.0–15.0)
MCH: 30.1 pg (ref 26.0–34.0)
MCHC: 33.9 g/dL (ref 30.0–36.0)
MCV: 88.8 fL (ref 78.0–100.0)
Platelets: 397 10*3/uL (ref 150–400)
RBC: 4.02 MIL/uL (ref 3.87–5.11)
RDW: 14.6 % (ref 11.5–15.5)
WBC: 15.3 10*3/uL — ABNORMAL HIGH (ref 4.0–10.5)

## 2016-11-29 LAB — LIPASE, BLOOD: Lipase: 18 U/L (ref 11–51)

## 2016-11-29 MED ORDER — SUCRALFATE 1 GM/10ML PO SUSP: 1.0000 g | Freq: Three times a day (TID) | ORAL | 0 refills | Status: DC

## 2016-11-29 MED ORDER — DIAZEPAM 5 MG/ML IJ SOLN
5.0000 mg | Freq: Once | INTRAMUSCULAR | Status: AC
  Administered 2016-11-29: 5 mg via INTRAVENOUS
  Filled 2016-11-29: qty 2

## 2016-11-29 MED ORDER — METOCLOPRAMIDE HCL 5 MG/ML IJ SOLN
10.0000 mg | Freq: Once | INTRAMUSCULAR | Status: AC
  Administered 2016-11-29: 10 mg via INTRAVENOUS
  Filled 2016-11-29: qty 2

## 2016-11-29 MED ORDER — METOCLOPRAMIDE HCL 10 MG PO TABS: 10.0000 mg | ORAL_TABLET | Freq: Four times a day (QID) | ORAL | 0 refills | Status: DC | PRN

## 2016-11-29 MED ORDER — ALUM & MAG HYDROXIDE-SIMETH 400-400-40 MG/5ML PO SUSP: 10.0000 mL | Freq: Four times a day (QID) | ORAL | 0 refills | Status: AC | PRN

## 2016-11-29 MED ORDER — POTASSIUM CHLORIDE CRYS ER 20 MEQ PO TBCR
40.0000 meq | EXTENDED_RELEASE_TABLET | Freq: Once | ORAL | Status: AC
  Administered 2016-11-29: 40 meq via ORAL
  Filled 2016-11-29: qty 2

## 2016-11-29 MED ORDER — SODIUM CHLORIDE 0.9 % IV BOLUS (SEPSIS)
1000.0000 mL | Freq: Once | INTRAVENOUS | Status: AC
  Administered 2016-11-29: 1000 mL via INTRAVENOUS

## 2016-11-29 MED ORDER — GI COCKTAIL ~~LOC~~
30.0000 mL | Freq: Once | ORAL | Status: AC
  Administered 2016-11-29: 30 mL via ORAL
  Filled 2016-11-29: qty 30

## 2016-11-29 MED ORDER — DIAZEPAM 5 MG PO TABS: 5.0000 mg | ORAL_TABLET | Freq: Two times a day (BID) | ORAL | 0 refills | Status: DC | PRN

## 2016-11-29 NOTE — Discharge Instructions (Signed)
STOP taking naprosyn or ibuprofen as that can make your abdominal pain worse.  Please follow-up with GI for further work-up. Your symptoms may be related to ulcer or inflammation of the stomach  Return for worsening symptoms, including fever, intractable vomiting, bloody or black stools or any other symptoms concerning ot you.

## 2016-11-29 NOTE — ED Provider Notes (Signed)
[facility] DEPT Provider Note   CSN: [id number] Arrival date & time: 11/29/16  0757     History   Chief Complaint Chief Complaint  Patient presents with  . Abdominal Pain    HPI Madeline Dickerson is a 56 y.o. female.  The history is provided by the patient.  Abdominal Pain   This is a chronic problem. The current episode started more than 1 week ago. The problem occurs constantly. The problem has not changed since onset.The pain is associated with an unknown factor. The pain is located in the epigastric region. The quality of the pain is aching and sharp. The pain is severe. Associated symptoms include nausea and vomiting. Pertinent negatives include fever, diarrhea, hematochezia, melena, constipation, dysuria and frequency. Nothing aggravates the symptoms. Nothing relieves the symptoms.   56 year old female who presents nausea, vomiting, and epigastric abdominal pain. She has history of fibromyalgia, GERD, HTN, and h/o cesarean section with tubal ligation. She reports a history of chronic epigastric abdominal pain with nausea and vomiting that has been ongoing for the past year. Reports that she has been seen in the emergency department 2 times for this in the past few months. States that her symptoms never fully goes away, but does vary in severity and sometimes improves. Unsure of how long her symptoms today have been worsening but states that she woke up this morning with nausea and vomiting. A normal bowel movement this morning with no melena or hematochezia. No recent fevers, urinary complaints, chest pain or difficulty breathing.   Past Medical History:  Diagnosis Date  . Anxiety    hx  . Arthritis    "lower back, hands" (05/01/2015)  . Chronic low back pain    disk s/p 4 diskectomies, 5th fursion, then spinal cord stimulator ([facility])  . Depression   . Dysrhythmia    irruglar heart beat "nothing wrong"   . Fibromyalgia   . GERD (gastroesophageal reflux disease)   .  Hypertension   . Pneumonia 05/01/2015  . Rotator cuff disorder   . Seasonal asthma    "seasonal"  . Shortness of breath dyspnea   . Walking pneumonia     Patient Active Problem List   Diagnosis Date Noted  . HNP (herniated nucleus pulposus), lumbar 05/05/2016  . Hyperkalemia 12/28/2015  . COPD exacerbation ([place]) 12/25/2015  . Anemia 12/25/2015  . Shortness of breath 12/25/2015  . Cardiomegaly 12/25/2015  . Bipolar 1 disorder, mixed, moderate ([place]) 06/03/2015  . CAP (community acquired pneumonia) 04/30/2015  . Pneumonitis 04/30/2015  . Status asthmaticus 04/30/2015  . Acute respiratory failure with hypoxia ([place]) 04/30/2015  . Dyspnea 04/30/2015  . Hyponatremia, severe 04/30/2015  . Lumbar spondylosis 04/27/2011  . Screening for colon cancer 01/17/2011  . Abnormal EKG 01/17/2011  . Tobacco abuse 01/17/2011  . Tobacco abuse counseling 01/17/2011  . Hypertension   . Chronic low back pain     Past Surgical History:  Procedure Laterality Date  . BACK SURGERY    . CESAREAN SECTION  1986  . LUMBAR MICRODISCECTOMY  01/1990; 2003  . LUMBAR SPINE SURGERY  03/1991   "cleaned up scar tissue"  . POSTERIOR LUMBAR FUSION  2011; 04/27/2011  . REPAIR DURAL / CSF LEAK  02/1990  . SPINAL CORD STIMULATOR IMPLANT  2012  . SPINAL CORD STIMULATOR REMOVAL  08/2010  . TUBAL LIGATION  1986    OB History    No data available       Home Medications  Prior to Admission medications   Medication Sig Start Date End Date Taking? Authorizing Provider  albuterol (PROVENTIL HFA;VENTOLIN HFA) 108 (90 Base) MCG/ACT inhaler Inhale 2 puffs into the lungs every 6 (six) hours as needed for wheezing or shortness of breath. 12/28/15  Yes Dhungel, Nishant, MD  budesonide-formoterol (SYMBICORT) 80-4.5 MCG/ACT inhaler Inhale 2 puffs into the lungs 2 (two) times daily. Patient taking differently: Inhale 2 puffs into the lungs 2 (two) times daily as needed (for wheezing/shortness of breath).  12/28/15  Yes  Dhungel, Nishant, MD  diazepam (VALIUM) 5 MG tablet Take 1 tablet (5 mg total) by mouth 2 (two) times daily. Patient taking differently: Take 5 mg by mouth every 12 (twelve) hours as needed for anxiety or muscle spasms.  09/19/16  Yes Isla Pence, MD  FLUoxetine (PROZAC) 20 MG capsule Take 20 mg by mouth daily.   Yes [provider]  omeprazole (PRILOSEC) 40 MG capsule Take 40 mg by mouth daily.   Yes [provider]  promethazine (PHENERGAN) 25 MG tablet Take 25-50 mg by mouth every 6 (six) hours as needed for nausea or vomiting.    Yes [provider]  SUBOXONE 8-2 MG FILM Place 1 Film under the tongue 2 (two) times daily.   Yes [provider]  tiotropium (SPIRIVA HANDIHALER) 18 MCG inhalation capsule Place 1 capsule (18 mcg total) into inhaler and inhale every morning. Patient taking differently: Place 18 mcg into inhaler and inhale every evening.  12/28/15  Yes Dhungel, Nishant, MD  tiZANidine (ZANAFLEX) 4 MG tablet Take 1 tablet (4 mg total) by mouth every 6 (six) hours as needed for muscle spasms. 05/10/16  Yes Ashok Pall, MD  valsartan-hydrochlorothiazide (DIOVAN-HCT) 160-12.5 MG tablet Take 1 tablet by mouth daily.   Yes [provider]  alum & mag hydroxide-simeth (MAALOX ADVANCED MAX ST) 956-213-08 MG/5ML suspension Take 10 mLs by mouth every 6 (six) hours as needed for indigestion. 11/29/16   Forde Dandy, MD  chlordiazePOXIDE (LIBRIUM) 25 MG capsule 50mg  PO TID x 1D, then 25-50mg  PO BID X 1D, then 25-50mg  PO QD X 1D Patient not taking: Reported on 11/29/2016 06/17/16   Forde Dandy, MD  diazepam (VALIUM) 5 MG tablet Take 1 tablet (5 mg total) by mouth 2 (two) times daily as needed for anxiety or muscle spasms. 11/29/16   Forde Dandy, MD  ondansetron (ZOFRAN ODT) 4 MG disintegrating tablet Take 1 tablet (4 mg total) by mouth every 8 (eight) hours as needed. Patient not taking: Reported on 11/29/2016 09/19/16   Isla Pence, MD  oxyCODONE  (ROXICODONE) 5 MG immediate release tablet Take 1 tablet (5 mg total) by mouth every 6 (six) hours as needed for severe pain. Patient not taking: Reported on 11/29/2016 05/10/16   Ashok Pall, MD  potassium chloride SA (K-DUR,KLOR-CON) 20 MEQ tablet Take 1 tablet (20 mEq total) by mouth 2 (two) times daily. Patient not taking: Reported on 11/29/2016 06/17/16   Forde Dandy, MD  sucralfate (CARAFATE) 1 GM/10ML suspension Take 10 mLs (1 g total) by mouth 4 (four) times daily -  with meals and at bedtime. 11/29/16   Forde Dandy, MD    Family History Family History  Problem Relation Age of Onset  . Stroke Father   . Hypertension Father   . Lung cancer Father     Social History Social History  Substance Use Topics  . Smoking status: Current Every Day Smoker    Packs/day: 1.00  Years: 42.00    Types: Cigarettes  . Smokeless tobacco: Never Used     Comment: prior trial of zyban made her feel weird, pt doesnt want info right now  . Alcohol use No     Allergies   Haldol [haloperidol lactate] and Lyrica [pregabalin]   Review of Systems Review of Systems  Constitutional: Negative for fever.  Gastrointestinal: Positive for abdominal pain, nausea and vomiting. Negative for constipation, diarrhea, hematochezia and melena.  Genitourinary: Negative for dysuria and frequency.  All other systems reviewed and are negative.    Physical Exam Updated Vital Signs BP (!) 108/94 (BP Location: Left Arm)   Pulse 71   Temp 97.9 F (36.6 C) (Oral)   Resp (!) 23   LMP 08/28/2012   SpO2 99%   Physical Exam Physical Exam  Nursing note and vitals reviewed. Constitutional: Non-toxic, and in no acute distress Head: Normocephalic and atraumatic.  Mouth/Throat: Oropharynx is clear and moist.  Neck: Normal range of motion. Neck supple.  Cardiovascular: Normal rate and regular rhythm.   Pulmonary/Chest: Effort normal and breath sounds normal.  Abdominal: Soft. There is epigastric tenderness.  There is no rebound and no guarding.  Musculoskeletal: Normal range of motion.  Neurological: Alert, no facial droop, fluent speech, moves all extremities symmetrically Skin: Skin is warm and dry.  Psychiatric: Cooperative   ED Treatments / Results  Labs (all labs ordered are listed, but only abnormal results are displayed) Labs Reviewed  COMPREHENSIVE METABOLIC PANEL - Abnormal; Notable for the following:       Result Value   Potassium 3.1 (*)    CO2 19 (*)    Glucose, Bld 120 (*)    All other components within normal limits  CBC - Abnormal; Notable for the following:    WBC 15.3 (*)    HCT 35.7 (*)    All other components within normal limits  URINALYSIS, ROUTINE W REFLEX MICROSCOPIC - Abnormal; Notable for the following:    Hgb urine dipstick SMALL (*)    All other components within normal limits  DIFFERENTIAL - Abnormal; Notable for the following:    Neutro Abs 12.8 (*)    All other components within normal limits  URINALYSIS, MICROSCOPIC (REFLEX) - Abnormal; Notable for the following:    Bacteria, UA RARE (*)    Squamous Epithelial / LPF 0-5 (*)    All other components within normal limits  LIPASE, BLOOD    EKG  EKG Interpretation  Date/Time:  Tuesday November 29 2016 08:42:57 EDT Ventricular Rate:  71 PR Interval:    QRS Duration: 85 QT Interval:  394 QTC Calculation: 429 R Axis:   43 Text Interpretation:  Sinus rhythm Anteroseptal infarct, old No acute changes Confirmed by Brantley Stage [id number]) on 11/29/2016 9:06:17 AM       Radiology No results found.  Procedures Procedures (including critical care time)  Medications Ordered in ED Medications  sodium chloride 0.9 % bolus 1,000 mL (0 mLs Intravenous Stopped 11/29/16 1030)  diazepam (VALIUM) injection 5 mg (5 mg Intravenous Given 11/29/16 0847)  metoCLOPramide (REGLAN) injection 10 mg (10 mg Intravenous Given 11/29/16 0846)  gi cocktail (Maalox,Lidocaine,Donnatal) (30 mLs Oral Given 11/29/16 0844)  potassium  chloride SA (K-DUR,KLOR-CON) CR tablet 40 mEq (40 mEq Oral Given 11/29/16 1030)     Initial Impression / Assessment and Plan / ED Course  I have reviewed the triage vital signs and the nursing notes.  Pertinent labs & imaging results that were available during my care  of the patient were reviewed by me and considered in my medical decision making (see chart for details).     Records reviewed. Patient seen in July for what she reports are the same symptoms. She had CT abd/pelvis at that time with small hiatal hernia, but no other acute processes. I took care of her in April 2018 for the same symptoms. She states that her symptoms are the exact same. States she has been told to follow-up with GI, but states she never have called to set up appointment.  Her abdomen is soft non-surgical on exam, with focal epigastric tenderness. Given IVF, reglan, valium and gi cocktail. On re-evaluation, she is comfortably resting. States her symptoms improved and resolved. Tolerating diet coke and crackers. Blood work with mild leukocytosis, likely reactive to nausea,vomiting. No concerns for serious intraabdominal infection or surgical processes. UA unremarkable. Blood work with mild hypokalemia and given oral repletion.  Continues to feel well. Abdomen continues to be benign. Discharge home with supportive care. Possible gastritis or PUD. Given GI referral. To discontinue naprosyn/ibuprofen. Strict return and follow-up instructions reviewed. She expressed understanding of all discharge instructions and felt comfortable with the plan of care.     Final Clinical Impressions(s) / ED Diagnoses   Final diagnoses:  Epigastric abdominal pain  Gastritis without bleeding, unspecified chronicity, unspecified gastritis type  Non-intractable vomiting with nausea, unspecified vomiting type    New Prescriptions New Prescriptions   ALUM & MAG HYDROXIDE-SIMETH (MAALOX ADVANCED MAX ST) 400-400-40 MG/5ML SUSPENSION     Take 10 mLs by mouth every 6 (six) hours as needed for indigestion.   DIAZEPAM (VALIUM) 5 MG TABLET    Take 1 tablet (5 mg total) by mouth 2 (two) times daily as needed for anxiety or muscle spasms.   SUCRALFATE (CARAFATE) 1 GM/10ML SUSPENSION    Take 10 mLs (1 g total) by mouth 4 (four) times daily -  with meals and at bedtime.     Forde Dandy, MD 11/29/16 [phone]

## 2016-11-29 NOTE — ED Triage Notes (Signed)
Per EMS-states abdominal pain that woke her up at 3 am-states chronic issue-has not been to GI MD for work-up-100 mg of Fentanyl and 4 mg of Zofran given in route

## 2016-11-29 NOTE — ED Notes (Signed)
Bed: WA18 Expected date:  Expected time:  Means of arrival:  Comments: EMS-abdominal pain 

## 2016-11-30 DIAGNOSIS — Z79891 Long term (current) use of opiate analgesic: Secondary | ICD-10-CM | POA: Diagnosis not present

## 2016-12-01 ENCOUNTER — Ambulatory Visit (INDEPENDENT_AMBULATORY_CARE_PROVIDER_SITE_OTHER): Payer: Medicare Other | Admitting: Orthopedic Surgery

## 2016-12-01 DIAGNOSIS — M7541 Impingement syndrome of right shoulder: Secondary | ICD-10-CM

## 2016-12-01 DIAGNOSIS — M7542 Impingement syndrome of left shoulder: Secondary | ICD-10-CM | POA: Insufficient documentation

## 2016-12-01 MED ORDER — METHYLPREDNISOLONE ACETATE 40 MG/ML IJ SUSP
40.0000 mg | INTRAMUSCULAR | Status: AC | PRN
  Administered 2016-12-01: 40 mg via INTRA_ARTICULAR

## 2016-12-01 MED ORDER — DIAZEPAM 5 MG PO TABS: 5.0000 mg | ORAL_TABLET | Freq: Two times a day (BID) | ORAL | 0 refills | Status: DC | PRN

## 2016-12-01 MED ORDER — LIDOCAINE HCL 1 % IJ SOLN
5.0000 mL | INTRAMUSCULAR | Status: AC | PRN
  Administered 2016-12-01: 5 mL

## 2016-12-01 NOTE — Progress Notes (Signed)
Office Visit Note   Patient: Madeline Dickerson           Date of Birth: Jul 16, 1960           MRN: [id number] Visit Date: 12/01/2016              Requested by: Lucianne Lei, [address] [place], [place] [address] PCP: Helen Hashimoto., MD  Chief Complaint  Patient presents with  . Left Shoulder - Pain  . Right Shoulder - Pain      HPI: Patient is a 56 year old woman who presents stating that she's had off and on abdominal pain for 30 years she states she is going to go see a GI doctor. Patient states she is going to the emergency room several times for her abdominal pain she states that due to her shoulder pain she was also going to go to the emergency room and call 911. Patient states that her shoulder pain is usually 8 out of 10.  Assessment & Plan: Visit Diagnoses:  1. Impingement syndrome of right shoulder   2. Impingement syndrome of left shoulder     Plan: Both shoulders were injected she tolerated this well she is given a prescription for Valium to get her through until she can see her primary care physician.  Follow-Up Instructions: Return if symptoms worsen or fail to improve.   Ortho Exam  Patient is alert, oriented, no adenopathy, well-dressed, normal affect, normal respiratory effort. Examination patient has full range of motion of both shoulders. Thoracic outlet is nontender she has no radicular symptoms no weakness in either upper extremity. She does have pain with Neer and Hawkins impingement test pain with drop arm test. The biceps tendon is tender to palpation.  Imaging: No results found. No images are attached to the encounter.  Labs: Lab Results  Component Value Date   HGBA1C 6.0 (H) 12/26/2015   HGBA1C 6.0 04/01/2011   REPTSTATUS 12/30/2015 FINAL 12/27/2015   GRAMSTAIN  12/27/2015    MODERATE WBC PRESENT,BOTH PMN AND MONONUCLEAR RARE SQUAMOUS EPITHELIAL CELLS PRESENT FEW GRAM POSITIVE COCCI IN PAIRS RARE BUDDING YEAST SEEN    CULT   12/27/2015    Consistent with normal respiratory flora. Performed at [facility] 04/30/2015    Orders:  Orders Placed This Encounter  Procedures  . Large Joint Injection/Arthrocentesis  . Large Joint Injection/Arthrocentesis   Meds ordered this encounter  Medications  . diazepam (VALIUM) 5 MG tablet    Sig: Take 1 tablet (5 mg total) by mouth 2 (two) times daily as needed for anxiety or muscle spasms.    Dispense:  20 tablet    Refill:  0     Procedures: Large Joint Inj Date/Time: 12/01/2016 9:13 AM Performed by: DUDA, MARCUS V Authorized by: Newt Minion   Consent Given by:  Patient Site marked: the procedure site was marked   Timeout: prior to procedure the correct patient, procedure, and site was verified   Indications:  Pain and diagnostic evaluation Location:  Shoulder Site:  R subacromial bursa Prep: patient was prepped and draped in usual sterile fashion   Needle Size:  22 G Needle Length:  1.5 inches Approach:  Posterior Ultrasound Guidance: No   Fluoroscopic Guidance: No   Arthrogram: No   Medications:  5 mL lidocaine 1 %; 40 mg methylPREDNISolone acetate 40 MG/ML Aspiration Attempted: No   Patient tolerance:  Patient tolerated the procedure well with no  immediate complications Large Joint Inj Date/Time: 12/01/2016 9:13 AM Performed by: DUDA, MARCUS V Authorized by: Newt Minion   Consent Given by:  Patient Site marked: the procedure site was marked   Timeout: prior to procedure the correct patient, procedure, and site was verified   Indications:  Pain and diagnostic evaluation Location:  Shoulder Site:  L subacromial bursa Prep: patient was prepped and draped in usual sterile fashion   Needle Size:  22 G Needle Length:  1.5 inches Approach:  Posterior Ultrasound Guidance: No   Fluoroscopic Guidance: No   Arthrogram: No   Medications:  5 mL lidocaine 1 %; 40 mg methylPREDNISolone acetate 40 MG/ML Aspiration  Attempted: No   Patient tolerance:  Patient tolerated the procedure well with no immediate complications    Clinical Data: No additional findings.  ROS:  All other systems negative, except as noted in the HPI. Review of Systems  Objective: Vital Signs: LMP 08/28/2012   Specialty Comments:  No specialty comments available.  PMFS History: Patient Active Problem List   Diagnosis Date Noted  . Impingement syndrome of right shoulder 12/01/2016  . Impingement syndrome of left shoulder 12/01/2016  . HNP (herniated nucleus pulposus), lumbar 05/05/2016  . Hyperkalemia 12/28/2015  . COPD exacerbation ([place]) 12/25/2015  . Anemia 12/25/2015  . Shortness of breath 12/25/2015  . Cardiomegaly 12/25/2015  . Bipolar 1 disorder, mixed, moderate ([place]) 06/03/2015  . CAP (community acquired pneumonia) 04/30/2015  . Pneumonitis 04/30/2015  . Status asthmaticus 04/30/2015  . Acute respiratory failure with hypoxia ([place]) 04/30/2015  . Dyspnea 04/30/2015  . Hyponatremia, severe 04/30/2015  . Lumbar spondylosis 04/27/2011  . Screening for colon cancer 01/17/2011  . Abnormal EKG 01/17/2011  . Tobacco abuse 01/17/2011  . Tobacco abuse counseling 01/17/2011  . Hypertension   . Chronic low back pain    Past Medical History:  Diagnosis Date  . Anxiety    hx  . Arthritis    "lower back, hands" (05/01/2015)  . Chronic low back pain    disk s/p 4 diskectomies, 5th fursion, then spinal cord stimulator ([facility])  . Depression   . Dysrhythmia    irruglar heart beat "nothing wrong"   . Fibromyalgia   . GERD (gastroesophageal reflux disease)   . Hypertension   . Pneumonia 05/01/2015  . Rotator cuff disorder   . Seasonal asthma    "seasonal"  . Shortness of breath dyspnea   . Walking pneumonia     Family History  Problem Relation Age of Onset  . Stroke Father   . Hypertension Father   . Lung cancer Father     Past Surgical History:  Procedure Laterality Date  . BACK SURGERY    .  CESAREAN SECTION  1986  . LUMBAR MICRODISCECTOMY  01/1990; 2003  . LUMBAR SPINE SURGERY  03/1991   "cleaned up scar tissue"  . POSTERIOR LUMBAR FUSION  2011; 04/27/2011  . REPAIR DURAL / CSF LEAK  02/1990  . SPINAL CORD STIMULATOR IMPLANT  2012  . SPINAL CORD STIMULATOR REMOVAL  08/2010  . TUBAL LIGATION  1986   Social History   Occupational History  . Not on file.   Social History Main Topics  . Smoking status: Current Every Day Smoker    Packs/day: 1.00    Years: 42.00    Types: Cigarettes  . Smokeless tobacco: Never Used     Comment: prior trial of zyban made her feel weird, pt doesnt want info right now  . Alcohol use  No  . Drug use: Yes    Types: Marijuana     Comment: 05/01/2015 "I've used lots of multiple street drugs; nothing in the past couple years", "i use weed once a month"  . Sexual activity: Yes

## 2016-12-14 DIAGNOSIS — Z79891 Long term (current) use of opiate analgesic: Secondary | ICD-10-CM | POA: Diagnosis not present

## 2016-12-22 DIAGNOSIS — I10 Essential (primary) hypertension: Secondary | ICD-10-CM | POA: Diagnosis not present

## 2016-12-22 DIAGNOSIS — J449 Chronic obstructive pulmonary disease, unspecified: Secondary | ICD-10-CM | POA: Diagnosis not present

## 2016-12-22 DIAGNOSIS — K219 Gastro-esophageal reflux disease without esophagitis: Secondary | ICD-10-CM | POA: Diagnosis not present

## 2017-01-03 DIAGNOSIS — Z79891 Long term (current) use of opiate analgesic: Secondary | ICD-10-CM | POA: Diagnosis not present

## 2017-01-17 DIAGNOSIS — Z79891 Long term (current) use of opiate analgesic: Secondary | ICD-10-CM | POA: Diagnosis not present

## 2017-01-24 DIAGNOSIS — Z0000 Encounter for general adult medical examination without abnormal findings: Secondary | ICD-10-CM | POA: Diagnosis not present

## 2017-01-24 DIAGNOSIS — Z1331 Encounter for screening for depression: Secondary | ICD-10-CM | POA: Diagnosis not present

## 2017-01-24 DIAGNOSIS — Z9181 History of falling: Secondary | ICD-10-CM | POA: Diagnosis not present

## 2017-02-01 DIAGNOSIS — Z79891 Long term (current) use of opiate analgesic: Secondary | ICD-10-CM | POA: Diagnosis not present

## 2017-02-16 DIAGNOSIS — Z79891 Long term (current) use of opiate analgesic: Secondary | ICD-10-CM | POA: Diagnosis not present

## 2017-03-02 DIAGNOSIS — Z79891 Long term (current) use of opiate analgesic: Secondary | ICD-10-CM | POA: Diagnosis not present

## 2017-03-15 DIAGNOSIS — Z79891 Long term (current) use of opiate analgesic: Secondary | ICD-10-CM | POA: Diagnosis not present

## 2017-03-29 DIAGNOSIS — J449 Chronic obstructive pulmonary disease, unspecified: Secondary | ICD-10-CM | POA: Diagnosis not present

## 2017-03-29 DIAGNOSIS — J069 Acute upper respiratory infection, unspecified: Secondary | ICD-10-CM | POA: Diagnosis not present

## 2017-04-13 DIAGNOSIS — Z79891 Long term (current) use of opiate analgesic: Secondary | ICD-10-CM | POA: Diagnosis not present

## 2017-04-14 IMAGING — CR DG LUMBAR SPINE COMPLETE 4+V
6 series · 6 of 6 positions shown · non-contrast
Comparison: None.

CLINICAL DATA: Back pain for 1 week, left leg pain

EXAM:
LUMBAR SPINE - COMPLETE 4+ VIEW

[t lumbar spine ap]
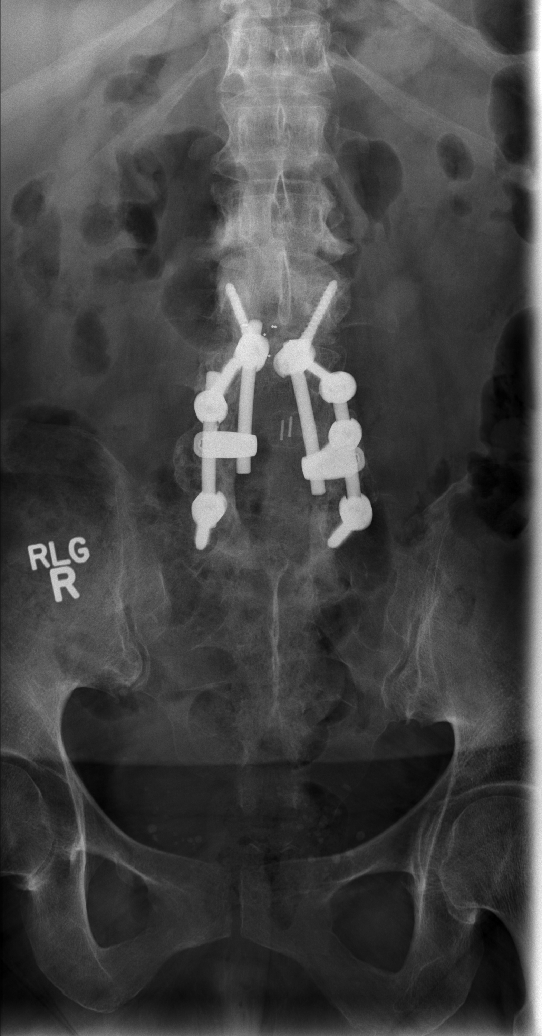

[t lumbar spine obl (1 of 3)]
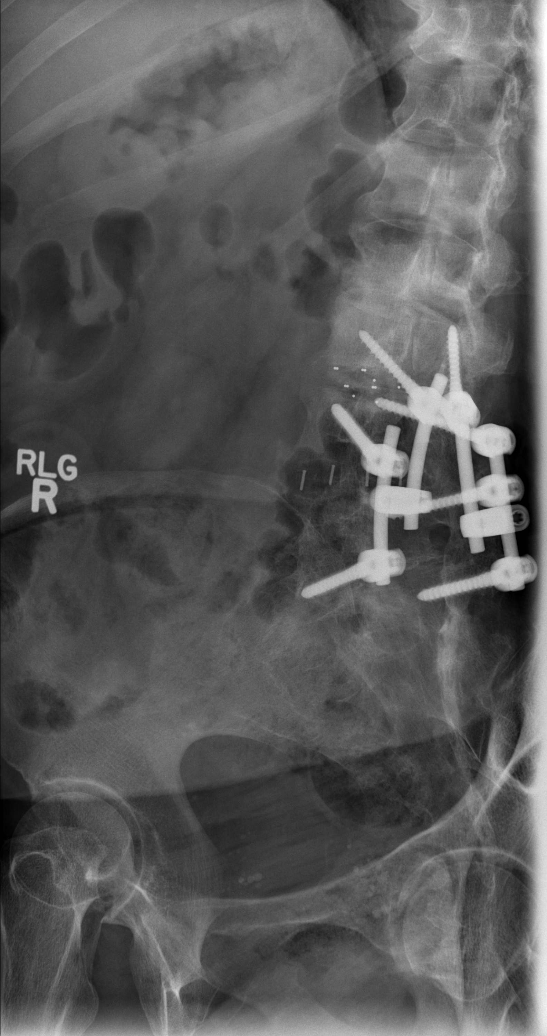

[t lumbar spine obl (2 of 3)]
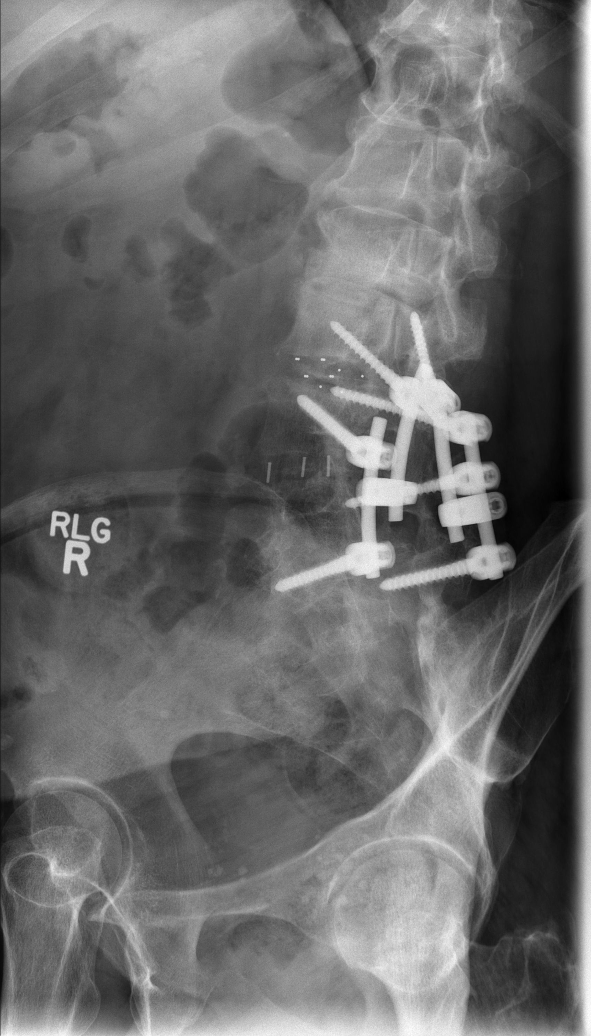

[t lumbar spine obl (3 of 3)]
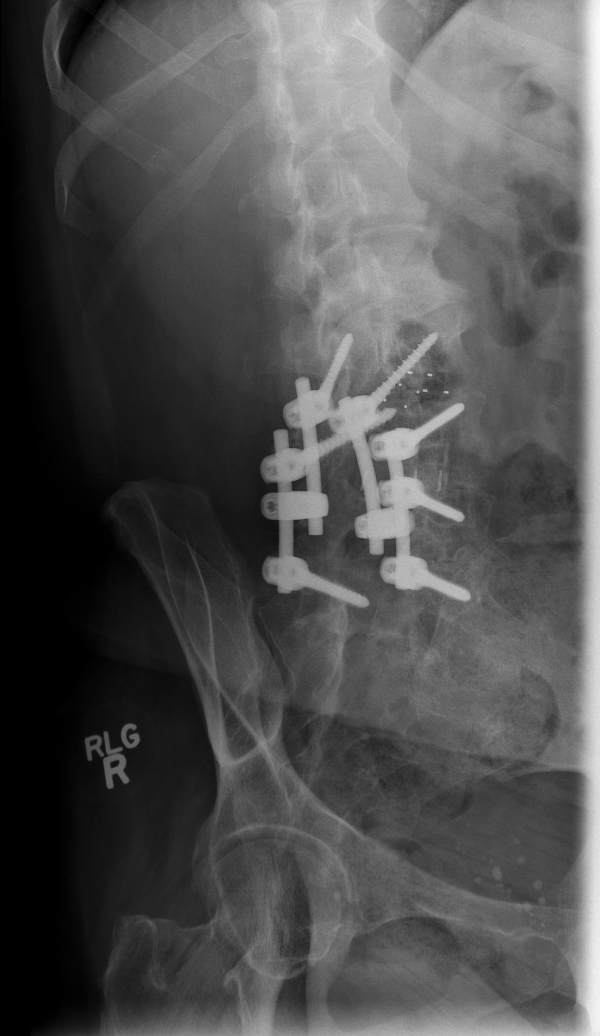

[t lumbar spine lat]
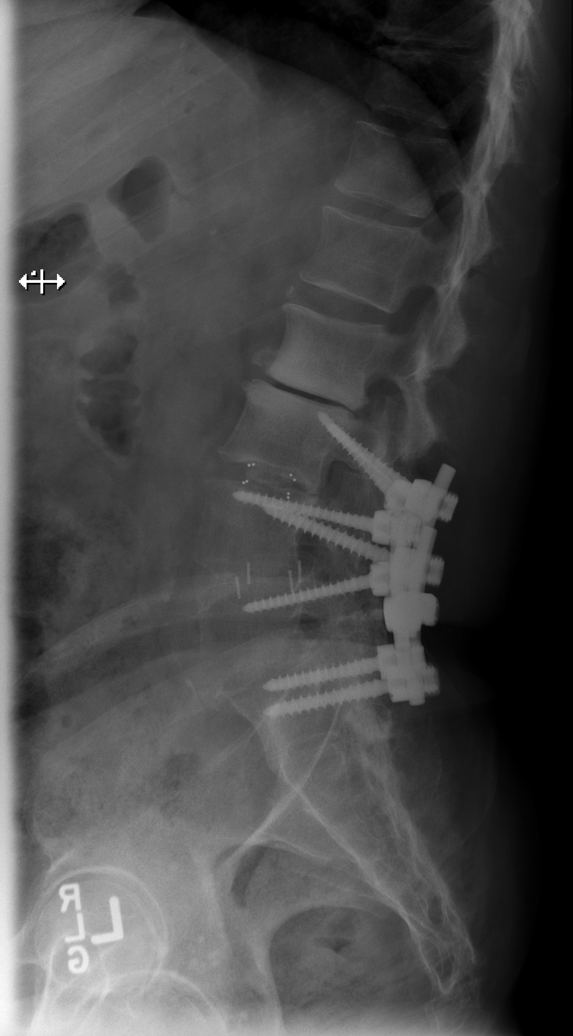

[t lumbar l-5 s-1 spot]
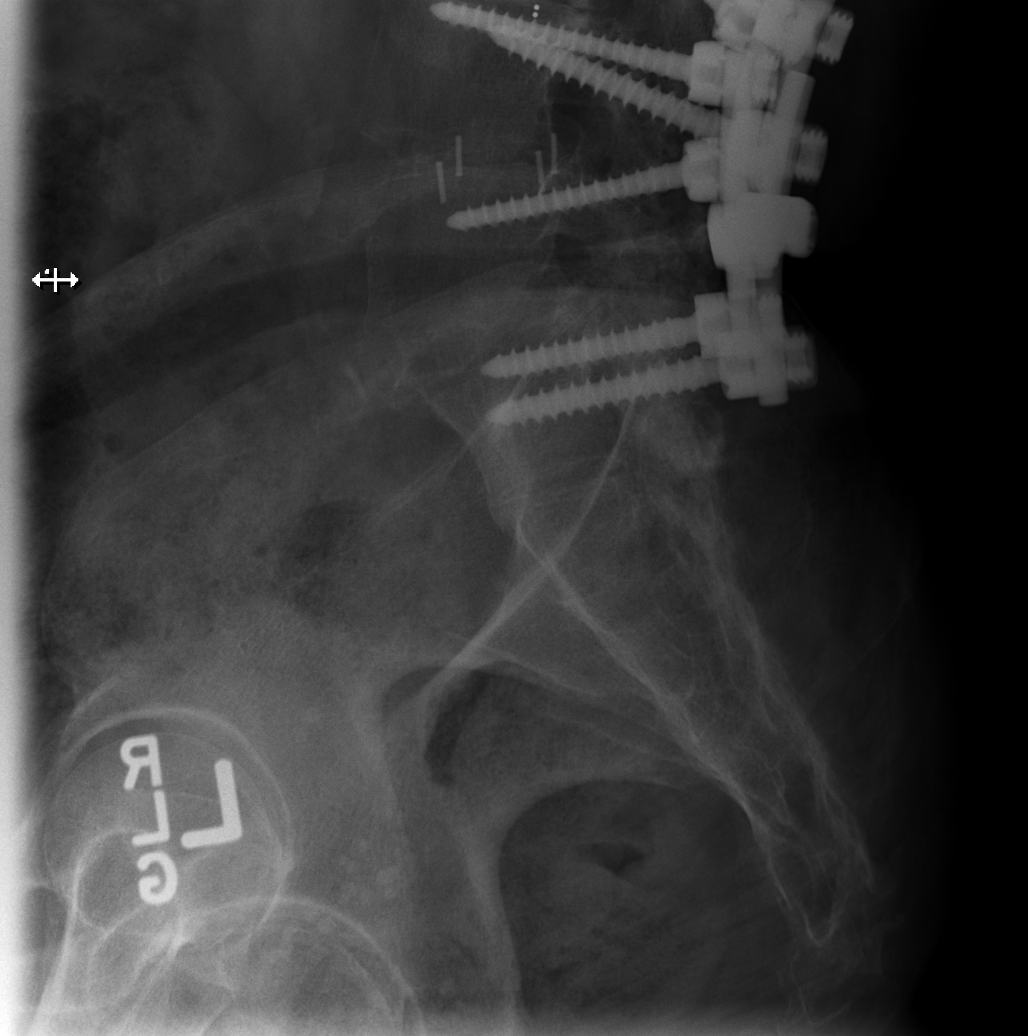

[6 of 6 positions shown; findings below may reference images not displayed]

FINDINGS: Six views of the lumbar spine submitted. Again noted posterior
fusion with transpedicular screws and metallic rods at L3, L4, L5
and S1 level. There is no change in alignment from prior exam. There
is significant disc space flattening with endplate sclerotic changes
mild anterior spurring endplate sclerotic changes and vacuum disc
phenomenon at L2-L3 level with progression from prior exam.
IMPRESSION: No acute fracture. Again noted posterior fusion L3, L4-L5 S1 level
without change in alignment. The fixation material appears intact.
Progression of degenerative changes at L2-L3 level.

## 2017-04-17 DIAGNOSIS — K219 Gastro-esophageal reflux disease without esophagitis: Secondary | ICD-10-CM | POA: Diagnosis not present

## 2017-04-17 DIAGNOSIS — R112 Nausea with vomiting, unspecified: Secondary | ICD-10-CM | POA: Diagnosis not present

## 2017-04-27 DIAGNOSIS — Z79891 Long term (current) use of opiate analgesic: Secondary | ICD-10-CM | POA: Diagnosis not present

## 2017-05-11 DIAGNOSIS — Z79891 Long term (current) use of opiate analgesic: Secondary | ICD-10-CM | POA: Diagnosis not present

## 2017-05-23 DIAGNOSIS — I10 Essential (primary) hypertension: Secondary | ICD-10-CM | POA: Diagnosis not present

## 2017-05-23 DIAGNOSIS — J449 Chronic obstructive pulmonary disease, unspecified: Secondary | ICD-10-CM | POA: Diagnosis not present

## 2017-05-23 DIAGNOSIS — Z79899 Other long term (current) drug therapy: Secondary | ICD-10-CM | POA: Diagnosis not present

## 2017-05-28 ENCOUNTER — Emergency Department (HOSPITAL_COMMUNITY)
Admission: EM | Admit: 2017-05-28 | Discharge: 2017-05-29 | Disposition: A | Discharge status: Home / Self Care | Payer: Medicare Other | Attending: Emergency Medicine | Admitting: Emergency Medicine

## 2017-05-28 ENCOUNTER — Other Ambulatory Visit: Payer: Self-pay

## 2017-05-28 DIAGNOSIS — R1013 Epigastric pain: Secondary | ICD-10-CM | POA: Diagnosis not present

## 2017-05-28 DIAGNOSIS — F1193 Opioid use, unspecified with withdrawal: Secondary | ICD-10-CM

## 2017-05-28 DIAGNOSIS — Z79899 Other long term (current) drug therapy: Secondary | ICD-10-CM | POA: Diagnosis not present

## 2017-05-28 DIAGNOSIS — F1123 Opioid dependence with withdrawal: Secondary | ICD-10-CM | POA: Diagnosis not present

## 2017-05-28 DIAGNOSIS — R109 Unspecified abdominal pain: Secondary | ICD-10-CM | POA: Diagnosis not present

## 2017-05-28 DIAGNOSIS — I10 Essential (primary) hypertension: Secondary | ICD-10-CM | POA: Insufficient documentation

## 2017-05-28 DIAGNOSIS — J449 Chronic obstructive pulmonary disease, unspecified: Secondary | ICD-10-CM | POA: Diagnosis not present

## 2017-05-28 DIAGNOSIS — F12188 Cannabis abuse with other cannabis-induced disorder: Secondary | ICD-10-CM | POA: Insufficient documentation

## 2017-05-28 DIAGNOSIS — F17210 Nicotine dependence, cigarettes, uncomplicated: Secondary | ICD-10-CM | POA: Diagnosis not present

## 2017-05-28 DIAGNOSIS — R1084 Generalized abdominal pain: Secondary | ICD-10-CM | POA: Diagnosis not present

## 2017-05-28 DIAGNOSIS — R112 Nausea with vomiting, unspecified: Secondary | ICD-10-CM | POA: Diagnosis not present

## 2017-05-28 LAB — URINALYSIS, ROUTINE W REFLEX MICROSCOPIC
Bilirubin Urine: NEGATIVE
Glucose, UA: NEGATIVE mg/dL
Ketones, ur: 20 mg/dL — AB
Leukocytes, UA: NEGATIVE
Nitrite: NEGATIVE
Protein, ur: 30 mg/dL — AB
Specific Gravity, Urine: 1.019 (ref 1.005–1.030)
pH: 6 (ref 5.0–8.0)

## 2017-05-28 LAB — COMPREHENSIVE METABOLIC PANEL
ALT: 23 U/L (ref 14–54)
AST: 30 U/L (ref 15–41)
Albumin: 4.2 g/dL (ref 3.5–5.0)
Alkaline Phosphatase: 119 U/L (ref 38–126)
Anion gap: 15 (ref 5–15)
BUN: 15 mg/dL (ref 6–20)
CO2: 18 mmol/L — ABNORMAL LOW (ref 22–32)
Calcium: 9.4 mg/dL (ref 8.9–10.3)
Chloride: 98 mmol/L — ABNORMAL LOW (ref 101–111)
Creatinine, Ser: 0.91 mg/dL (ref 0.44–1.00)
GFR calc Af Amer: >60 mL/min (ref >60)
GFR calc non Af Amer: >60 mL/min (ref >60)
Glucose, Bld: 104 mg/dL — ABNORMAL HIGH (ref 65–99)
Potassium: 3.5 mmol/L (ref 3.5–5.1)
Sodium: 131 mmol/L — ABNORMAL LOW (ref 135–145)
Total Bilirubin: 1.2 mg/dL (ref 0.3–1.2)
Total Protein: 8.1 g/dL (ref 6.5–8.1)

## 2017-05-28 LAB — LIPASE, BLOOD: Lipase: 19 U/L (ref 11–51)

## 2017-05-28 LAB — CBC
HCT: 42.3 % (ref 36.0–46.0)
Hemoglobin: 14.6 g/dL (ref 12.0–15.0)
MCH: 30.5 pg (ref 26.0–34.0)
MCHC: 34.5 g/dL (ref 30.0–36.0)
MCV: 88.3 fL (ref 78.0–100.0)
Platelets: 314 10*3/uL (ref 150–400)
RBC: 4.79 MIL/uL (ref 3.87–5.11)
RDW: 14 % (ref 11.5–15.5)
WBC: 14.4 10*3/uL — ABNORMAL HIGH (ref 4.0–10.5)

## 2017-05-28 LAB — I-STAT BETA HCG BLOOD, ED (MC, WL, AP ONLY): I-stat hCG, quantitative: 12.9 m[IU]/mL — ABNORMAL HIGH (ref <5)

## 2017-05-28 MED ORDER — ONDANSETRON 4 MG PO TBDP
4.0000 mg | ORAL_TABLET | Freq: Once | ORAL | Status: AC | PRN
  Administered 2017-05-28: 4 mg via ORAL
  Filled 2017-05-28: qty 1

## 2017-05-28 NOTE — ED Triage Notes (Signed)
Pt arrived by EMS for abd pain that started on Friday. Pain localized to epigastric region with nausea. 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 Pt also has been out of her blood pressure medications and is waiting for her losartan to be delivered

## 2017-05-29 ENCOUNTER — Emergency Department (HOSPITAL_COMMUNITY): Payer: Medicare Other

## 2017-05-29 ENCOUNTER — Encounter (HOSPITAL_COMMUNITY): Payer: Self-pay | Admitting: Emergency Medicine

## 2017-05-29 DIAGNOSIS — R1013 Epigastric pain: Secondary | ICD-10-CM | POA: Diagnosis not present

## 2017-05-29 LAB — RAPID URINE DRUG SCREEN, HOSP PERFORMED
Amphetamines: NOT DETECTED
Barbiturates: NOT DETECTED
Benzodiazepines: POSITIVE — AB
Cocaine: NOT DETECTED
Opiates: NOT DETECTED
Tetrahydrocannabinol: POSITIVE — AB

## 2017-05-29 MED ORDER — CAPSAICIN 0.025 % EX CREA
TOPICAL_CREAM | Freq: Two times a day (BID) | CUTANEOUS | Status: DC
  Administered 2017-05-29: 05:00:00 via TOPICAL
  Filled 2017-05-29: qty 60

## 2017-05-29 MED ORDER — STERILE WATER FOR INJECTION IJ SOLN
INTRAMUSCULAR | Status: AC
  Administered 2017-05-29: 05:00:00
  Filled 2017-05-29: qty 10

## 2017-05-29 MED ORDER — PROMETHAZINE HCL 12.5 MG PO TABS: 25.0000 mg | ORAL_TABLET | Freq: Three times a day (TID) | ORAL | 0 refills | Status: DC | PRN

## 2017-05-29 MED ORDER — GI COCKTAIL ~~LOC~~
30.0000 mL | Freq: Once | ORAL | Status: AC
  Administered 2017-05-29: 30 mL via ORAL
  Filled 2017-05-29: qty 30

## 2017-05-29 MED ORDER — OLANZAPINE 10 MG IM SOLR
5.0000 mg | Freq: Once | INTRAMUSCULAR | Status: AC
  Administered 2017-05-29: 5 mg via INTRAMUSCULAR
  Filled 2017-05-29: qty 10

## 2017-05-29 MED ORDER — SODIUM CHLORIDE 0.9 % IV BOLUS
1000.0000 mL | Freq: Once | INTRAVENOUS | Status: AC
  Administered 2017-05-29: 1000 mL via INTRAVENOUS

## 2017-05-29 MED ORDER — KETOROLAC TROMETHAMINE 30 MG/ML IJ SOLN
30.0000 mg | Freq: Once | INTRAMUSCULAR | Status: AC
  Administered 2017-05-29: 30 mg via INTRAVENOUS
  Filled 2017-05-29: qty 1

## 2017-05-29 NOTE — ED Notes (Signed)
Spoke w/ lab to inquire about adding on uds to previously collected urine. Sts she will check to see if theres any left.

## 2017-05-29 NOTE — ED Notes (Signed)
Pt given bag lunch and PO fluids. Tolerating both well. Showing NAD.

## 2017-05-29 NOTE — ED Provider Notes (Signed)
[facility] EMERGENCY DEPARTMENT Provider Note   CSN: [id number] Arrival date & time: 05/28/17  2218     History   Chief Complaint Chief Complaint  Patient presents with  . Abdominal Pain    HPI Madeline Dickerson is a 57 y.o. female.  The history is provided by the patient.  Abdominal Pain   This is a chronic problem. The current episode started more than 1 week ago. The problem occurs constantly. The problem has not changed since onset.The pain is associated with an unknown factor. The pain is located in the generalized abdominal region. The quality of the pain is cramping. The pain is moderate. Associated symptoms include nausea and vomiting. Pertinent negatives include anorexia, fever, belching, diarrhea, flatus, hematochezia, melena, constipation, dysuria, frequency, hematuria, headaches, arthralgias and myalgias. Nothing aggravates the symptoms. Nothing relieves the symptoms. Past workup includes GI consult and CT scan. Her past medical history does not include PUD.  Patient with a h/o narcotic abuse on suboxone presents with acute on chronic vomiting and abdominal pain.  States she has been out since Friday and does not have an appointment until Wednesday.  Moreover, she has these symptoms most of the year.  She states she has had many work ups for this and no one knows why this is happening.  She also states she is anxious and is in need of xanax.  No urinary symptoms.  No diarrhea.  No f/c/r.    Past Medical History:  Diagnosis Date  . Anxiety    hx  . Arthritis    "lower back, hands" (05/01/2015)  . Chronic low back pain    disk s/p 4 diskectomies, 5th fursion, then spinal cord stimulator ([place])  . Depression   . Dysrhythmia    irruglar heart beat "nothing wrong"   . Fibromyalgia   . GERD (gastroesophageal reflux disease)   . Hypertension   . Pneumonia 05/01/2015  . Rotator cuff disorder   . Seasonal asthma    "seasonal"  . Shortness of breath dyspnea    . Walking pneumonia     Patient Active Problem List   Diagnosis Date Noted  . Impingement syndrome of right shoulder 12/01/2016  . Impingement syndrome of left shoulder 12/01/2016  . HNP (herniated nucleus pulposus), lumbar 05/05/2016  . Hyperkalemia 12/28/2015  . COPD exacerbation ([place]) 12/25/2015  . Anemia 12/25/2015  . Shortness of breath 12/25/2015  . Cardiomegaly 12/25/2015  . Bipolar 1 disorder, mixed, moderate ([place]) 06/03/2015  . CAP (community acquired pneumonia) 04/30/2015  . Pneumonitis 04/30/2015  . Status asthmaticus 04/30/2015  . Acute respiratory failure with hypoxia ([place]) 04/30/2015  . Dyspnea 04/30/2015  . Hyponatremia, severe 04/30/2015  . Lumbar spondylosis 04/27/2011  . Screening for colon cancer 01/17/2011  . Abnormal EKG 01/17/2011  . Tobacco abuse 01/17/2011  . Tobacco abuse counseling 01/17/2011  . Hypertension   . Chronic low back pain     Past Surgical History:  Procedure Laterality Date  . BACK SURGERY    . CESAREAN SECTION  1986  . LUMBAR MICRODISCECTOMY  01/1990; 2003  . LUMBAR SPINE SURGERY  03/1991   "cleaned up scar tissue"  . POSTERIOR LUMBAR FUSION  2011; 04/27/2011  . REPAIR DURAL / CSF LEAK  02/1990  . SPINAL CORD STIMULATOR IMPLANT  2012  . SPINAL CORD STIMULATOR REMOVAL  08/2010  . TUBAL LIGATION  1986     OB History   None      Home Medications    Prior  to Admission medications   Medication Sig Start Date End Date Taking? Authorizing Provider  albuterol (PROVENTIL HFA;VENTOLIN HFA) 108 (90 Base) MCG/ACT inhaler Inhale 2 puffs into the lungs every 6 (six) hours as needed for wheezing or shortness of breath. 12/28/15   Dhungel, Flonnie Overman, MD  alum & mag hydroxide-simeth (MAALOX ADVANCED MAX ST) [phone] MG/5ML suspension Take 10 mLs by mouth every 6 (six) hours as needed for indigestion. 11/29/16   Forde Dandy, MD  budesonide-formoterol [facility]) 80-4.5 MCG/ACT inhaler Inhale 2 puffs into the lungs 2 (two) times  daily. Patient not taking: Reported on 12/01/2016 12/28/15   Dhungel, Flonnie Overman, MD  chlordiazePOXIDE (LIBRIUM) 25 MG capsule 50mg  PO TID x 1D, then 25-50mg  PO BID X 1D, then 25-50mg  PO QD X 1D Patient not taking: Reported on 11/29/2016 06/17/16   Forde Dandy, MD  diazepam (VALIUM) 5 MG tablet Take 1 tablet (5 mg total) by mouth 2 (two) times daily. Patient taking differently: Take 5 mg by mouth every 12 (twelve) hours as needed for anxiety or muscle spasms.  09/19/16   Isla Pence, MD  diazepam (VALIUM) 5 MG tablet Take 1 tablet (5 mg total) by mouth 2 (two) times daily as needed for anxiety or muscle spasms. 12/01/16   Newt Minion, MD  FLUoxetine (PROZAC) 20 MG capsule Take 20 mg by mouth daily.    [provider]  metoCLOPramide (REGLAN) 10 MG tablet Take 1 tablet (10 mg total) by mouth every 6 (six) hours as needed for nausea or vomiting. 11/29/16   Forde Dandy, MD  omeprazole (PRILOSEC) 40 MG capsule Take 40 mg by mouth daily.    [provider]  ondansetron (ZOFRAN ODT) 4 MG disintegrating tablet Take 1 tablet (4 mg total) by mouth every 8 (eight) hours as needed. Patient not taking: Reported on 11/29/2016 09/19/16   Isla Pence, MD  oxyCODONE (ROXICODONE) 5 MG immediate release tablet Take 1 tablet (5 mg total) by mouth every 6 (six) hours as needed for severe pain. Patient not taking: Reported on 11/29/2016 05/10/16   Ashok Pall, MD  potassium chloride SA (K-DUR,KLOR-CON) 20 MEQ tablet Take 1 tablet (20 mEq total) by mouth 2 (two) times daily. Patient not taking: Reported on 11/29/2016 06/17/16   Forde Dandy, MD  promethazine (PHENERGAN) 25 MG tablet Take 25-50 mg by mouth every 6 (six) hours as needed for nausea or vomiting.     [provider]  SUBOXONE 8-2 MG FILM Place 1 Film under the tongue 2 (two) times daily.    [provider]  sucralfate (CARAFATE) 1 GM/10ML suspension Take 10 mLs (1 g total) by mouth 4 (four) times daily -  with meals  and at bedtime. 11/29/16   Forde Dandy, MD  tiotropium (SPIRIVA HANDIHALER) 18 MCG inhalation capsule Place 1 capsule (18 mcg total) into inhaler and inhale every morning. Patient taking differently: Place 18 mcg into inhaler and inhale every evening.  12/28/15   Dhungel, Nishant, MD  tiZANidine (ZANAFLEX) 4 MG tablet Take 1 tablet (4 mg total) by mouth every 6 (six) hours as needed for muscle spasms. 05/10/16   Ashok Pall, MD  valsartan-hydrochlorothiazide (DIOVAN-HCT) 160-12.5 MG tablet Take 1 tablet by mouth daily.    [provider]    Family History Family History  Problem Relation Age of Onset  . Stroke Father   . Hypertension Father   . Lung cancer Father     Social History Social History   Tobacco Use  .  Smoking status: Current Every Day Smoker    Packs/day: 1.00    Years: 42.00    Pack years: 42.00    Types: Cigarettes  . Smokeless tobacco: Never Used  . Tobacco comment: prior trial of zyban made her feel weird, pt doesnt want info right now  Substance Use Topics  . Alcohol use: No  . Drug use: Yes    Types: Marijuana    Comment: 05/01/2015 "I've used lots of multiple street drugs; nothing in the past couple years", "i use weed once a month"     Allergies   Haldol [haloperidol lactate] and Lyrica [pregabalin]   Review of Systems Review of Systems  Constitutional: Negative for fever.  Respiratory: Negative for shortness of breath.   Cardiovascular: Negative for chest pain, palpitations and leg swelling.  Gastrointestinal: Positive for abdominal pain, nausea and vomiting. Negative for anorexia, constipation, diarrhea, flatus, hematochezia and melena.  Genitourinary: Negative for dysuria, frequency and hematuria.  Musculoskeletal: Negative for arthralgias and myalgias.  Neurological: Negative for headaches.  All other systems reviewed and are negative.    Physical Exam Updated Vital Signs BP (!) 159/102 (BP Location: Right Arm)   Pulse 100   Temp  98.8 F (37.1 C) (Oral)   Resp 18   LMP 08/28/2012   SpO2 100%   Physical Exam  Constitutional: She is oriented to person, place, and time. She appears well-developed and well-nourished. No distress.  HENT:  Head: Normocephalic and atraumatic.  Nose: Nose normal.  Mouth/Throat: No oropharyngeal exudate.  Eyes: Pupils are equal, round, and reactive to light. Conjunctivae are normal.  Neck: Normal range of motion. Neck supple.  Cardiovascular: Normal rate, regular rhythm, normal heart sounds and intact distal pulses.  Pulmonary/Chest: Effort normal and breath sounds normal. No stridor. She has no wheezes. She has no rales.  Abdominal: Soft. Bowel sounds are normal. She exhibits no mass. There is no tenderness. There is no rebound and no guarding. No hernia.  Musculoskeletal: Normal range of motion.  Neurological: She is alert and oriented to person, place, and time. She displays normal reflexes.  Skin: Skin is warm and dry. Capillary refill takes less than 2 seconds.  Psychiatric: She has a normal mood and affect.     ED Treatments / Results  Labs (all labs ordered are listed, but only abnormal results are displayed) Results for orders placed or performed during the hospital encounter of 05/28/17  Lipase, blood  Result Value Ref Range   Lipase 19 11 - 51 U/L  Comprehensive metabolic panel  Result Value Ref Range   Sodium 131 (L) 135 - 145 mmol/L   Potassium 3.5 3.5 - 5.1 mmol/L   Chloride 98 (L) 101 - 111 mmol/L   CO2 18 (L) 22 - 32 mmol/L   Glucose, Bld 104 (H) 65 - 99 mg/dL   BUN 15 6 - 20 mg/dL   Creatinine, Ser 0.91 0.44 - 1.00 mg/dL   Calcium 9.4 8.9 - 10.3 mg/dL   Total Protein 8.1 6.5 - 8.1 g/dL   Albumin 4.2 3.5 - 5.0 g/dL   AST 30 15 - 41 U/L   ALT 23 14 - 54 U/L   Alkaline Phosphatase 119 38 - 126 U/L   Total Bilirubin 1.2 0.3 - 1.2 mg/dL   GFR calc non Af Amer >60 >60 mL/min   GFR calc Af Amer >60 >60 mL/min   Anion gap 15 5 - 15  CBC  Result Value Ref  Range   WBC 14.4 (  H) 4.0 - 10.5 K/uL   RBC 4.79 3.87 - 5.11 MIL/uL   Hemoglobin 14.6 12.0 - 15.0 g/dL   HCT 42.3 36.0 - 46.0 %   MCV 88.3 78.0 - 100.0 fL   MCH 30.5 26.0 - 34.0 pg   MCHC 34.5 30.0 - 36.0 g/dL   RDW 14.0 11.5 - 15.5 %   Platelets 314 150 - 400 K/uL  Urinalysis, Routine w reflex microscopic  Result Value Ref Range   Color, Urine YELLOW YELLOW   APPearance CLOUDY (A) CLEAR   Specific Gravity, Urine 1.019 1.005 - 1.030   pH 6.0 5.0 - 8.0   Glucose, UA NEGATIVE NEGATIVE mg/dL   Hgb urine dipstick SMALL (A) NEGATIVE   Bilirubin Urine NEGATIVE NEGATIVE   Ketones, ur 20 (A) NEGATIVE mg/dL   Protein, ur 30 (A) NEGATIVE mg/dL   Nitrite NEGATIVE NEGATIVE   Leukocytes, UA NEGATIVE NEGATIVE   RBC / HPF 0-5 0 - 5 RBC/hpf   WBC, UA 6-30 0 - 5 WBC/hpf   Bacteria, UA RARE (A) NONE SEEN   Squamous Epithelial / LPF 6-30 (A) NONE SEEN  I-Stat beta hCG blood, ED  Result Value Ref Range   I-stat hCG, quantitative 12.9 (H) <5 mIU/mL   Comment 3           Dg Abd Acute W/chest  Result Date: 05/29/2017 CLINICAL DATA:  57 y/o F; abdominal pain and started on Friday with nausea and vomiting. Pain localized to the epigastric region. EXAM: DG ABDOMEN ACUTE W/ 1V CHEST COMPARISON:  09/19/2016 CT abdomen and pelvis. FINDINGS: There is no evidence of dilated bowel loops or free intraperitoneal air. No radiopaque calculi or other significant radiographic abnormality is seen. Heart size and mediastinal contours are within normal limits. Both lungs are clear. L2-L5 lumbar fusion hardware noted. IMPRESSION: Negative abdominal radiographs.  No acute cardiopulmonary disease. Electronically Signed   By: Kristine Garbe M.D.   On: 05/29/2017 04:20    EKG EKG Interpretation  Date/Time:  Sunday Alain Deschene 07 2019 22:25:36 EDT Ventricular Rate:  99 PR Interval:  140 QRS Duration: 72 QT Interval:  382 QTC Calculation: 490 R Axis:   14 Text Interpretation:  Normal sinus rhythm Confirmed by  Randal Buba, Calissa Swenor ([id number]) on 05/29/2017 2:48:12 AM   Radiology Dg Abd Acute W/chest  Result Date: 05/29/2017 CLINICAL DATA:  57 y/o F; abdominal pain and started on Friday with nausea and vomiting. Pain localized to the epigastric region. EXAM: DG ABDOMEN ACUTE W/ 1V CHEST COMPARISON:  09/19/2016 CT abdomen and pelvis. FINDINGS: There is no evidence of dilated bowel loops or free intraperitoneal air. No radiopaque calculi or other significant radiographic abnormality is seen. Heart size and mediastinal contours are within normal limits. Both lungs are clear. L2-L5 lumbar fusion hardware noted. IMPRESSION: Negative abdominal radiographs.  No acute cardiopulmonary disease. Electronically Signed   By: Kristine Garbe M.D.   On: 05/29/2017 04:20    Procedures Procedures (including critical care time)  Medications Ordered in ED Medications  sodium chloride 0.9 % bolus 1,000 mL (has no administration in time range)  ketorolac (TORADOL) 30 MG/ML injection 30 mg (has no administration in time range)  capsaicin (ZOSTRIX) 0.025 % cream (has no administration in time range)  sterile water (preservative free) injection (has no administration in time range)  ondansetron (ZOFRAN-ODT) disintegrating tablet 4 mg (4 mg Oral Given 05/28/17 2226)  OLANZapine (ZYPREXA) injection 5 mg (5 mg Intramuscular Given 05/29/17 0359)  gi cocktail (Maalox,Lidocaine,Donnatal) (30 mLs Oral Given  05/29/17 0358)      Final Clinical Impressions(s) / ED Diagnoses  Resting comfortably post medication.  This is classically marijuana induced cyclic vomiting.  Nurse reported to me patient would like a script for suboxone.  She must call her pain provider for this.  [place] checked she has multiple RX for suboxone and xanax and valium.    Return for weakness, numbness, changes in vision or speech, fevers >100.4 unrelieved by medication, shortness of breath, intractable vomiting, or diarrhea, abdominal pain, Inability to  tolerate liquids or food, cough, altered mental status or any concerns. No signs of systemic illness or infection. The patient is nontoxic-appearing on exam and vital signs are within normal limits.   I have reviewed the triage vital signs and the nursing notes. Pertinent labs &imaging results that were available during my care of the patient were reviewed by me and considered in my medical decision making (see chart for details).  After history, exam, and medical workup I feel the patient has been appropriately medically screened and is safe for discharge home. Pertinent diagnoses were discussed with the patient. Patient was given return precautions.    Kalah Pflum, MD 05/29/17 [phone]

## 2017-06-13 DIAGNOSIS — Z79891 Long term (current) use of opiate analgesic: Secondary | ICD-10-CM | POA: Diagnosis not present

## 2017-06-27 DIAGNOSIS — Z79891 Long term (current) use of opiate analgesic: Secondary | ICD-10-CM | POA: Diagnosis not present

## 2017-07-11 DIAGNOSIS — Z79891 Long term (current) use of opiate analgesic: Secondary | ICD-10-CM | POA: Diagnosis not present

## 2017-07-24 DIAGNOSIS — I10 Essential (primary) hypertension: Secondary | ICD-10-CM | POA: Diagnosis not present

## 2017-07-24 DIAGNOSIS — G5600 Carpal tunnel syndrome, unspecified upper limb: Secondary | ICD-10-CM | POA: Diagnosis not present

## 2017-07-25 DIAGNOSIS — Z79891 Long term (current) use of opiate analgesic: Secondary | ICD-10-CM | POA: Diagnosis not present

## 2017-08-09 DIAGNOSIS — Z79891 Long term (current) use of opiate analgesic: Secondary | ICD-10-CM | POA: Diagnosis not present

## 2017-09-11 DIAGNOSIS — Z79891 Long term (current) use of opiate analgesic: Secondary | ICD-10-CM | POA: Diagnosis not present

## 2017-09-22 DIAGNOSIS — J449 Chronic obstructive pulmonary disease, unspecified: Secondary | ICD-10-CM | POA: Diagnosis not present

## 2017-09-22 DIAGNOSIS — I10 Essential (primary) hypertension: Secondary | ICD-10-CM | POA: Diagnosis not present

## 2017-09-25 DIAGNOSIS — Z79891 Long term (current) use of opiate analgesic: Secondary | ICD-10-CM | POA: Diagnosis not present

## 2017-10-09 DIAGNOSIS — Z79891 Long term (current) use of opiate analgesic: Secondary | ICD-10-CM | POA: Diagnosis not present

## 2017-10-20 DIAGNOSIS — Z79891 Long term (current) use of opiate analgesic: Secondary | ICD-10-CM | POA: Diagnosis not present

## 2017-10-30 IMAGING — CT CT ABD-PELV W/ CM
2 of 5 series · 17 of 46 positions shown, 19 images · IV contrast (APPLIED)
Comparison: 03/04/2016 and prior CTs

CLINICAL DATA: 55-year-old female with diffuse abdominal and pelvic
pain for several days.

EXAM:
CT ABDOMEN AND PELVIS WITH CONTRAST
TECHNIQUE: Multidetector CT imaging of the abdomen and pelvis was performed
using the standard protocol following bolus administration of
intravenous contrast.
CONTRAST:  100mL MQPVG3-AEE IOPAMIDOL (MQPVG3-AEE) INJECTION 61%

[Series 3: abd/ pelvis 5.0 i30f 2 · axial · 0.88mm/px · z∈[+805,+1205]mm · 14 of 92 slices shown, 16 images]
[im 6/92  soft-tissue]
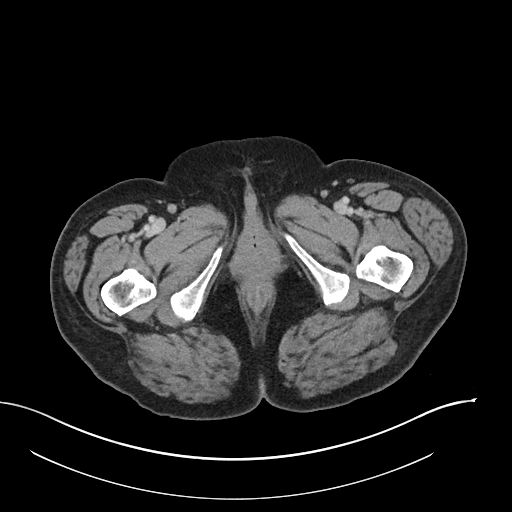
[im 6/92  bone]
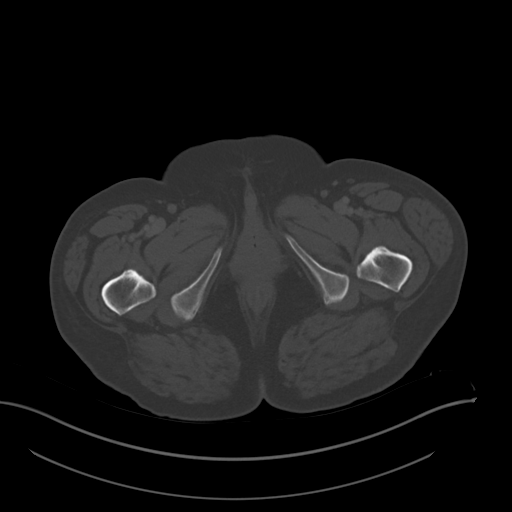
[im 11/92  soft-tissue]
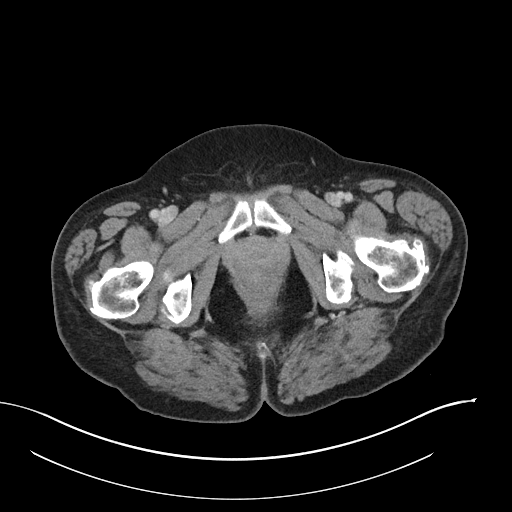
[im 17/92  soft-tissue]
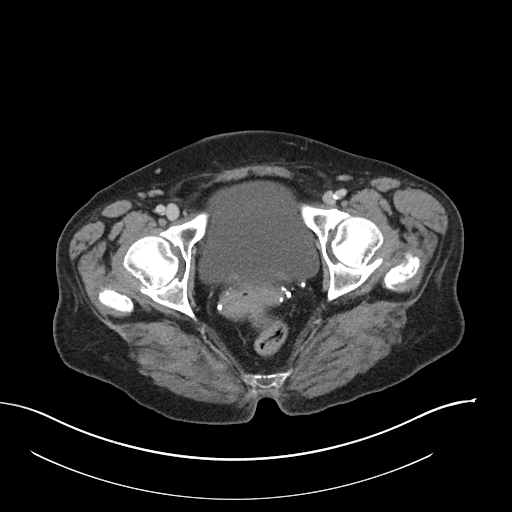
[im 27/92  soft-tissue]
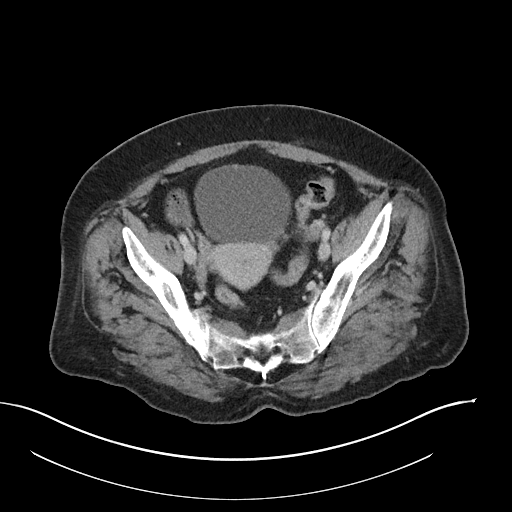
[im 33/92  soft-tissue]
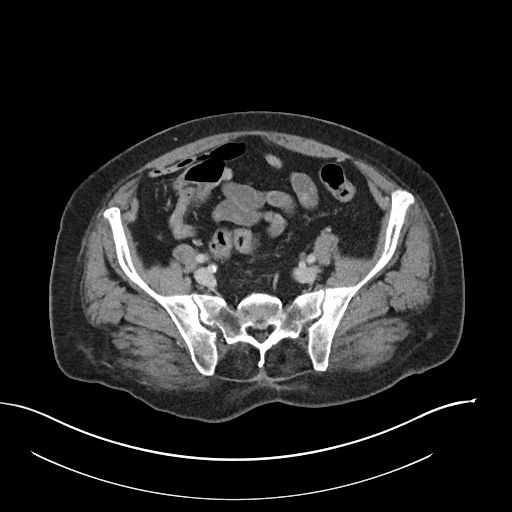
[im 38/92  soft-tissue]
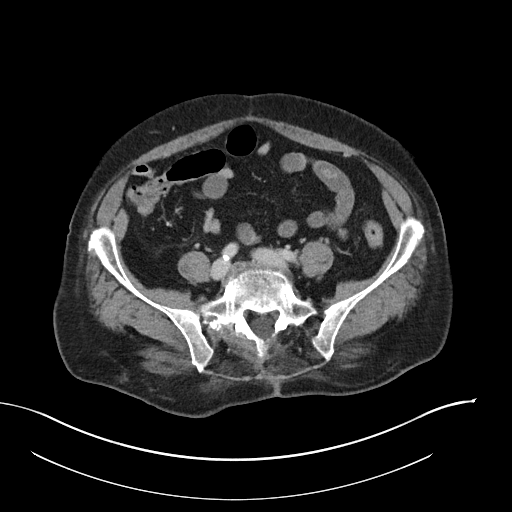
[im 43/92  soft-tissue]
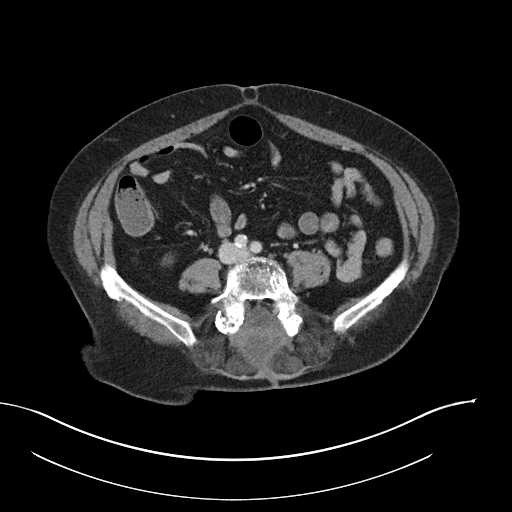
[im 49/92  soft-tissue]
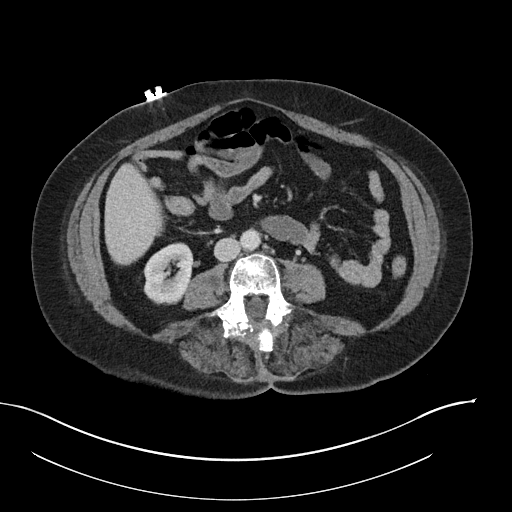
[im 54/92  soft-tissue]
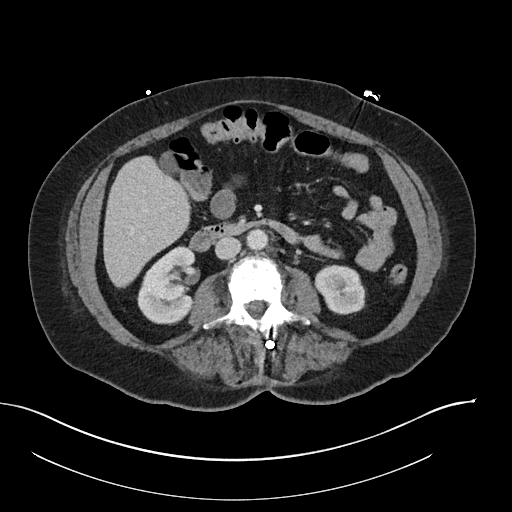
[im 54/92  bone]
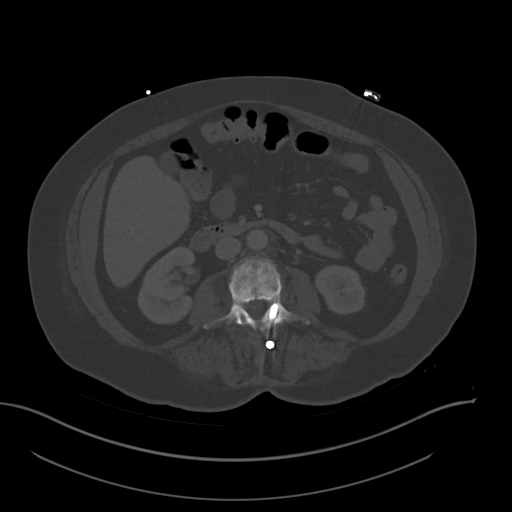
[im 59/92  soft-tissue]
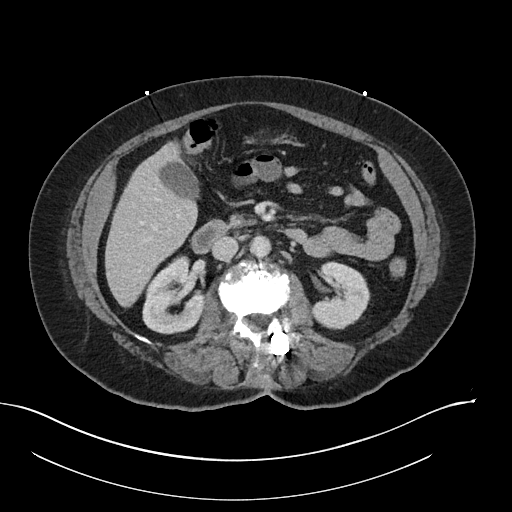
[im 70/92  soft-tissue]
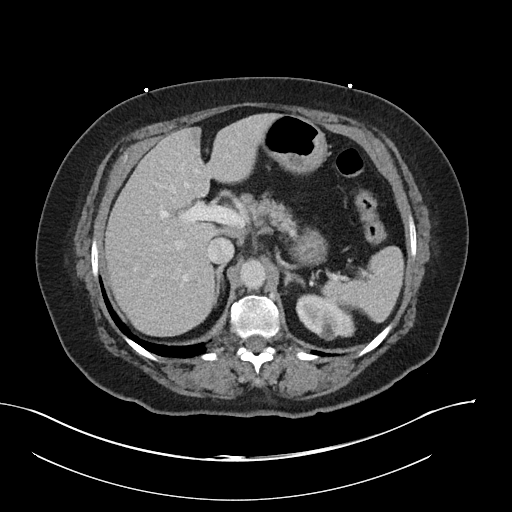
[im 75/92  soft-tissue]
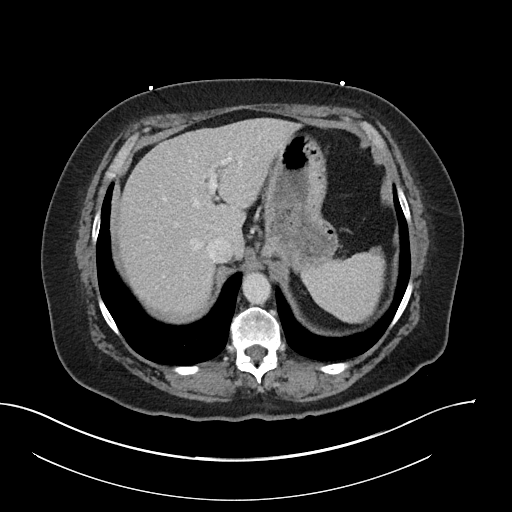
[im 81/92  soft-tissue]
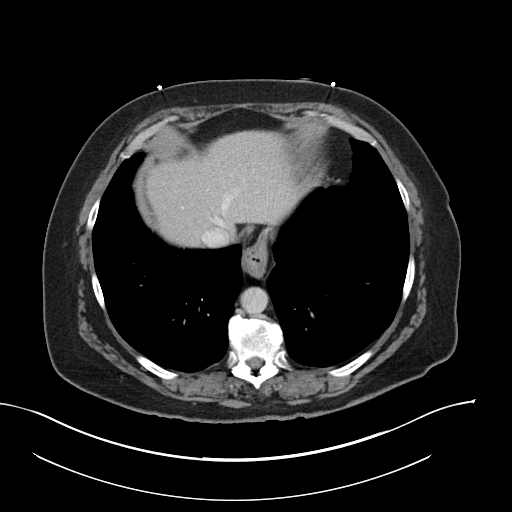
[im 86/92  soft-tissue]
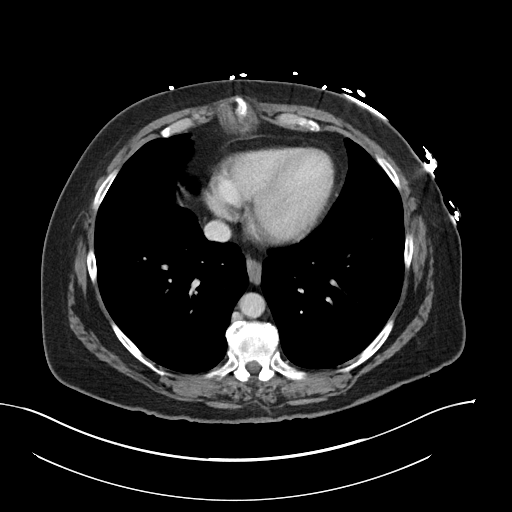

[Series 6: coronal soft tissue · coronal · 0.80mm/px · 3 of 94 slices shown]
[im 32/94  soft-tissue]
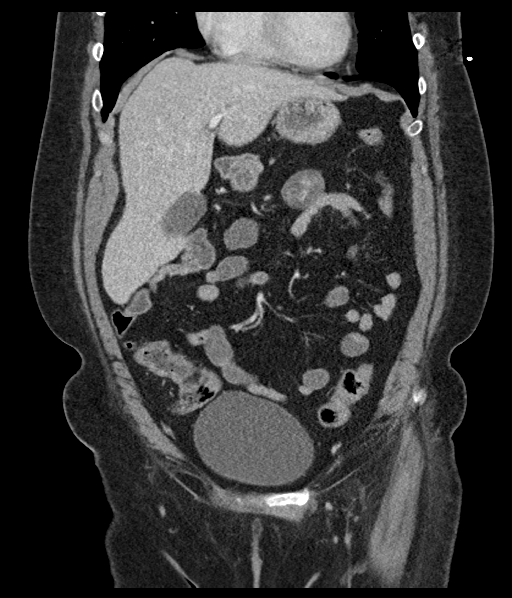
[im 42/94  soft-tissue]
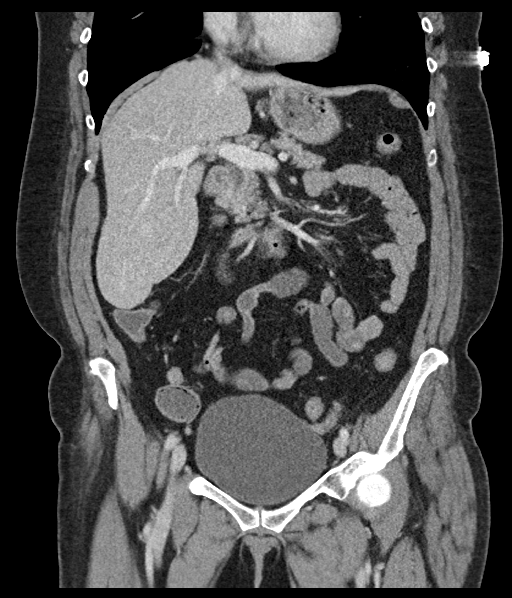
[im 52/94  soft-tissue]
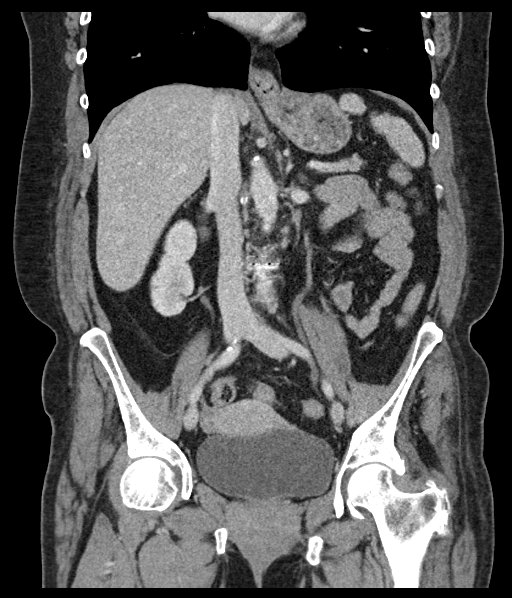

[17 of 46 positions shown; findings below may reference images not displayed]

FINDINGS: Lower chest: No acute abnormality.

Hepatobiliary: The liver and gallbladder are unremarkable. No
biliary dilatation.

Pancreas: Unremarkable

Spleen: Unremarkable

Adrenals/Urinary Tract: The kidneys, adrenal glands and bladder are
unremarkable except for a stable left renal cyst.

Stomach/Bowel: Small hiatal hernia is noted. There is no evidence of
bowel obstruction, bowel wall thickening or pneumoperitoneum.

Vascular/Lymphatic: No significant vascular findings are present. No
enlarged abdominal or pelvic lymph nodes.

Reproductive: Uterus and bilateral adnexa are unremarkable.

Other: No ascites or focal collection.

Musculoskeletal: Surgical changes within the lumbar spine again
noted. No acute bony abnormality or suspicious focal lesion.
IMPRESSION: No acute abnormality.

Small hiatal hernia.

## 2017-11-06 DIAGNOSIS — Z79891 Long term (current) use of opiate analgesic: Secondary | ICD-10-CM | POA: Diagnosis not present

## 2017-11-20 DIAGNOSIS — Z79891 Long term (current) use of opiate analgesic: Secondary | ICD-10-CM | POA: Diagnosis not present

## 2017-11-21 ENCOUNTER — Ambulatory Visit (INDEPENDENT_AMBULATORY_CARE_PROVIDER_SITE_OTHER): Payer: Medicare Other | Admitting: Family Medicine

## 2017-11-24 ENCOUNTER — Ambulatory Visit (INDEPENDENT_AMBULATORY_CARE_PROVIDER_SITE_OTHER): Payer: Medicare Other | Admitting: Family Medicine

## 2017-12-05 DIAGNOSIS — Z23 Encounter for immunization: Secondary | ICD-10-CM | POA: Diagnosis not present

## 2017-12-05 DIAGNOSIS — Z79891 Long term (current) use of opiate analgesic: Secondary | ICD-10-CM | POA: Diagnosis not present

## 2017-12-19 DIAGNOSIS — Z79891 Long term (current) use of opiate analgesic: Secondary | ICD-10-CM | POA: Diagnosis not present

## 2017-12-25 DIAGNOSIS — Z79899 Other long term (current) drug therapy: Secondary | ICD-10-CM | POA: Diagnosis not present

## 2017-12-25 DIAGNOSIS — E785 Hyperlipidemia, unspecified: Secondary | ICD-10-CM | POA: Diagnosis not present

## 2017-12-25 DIAGNOSIS — J449 Chronic obstructive pulmonary disease, unspecified: Secondary | ICD-10-CM | POA: Diagnosis not present

## 2017-12-25 DIAGNOSIS — I10 Essential (primary) hypertension: Secondary | ICD-10-CM | POA: Diagnosis not present

## 2018-01-02 DIAGNOSIS — Z79891 Long term (current) use of opiate analgesic: Secondary | ICD-10-CM | POA: Diagnosis not present

## 2018-01-16 DIAGNOSIS — Z79891 Long term (current) use of opiate analgesic: Secondary | ICD-10-CM | POA: Diagnosis not present

## 2018-02-06 DIAGNOSIS — Z79891 Long term (current) use of opiate analgesic: Secondary | ICD-10-CM | POA: Diagnosis not present

## 2018-02-26 DIAGNOSIS — I10 Essential (primary) hypertension: Secondary | ICD-10-CM | POA: Diagnosis not present

## 2018-02-27 DIAGNOSIS — Z79891 Long term (current) use of opiate analgesic: Secondary | ICD-10-CM | POA: Diagnosis not present

## 2018-03-20 DIAGNOSIS — Z79891 Long term (current) use of opiate analgesic: Secondary | ICD-10-CM | POA: Diagnosis not present

## 2018-04-10 DIAGNOSIS — Z79891 Long term (current) use of opiate analgesic: Secondary | ICD-10-CM | POA: Diagnosis not present

## 2018-05-01 DIAGNOSIS — Z79891 Long term (current) use of opiate analgesic: Secondary | ICD-10-CM | POA: Diagnosis not present

## 2018-05-02 DIAGNOSIS — I10 Essential (primary) hypertension: Secondary | ICD-10-CM | POA: Diagnosis not present

## 2018-05-15 DIAGNOSIS — Z79891 Long term (current) use of opiate analgesic: Secondary | ICD-10-CM | POA: Diagnosis not present

## 2018-05-29 DIAGNOSIS — Z79891 Long term (current) use of opiate analgesic: Secondary | ICD-10-CM | POA: Diagnosis not present

## 2018-06-19 DIAGNOSIS — Z79891 Long term (current) use of opiate analgesic: Secondary | ICD-10-CM | POA: Diagnosis not present

## 2018-07-02 DIAGNOSIS — J449 Chronic obstructive pulmonary disease, unspecified: Secondary | ICD-10-CM | POA: Diagnosis not present

## 2018-07-02 DIAGNOSIS — I10 Essential (primary) hypertension: Secondary | ICD-10-CM | POA: Diagnosis not present

## 2018-07-03 DIAGNOSIS — Z79891 Long term (current) use of opiate analgesic: Secondary | ICD-10-CM | POA: Diagnosis not present

## 2018-07-17 DIAGNOSIS — Z79891 Long term (current) use of opiate analgesic: Secondary | ICD-10-CM | POA: Diagnosis not present

## 2018-07-30 DIAGNOSIS — Z79891 Long term (current) use of opiate analgesic: Secondary | ICD-10-CM | POA: Diagnosis not present

## 2018-08-13 DIAGNOSIS — Z79891 Long term (current) use of opiate analgesic: Secondary | ICD-10-CM | POA: Diagnosis not present

## 2018-08-20 DIAGNOSIS — Z79891 Long term (current) use of opiate analgesic: Secondary | ICD-10-CM | POA: Diagnosis not present

## 2018-09-03 DIAGNOSIS — J449 Chronic obstructive pulmonary disease, unspecified: Secondary | ICD-10-CM | POA: Diagnosis not present

## 2018-09-03 DIAGNOSIS — Z1231 Encounter for screening mammogram for malignant neoplasm of breast: Secondary | ICD-10-CM | POA: Diagnosis not present

## 2018-09-10 DIAGNOSIS — Z79891 Long term (current) use of opiate analgesic: Secondary | ICD-10-CM | POA: Diagnosis not present

## 2018-09-21 DIAGNOSIS — Z0000 Encounter for general adult medical examination without abnormal findings: Secondary | ICD-10-CM | POA: Diagnosis not present

## 2018-09-21 DIAGNOSIS — E785 Hyperlipidemia, unspecified: Secondary | ICD-10-CM | POA: Diagnosis not present

## 2018-09-21 DIAGNOSIS — Z9181 History of falling: Secondary | ICD-10-CM | POA: Diagnosis not present

## 2018-09-21 DIAGNOSIS — Z1231 Encounter for screening mammogram for malignant neoplasm of breast: Secondary | ICD-10-CM | POA: Diagnosis not present

## 2018-09-26 DIAGNOSIS — Z79891 Long term (current) use of opiate analgesic: Secondary | ICD-10-CM | POA: Diagnosis not present

## 2018-10-10 DIAGNOSIS — Z79891 Long term (current) use of opiate analgesic: Secondary | ICD-10-CM | POA: Diagnosis not present

## 2018-10-22 DIAGNOSIS — Z79891 Long term (current) use of opiate analgesic: Secondary | ICD-10-CM | POA: Diagnosis not present

## 2018-11-01 DIAGNOSIS — I10 Essential (primary) hypertension: Secondary | ICD-10-CM | POA: Diagnosis not present

## 2018-11-01 DIAGNOSIS — J449 Chronic obstructive pulmonary disease, unspecified: Secondary | ICD-10-CM | POA: Diagnosis not present

## 2018-11-05 DIAGNOSIS — Z79891 Long term (current) use of opiate analgesic: Secondary | ICD-10-CM | POA: Diagnosis not present

## 2018-11-26 DIAGNOSIS — Z79891 Long term (current) use of opiate analgesic: Secondary | ICD-10-CM | POA: Diagnosis not present

## 2018-12-10 DIAGNOSIS — Z23 Encounter for immunization: Secondary | ICD-10-CM | POA: Diagnosis not present

## 2018-12-10 DIAGNOSIS — Z79891 Long term (current) use of opiate analgesic: Secondary | ICD-10-CM | POA: Diagnosis not present

## 2018-12-24 DIAGNOSIS — Z79891 Long term (current) use of opiate analgesic: Secondary | ICD-10-CM | POA: Diagnosis not present

## 2019-01-01 DIAGNOSIS — J449 Chronic obstructive pulmonary disease, unspecified: Secondary | ICD-10-CM | POA: Diagnosis not present

## 2019-01-01 DIAGNOSIS — Z79899 Other long term (current) drug therapy: Secondary | ICD-10-CM | POA: Diagnosis not present

## 2019-01-01 DIAGNOSIS — E785 Hyperlipidemia, unspecified: Secondary | ICD-10-CM | POA: Diagnosis not present

## 2019-01-07 DIAGNOSIS — Z79891 Long term (current) use of opiate analgesic: Secondary | ICD-10-CM | POA: Diagnosis not present

## 2019-02-05 DIAGNOSIS — E875 Hyperkalemia: Secondary | ICD-10-CM | POA: Diagnosis not present

## 2019-02-05 DIAGNOSIS — Z79891 Long term (current) use of opiate analgesic: Secondary | ICD-10-CM | POA: Diagnosis not present

## 2019-02-25 DIAGNOSIS — Z79891 Long term (current) use of opiate analgesic: Secondary | ICD-10-CM | POA: Diagnosis not present

## 2019-02-25 DIAGNOSIS — Z87891 Personal history of nicotine dependence: Secondary | ICD-10-CM | POA: Diagnosis not present

## 2019-03-06 DIAGNOSIS — K219 Gastro-esophageal reflux disease without esophagitis: Secondary | ICD-10-CM | POA: Diagnosis not present

## 2019-03-06 DIAGNOSIS — J449 Chronic obstructive pulmonary disease, unspecified: Secondary | ICD-10-CM | POA: Diagnosis not present

## 2019-03-06 DIAGNOSIS — M25511 Pain in right shoulder: Secondary | ICD-10-CM | POA: Diagnosis not present

## 2019-03-14 DIAGNOSIS — Z79891 Long term (current) use of opiate analgesic: Secondary | ICD-10-CM | POA: Diagnosis not present

## 2019-04-09 DIAGNOSIS — Z79891 Long term (current) use of opiate analgesic: Secondary | ICD-10-CM | POA: Diagnosis not present

## 2019-04-25 DIAGNOSIS — Z79891 Long term (current) use of opiate analgesic: Secondary | ICD-10-CM | POA: Diagnosis not present

## 2019-05-03 DIAGNOSIS — E785 Hyperlipidemia, unspecified: Secondary | ICD-10-CM | POA: Diagnosis not present

## 2019-05-03 DIAGNOSIS — J449 Chronic obstructive pulmonary disease, unspecified: Secondary | ICD-10-CM | POA: Diagnosis not present

## 2019-05-03 DIAGNOSIS — I10 Essential (primary) hypertension: Secondary | ICD-10-CM | POA: Diagnosis not present

## 2019-05-03 DIAGNOSIS — K219 Gastro-esophageal reflux disease without esophagitis: Secondary | ICD-10-CM | POA: Diagnosis not present

## 2019-05-03 DIAGNOSIS — Z79899 Other long term (current) drug therapy: Secondary | ICD-10-CM | POA: Diagnosis not present

## 2019-05-16 DIAGNOSIS — Z79891 Long term (current) use of opiate analgesic: Secondary | ICD-10-CM | POA: Diagnosis not present

## 2019-06-18 DIAGNOSIS — Z79891 Long term (current) use of opiate analgesic: Secondary | ICD-10-CM | POA: Diagnosis not present

## 2019-06-28 DIAGNOSIS — K219 Gastro-esophageal reflux disease without esophagitis: Secondary | ICD-10-CM | POA: Diagnosis not present

## 2019-06-28 DIAGNOSIS — R112 Nausea with vomiting, unspecified: Secondary | ICD-10-CM | POA: Diagnosis not present

## 2019-06-28 DIAGNOSIS — I10 Essential (primary) hypertension: Secondary | ICD-10-CM | POA: Diagnosis not present

## 2019-06-28 DIAGNOSIS — J449 Chronic obstructive pulmonary disease, unspecified: Secondary | ICD-10-CM | POA: Diagnosis not present

## 2019-07-09 DIAGNOSIS — Z79891 Long term (current) use of opiate analgesic: Secondary | ICD-10-CM | POA: Diagnosis not present

## 2019-07-24 DIAGNOSIS — Z79891 Long term (current) use of opiate analgesic: Secondary | ICD-10-CM | POA: Diagnosis not present

## 2019-08-05 ENCOUNTER — Encounter: Payer: Self-pay | Admitting: Physician Assistant

## 2019-08-05 ENCOUNTER — Other Ambulatory Visit: Payer: Self-pay

## 2019-08-05 ENCOUNTER — Ambulatory Visit (INDEPENDENT_AMBULATORY_CARE_PROVIDER_SITE_OTHER): Payer: Medicare Other | Admitting: Orthopedic Surgery

## 2019-08-05 ENCOUNTER — Ambulatory Visit: Payer: Self-pay

## 2019-08-05 VITALS — Ht 67.0 in | Wt 190.0 lb

## 2019-08-05 DIAGNOSIS — M25512 Pain in left shoulder: Secondary | ICD-10-CM | POA: Diagnosis not present

## 2019-08-05 DIAGNOSIS — G8929 Other chronic pain: Secondary | ICD-10-CM | POA: Diagnosis not present

## 2019-08-05 DIAGNOSIS — M25511 Pain in right shoulder: Secondary | ICD-10-CM | POA: Diagnosis not present

## 2019-08-05 DIAGNOSIS — M25559 Pain in unspecified hip: Secondary | ICD-10-CM | POA: Diagnosis not present

## 2019-08-05 MED ORDER — CYCLOBENZAPRINE HCL 10 MG PO TABS: 10.0000 mg | ORAL_TABLET | Freq: Three times a day (TID) | ORAL | 0 refills | Status: DC | PRN

## 2019-08-05 MED ORDER — IBUPROFEN 800 MG PO TABS: 800.0000 mg | ORAL_TABLET | Freq: Three times a day (TID) | ORAL | 0 refills | Status: DC | PRN

## 2019-08-05 NOTE — Progress Notes (Signed)
Office Visit Note   Patient: Madeline Dickerson           Date of Birth: [birth year]           MRN: [id number] Visit Date: 08/05/2019              Requested by: Helen Hashimoto., MD [address] [place],  [place] [address] PCP: Helen Hashimoto., MD  Chief Complaint  Patient presents with  . Left Shoulder - Pain, Follow-up  . Right Shoulder - Pain, Follow-up  . Left Hip - Pain      HPI: Patient is a 59 year old woman who presents complaining of chronic bilateral shoulder pain pain with elevation pain with reaching behind herself.  Patient states she is also started developed some left hip and groin pain.  Patient states that Flexeril has helped with her generalized pain in the past.  She states the right shoulder is worse than her left shoulder.  Assessment & Plan: Visit Diagnoses:  1. Hip pain   2. Chronic pain of both shoulders     Plan: Both shoulders were injected in subacromial space she tolerated this well.  A refill prescription was provided for Flexeril as well as ibuprofen.  Follow-Up Instructions: Return if symptoms worsen or fail to improve.   Ortho Exam  Patient is alert, oriented, no adenopathy, well-dressed, normal affect, normal respiratory effort. Examination patient has abduction flexion of both shoulders to 90 degrees she has pain with Neer and Hawkins impingement test bilaterally she is tender to palpation of the biceps tendon of both shoulders.  Examination of the left hip she has internal rotation of 20 degrees external rotation of 45 degrees she has mild pain with range of motion has no abductor lurch.  Imaging: No results found. No images are attached to the encounter.  Labs: Lab Results  Component Value Date   HGBA1C 6.0 (H) 12/26/2015   HGBA1C 6.0 04/01/2011   REPTSTATUS 12/30/2015 FINAL 12/27/2015   GRAMSTAIN  12/27/2015    MODERATE WBC PRESENT,BOTH PMN AND MONONUCLEAR RARE SQUAMOUS EPITHELIAL CELLS PRESENT FEW GRAM  POSITIVE COCCI IN PAIRS RARE BUDDING YEAST SEEN    CULT  12/27/2015    Consistent with normal respiratory flora. Performed at [facility] 04/30/2015     Lab Results  Component Value Date   ALBUMIN 4.2 05/28/2017   ALBUMIN 4.1 11/29/2016   ALBUMIN 4.4 09/19/2016    Lab Results  Component Value Date   MG 1.4 (L) 09/19/2016   MG 1.6 (L) 06/17/2016   MG 1.6 (L) 12/26/2015   No results found for: VD25OH  No results found for: PREALBUMIN CBC EXTENDED Latest Ref Rng & Units 05/28/2017 11/29/2016 09/19/2016  WBC 4.0 - 10.5 K/uL 14.4(H) 15.3(H) 15.4(H)  RBC 3.87 - 5.11 MIL/uL 4.79 4.02 4.63  HGB 12.0 - 15.0 g/dL 14.6 12.1 13.6  HCT 36 - 46 % 42.3 35.7(L) 38.4  PLT 150 - 400 K/uL 314 397 392  NEUTROABS 1.7 - 7.7 K/uL - 12.8(H) -  LYMPHSABS 0.7 - 4.0 K/uL - 1.4 -     Body mass index is 29.76 kg/m.  Orders:  Orders Placed This Encounter  Procedures  . XR HIP UNILAT W OR W/O PELVIS 2-3 VIEWS LEFT   Meds ordered this encounter  Medications  . cyclobenzaprine (FLEXERIL) 10 MG tablet    Sig: Take 1 tablet (10 mg total) by mouth 3 (three) times daily as needed for  muscle spasms.    Dispense:  30 tablet    Refill:  0  . ibuprofen (ADVIL) 800 MG tablet    Sig: Take 1 tablet (800 mg total) by mouth every 8 (eight) hours as needed.    Dispense:  30 tablet    Refill:  0     Procedures: Large Joint Inj: bilateral subacromial bursa on 08/06/2019 4:22 PM Indications: diagnostic evaluation and pain Details: 22 G 1.5 in needle, posterior approach  Arthrogram: No  Outcome: tolerated well, no immediate complications Procedure, treatment alternatives, risks and benefits explained, specific risks discussed. Consent was given by the patient. Immediately prior to procedure a time out was called to verify the correct patient, procedure, equipment, support staff and site/side marked as required. Patient was prepped and draped in the usual sterile fashion.       Clinical Data: No additional findings.  ROS:  All other systems negative, except as noted in the HPI. Review of Systems  Objective: Vital Signs: Ht 5\' 7"  (1.702 m)   Wt 190 lb (86.2 kg)   LMP 08/28/2012   BMI 29.76 kg/m   Specialty Comments:  No specialty comments available.  PMFS History: Patient Active Problem List   Diagnosis Date Noted  . Impingement syndrome of right shoulder 12/01/2016  . Impingement syndrome of left shoulder 12/01/2016  . HNP (herniated nucleus pulposus), lumbar 05/05/2016  . Hyperkalemia 12/28/2015  . COPD exacerbation ([place]) 12/25/2015  . Anemia 12/25/2015  . Shortness of breath 12/25/2015  . Cardiomegaly 12/25/2015  . Bipolar 1 disorder, mixed, moderate ([place]) 06/03/2015  . CAP (community acquired pneumonia) 04/30/2015  . Pneumonitis 04/30/2015  . Status asthmaticus 04/30/2015  . Acute respiratory failure with hypoxia ([place]) 04/30/2015  . Dyspnea 04/30/2015  . Hyponatremia, severe 04/30/2015  . Lumbar spondylosis 04/27/2011  . Screening for colon cancer 01/17/2011  . Abnormal EKG 01/17/2011  . Tobacco abuse 01/17/2011  . Tobacco abuse counseling 01/17/2011  . Hypertension   . Chronic low back pain    Past Medical History:  Diagnosis Date  . Anxiety    hx  . Arthritis    "lower back, hands" (05/01/2015)  . Chronic low back pain    disk s/p 4 diskectomies, 5th fursion, then spinal cord stimulator ([place])  . Depression   . Dysrhythmia    irruglar heart beat "nothing wrong"   . Fibromyalgia   . GERD (gastroesophageal reflux disease)   . Hypertension   . Pneumonia 05/01/2015  . Rotator cuff disorder   . Seasonal asthma    "seasonal"  . Shortness of breath dyspnea   . Walking pneumonia     Family History  Problem Relation Age of Onset  . Stroke Father   . Hypertension Father   . Lung cancer Father     Past Surgical History:  Procedure Laterality Date  . BACK SURGERY    . CESAREAN SECTION  1986  . LUMBAR  MICRODISCECTOMY  01/1990; 2003  . LUMBAR SPINE SURGERY  03/1991   "cleaned up scar tissue"  . POSTERIOR LUMBAR FUSION  2011; 04/27/2011  . REPAIR DURAL / CSF LEAK  02/1990  . SPINAL CORD STIMULATOR IMPLANT  2012  . SPINAL CORD STIMULATOR REMOVAL  08/2010  . TUBAL LIGATION  1986   Social History   Occupational History  . Not on file  Tobacco Use  . Smoking status: Current Every Day Smoker    Packs/day: 1.00    Years: 42.00    Pack years: 42.00  Types: Cigarettes  . Smokeless tobacco: Never Used  . Tobacco comment: prior trial of zyban made her feel weird, pt doesnt want info right now  Substance and Sexual Activity  . Alcohol use: No  . Drug use: Yes    Types: Marijuana    Comment: 05/01/2015 "I've used lots of multiple street drugs; nothing in the past couple years", "i use weed once a month"  . Sexual activity: Yes

## 2019-08-06 ENCOUNTER — Encounter: Payer: Self-pay | Admitting: Orthopedic Surgery

## 2019-08-06 DIAGNOSIS — M25511 Pain in right shoulder: Secondary | ICD-10-CM

## 2019-08-06 DIAGNOSIS — G8929 Other chronic pain: Secondary | ICD-10-CM | POA: Diagnosis not present

## 2019-08-07 DIAGNOSIS — Z79891 Long term (current) use of opiate analgesic: Secondary | ICD-10-CM | POA: Diagnosis not present

## 2019-08-07 DIAGNOSIS — G4700 Insomnia, unspecified: Secondary | ICD-10-CM | POA: Diagnosis not present

## 2019-08-21 DIAGNOSIS — Z79891 Long term (current) use of opiate analgesic: Secondary | ICD-10-CM | POA: Diagnosis not present

## 2019-08-21 DIAGNOSIS — G4700 Insomnia, unspecified: Secondary | ICD-10-CM | POA: Diagnosis not present

## 2019-08-29 DIAGNOSIS — I10 Essential (primary) hypertension: Secondary | ICD-10-CM | POA: Diagnosis not present

## 2019-08-29 DIAGNOSIS — E785 Hyperlipidemia, unspecified: Secondary | ICD-10-CM | POA: Diagnosis not present

## 2019-08-29 DIAGNOSIS — G4700 Insomnia, unspecified: Secondary | ICD-10-CM | POA: Diagnosis not present

## 2019-08-29 DIAGNOSIS — Z79899 Other long term (current) drug therapy: Secondary | ICD-10-CM | POA: Diagnosis not present

## 2019-09-09 ENCOUNTER — Other Ambulatory Visit: Payer: Self-pay

## 2019-09-09 ENCOUNTER — Ambulatory Visit (INDEPENDENT_AMBULATORY_CARE_PROVIDER_SITE_OTHER): Payer: Medicare Other | Admitting: Physician Assistant

## 2019-09-09 ENCOUNTER — Encounter: Payer: Self-pay | Admitting: Orthopedic Surgery

## 2019-09-09 VITALS — Ht 67.0 in | Wt 190.0 lb

## 2019-09-09 DIAGNOSIS — M25511 Pain in right shoulder: Secondary | ICD-10-CM

## 2019-09-09 MED ORDER — METHYLPREDNISOLONE ACETATE 40 MG/ML IJ SUSP
40.0000 mg | INTRAMUSCULAR | Status: AC | PRN
  Administered 2019-09-09: 40 mg via INTRA_ARTICULAR

## 2019-09-09 MED ORDER — LIDOCAINE HCL 1 % IJ SOLN
5.0000 mL | INTRAMUSCULAR | Status: AC | PRN
  Administered 2019-09-09: 5 mL

## 2019-09-09 NOTE — Progress Notes (Signed)
Office Visit Note   Patient: Madeline Dickerson           Date of Birth: 26-Jun-1960           MRN: [id number] Visit Date: 09/09/2019              Requested by: Helen Hashimoto., MD [address] [place],  [place] [address] PCP: Helen Hashimoto., MD  Chief Complaint  Patient presents with  . Right Shoulder - Pain      HPI: This is a pleasant 59 year old woman with a chief complaint of right shoulder pain.  She has had a history of bilateral shoulder pain and at her last visit about a month ago received bilateral shoulder injections.  While the one on the left seems to be working the one on the right only gave her a few days relief and then her pain returned.  She would like to have 1 more injection on the right if possible  Assessment & Plan: Visit Diagnoses: No diagnosis found.  Plan: I have told her to give this about 10 days to see if it helps.  If she continues to have shoulder pain I would recommend an MRI.  She has failed conservative treatment including anti-inflammatories as well as 2 injections.  Follow-Up Instructions: No follow-ups on file.   Ortho Exam  Patient is alert, oriented, no adenopathy, well-dressed, normal affect, normal respiratory effort. Right shoulder she has pain with abduction and can abduct only to about 160 degrees secondary to pain she has difficulty even getting her hand behind her back but is able to do that at the belt line.  Range of motion is mostly limited by pain no radicular symptoms  Imaging: No results found. No images are attached to the encounter.  Labs: Lab Results  Component Value Date   HGBA1C 6.0 (H) 12/26/2015   HGBA1C 6.0 04/01/2011   REPTSTATUS 12/30/2015 FINAL 12/27/2015   GRAMSTAIN  12/27/2015    MODERATE WBC PRESENT,BOTH PMN AND MONONUCLEAR RARE SQUAMOUS EPITHELIAL CELLS PRESENT FEW GRAM POSITIVE COCCI IN PAIRS RARE BUDDING YEAST SEEN    CULT  12/27/2015    Consistent with normal respiratory  flora. Performed at [facility] 04/30/2015     Lab Results  Component Value Date   ALBUMIN 4.2 05/28/2017   ALBUMIN 4.1 11/29/2016   ALBUMIN 4.4 09/19/2016    Lab Results  Component Value Date   MG 1.4 (L) 09/19/2016   MG 1.6 (L) 06/17/2016   MG 1.6 (L) 12/26/2015   No results found for: VD25OH  No results found for: PREALBUMIN CBC EXTENDED Latest Ref Rng & Units 05/28/2017 11/29/2016 09/19/2016  WBC 4.0 - 10.5 K/uL 14.4(H) 15.3(H) 15.4(H)  RBC 3.87 - 5.11 MIL/uL 4.79 4.02 4.63  HGB 12.0 - 15.0 g/dL 14.6 12.1 13.6  HCT 36 - 46 % 42.3 35.7(L) 38.4  PLT 150 - 400 K/uL 314 397 392  NEUTROABS 1.7 - 7.7 K/uL - 12.8(H) -  LYMPHSABS 0.7 - 4.0 K/uL - 1.4 -     Body mass index is 29.76 kg/m.  Orders:  No orders of the defined types were placed in this encounter.  No orders of the defined types were placed in this encounter.    Procedures: Large Joint Inj: R subacromial bursa on 09/09/2019 9:38 AM Indications: diagnostic evaluation and pain Details: 22 G 1.5 in needle, posterior approach  Arthrogram: No  Medications: 5 mL lidocaine 1 %;  40 mg methylPREDNISolone acetate 40 MG/ML Outcome: tolerated well, no immediate complications Procedure, treatment alternatives, risks and benefits explained, specific risks discussed. Consent was given by the patient.      Clinical Data: No additional findings.  ROS:  All other systems negative, except as noted in the HPI. Review of Systems  Objective: Vital Signs: Ht 5\' 7"  (1.702 m)   Wt 190 lb (86.2 kg)   LMP 08/28/2012   BMI 29.76 kg/m   Specialty Comments:  No specialty comments available.  PMFS History: Patient Active Problem List   Diagnosis Date Noted  . Impingement syndrome of right shoulder 12/01/2016  . Impingement syndrome of left shoulder 12/01/2016  . HNP (herniated nucleus pulposus), lumbar 05/05/2016  . Hyperkalemia 12/28/2015  . COPD exacerbation ([place]) 12/25/2015   . Anemia 12/25/2015  . Shortness of breath 12/25/2015  . Cardiomegaly 12/25/2015  . Bipolar 1 disorder, mixed, moderate ([place]) 06/03/2015  . CAP (community acquired pneumonia) 04/30/2015  . Pneumonitis 04/30/2015  . Status asthmaticus 04/30/2015  . Acute respiratory failure with hypoxia ([place]) 04/30/2015  . Dyspnea 04/30/2015  . Hyponatremia, severe 04/30/2015  . Lumbar spondylosis 04/27/2011  . Screening for colon cancer 01/17/2011  . Abnormal EKG 01/17/2011  . Tobacco abuse 01/17/2011  . Tobacco abuse counseling 01/17/2011  . Hypertension   . Chronic low back pain    Past Medical History:  Diagnosis Date  . Anxiety    hx  . Arthritis    "lower back, hands" (05/01/2015)  . Chronic low back pain    disk s/p 4 diskectomies, 5th fursion, then spinal cord stimulator ([place])  . Depression   . Dysrhythmia    irruglar heart beat "nothing wrong"   . Fibromyalgia   . GERD (gastroesophageal reflux disease)   . Hypertension   . Pneumonia 05/01/2015  . Rotator cuff disorder   . Seasonal asthma    "seasonal"  . Shortness of breath dyspnea   . Walking pneumonia     Family History  Problem Relation Age of Onset  . Stroke Father   . Hypertension Father   . Lung cancer Father     Past Surgical History:  Procedure Laterality Date  . BACK SURGERY    . CESAREAN SECTION  1986  . LUMBAR MICRODISCECTOMY  01/1990; 2003  . LUMBAR SPINE SURGERY  03/1991   "cleaned up scar tissue"  . POSTERIOR LUMBAR FUSION  2011; 04/27/2011  . REPAIR DURAL / CSF LEAK  02/1990  . SPINAL CORD STIMULATOR IMPLANT  2012  . SPINAL CORD STIMULATOR REMOVAL  08/2010  . TUBAL LIGATION  1986   Social History   Occupational History  . Not on file  Tobacco Use  . Smoking status: Current Every Day Smoker    Packs/day: 1.00    Years: 42.00    Pack years: 42.00    Types: Cigarettes  . Smokeless tobacco: Never Used  . Tobacco comment: prior trial of zyban made her feel weird, pt doesnt want info right now   Substance and Sexual Activity  . Alcohol use: No  . Drug use: Yes    Types: Marijuana    Comment: 05/01/2015 "I've used lots of multiple street drugs; nothing in the past couple years", "i use weed once a month"  . Sexual activity: Yes

## 2019-09-12 DIAGNOSIS — Z79891 Long term (current) use of opiate analgesic: Secondary | ICD-10-CM | POA: Diagnosis not present

## 2019-09-12 DIAGNOSIS — G4700 Insomnia, unspecified: Secondary | ICD-10-CM | POA: Diagnosis not present

## 2019-09-17 ENCOUNTER — Telehealth: Payer: Self-pay | Admitting: Physician Assistant

## 2019-09-17 ENCOUNTER — Other Ambulatory Visit: Payer: Self-pay

## 2019-09-17 DIAGNOSIS — M25511 Pain in right shoulder: Secondary | ICD-10-CM

## 2019-09-17 NOTE — Telephone Encounter (Signed)
Tried to order MRI and was unable to because the pt has not had an xray. Tried to call the number provided and a vm comes on for someone names Verdis Frederickson and the mail box is full. I tried to call another number listed in the pt's chart for [facility] and that is not a working number I left a message for her daughter Estill Bamberg to advise that I was trying to reach the pt and asked if she would pease have her return our call. I will hold this message and try again later.

## 2019-09-17 NOTE — Telephone Encounter (Signed)
Ok to proceed with MRI of shoulder. Pt is s/p injection 09/09/19 and states that it has not helped per your dictation this was the next step

## 2019-09-17 NOTE — Telephone Encounter (Signed)
Next step per note is an MRI, then follow up with Dr. Sharol Given

## 2019-09-17 NOTE — Telephone Encounter (Signed)
Patient called advised the injection did not work. Patient asked if she can be scheduled for Surgery?  The number to contact patient is [phone]

## 2019-09-25 ENCOUNTER — Ambulatory Visit (INDEPENDENT_AMBULATORY_CARE_PROVIDER_SITE_OTHER): Payer: Medicare Other | Admitting: Physician Assistant

## 2019-09-25 ENCOUNTER — Telehealth: Payer: Self-pay | Admitting: Physician Assistant

## 2019-09-25 ENCOUNTER — Ambulatory Visit: Payer: Self-pay

## 2019-09-25 ENCOUNTER — Encounter: Payer: Self-pay | Admitting: Physician Assistant

## 2019-09-25 ENCOUNTER — Other Ambulatory Visit: Payer: Self-pay | Admitting: Physician Assistant

## 2019-09-25 DIAGNOSIS — G8929 Other chronic pain: Secondary | ICD-10-CM | POA: Diagnosis not present

## 2019-09-25 DIAGNOSIS — Z9181 History of falling: Secondary | ICD-10-CM | POA: Diagnosis not present

## 2019-09-25 DIAGNOSIS — M25511 Pain in right shoulder: Secondary | ICD-10-CM

## 2019-09-25 DIAGNOSIS — Z0000 Encounter for general adult medical examination without abnormal findings: Secondary | ICD-10-CM | POA: Diagnosis not present

## 2019-09-25 DIAGNOSIS — E785 Hyperlipidemia, unspecified: Secondary | ICD-10-CM | POA: Diagnosis not present

## 2019-09-25 MED ORDER — PROMETHAZINE HCL 25 MG PO TABS: 25.0000 mg | ORAL_TABLET | Freq: Four times a day (QID) | ORAL | 0 refills | Status: DC | PRN

## 2019-09-25 MED ORDER — GABAPENTIN 300 MG PO CAPS: 300.0000 mg | ORAL_CAPSULE | Freq: Every day | ORAL | 3 refills | Status: DC

## 2019-09-25 NOTE — Telephone Encounter (Signed)
Patient called advised the pharmacy will not fill Rx because  the directions is incorrect to fill the Rx. Patient uses [facility]  Ph# [phone]  The number to contact patient is [phone]

## 2019-09-25 NOTE — Progress Notes (Signed)
Office Visit Note   Patient: Madeline Dickerson           Date of Birth: 1960-09-30           MRN: [id number] Visit Date: 09/25/2019              Requested by: Helen Hashimoto., MD [address] [place],  [place] [address] PCP: Helen Hashimoto., MD  Chief Complaint  Patient presents with  . Right Shoulder - Follow-up      HPI: This is a pleasant 59 year old woman with continuing right shoulder pain.  She last had an injection a few weeks ago.  She states she only got diagnostic effect.  As soon as the anesthetic wore off she did not get any relief from the steroid.  She has pain with internal rotation behind her back and extension and abduction.  She has failed injections as well as anti-inflammatories will make her nauseous.  This is becoming a quality of life issue for her  Assessment & Plan: Visit Diagnoses:  1. Chronic right shoulder pain     Plan: Findings consistent with rotator cuff tendinitis.  X-rays well-healing with no pain.  We will go forward with an MRI of the right shoulder with follow-up with Dr. Sharol Given.  She is amenable to right shoulder arthroscopy if this is indicated in the meantime he was asked for Phenergan and gabapentin which helped her sleep at night.  And given her a refill  Follow-Up Instructions: No follow-ups on file.   Ortho Exam  Patient is alert, oriented, no adenopathy, well-dressed, normal affect, normal respiratory effort. Focused examination of her right shoulder demonstrates no acute clinical abnormality.  She has pain with internal rotation behind her back.  She has pain with active extension of her shoulder this is relieved with passive extension.  No neck symptoms.  Imaging: No results found. No images are attached to the encounter.  Labs: Lab Results  Component Value Date   HGBA1C 6.0 (H) 12/26/2015   HGBA1C 6.0 04/01/2011   REPTSTATUS 12/30/2015 FINAL 12/27/2015   GRAMSTAIN  12/27/2015    MODERATE WBC PRESENT,BOTH  PMN AND MONONUCLEAR RARE SQUAMOUS EPITHELIAL CELLS PRESENT FEW GRAM POSITIVE COCCI IN PAIRS RARE BUDDING YEAST SEEN    CULT  12/27/2015    Consistent with normal respiratory flora. Performed at [facility] 04/30/2015     Lab Results  Component Value Date   ALBUMIN 4.2 05/28/2017   ALBUMIN 4.1 11/29/2016   ALBUMIN 4.4 09/19/2016    Lab Results  Component Value Date   MG 1.4 (L) 09/19/2016   MG 1.6 (L) 06/17/2016   MG 1.6 (L) 12/26/2015   No results found for: VD25OH  No results found for: PREALBUMIN CBC EXTENDED Latest Ref Rng & Units 05/28/2017 11/29/2016 09/19/2016  WBC 4.0 - 10.5 K/uL 14.4(H) 15.3(H) 15.4(H)  RBC 3.87 - 5.11 MIL/uL 4.79 4.02 4.63  HGB 12.0 - 15.0 g/dL 14.6 12.1 13.6  HCT 36 - 46 % 42.3 35.7(L) 38.4  PLT 150 - 400 K/uL 314 397 392  NEUTROABS 1.7 - 7.7 K/uL - 12.8(H) -  LYMPHSABS 0.7 - 4.0 K/uL - 1.4 -     There is no height or weight on file to calculate BMI.  Orders:  Orders Placed This Encounter  Procedures  . XR Shoulder Right  . MR SHOULDER RIGHT WO CONTRAST   Meds ordered this encounter  Medications  . gabapentin (NEURONTIN)  300 MG capsule    Sig: Take 1 capsule (300 mg total) by mouth at bedtime. 3 times a day when necessary neuropathy pain    Dispense:  20 capsule    Refill:  3  . promethazine (PHENERGAN) 25 MG tablet    Sig: Take 1 tablet (25 mg total) by mouth every 6 (six) hours as needed for nausea or vomiting.    Dispense:  30 tablet    Refill:  0     Procedures: No procedures performed  Clinical Data: No additional findings.  ROS:  All other systems negative, except as noted in the HPI. Review of Systems  Objective: Vital Signs: LMP 08/28/2012   Specialty Comments:  No specialty comments available.  PMFS History: Patient Active Problem List   Diagnosis Date Noted  . Impingement syndrome of right shoulder 12/01/2016  . Impingement syndrome of left shoulder 12/01/2016  . HNP  (herniated nucleus pulposus), lumbar 05/05/2016  . Hyperkalemia 12/28/2015  . COPD exacerbation ([place]) 12/25/2015  . Anemia 12/25/2015  . Shortness of breath 12/25/2015  . Cardiomegaly 12/25/2015  . Bipolar 1 disorder, mixed, moderate ([place]) 06/03/2015  . CAP (community acquired pneumonia) 04/30/2015  . Pneumonitis 04/30/2015  . Status asthmaticus 04/30/2015  . Acute respiratory failure with hypoxia ([place]) 04/30/2015  . Dyspnea 04/30/2015  . Hyponatremia, severe 04/30/2015  . Lumbar spondylosis 04/27/2011  . Screening for colon cancer 01/17/2011  . Abnormal EKG 01/17/2011  . Tobacco abuse 01/17/2011  . Tobacco abuse counseling 01/17/2011  . Hypertension   . Chronic low back pain    Past Medical History:  Diagnosis Date  . Anxiety    hx  . Arthritis    "lower back, hands" (05/01/2015)  . Chronic low back pain    disk s/p 4 diskectomies, 5th fursion, then spinal cord stimulator ([place])  . Depression   . Dysrhythmia    irruglar heart beat "nothing wrong"   . Fibromyalgia   . GERD (gastroesophageal reflux disease)   . Hypertension   . Pneumonia 05/01/2015  . Rotator cuff disorder   . Seasonal asthma    "seasonal"  . Shortness of breath dyspnea   . Walking pneumonia     Family History  Problem Relation Age of Onset  . Stroke Father   . Hypertension Father   . Lung cancer Father     Past Surgical History:  Procedure Laterality Date  . BACK SURGERY    . CESAREAN SECTION  1986  . LUMBAR MICRODISCECTOMY  01/1990; 2003  . LUMBAR SPINE SURGERY  03/1991   "cleaned up scar tissue"  . POSTERIOR LUMBAR FUSION  2011; 04/27/2011  . REPAIR DURAL / CSF LEAK  02/1990  . SPINAL CORD STIMULATOR IMPLANT  2012  . SPINAL CORD STIMULATOR REMOVAL  08/2010  . TUBAL LIGATION  1986   Social History   Occupational History  . Not on file  Tobacco Use  . Smoking status: Current Every Day Smoker    Packs/day: 1.00    Years: 42.00    Pack years: 42.00    Types: Cigarettes  . Smokeless  tobacco: Never Used  . Tobacco comment: prior trial of zyban made her feel weird, pt doesnt want info right now  Substance and Sexual Activity  . Alcohol use: No  . Drug use: Yes    Types: Marijuana    Comment: 05/01/2015 "I've used lots of multiple street drugs; nothing in the past couple years", "i use weed once a month"  . Sexual activity:  Yes

## 2019-09-25 NOTE — Telephone Encounter (Signed)
Please see message below and advise? Pt was seen in the office today.

## 2019-09-26 DIAGNOSIS — G4700 Insomnia, unspecified: Secondary | ICD-10-CM | POA: Diagnosis not present

## 2019-09-26 DIAGNOSIS — Z79891 Long term (current) use of opiate analgesic: Secondary | ICD-10-CM | POA: Diagnosis not present

## 2019-10-09 ENCOUNTER — Emergency Department (HOSPITAL_COMMUNITY)
Admission: EM | Admit: 2019-10-09 | Discharge: 2019-10-10 | Disposition: A | Discharge status: Home / Self Care | Payer: Medicare Other | Attending: Emergency Medicine | Admitting: Emergency Medicine

## 2019-10-09 ENCOUNTER — Other Ambulatory Visit: Payer: Self-pay

## 2019-10-09 DIAGNOSIS — R1084 Generalized abdominal pain: Secondary | ICD-10-CM | POA: Diagnosis not present

## 2019-10-09 DIAGNOSIS — Z5321 Procedure and treatment not carried out due to patient leaving prior to being seen by health care provider: Secondary | ICD-10-CM | POA: Insufficient documentation

## 2019-10-09 DIAGNOSIS — R110 Nausea: Secondary | ICD-10-CM | POA: Diagnosis not present

## 2019-10-09 DIAGNOSIS — R0789 Other chest pain: Secondary | ICD-10-CM | POA: Diagnosis not present

## 2019-10-09 DIAGNOSIS — I10 Essential (primary) hypertension: Secondary | ICD-10-CM | POA: Diagnosis not present

## 2019-10-09 DIAGNOSIS — R079 Chest pain, unspecified: Secondary | ICD-10-CM | POA: Diagnosis not present

## 2019-10-09 LAB — COMPREHENSIVE METABOLIC PANEL
ALT: 19 U/L (ref 0–44)
AST: 20 U/L (ref 15–41)
Albumin: 4.1 g/dL (ref 3.5–5.0)
Alkaline Phosphatase: 92 U/L (ref 38–126)
Anion gap: 15 (ref 5–15)
BUN: 18 mg/dL (ref 6–20)
CO2: 18 mmol/L — ABNORMAL LOW (ref 22–32)
Calcium: 9.7 mg/dL (ref 8.9–10.3)
Chloride: 101 mmol/L (ref 98–111)
Creatinine, Ser: 0.96 mg/dL (ref 0.44–1.00)
GFR calc Af Amer: >60 mL/min (ref >60)
GFR calc non Af Amer: >60 mL/min (ref >60)
Glucose, Bld: 109 mg/dL — ABNORMAL HIGH (ref 70–99)
Potassium: 3.7 mmol/L (ref 3.5–5.1)
Sodium: 134 mmol/L — ABNORMAL LOW (ref 135–145)
Total Bilirubin: 0.8 mg/dL (ref 0.3–1.2)
Total Protein: 7.8 g/dL (ref 6.5–8.1)

## 2019-10-09 LAB — URINALYSIS, ROUTINE W REFLEX MICROSCOPIC
Bilirubin Urine: NEGATIVE
Glucose, UA: NEGATIVE mg/dL
Hgb urine dipstick: NEGATIVE
Ketones, ur: NEGATIVE mg/dL
Leukocytes,Ua: NEGATIVE
Nitrite: NEGATIVE
Protein, ur: NEGATIVE mg/dL
Specific Gravity, Urine: 1.012 (ref 1.005–1.030)
pH: 5 (ref 5.0–8.0)

## 2019-10-09 LAB — CBC
HCT: 43.6 % (ref 36.0–46.0)
Hemoglobin: 14.3 g/dL (ref 12.0–15.0)
MCH: 30.5 pg (ref 26.0–34.0)
MCHC: 32.8 g/dL (ref 30.0–36.0)
MCV: 93 fL (ref 80.0–100.0)
Platelets: 407 10*3/uL — ABNORMAL HIGH (ref 150–400)
RBC: 4.69 MIL/uL (ref 3.87–5.11)
RDW: 14.1 % (ref 11.5–15.5)
WBC: 14.4 10*3/uL — ABNORMAL HIGH (ref 4.0–10.5)
nRBC: 0 % (ref 0.0–0.2)

## 2019-10-09 LAB — LIPASE, BLOOD: Lipase: 18 U/L (ref 11–51)

## 2019-10-09 NOTE — ED Triage Notes (Signed)
Pt reports generalized abdominal pain with nausea since yesterday. 4 mg zofran given by EMS with some relief. Requesting "GI cocktail."

## 2019-10-10 NOTE — ED Notes (Signed)
Pt stated that she did not want to wait, pt advised to return if symptoms get any worse.

## 2019-10-11 ENCOUNTER — Other Ambulatory Visit: Payer: Self-pay | Admitting: Physician Assistant

## 2019-10-14 ENCOUNTER — Telehealth: Payer: Self-pay | Admitting: Physician Assistant

## 2019-10-14 ENCOUNTER — Other Ambulatory Visit: Payer: Self-pay

## 2019-10-14 DIAGNOSIS — G8929 Other chronic pain: Secondary | ICD-10-CM

## 2019-10-14 NOTE — Telephone Encounter (Signed)
Recommend that we get the MRI scan of the right shoulder prior to a repeat injection and then follow-up after the MRI scan to make a determination of optimal treatment.

## 2019-10-14 NOTE — Telephone Encounter (Signed)
Pt s/p cortisone injection of shoulder every month for the past three months ok to have repeat injection?

## 2019-10-14 NOTE — Telephone Encounter (Signed)
Called and lm on vm to advise of message below. To call with any questions otherwise order for MRI has been entered and will follow up after scan.

## 2019-10-14 NOTE — Telephone Encounter (Signed)
Pt called stating she's been getting Cortisone injections for her R shoulder about every 30  days, the last one was 8/4/21and she's wanting to schedule another and I just wanted to make sure this is ok?

## 2019-10-16 DIAGNOSIS — G4700 Insomnia, unspecified: Secondary | ICD-10-CM | POA: Diagnosis not present

## 2019-10-16 DIAGNOSIS — Z79891 Long term (current) use of opiate analgesic: Secondary | ICD-10-CM | POA: Diagnosis not present

## 2019-10-26 ENCOUNTER — Other Ambulatory Visit: Payer: Self-pay | Admitting: Physician Assistant

## 2019-10-26 MED ORDER — PREDNISONE 10 MG (21) PO TBPK: ORAL_TABLET | ORAL | 0 refills | Status: DC

## 2019-10-26 MED ORDER — CYCLOBENZAPRINE HCL 10 MG PO TABS: 10.0000 mg | ORAL_TABLET | Freq: Two times a day (BID) | ORAL | 0 refills | Status: DC | PRN

## 2019-10-26 MED ORDER — GABAPENTIN 600 MG PO TABS: 600.0000 mg | ORAL_TABLET | Freq: Every day | ORAL | 2 refills | Status: DC

## 2019-10-29 ENCOUNTER — Telehealth: Payer: Self-pay

## 2019-10-29 DIAGNOSIS — M25511 Pain in right shoulder: Secondary | ICD-10-CM | POA: Diagnosis not present

## 2019-10-29 NOTE — Telephone Encounter (Signed)
Called and sw pt she will come in today at 4:30 for sling. Advised to ask for me at the desk and we can get her fitted for one today. Will hold message pending her arrival.

## 2019-10-29 NOTE — Telephone Encounter (Signed)
Patient called she is requesting a sling for her right shoulder call back:[phone]

## 2019-10-29 NOTE — Telephone Encounter (Signed)
sure

## 2019-10-29 NOTE — Telephone Encounter (Signed)
Pt is sch for MRI of shoulder next Monday pt wants to know if she can have a sling? Ok to give?

## 2019-11-04 ENCOUNTER — Other Ambulatory Visit: Payer: Self-pay

## 2019-11-04 ENCOUNTER — Ambulatory Visit
Admission: RE | Admit: 2019-11-04 | Discharge: 2019-11-04 | Disposition: A | Discharge status: Home / Self Care | Payer: Medicare Other | Source: Ambulatory Visit | Attending: Orthopedic Surgery | Admitting: Orthopedic Surgery

## 2019-11-04 DIAGNOSIS — M25511 Pain in right shoulder: Secondary | ICD-10-CM

## 2019-11-04 DIAGNOSIS — G8929 Other chronic pain: Secondary | ICD-10-CM

## 2019-11-07 ENCOUNTER — Encounter: Payer: Self-pay | Admitting: Orthopedic Surgery

## 2019-11-07 ENCOUNTER — Ambulatory Visit (INDEPENDENT_AMBULATORY_CARE_PROVIDER_SITE_OTHER): Payer: Medicare Other | Admitting: Orthopedic Surgery

## 2019-11-07 ENCOUNTER — Other Ambulatory Visit: Payer: Self-pay

## 2019-11-07 VITALS — Ht 67.0 in | Wt 190.0 lb

## 2019-11-07 DIAGNOSIS — M75121 Complete rotator cuff tear or rupture of right shoulder, not specified as traumatic: Secondary | ICD-10-CM | POA: Diagnosis not present

## 2019-11-07 MED ORDER — GABAPENTIN 600 MG PO TABS: 600.0000 mg | ORAL_TABLET | Freq: Two times a day (BID) | ORAL | 2 refills | Status: DC

## 2019-11-07 MED ORDER — ONDANSETRON 4 MG PO TBDP: 4.0000 mg | ORAL_TABLET | Freq: Three times a day (TID) | ORAL | 0 refills | Status: DC | PRN

## 2019-11-07 NOTE — Progress Notes (Signed)
Office Visit Note   Patient: Madeline Dickerson           Date of Birth: December 06, 1960           MRN: [id number] Visit Date: 11/07/2019              Requested by: Helen Hashimoto., MD [address] [place],  [place] [address] PCP: Helen Hashimoto., MD  Chief Complaint  Patient presents with  . Right Shoulder - Follow-up    MRI review       HPI: Patient is a 59 year old woman who presents with worsening pain of the right shoulder patient states that the past several family she has had chronic problems with her GI system she states she feels nauseated she states that her arm is only comfortable in the sling she notes that she has bruising going down the biceps of the right arm.  She states that she has been getting good relief with taking Neurontin 600 mg twice a day.  Assessment & Plan: Visit Diagnoses: No diagnosis found.  Plan: With the full thickness rotator cuff tear on the right patient states he wants to proceed with arthroscopic intervention risk and benefits were discussed again discussed the importance of smoking cessation a prescription for Neurontin is called in as well as a prescription for Zofran for her GI symptoms.  Follow-Up Instructions: Return for Follow-up 2 weeks after surgery.   Ortho Exam  Patient is alert, oriented, no adenopathy, well-dressed, normal affect, normal respiratory effort. Examination patient is in a sling she has pain with abduction and flexion of 30 degrees she only has 30 degrees of internal and external rotation.  Patient has bruising that extends down the biceps consistent with possibly a acute biceps tendon rupture.  Review of the MRI scan shows a full-thickness and retracted rotator cuff tear with complete torn and retracted biceps tendon there is AC arthropathy as well as glenohumeral arthritis.  Imaging: No results found. No images are attached to the encounter.  Labs: Lab Results  Component Value Date   HGBA1C 6.0  (H) 12/26/2015   HGBA1C 6.0 04/01/2011   REPTSTATUS 12/30/2015 FINAL 12/27/2015   GRAMSTAIN  12/27/2015    MODERATE WBC PRESENT,BOTH PMN AND MONONUCLEAR RARE SQUAMOUS EPITHELIAL CELLS PRESENT FEW GRAM POSITIVE COCCI IN PAIRS RARE BUDDING YEAST SEEN    CULT  12/27/2015    Consistent with normal respiratory flora. Performed at [facility] 04/30/2015     Lab Results  Component Value Date   ALBUMIN 4.1 10/09/2019   ALBUMIN 4.2 05/28/2017   ALBUMIN 4.1 11/29/2016    Lab Results  Component Value Date   MG 1.4 (L) 09/19/2016   MG 1.6 (L) 06/17/2016   MG 1.6 (L) 12/26/2015   No results found for: VD25OH  No results found for: PREALBUMIN CBC EXTENDED Latest Ref Rng & Units 10/09/2019 05/28/2017 11/29/2016  WBC 4.0 - 10.5 K/uL 14.4(H) 14.4(H) 15.3(H)  RBC 3.87 - 5.11 MIL/uL 4.69 4.79 4.02  HGB 12.0 - 15.0 g/dL 14.3 14.6 12.1  HCT 36 - 46 % 43.6 42.3 35.7(L)  PLT 150 - 400 K/uL 407(H) 314 397  NEUTROABS 1.7 - 7.7 K/uL - - 12.8(H)  LYMPHSABS 0.7 - 4.0 K/uL - - 1.4     Body mass index is 29.76 kg/m.  Orders:  No orders of the defined types were placed in this encounter.  Meds ordered this encounter  Medications  .  ondansetron (ZOFRAN ODT) 4 MG disintegrating tablet    Sig: Take 1 tablet (4 mg total) by mouth every 8 (eight) hours as needed.    Dispense:  10 tablet    Refill:  0  . gabapentin (NEURONTIN) 600 MG tablet    Sig: Take 1 tablet (600 mg total) by mouth 2 (two) times daily.    Dispense:  60 tablet    Refill:  2     Procedures: No procedures performed  Clinical Data: No additional findings.  ROS:  All other systems negative, except as noted in the HPI. Review of Systems  Objective: Vital Signs: Ht 5\' 7"  (1.702 m)   Wt 190 lb (86.2 kg)   LMP 08/28/2012   BMI 29.76 kg/m   Specialty Comments:  No specialty comments available.  PMFS History: Patient Active Problem List   Diagnosis Date Noted  . Impingement  syndrome of right shoulder 12/01/2016  . Impingement syndrome of left shoulder 12/01/2016  . HNP (herniated nucleus pulposus), lumbar 05/05/2016  . Hyperkalemia 12/28/2015  . COPD exacerbation ([place]) 12/25/2015  . Anemia 12/25/2015  . Shortness of breath 12/25/2015  . Cardiomegaly 12/25/2015  . Bipolar 1 disorder, mixed, moderate ([place]) 06/03/2015  . CAP (community acquired pneumonia) 04/30/2015  . Pneumonitis 04/30/2015  . Status asthmaticus 04/30/2015  . Acute respiratory failure with hypoxia ([place]) 04/30/2015  . Dyspnea 04/30/2015  . Hyponatremia, severe 04/30/2015  . Lumbar spondylosis 04/27/2011  . Screening for colon cancer 01/17/2011  . Abnormal EKG 01/17/2011  . Tobacco abuse 01/17/2011  . Tobacco abuse counseling 01/17/2011  . Hypertension   . Chronic low back pain    Past Medical History:  Diagnosis Date  . Anxiety    hx  . Arthritis    "lower back, hands" (05/01/2015)  . Chronic low back pain    disk s/p 4 diskectomies, 5th fursion, then spinal cord stimulator ([place])  . Depression   . Dysrhythmia    irruglar heart beat "nothing wrong"   . Fibromyalgia   . GERD (gastroesophageal reflux disease)   . Hypertension   . Pneumonia 05/01/2015  . Rotator cuff disorder   . Seasonal asthma    "seasonal"  . Shortness of breath dyspnea   . Walking pneumonia     Family History  Problem Relation Age of Onset  . Stroke Father   . Hypertension Father   . Lung cancer Father     Past Surgical History:  Procedure Laterality Date  . BACK SURGERY    . CESAREAN SECTION  1986  . LUMBAR MICRODISCECTOMY  01/1990; 2003  . LUMBAR SPINE SURGERY  03/1991   "cleaned up scar tissue"  . POSTERIOR LUMBAR FUSION  2011; 04/27/2011  . REPAIR DURAL / CSF LEAK  02/1990  . SPINAL CORD STIMULATOR IMPLANT  2012  . SPINAL CORD STIMULATOR REMOVAL  08/2010  . TUBAL LIGATION  1986   Social History   Occupational History  . Not on file  Tobacco Use  . Smoking status: Current Every Day  Smoker    Packs/day: 1.00    Years: 42.00    Pack years: 42.00    Types: Cigarettes  . Smokeless tobacco: Never Used  . Tobacco comment: prior trial of zyban made her feel weird, pt doesnt want info right now  Substance and Sexual Activity  . Alcohol use: No  . Drug use: Yes    Types: Marijuana    Comment: 05/01/2015 "I've used lots of multiple street drugs; nothing in the  past couple years", "i use weed once a month"  . Sexual activity: Yes

## 2019-11-08 ENCOUNTER — Telehealth: Payer: Self-pay

## 2019-11-08 NOTE — Telephone Encounter (Signed)
Pt required prior auth for Ondansetron 4 mg. Called [phone] obtained approval with today's start date 11/08/19 good until 12/08/19 approval # [id number]. Called pt's pharmacy to advise of approval to call with questions.

## 2019-11-13 DIAGNOSIS — Z79891 Long term (current) use of opiate analgesic: Secondary | ICD-10-CM | POA: Diagnosis not present

## 2019-11-22 ENCOUNTER — Telehealth: Payer: Self-pay

## 2019-11-22 NOTE — Telephone Encounter (Signed)
Patient is requesting different pain medication that was discussed which is [place] she stated she got one from a family member and she did well. She is also waiting for a call back about her surgery date. Call back:[phone]

## 2019-11-25 ENCOUNTER — Other Ambulatory Visit: Payer: Self-pay | Admitting: Orthopedic Surgery

## 2019-11-25 DIAGNOSIS — R112 Nausea with vomiting, unspecified: Secondary | ICD-10-CM | POA: Diagnosis not present

## 2019-11-25 DIAGNOSIS — J449 Chronic obstructive pulmonary disease, unspecified: Secondary | ICD-10-CM | POA: Diagnosis not present

## 2019-11-25 DIAGNOSIS — I10 Essential (primary) hypertension: Secondary | ICD-10-CM | POA: Diagnosis not present

## 2019-11-25 DIAGNOSIS — K219 Gastro-esophageal reflux disease without esophagitis: Secondary | ICD-10-CM | POA: Diagnosis not present

## 2019-11-25 MED ORDER — PREGABALIN 75 MG PO CAPS: 75.0000 mg | ORAL_CAPSULE | Freq: Two times a day (BID) | ORAL | 3 refills | Status: DC

## 2019-11-25 NOTE — Telephone Encounter (Signed)
I called and lm on vm to advise rx sent to pharm. To call with questions.

## 2019-11-25 NOTE — Telephone Encounter (Signed)
Pt was given rx for Neurontin and is wanting to know if you will change her to lyrica. See message below and advise. Pending surgical repair for RTC tear.

## 2019-11-25 NOTE — Telephone Encounter (Signed)
Rx written.

## 2019-11-27 DIAGNOSIS — K219 Gastro-esophageal reflux disease without esophagitis: Secondary | ICD-10-CM | POA: Diagnosis not present

## 2019-11-27 DIAGNOSIS — R112 Nausea with vomiting, unspecified: Secondary | ICD-10-CM | POA: Diagnosis not present

## 2019-11-27 DIAGNOSIS — Z1211 Encounter for screening for malignant neoplasm of colon: Secondary | ICD-10-CM | POA: Diagnosis not present

## 2019-11-29 ENCOUNTER — Other Ambulatory Visit: Payer: Self-pay

## 2019-12-02 ENCOUNTER — Other Ambulatory Visit: Payer: Self-pay

## 2019-12-02 ENCOUNTER — Encounter (HOSPITAL_BASED_OUTPATIENT_CLINIC_OR_DEPARTMENT_OTHER): Payer: Self-pay | Admitting: Orthopedic Surgery

## 2019-12-03 DIAGNOSIS — Z79891 Long term (current) use of opiate analgesic: Secondary | ICD-10-CM | POA: Diagnosis not present

## 2019-12-05 ENCOUNTER — Encounter (HOSPITAL_BASED_OUTPATIENT_CLINIC_OR_DEPARTMENT_OTHER)
Admission: RE | Admit: 2019-12-05 | Discharge: 2019-12-05 | Disposition: A | Discharge status: Home / Self Care | Payer: Medicare Other | Source: Ambulatory Visit | Attending: Orthopedic Surgery | Admitting: Orthopedic Surgery

## 2019-12-05 ENCOUNTER — Other Ambulatory Visit: Payer: Self-pay | Admitting: Physician Assistant

## 2019-12-05 DIAGNOSIS — Z01812 Encounter for preprocedural laboratory examination: Secondary | ICD-10-CM | POA: Insufficient documentation

## 2019-12-06 ENCOUNTER — Other Ambulatory Visit (HOSPITAL_COMMUNITY)
Admission: RE | Admit: 2019-12-06 | Discharge: 2019-12-06 | Disposition: A | Discharge status: Home / Self Care | Payer: Medicare Other | Source: Ambulatory Visit | Attending: Orthopedic Surgery | Admitting: Orthopedic Surgery

## 2019-12-06 DIAGNOSIS — Z01812 Encounter for preprocedural laboratory examination: Secondary | ICD-10-CM | POA: Insufficient documentation

## 2019-12-06 DIAGNOSIS — Z20822 Contact with and (suspected) exposure to covid-19: Secondary | ICD-10-CM | POA: Diagnosis not present

## 2019-12-06 LAB — SARS CORONAVIRUS 2 (TAT 6-24 HRS): SARS Coronavirus 2: NEGATIVE

## 2019-12-09 ENCOUNTER — Encounter (HOSPITAL_BASED_OUTPATIENT_CLINIC_OR_DEPARTMENT_OTHER): Payer: Self-pay | Admitting: Orthopedic Surgery

## 2019-12-09 NOTE — Anesthesia Preprocedure Evaluation (Addendum)
Anesthesia Evaluation  Patient identified by MRN, date of birth, ID band Patient awake    Reviewed: Allergy & Precautions, NPO status , Patient's Chart, lab work & pertinent test results  History of Anesthesia Complications (+) PONV  Airway Mallampati: II  TM Distance: <3 FB Neck ROM: Full    Dental  (+) Edentulous Upper, Edentulous Lower   Pulmonary Current Smoker,    Pulmonary exam normal breath sounds clear to auscultation       Cardiovascular hypertension, Normal cardiovascular exam Rhythm:Regular Rate:Normal  EKG reviewed: NSR   Neuro/Psych PSYCHIATRIC DISORDERS Anxiety Depression Bipolar Disorder    GI/Hepatic Neg liver ROS, GERD  ,  Endo/Other    Renal/GU negative Renal ROS     Musculoskeletal  (+) Arthritis , Osteoarthritis,  Fibromyalgia -  Abdominal Normal abdominal exam  (+)   Peds  Hematology  (+) anemia ,   Anesthesia Other Findings Chronic back pain s/p surgery and spinal cord stimulator  Reproductive/Obstetrics                           Anesthesia Physical Anesthesia Plan  ASA: III  Anesthesia Plan: General and Regional   Post-op Pain Management: GA combined w/ Regional for post-op pain   Induction:   PONV Risk Score and Plan: 3 and Midazolam, Dexamethasone, Ondansetron and Scopolamine patch - Pre-op  Airway Management Planned: Oral ETT  Additional Equipment:   Intra-op Plan:   Post-operative Plan: Extubation in OR  Informed Consent: I have reviewed the patients History and Physical, chart, labs and discussed the procedure including the risks, benefits and alternatives for the proposed anesthesia with the patient or authorized representative who has indicated his/her understanding and acceptance.       Plan Discussed with:   Anesthesia Plan Comments: (Interscalene block with exparel. Will use 0.25%  bupiv instead of 0.5% bupiv given asthma history. GETA)        Anesthesia Quick Evaluation

## 2019-12-10 ENCOUNTER — Ambulatory Visit (HOSPITAL_BASED_OUTPATIENT_CLINIC_OR_DEPARTMENT_OTHER): Payer: Medicare Other | Admitting: Anesthesiology

## 2019-12-10 ENCOUNTER — Other Ambulatory Visit: Payer: Self-pay

## 2019-12-10 ENCOUNTER — Ambulatory Visit (HOSPITAL_BASED_OUTPATIENT_CLINIC_OR_DEPARTMENT_OTHER)
Admission: RE | Admit: 2019-12-10 | Discharge: 2019-12-10 | Disposition: A | Discharge status: Home / Self Care | Payer: Medicare Other | Source: Ambulatory Visit | Attending: Orthopedic Surgery | Admitting: Orthopedic Surgery

## 2019-12-10 ENCOUNTER — Encounter (HOSPITAL_BASED_OUTPATIENT_CLINIC_OR_DEPARTMENT_OTHER): Payer: Self-pay | Admitting: Orthopedic Surgery

## 2019-12-10 ENCOUNTER — Encounter (HOSPITAL_BASED_OUTPATIENT_CLINIC_OR_DEPARTMENT_OTHER)
Admission: RE | Disposition: A | Discharge status: Home / Self Care | Payer: Self-pay | Source: Ambulatory Visit | Attending: Orthopedic Surgery

## 2019-12-10 DIAGNOSIS — M549 Dorsalgia, unspecified: Secondary | ICD-10-CM | POA: Diagnosis not present

## 2019-12-10 DIAGNOSIS — M75121 Complete rotator cuff tear or rupture of right shoulder, not specified as traumatic: Secondary | ICD-10-CM

## 2019-12-10 DIAGNOSIS — X58XXXA Exposure to other specified factors, initial encounter: Secondary | ICD-10-CM | POA: Insufficient documentation

## 2019-12-10 DIAGNOSIS — F17210 Nicotine dependence, cigarettes, uncomplicated: Secondary | ICD-10-CM | POA: Insufficient documentation

## 2019-12-10 DIAGNOSIS — Z7951 Long term (current) use of inhaled steroids: Secondary | ICD-10-CM | POA: Insufficient documentation

## 2019-12-10 DIAGNOSIS — I10 Essential (primary) hypertension: Secondary | ICD-10-CM | POA: Insufficient documentation

## 2019-12-10 DIAGNOSIS — S43431A Superior glenoid labrum lesion of right shoulder, initial encounter: Secondary | ICD-10-CM | POA: Diagnosis not present

## 2019-12-10 DIAGNOSIS — D649 Anemia, unspecified: Secondary | ICD-10-CM | POA: Diagnosis not present

## 2019-12-10 DIAGNOSIS — G8929 Other chronic pain: Secondary | ICD-10-CM | POA: Diagnosis not present

## 2019-12-10 DIAGNOSIS — M797 Fibromyalgia: Secondary | ICD-10-CM | POA: Insufficient documentation

## 2019-12-10 DIAGNOSIS — F419 Anxiety disorder, unspecified: Secondary | ICD-10-CM | POA: Insufficient documentation

## 2019-12-10 DIAGNOSIS — K219 Gastro-esophageal reflux disease without esophagitis: Secondary | ICD-10-CM | POA: Diagnosis not present

## 2019-12-10 DIAGNOSIS — M7541 Impingement syndrome of right shoulder: Secondary | ICD-10-CM

## 2019-12-10 DIAGNOSIS — J45998 Other asthma: Secondary | ICD-10-CM | POA: Diagnosis not present

## 2019-12-10 DIAGNOSIS — F32A Depression, unspecified: Secondary | ICD-10-CM | POA: Insufficient documentation

## 2019-12-10 DIAGNOSIS — S46111A Strain of muscle, fascia and tendon of long head of biceps, right arm, initial encounter: Secondary | ICD-10-CM | POA: Insufficient documentation

## 2019-12-10 DIAGNOSIS — M75101 Unspecified rotator cuff tear or rupture of right shoulder, not specified as traumatic: Secondary | ICD-10-CM | POA: Diagnosis not present

## 2019-12-10 DIAGNOSIS — Z79899 Other long term (current) drug therapy: Secondary | ICD-10-CM | POA: Diagnosis not present

## 2019-12-10 DIAGNOSIS — G8918 Other acute postprocedural pain: Secondary | ICD-10-CM | POA: Diagnosis not present

## 2019-12-10 HISTORY — DX: Nausea with vomiting, unspecified: R11.2

## 2019-12-10 HISTORY — DX: Nausea with vomiting, unspecified: Z98.890

## 2019-12-10 HISTORY — PX: SHOULDER ARTHROSCOPY: SHX128

## 2019-12-10 SURGERY — ARTHROSCOPY, SHOULDER
Anesthesia: Regional | Site: Shoulder | Laterality: Right

## 2019-12-10 MED ORDER — SCOPOLAMINE 1 MG/3DAYS TD PT72
1.0000 | MEDICATED_PATCH | TRANSDERMAL | Status: DC
  Administered 2019-12-10: 1.5 mg via TRANSDERMAL

## 2019-12-10 MED ORDER — MELOXICAM 15 MG PO TABS: 15.0000 mg | ORAL_TABLET | Freq: Every day | ORAL | 2 refills | Status: AC

## 2019-12-10 MED ORDER — SODIUM CHLORIDE 0.9 % IR SOLN
Status: DC | PRN
  Administered 2019-12-10: 9000 mL

## 2019-12-10 MED ORDER — BUPIVACAINE HCL (PF) 0.25 % IJ SOLN
INTRAMUSCULAR | Status: DC | PRN
  Administered 2019-12-10: 20 mL via EPIDURAL

## 2019-12-10 MED ORDER — LIDOCAINE 2% (20 MG/ML) 5 ML SYRINGE
INTRAMUSCULAR | Status: DC | PRN
  Administered 2019-12-10: 60 mg via INTRAVENOUS

## 2019-12-10 MED ORDER — MIDAZOLAM HCL 2 MG/2ML IJ SOLN
1.0000 mg | Freq: Once | INTRAMUSCULAR | Status: AC
  Administered 2019-12-10: 2 mg via INTRAVENOUS

## 2019-12-10 MED ORDER — LACTATED RINGERS IV SOLN: INTRAVENOUS | Status: DC

## 2019-12-10 MED ORDER — DEXAMETHASONE SODIUM PHOSPHATE 10 MG/ML IJ SOLN
INTRAMUSCULAR | Status: AC
  Filled 2019-12-10: qty 1

## 2019-12-10 MED ORDER — EPHEDRINE SULFATE-NACL 50-0.9 MG/10ML-% IV SOSY
PREFILLED_SYRINGE | INTRAVENOUS | Status: DC | PRN
  Administered 2019-12-10: 20 mg via INTRAVENOUS

## 2019-12-10 MED ORDER — SUGAMMADEX SODIUM 200 MG/2ML IV SOLN
INTRAVENOUS | Status: DC | PRN
  Administered 2019-12-10: 200 mg via INTRAVENOUS

## 2019-12-10 MED ORDER — PROPOFOL 10 MG/ML IV BOLUS
INTRAVENOUS | Status: DC | PRN
  Administered 2019-12-10: 200 mg via INTRAVENOUS

## 2019-12-10 MED ORDER — SCOPOLAMINE 1 MG/3DAYS TD PT72
MEDICATED_PATCH | TRANSDERMAL | Status: AC
  Filled 2019-12-10: qty 1

## 2019-12-10 MED ORDER — ACETAMINOPHEN 500 MG PO TABS
1000.0000 mg | ORAL_TABLET | Freq: Once | ORAL | Status: AC
  Administered 2019-12-10: 1000 mg via ORAL

## 2019-12-10 MED ORDER — LIDOCAINE 2% (20 MG/ML) 5 ML SYRINGE
INTRAMUSCULAR | Status: AC
  Filled 2019-12-10: qty 5

## 2019-12-10 MED ORDER — ONDANSETRON HCL 4 MG/2ML IJ SOLN
INTRAMUSCULAR | Status: DC | PRN
  Administered 2019-12-10: 4 mg via INTRAVENOUS

## 2019-12-10 MED ORDER — PROPOFOL 10 MG/ML IV BOLUS
INTRAVENOUS | Status: AC
  Filled 2019-12-10: qty 20

## 2019-12-10 MED ORDER — CEFAZOLIN SODIUM-DEXTROSE 2-4 GM/100ML-% IV SOLN
2.0000 g | INTRAVENOUS | Status: AC
  Administered 2019-12-10: 2 g via INTRAVENOUS

## 2019-12-10 MED ORDER — AMISULPRIDE (ANTIEMETIC) 5 MG/2ML IV SOLN: 10.0000 mg | Freq: Once | INTRAVENOUS | Status: DC | PRN

## 2019-12-10 MED ORDER — CEFAZOLIN SODIUM-DEXTROSE 2-4 GM/100ML-% IV SOLN
INTRAVENOUS | Status: AC
  Filled 2019-12-10: qty 100

## 2019-12-10 MED ORDER — FENTANYL CITRATE (PF) 100 MCG/2ML IJ SOLN
50.0000 ug | Freq: Once | INTRAMUSCULAR | Status: AC
  Administered 2019-12-10: 50 ug via INTRAVENOUS

## 2019-12-10 MED ORDER — ONDANSETRON HCL 4 MG/2ML IJ SOLN
INTRAMUSCULAR | Status: AC
  Filled 2019-12-10: qty 2

## 2019-12-10 MED ORDER — FENTANYL CITRATE (PF) 100 MCG/2ML IJ SOLN
INTRAMUSCULAR | Status: AC
  Filled 2019-12-10: qty 2

## 2019-12-10 MED ORDER — MIDAZOLAM HCL 2 MG/2ML IJ SOLN
INTRAMUSCULAR | Status: AC
  Filled 2019-12-10: qty 2

## 2019-12-10 MED ORDER — ROCURONIUM BROMIDE 10 MG/ML (PF) SYRINGE
PREFILLED_SYRINGE | INTRAVENOUS | Status: AC
  Filled 2019-12-10: qty 10

## 2019-12-10 MED ORDER — PROMETHAZINE HCL 25 MG/ML IJ SOLN: 6.2500 mg | INTRAMUSCULAR | Status: DC | PRN

## 2019-12-10 MED ORDER — BUPIVACAINE LIPOSOME 1.3 % IJ SUSP
INTRAMUSCULAR | Status: DC | PRN
  Administered 2019-12-10: 10 mL

## 2019-12-10 MED ORDER — PHENYLEPHRINE 40 MCG/ML (10ML) SYRINGE FOR IV PUSH (FOR BLOOD PRESSURE SUPPORT)
PREFILLED_SYRINGE | INTRAVENOUS | Status: AC
  Filled 2019-12-10: qty 10

## 2019-12-10 MED ORDER — DEXAMETHASONE SODIUM PHOSPHATE 10 MG/ML IJ SOLN
INTRAMUSCULAR | Status: DC | PRN
  Administered 2019-12-10: 10 mg via INTRAVENOUS

## 2019-12-10 MED ORDER — PHENYLEPHRINE 40 MCG/ML (10ML) SYRINGE FOR IV PUSH (FOR BLOOD PRESSURE SUPPORT)
PREFILLED_SYRINGE | INTRAVENOUS | Status: DC | PRN
  Administered 2019-12-10: 80 ug via INTRAVENOUS

## 2019-12-10 MED ORDER — ROCURONIUM BROMIDE 10 MG/ML (PF) SYRINGE
PREFILLED_SYRINGE | INTRAVENOUS | Status: DC | PRN
  Administered 2019-12-10: 90 mg via INTRAVENOUS

## 2019-12-10 MED ORDER — ACETAMINOPHEN 500 MG PO TABS
ORAL_TABLET | ORAL | Status: AC
  Filled 2019-12-10: qty 2

## 2019-12-10 MED ORDER — FENTANYL CITRATE (PF) 100 MCG/2ML IJ SOLN: 25.0000 ug | INTRAMUSCULAR | Status: DC | PRN

## 2019-12-10 MED ORDER — EPHEDRINE 5 MG/ML INJ
INTRAVENOUS | Status: AC
  Filled 2019-12-10: qty 10

## 2019-12-10 SURGICAL SUPPLY — 53 items
AID PSTN UNV HD RSTRNT DISP (MISCELLANEOUS) ×1
BLADE EXCALIBUR 4.0MM X 13CM (MISCELLANEOUS) ×1
BLADE EXCALIBUR 4.0X13 (MISCELLANEOUS) ×2 IMPLANT
BLADE SURG 15 STRL LF DISP TIS (BLADE) IMPLANT
BLADE SURG 15 STRL SS (BLADE)
BURR OVAL 8 FLU 4.0MM X 13CM (MISCELLANEOUS) ×1
BURR OVAL 8 FLU 4.0X13 (MISCELLANEOUS) ×2 IMPLANT
BURR OVAL 8 FLU 5.0MM X 13CM (MISCELLANEOUS)
BURR OVAL 8 FLU 5.0X13 (MISCELLANEOUS) IMPLANT
CANNULA 5.75X7 CRYSTAL CLEAR (CANNULA) ×3 IMPLANT
CANNULA SHOULDER 7CM (CANNULA) ×3 IMPLANT
CANNULA TWIST IN 8.25X7CM (CANNULA) ×3 IMPLANT
COVER WAND RF STERILE (DRAPES) IMPLANT
DISSECTOR 4.0MM X 13CM (MISCELLANEOUS) ×3 IMPLANT
DRAPE INCISE IOBAN 66X45 STRL (DRAPES) IMPLANT
DRAPE STERI 35X30 U-POUCH (DRAPES) ×3 IMPLANT
DRAPE U-SHAPE 47X51 STRL (DRAPES) ×3 IMPLANT
DRAPE U-SHAPE 76X120 STRL (DRAPES) ×6 IMPLANT
DRSG EMULSION OIL 3X3 NADH (GAUZE/BANDAGES/DRESSINGS) ×6 IMPLANT
DRSG PAD ABDOMINAL 8X10 ST (GAUZE/BANDAGES/DRESSINGS) ×3 IMPLANT
DURAPREP 26ML APPLICATOR (WOUND CARE) ×3 IMPLANT
ELECT REM PT RETURN 9FT ADLT (ELECTROSURGICAL)
ELECTRODE REM PT RTRN 9FT ADLT (ELECTROSURGICAL) IMPLANT
GAUZE 4X4 16PLY RFD (DISPOSABLE) ×3 IMPLANT
GAUZE SPONGE 4X4 12PLY STRL (GAUZE/BANDAGES/DRESSINGS) ×3 IMPLANT
GLOVE BIOGEL PI IND STRL 9 (GLOVE) ×1 IMPLANT
GLOVE BIOGEL PI INDICATOR 9 (GLOVE) ×2
GLOVE SURG ORTHO 9.0 STRL STRW (GLOVE) ×3 IMPLANT
GOWN STRL REUS W/ TWL LRG LVL3 (GOWN DISPOSABLE) ×1 IMPLANT
GOWN STRL REUS W/ TWL XL LVL3 (GOWN DISPOSABLE) ×1 IMPLANT
GOWN STRL REUS W/TWL LRG LVL3 (GOWN DISPOSABLE) ×3
GOWN STRL REUS W/TWL XL LVL3 (GOWN DISPOSABLE) ×3
MANIFOLD NEPTUNE II (INSTRUMENTS) ×3 IMPLANT
NDL SAFETY ECLIPSE 18X1.5 (NEEDLE) ×1 IMPLANT
NEEDLE HYPO 18GX1.5 SHARP (NEEDLE) ×3
PACK ARTHROSCOPY DSU (CUSTOM PROCEDURE TRAY) ×3 IMPLANT
PACK BASIN DAY SURGERY FS (CUSTOM PROCEDURE TRAY) ×3 IMPLANT
PENCIL SMOKE EVACUATOR (MISCELLANEOUS) IMPLANT
PROBE APOLLO 90XL (SURGICAL WAND) ×3 IMPLANT
RESTRAINT HEAD UNIVERSAL NS (MISCELLANEOUS) ×3 IMPLANT
SLING ARM FOAM STRAP LRG (SOFTGOODS) ×3 IMPLANT
SPONGE LAP 4X18 RFD (DISPOSABLE) IMPLANT
SUT ETHIBOND 2 OS 4 DA (SUTURE) IMPLANT
SUT ETHILON 2 0 FSLX (SUTURE) ×3 IMPLANT
SYR BULB EAR ULCER 3OZ GRN STR (SYRINGE) IMPLANT
TAPE HYPAFIX 6 X30' (GAUZE/BANDAGES/DRESSINGS) ×1
TAPE HYPAFIX 6X30 (GAUZE/BANDAGES/DRESSINGS) ×2 IMPLANT
TOWEL GREEN STERILE FF (TOWEL DISPOSABLE) ×3 IMPLANT
TUBE CONNECTING 20'X1/4 (TUBING) ×1
TUBE CONNECTING 20X1/4 (TUBING) ×2 IMPLANT
TUBING ARTHROSCOPY IRRIG 16FT (MISCELLANEOUS) ×3 IMPLANT
WATER STERILE IRR 1000ML POUR (IV SOLUTION) ×3 IMPLANT
YANKAUER SUCT BULB TIP NO VENT (SUCTIONS) IMPLANT

## 2019-12-10 NOTE — Op Note (Signed)
12/10/2019  8:26 AM  PATIENT:  Madeline Dickerson    PRE-OPERATIVE DIAGNOSIS:  Rotator Cuff Tear Right Shoulder  POST-OPERATIVE DIAGNOSIS:  Same  PROCEDURE:  RIGHT SHOULDER ARTHROSCOPY AND DEBRIDEMENT  SURGEON:  Newt Minion, MD  PHYSICIAN ASSISTANT:None ANESTHESIA:   General  PREOPERATIVE INDICATIONS:  Madeline Dickerson is a  59 y.o. female with a diagnosis of Rotator Cuff Tear Right Shoulder who failed conservative measures and elected for surgical management.    The risks benefits and alternatives were discussed with the patient preoperatively including but not limited to the risks of infection, bleeding, nerve injury, cardiopulmonary complications, the need for revision surgery, among others, and the patient was willing to proceed.  OPERATIVE IMPLANTS: None  @ENCIMAGES @  OPERATIVE FINDINGS: Complete rotator cuff tear with retraction proximal to the glenoid with complete tear of the biceps tendon and large labral tear.  OPERATIVE PROCEDURE: Patient brought the operating room after undergoing an interscalene block she then underwent a general anesthetic.  After adequate levels anesthesia were obtained patient was placed in the beachchair position and the right upper extremity was prepped using DuraPrep draped into a sterile field a timeout was called.  The scope was inserted from the posterior portal and an anterior portal was established with an outside in technique with an 18-gauge spinal.  Visualization showed complete degenerative tear of the rotator cuff with retraction of the tear proximal to the glenoid.  Using the shaver and the electrical wand the remnants were debrided back to healthy viable margins.  The biceps tendon was completely torn there was a large SLAP lesion that was also debrided.  There was essentially eburnated bone on the glenoid and the humeral head with advanced arthritic changes.  After debridement the instruments were removed the scope was inserted from the posterior  portal and a new lateral working portal was established.  Patient underwent bursectomy there was a bony spur at the [facility] joint and using the bur patient underwent subacromial decompression of the acromium as well as the distal clavicle to relieve the impingement.  Electrocautery was used for hemostasis.  The instruments were removed the portals were closed using 2-0 nylon a sterile dressing was applied patient was extubated taken the PACU in stable condition.   DISCHARGE PLANNING:  Antibiotic duration: Preoperative antibiotics  Weightbearing: Not applicable  Pain medication: Prescription for Percocet  Dressing care/ Wound VAC: Discontinue dressing in 2 days  Ambulatory devices: Not applicable  Discharge to: Home.  Follow-up: In the office 1 week post operative.

## 2019-12-10 NOTE — Discharge Instructions (Signed)
TYLENOL taken at 6:40 AM. No tylenol products till after 10:40 am    Post Anesthesia Home Care Instructions  Activity: Get plenty of rest for the remainder of the day. A responsible individual must stay with you for 24 hours following the procedure.  For the next 24 hours, DO NOT: -Drive a car -Paediatric nurse -Drink alcoholic beverages -Take any medication unless instructed by your physician -Make any legal decisions or sign important papers.  Meals: Start with liquid foods such as gelatin or soup. Progress to regular foods as tolerated. Avoid greasy, spicy, heavy foods. If nausea and/or vomiting occur, drink only clear liquids until the nausea and/or vomiting subsides. Call your physician if vomiting continues.  Special Instructions/Symptoms: Your throat may feel dry or sore from the anesthesia or the breathing tube placed in your throat during surgery. If this causes discomfort, gargle with warm salt water. The discomfort should disappear within 24 hours.  If you had a scopolamine patch placed behind your ear for the management of post- operative nausea and/or vomiting:  1. The medication in the patch is effective for 72 hours, after which it should be removed.  Wrap patch in a tissue and discard in the trash. Wash hands thoroughly with soap and water. 2. You may remove the patch earlier than 72 hours if you experience unpleasant side effects which may include dry mouth, dizziness or visual disturbances. 3. Avoid touching the patch. Wash your hands with soap and water after contact with the patch.    Regional Anesthesia Blocks  1. Numbness or the inability to move the "blocked" extremity may last from 3-48 hours after placement. The length of time depends on the medication injected and your individual response to the medication. If the numbness is not going away after 48 hours, call your surgeon.  2. The extremity that is blocked will need to be protected until the numbness is gone  and the  Strength has returned. Because you cannot feel it, you will need to take extra care to avoid injury. Because it may be weak, you may have difficulty moving it or using it. You may not know what position it is in without looking at it while the block is in effect.  3. For blocks in the legs and feet, returning to weight bearing and walking needs to be done carefully. You will need to wait until the numbness is entirely gone and the strength has returned. You should be able to move your leg and foot normally before you try and bear weight or walk. You will need someone to be with you when you first try to ensure you do not fall and possibly risk injury.  4. Bruising and tenderness at the needle site are common side effects and will resolve in a few days.  5. Persistent numbness or new problems with movement should be communicated to the surgeon or the [place] [phone] [place] [phone]).Call your surgeon if you experience:   1.  Fever over 101.0. 2.  Inability to urinate. 3.  Nausea and/or vomiting. 4.  Extreme swelling or bruising at the surgical site. 5.  Continued bleeding from the incision. 6.  Increased pain, redness or drainage from the incision. 7.  Problems related to your pain medication. 8.  Any problems and/or concerns  Information for Discharge Teaching: EXPAREL (bupivacaine liposome injectable suspension)   Your surgeon or anesthesiologist gave you EXPAREL(bupivacaine) to help control your pain after surgery.   EXPAREL is a  local anesthetic that provides pain relief by numbing the tissue around the surgical site.  EXPAREL is designed to release pain medication over time and can control pain for up to 72 hours.  Depending on how you respond to EXPAREL, you may require less pain medication during your recovery.  Possible side effects:  Temporary loss of sensation or ability to move in the area where bupivacaine was  injected.  Nausea, vomiting, constipation  Rarely, numbness and tingling in your mouth or lips, lightheadedness, or anxiety may occur.  Call your doctor right away if you think you may be experiencing any of these sensations, or if you have other questions regarding possible side effects.  Follow all other discharge instructions given to you by your surgeon or nurse. Eat a healthy diet and drink plenty of water or other fluids.  If you return to the hospital for any reason within 96 hours following the administration of EXPAREL, it is important for health care providers to know that you have received this anesthetic. A teal colored band has been placed on your arm with the date, time and amount of EXPAREL you have received in order to alert and inform your health care providers. Please leave this armband in place for the full 96 hours following administration, and then you may remove the band.

## 2019-12-10 NOTE — Progress Notes (Signed)
Assisted Dr. Elgie Congo with right, ultrasound guided, interscalene  block. Side rails up, monitors on throughout procedure. See vital signs in flow sheet. Tolerated Procedure well.

## 2019-12-10 NOTE — H&P (Signed)
Madeline Dickerson is an 59 y.o. female.   Chief Complaint: Right shoulder impingement HPI: Patient is a 59 year old woman who presents with worsening pain of the right shoulder patient states that the past several family she has had chronic problems with her GI system she states she feels nauseated she states that her arm is only comfortable in the sling she notes that she has bruising going down the biceps of the right arm.  She states that she has been getting good relief with taking Neurontin 600 mg twice a day.  Past Medical History:  Diagnosis Date  . Anxiety    hx  . Arthritis    "lower back, hands" (05/01/2015)  . Chronic low back pain    disk s/p 4 diskectomies, 5th fursion, then spinal cord stimulator ([place])  . Depression   . Dysrhythmia    irruglar heart beat "nothing wrong"   . Fibromyalgia   . GERD (gastroesophageal reflux disease)   . Hypertension   . Pneumonia 05/01/2015  . PONV (postoperative nausea and vomiting)   . Rotator cuff disorder   . Seasonal asthma    "seasonal"  . Shortness of breath dyspnea   . Walking pneumonia     Past Surgical History:  Procedure Laterality Date  . BACK SURGERY    . CESAREAN SECTION  1986  . LUMBAR MICRODISCECTOMY  01/1990; 2003  . LUMBAR SPINE SURGERY  03/1991   "cleaned up scar tissue"  . POSTERIOR LUMBAR FUSION  2011; 04/27/2011  . REPAIR DURAL / CSF LEAK  02/1990  . SPINAL CORD STIMULATOR IMPLANT  2012  . SPINAL CORD STIMULATOR REMOVAL  08/2010  . TUBAL LIGATION  1986    Family History  Problem Relation Age of Onset  . Stroke Father   . Hypertension Father   . Lung cancer Father    Social History:  reports that she has been smoking cigarettes. She has a 42.00 pack-year smoking history. She has never used smokeless tobacco. She reports current drug use. Drug: Marijuana. She reports that she does not drink alcohol.  Allergies:  Allergies  Allergen Reactions  . Haldol [Haloperidol Lactate] Other (See Comments)    Pt states  that she was unable to talk or move.     Medications Prior to Admission  Medication Sig Dispense Refill  . albuterol (PROVENTIL HFA;VENTOLIN HFA) 108 (90 Base) MCG/ACT inhaler Inhale 2 puffs into the lungs every 6 (six) hours as needed for wheezing or shortness of breath. 1 Inhaler 3  . ALPRAZolam (XANAX) 0.5 MG tablet Take 0.5 mg by mouth at bedtime as needed for anxiety.   1  . amLODIPine Besylate (NORVASC PO) Take by mouth.    . budesonide-formoterol (SYMBICORT) 80-4.5 MCG/ACT inhaler Inhale 2 puffs into the lungs 2 (two) times daily. 1 Inhaler 3  . FLUoxetine (PROZAC) 20 MG capsule Take 20 mg by mouth daily.    Marland Kitchen gabapentin (NEURONTIN) 600 MG tablet Take 1 tablet (600 mg total) by mouth 2 (two) times daily. 60 tablet 2  . ibuprofen (ADVIL) 800 MG tablet Take 1 tablet (800 mg total) by mouth every 8 (eight) hours as needed. 30 tablet 0  . OMEPRAZOLE PO Take by mouth.    . pregabalin (LYRICA) 75 MG capsule Take 1 capsule (75 mg total) by mouth 2 (two) times daily. 60 capsule 3  . promethazine (PHENERGAN) 25 MG tablet TAKE 1 TABLET(25 MG) BY MOUTH EVERY 6 HOURS AS NEEDED FOR NAUSEA OR VOMITING 30 tablet 0  .  SUBOXONE 8-2 MG FILM Place 1 Film under the tongue 2 (two) times daily.    Marland Kitchen tiotropium (SPIRIVA HANDIHALER) 18 MCG inhalation capsule Place 1 capsule (18 mcg total) into inhaler and inhale every morning. (Patient taking differently: Place 18 mcg into inhaler and inhale every evening. ) 30 capsule 3  . alum & mag hydroxide-simeth (MAALOX ADVANCED MAX ST) 400-400-40 MG/5ML suspension Take 10 mLs by mouth every 6 (six) hours as needed for indigestion. 355 mL 0  . sucralfate (CARAFATE) 1 GM/10ML suspension Take 10 mLs (1 g total) by mouth 4 (four) times daily -  with meals and at bedtime. 420 mL 0    No results found for this or any previous visit (from the past 48 hour(s)). No results found.  Review of Systems  All other systems reviewed and are negative.   Blood pressure 122/75, pulse  79, temperature 98.5 F (36.9 C), temperature source Oral, resp. rate 16, height 5\' 7"  (1.702 m), weight 85.9 kg, last menstrual period 08/28/2012, SpO2 100 %. Physical Exam   Patient is alert, oriented, no adenopathy, well-dressed, normal affect, normal respiratory effort. Examination patient is in a sling she has pain with abduction and flexion of 30 degrees she only has 30 degrees of internal and external rotation.  Patient has bruising that extends down the biceps consistent with possibly a acute biceps tendon rupture.  Review of the MRI scan shows a full-thickness and retracted rotator cuff tear with complete torn and retracted biceps tendon there is AC arthropathy as well as glenohumeral arthritis.Lungs Clear heart RRR Assessment/Plan Plan: With the full thickness rotator cuff tear on the right patient states he wants to proceed with arthroscopic intervention risk and benefits were discussed again discussed the importance of smoking cessation a prescription for Neurontin is called in as well as a prescription for Zofran for her GI symptoms.  Bevely Palmer Johnda Billiot, PA 12/10/2019, 6:41 AM

## 2019-12-10 NOTE — Transfer of Care (Signed)
Immediate Anesthesia Transfer of Care Note  Patient: Madeline Dickerson  Procedure(s) Performed: RIGHT SHOULDER ARTHROSCOPY AND DEBRIDEMENT (Right Shoulder)  Patient Location: PACU  Anesthesia Type:GA combined with regional for post-op pain  Level of Consciousness: awake, alert  and oriented  Airway & Oxygen Therapy: Patient Spontanous Breathing and Patient connected to face mask oxygen  Post-op Assessment: Report given to RN and Post -op Vital signs reviewed and stable  Post vital signs: Reviewed and stable  Last Vitals:  Vitals Value Taken Time  BP 101/67 12/10/19 0831  Temp    Pulse 76 12/10/19 0834  Resp 17 12/10/19 0834  SpO2 100 % 12/10/19 0834  Vitals shown include unvalidated device data.  Last Pain:  Vitals:   12/10/19 0633  TempSrc: Oral  PainSc: 4       Patients Stated Pain Goal: 5 (24/09/73 5329)  Complications: No complications documented.

## 2019-12-10 NOTE — Anesthesia Procedure Notes (Signed)
Anesthesia Regional Block: Interscalene brachial plexus block   Pre-Anesthetic Checklist: ,, timeout performed, Correct Patient, Correct Site, Correct Laterality, Correct Procedure, Correct Position, site marked, Risks and benefits discussed,  Surgical consent,  Pre-op evaluation,  At surgeon's request and post-op pain management  Laterality: Right  Prep: chloraprep       Needles:  Injection technique: Single-shot  Needle Type: Echogenic Stimulator Needle     Needle Length: 10cm  Needle Gauge: 20     Additional Needles:   Procedures:,,,, ultrasound used (permanent image in chart),,,,  Narrative:  Start time: 12/10/2019 6:55 AM End time: 12/10/2019 7:00 AM Injection made incrementally with aspirations every 5 mL.  Performed by: Personally  Anesthesiologist: Merlinda Frederick, MD  Additional Notes: Functioning IV was confirmed and monitors applied.  Sterile prep and drape,hand hygiene and sterile gloves were used.Ultrasound guidance: relevant anatomy identified, needle position confirmed, local anesthetic spread visualized around nerve(s)., vascular puncture avoided.  Image printed for medical record.  Negative aspiration and negative test dose prior to incremental administration of local anesthetic. The patient tolerated the procedure well.

## 2019-12-10 NOTE — Anesthesia Procedure Notes (Signed)
Procedure Name: Intubation Date/Time: 12/10/2019 7:41 AM Performed by: [place] Faren Florence C, CRNA Pre-anesthesia Checklist: Patient identified, Emergency Drugs available, Suction available and Patient being monitored Patient Re-evaluated:Patient Re-evaluated prior to induction Oxygen Delivery Method: Circle system utilized Preoxygenation: Pre-oxygenation with 100% oxygen Induction Type: IV induction Ventilation: Mask ventilation without difficulty Laryngoscope Size: Mac and 3 Grade View: Grade I Tube type: Oral Tube size: 7.0 mm Number of attempts: 1 Airway Equipment and Method: Stylet and Oral airway Placement Confirmation: ETT inserted through vocal cords under direct vision,  positive ETCO2 and breath sounds checked- equal and bilateral Secured at: 20 cm Tube secured with: Tape Dental Injury: Teeth and Oropharynx as per pre-operative assessment

## 2019-12-10 NOTE — Anesthesia Postprocedure Evaluation (Signed)
Anesthesia Post Note  Patient: Madeline Dickerson  Procedure(s) Performed: RIGHT SHOULDER ARTHROSCOPY AND DEBRIDEMENT (Right Shoulder)     Patient location during evaluation: PACU Anesthesia Type: Regional and General Level of consciousness: sedated Pain management: pain level controlled Vital Signs Assessment: post-procedure vital signs reviewed and stable Respiratory status: spontaneous breathing and respiratory function stable Cardiovascular status: stable Postop Assessment: no apparent nausea or vomiting Anesthetic complications: no   No complications documented.  Last Vitals:  Vitals:   12/10/19 0900 12/10/19 1000  BP: 108/64 112/64  Pulse: 67 69  Resp: 16 18  Temp:  36.8 C  SpO2: 98% 96%    Last Pain:  Vitals:   12/10/19 1000  TempSrc:   PainSc: 0-No pain                 Merlinda Frederick

## 2019-12-11 ENCOUNTER — Encounter (HOSPITAL_BASED_OUTPATIENT_CLINIC_OR_DEPARTMENT_OTHER): Payer: Self-pay | Admitting: Orthopedic Surgery

## 2019-12-17 DIAGNOSIS — Z79891 Long term (current) use of opiate analgesic: Secondary | ICD-10-CM | POA: Diagnosis not present

## 2019-12-17 DIAGNOSIS — G4700 Insomnia, unspecified: Secondary | ICD-10-CM | POA: Diagnosis not present

## 2020-01-03 DIAGNOSIS — G4700 Insomnia, unspecified: Secondary | ICD-10-CM | POA: Diagnosis not present

## 2020-01-03 DIAGNOSIS — Z79891 Long term (current) use of opiate analgesic: Secondary | ICD-10-CM | POA: Diagnosis not present

## 2020-01-24 DIAGNOSIS — Z79891 Long term (current) use of opiate analgesic: Secondary | ICD-10-CM | POA: Diagnosis not present

## 2020-01-24 DIAGNOSIS — G4700 Insomnia, unspecified: Secondary | ICD-10-CM | POA: Diagnosis not present

## 2020-01-30 DIAGNOSIS — K219 Gastro-esophageal reflux disease without esophagitis: Secondary | ICD-10-CM | POA: Diagnosis not present

## 2020-01-30 DIAGNOSIS — B349 Viral infection, unspecified: Secondary | ICD-10-CM | POA: Diagnosis not present

## 2020-02-07 DIAGNOSIS — Z79891 Long term (current) use of opiate analgesic: Secondary | ICD-10-CM | POA: Diagnosis not present

## 2020-02-07 DIAGNOSIS — Z23 Encounter for immunization: Secondary | ICD-10-CM | POA: Diagnosis not present

## 2020-02-20 DIAGNOSIS — Z79891 Long term (current) use of opiate analgesic: Secondary | ICD-10-CM | POA: Diagnosis not present

## 2020-02-20 DIAGNOSIS — G4700 Insomnia, unspecified: Secondary | ICD-10-CM | POA: Diagnosis not present

## 2020-03-05 DIAGNOSIS — Z79891 Long term (current) use of opiate analgesic: Secondary | ICD-10-CM | POA: Diagnosis not present

## 2020-03-05 DIAGNOSIS — G4700 Insomnia, unspecified: Secondary | ICD-10-CM | POA: Diagnosis not present

## 2020-03-19 DIAGNOSIS — G4700 Insomnia, unspecified: Secondary | ICD-10-CM | POA: Diagnosis not present

## 2020-03-19 DIAGNOSIS — Z79891 Long term (current) use of opiate analgesic: Secondary | ICD-10-CM | POA: Diagnosis not present

## 2020-04-02 DIAGNOSIS — G4700 Insomnia, unspecified: Secondary | ICD-10-CM | POA: Diagnosis not present

## 2020-04-02 DIAGNOSIS — Z79891 Long term (current) use of opiate analgesic: Secondary | ICD-10-CM | POA: Diagnosis not present

## 2020-04-03 DIAGNOSIS — Z79899 Other long term (current) drug therapy: Secondary | ICD-10-CM | POA: Diagnosis not present

## 2020-04-03 DIAGNOSIS — M25519 Pain in unspecified shoulder: Secondary | ICD-10-CM | POA: Diagnosis not present

## 2020-04-03 DIAGNOSIS — Z87891 Personal history of nicotine dependence: Secondary | ICD-10-CM | POA: Diagnosis not present

## 2020-04-03 DIAGNOSIS — E785 Hyperlipidemia, unspecified: Secondary | ICD-10-CM | POA: Diagnosis not present

## 2020-04-03 DIAGNOSIS — J449 Chronic obstructive pulmonary disease, unspecified: Secondary | ICD-10-CM | POA: Diagnosis not present

## 2020-04-03 DIAGNOSIS — I10 Essential (primary) hypertension: Secondary | ICD-10-CM | POA: Diagnosis not present

## 2020-04-03 DIAGNOSIS — K219 Gastro-esophageal reflux disease without esophagitis: Secondary | ICD-10-CM | POA: Diagnosis not present

## 2020-04-20 ENCOUNTER — Ambulatory Visit (INDEPENDENT_AMBULATORY_CARE_PROVIDER_SITE_OTHER): Payer: Medicare Other | Admitting: Orthopedic Surgery

## 2020-04-20 ENCOUNTER — Encounter: Payer: Self-pay | Admitting: Orthopedic Surgery

## 2020-04-20 ENCOUNTER — Ambulatory Visit (INDEPENDENT_AMBULATORY_CARE_PROVIDER_SITE_OTHER): Payer: Medicare Other

## 2020-04-20 DIAGNOSIS — M25511 Pain in right shoulder: Secondary | ICD-10-CM

## 2020-04-20 DIAGNOSIS — G8929 Other chronic pain: Secondary | ICD-10-CM

## 2020-04-20 DIAGNOSIS — M75121 Complete rotator cuff tear or rupture of right shoulder, not specified as traumatic: Secondary | ICD-10-CM

## 2020-04-20 NOTE — Progress Notes (Signed)
Office Visit Note   Patient: Madeline Dickerson           Date of Birth: [birth year]           MRN: [id number] Visit Date: 04/20/2020              Requested by: Helen Hashimoto., MD [address] [place],  [place] [id number] PCP: Helen Hashimoto., MD  Chief Complaint  Patient presents with  . Right Shoulder - Follow-up    12/10/19 right shoulder scope and debridement       HPI: Patient is a 60 year old woman who presents 4 months status post right shoulder arthroscopy and debridement.  Patient states that her shoulder was feeling great she states that she was about 98% better however she fell in the bathroom landing on her left side and the shower rod landed on her right shoulder.  Patient states that her shoulder was doing great until this fall.  Assessment & Plan: Visit Diagnoses:  1. Chronic right shoulder pain   2. Nontraumatic complete tear of right rotator cuff     Plan: Recommended topical Voltaren gel resume her active and passive range of motion exercises.  Follow-Up Instructions: Return if symptoms worsen or fail to improve.   Ortho Exam  Patient is alert, oriented, no adenopathy, well-dressed, normal affect, normal respiratory effort. Examination patient has no pain or crepitation with range of motion of the shoulder the sutures have been removed.  There is no ecchymosis or bruising around the shoulder.  Imaging: XR Shoulder Right  Result Date: 04/20/2020 2 view radiographs of the right shoulder shows no acute fractures there is arthritic changes of the before meals and glenohumeral joint.  No images are attached to the encounter.  Labs: Lab Results  Component Value Date   HGBA1C 6.0 (H) 12/26/2015   HGBA1C 6.0 04/01/2011   REPTSTATUS 12/30/2015 FINAL 12/27/2015   GRAMSTAIN  12/27/2015    MODERATE WBC PRESENT,BOTH PMN AND MONONUCLEAR RARE SQUAMOUS EPITHELIAL CELLS PRESENT FEW GRAM POSITIVE COCCI IN PAIRS RARE BUDDING YEAST SEEN     CULT  12/27/2015    Consistent with normal respiratory flora. Performed at [facility] 04/30/2015     Lab Results  Component Value Date   ALBUMIN 4.1 10/09/2019   ALBUMIN 4.2 05/28/2017   ALBUMIN 4.1 11/29/2016    Lab Results  Component Value Date   MG 1.4 (L) 09/19/2016   MG 1.6 (L) 06/17/2016   MG 1.6 (L) 12/26/2015   No results found for: VD25OH  No results found for: PREALBUMIN CBC EXTENDED Latest Ref Rng & Units 10/09/2019 05/28/2017 11/29/2016  WBC 4.0 - 10.5 K/uL 14.4(H) 14.4(H) 15.3(H)  RBC 3.87 - 5.11 MIL/uL 4.69 4.79 4.02  HGB 12.0 - 15.0 g/dL 14.3 14.6 12.1  HCT 36.0 - 46.0 % 43.6 42.3 35.7(L)  PLT 150 - 400 K/uL 407(H) 314 397  NEUTROABS 1.7 - 7.7 K/uL - - 12.8(H)  LYMPHSABS 0.7 - 4.0 K/uL - - 1.4     There is no height or weight on file to calculate BMI.  Orders:  Orders Placed This Encounter  Procedures  . XR Shoulder Right   No orders of the defined types were placed in this encounter.    Procedures: No procedures performed  Clinical Data: No additional findings.  ROS:  All other systems negative, except as noted in the HPI. Review of Systems  Objective: Vital Signs: LMP 08/28/2012  Specialty Comments:  No specialty comments available.  PMFS History: Patient Active Problem List   Diagnosis Date Noted  . Nontraumatic complete tear of right rotator cuff   . Impingement syndrome of right shoulder 12/01/2016  . Impingement syndrome of left shoulder 12/01/2016  . HNP (herniated nucleus pulposus), lumbar 05/05/2016  . Hyperkalemia 12/28/2015  . COPD exacerbation ([place]) 12/25/2015  . Anemia 12/25/2015  . Shortness of breath 12/25/2015  . Cardiomegaly 12/25/2015  . Bipolar 1 disorder, mixed, moderate ([place]) 06/03/2015  . CAP (community acquired pneumonia) 04/30/2015  . Pneumonitis 04/30/2015  . Status asthmaticus 04/30/2015  . Acute respiratory failure with hypoxia ([place]) 04/30/2015  . Dyspnea  04/30/2015  . Hyponatremia, severe 04/30/2015  . Lumbar spondylosis 04/27/2011  . Screening for colon cancer 01/17/2011  . Abnormal EKG 01/17/2011  . Tobacco abuse 01/17/2011  . Tobacco abuse counseling 01/17/2011  . Hypertension   . Chronic low back pain    Past Medical History:  Diagnosis Date  . Anxiety    hx  . Arthritis    "lower back, hands" (05/01/2015)  . Chronic low back pain    disk s/p 4 diskectomies, 5th fursion, then spinal cord stimulator ([place])  . Depression   . Dysrhythmia    irruglar heart beat "nothing wrong"   . Fibromyalgia   . GERD (gastroesophageal reflux disease)   . Hypertension   . Pneumonia 05/01/2015  . PONV (postoperative nausea and vomiting)   . Rotator cuff disorder   . Seasonal asthma    "seasonal"  . Shortness of breath dyspnea   . Walking pneumonia     Family History  Problem Relation Age of Onset  . Stroke Father   . Hypertension Father   . Lung cancer Father     Past Surgical History:  Procedure Laterality Date  . BACK SURGERY    . CESAREAN SECTION  1986  . LUMBAR MICRODISCECTOMY  01/1990; 2003  . LUMBAR SPINE SURGERY  03/1991   "cleaned up scar tissue"  . POSTERIOR LUMBAR FUSION  2011; 04/27/2011  . REPAIR DURAL / CSF LEAK  02/1990  . SHOULDER ARTHROSCOPY Right 12/10/2019   Procedure: RIGHT SHOULDER ARTHROSCOPY AND DEBRIDEMENT;  Surgeon: Newt Minion, MD;  Location: [facility];  Service: Orthopedics;  Laterality: Right;  . SPINAL CORD STIMULATOR IMPLANT  2012  . SPINAL CORD STIMULATOR REMOVAL  08/2010  . TUBAL LIGATION  1986   Social History   Occupational History  . Not on file  Tobacco Use  . Smoking status: Current Every Day Smoker    Packs/day: 1.00    Years: 42.00    Pack years: 42.00    Types: Cigarettes  . Smokeless tobacco: Never Used  . Tobacco comment: prior trial of zyban made her feel weird, pt doesnt want info right now  Substance and Sexual Activity  . Alcohol use: No  . Drug use: Yes     Types: Marijuana    Comment: 05/01/2015 "I've used lots of multiple street drugs; nothing in the past couple years", "i use weed once a month"  . Sexual activity: Yes

## 2020-04-22 DIAGNOSIS — R768 Other specified abnormal immunological findings in serum: Secondary | ICD-10-CM | POA: Diagnosis not present

## 2020-04-23 DIAGNOSIS — G4700 Insomnia, unspecified: Secondary | ICD-10-CM | POA: Diagnosis not present

## 2020-04-23 DIAGNOSIS — Z79891 Long term (current) use of opiate analgesic: Secondary | ICD-10-CM | POA: Diagnosis not present

## 2020-05-04 DIAGNOSIS — Z0000 Encounter for general adult medical examination without abnormal findings: Secondary | ICD-10-CM | POA: Diagnosis not present

## 2020-05-04 DIAGNOSIS — R739 Hyperglycemia, unspecified: Secondary | ICD-10-CM | POA: Diagnosis not present

## 2020-05-07 DIAGNOSIS — G4700 Insomnia, unspecified: Secondary | ICD-10-CM | POA: Diagnosis not present

## 2020-05-07 DIAGNOSIS — R768 Other specified abnormal immunological findings in serum: Secondary | ICD-10-CM | POA: Diagnosis not present

## 2020-05-07 DIAGNOSIS — Z79891 Long term (current) use of opiate analgesic: Secondary | ICD-10-CM | POA: Diagnosis not present

## 2020-05-21 DIAGNOSIS — Z79891 Long term (current) use of opiate analgesic: Secondary | ICD-10-CM | POA: Diagnosis not present

## 2020-05-21 DIAGNOSIS — G4700 Insomnia, unspecified: Secondary | ICD-10-CM | POA: Diagnosis not present

## 2020-05-22 ENCOUNTER — Other Ambulatory Visit: Payer: Self-pay | Admitting: Physician Assistant

## 2020-06-04 DIAGNOSIS — Z79891 Long term (current) use of opiate analgesic: Secondary | ICD-10-CM | POA: Diagnosis not present

## 2020-06-04 DIAGNOSIS — G4700 Insomnia, unspecified: Secondary | ICD-10-CM | POA: Diagnosis not present

## 2020-06-14 ENCOUNTER — Other Ambulatory Visit: Payer: Self-pay | Admitting: Orthopedic Surgery

## 2020-06-18 DIAGNOSIS — Z79891 Long term (current) use of opiate analgesic: Secondary | ICD-10-CM | POA: Diagnosis not present

## 2020-06-18 DIAGNOSIS — G4700 Insomnia, unspecified: Secondary | ICD-10-CM | POA: Diagnosis not present

## 2020-07-02 DIAGNOSIS — Z79891 Long term (current) use of opiate analgesic: Secondary | ICD-10-CM | POA: Diagnosis not present

## 2020-07-02 DIAGNOSIS — G4700 Insomnia, unspecified: Secondary | ICD-10-CM | POA: Diagnosis not present

## 2020-07-06 DIAGNOSIS — M1990 Unspecified osteoarthritis, unspecified site: Secondary | ICD-10-CM | POA: Diagnosis not present

## 2020-07-06 DIAGNOSIS — Z1231 Encounter for screening mammogram for malignant neoplasm of breast: Secondary | ICD-10-CM | POA: Diagnosis not present

## 2020-07-06 DIAGNOSIS — K219 Gastro-esophageal reflux disease without esophagitis: Secondary | ICD-10-CM | POA: Diagnosis not present

## 2020-07-06 DIAGNOSIS — J449 Chronic obstructive pulmonary disease, unspecified: Secondary | ICD-10-CM | POA: Diagnosis not present

## 2020-07-17 DIAGNOSIS — Z79891 Long term (current) use of opiate analgesic: Secondary | ICD-10-CM | POA: Diagnosis not present

## 2020-07-17 DIAGNOSIS — G4700 Insomnia, unspecified: Secondary | ICD-10-CM | POA: Diagnosis not present

## 2020-07-30 DIAGNOSIS — Z79891 Long term (current) use of opiate analgesic: Secondary | ICD-10-CM | POA: Diagnosis not present

## 2020-07-30 DIAGNOSIS — G4700 Insomnia, unspecified: Secondary | ICD-10-CM | POA: Diagnosis not present

## 2020-08-20 DIAGNOSIS — G4700 Insomnia, unspecified: Secondary | ICD-10-CM | POA: Diagnosis not present

## 2020-08-20 DIAGNOSIS — Z79891 Long term (current) use of opiate analgesic: Secondary | ICD-10-CM | POA: Diagnosis not present

## 2020-09-03 DIAGNOSIS — Z79891 Long term (current) use of opiate analgesic: Secondary | ICD-10-CM | POA: Diagnosis not present

## 2020-09-03 DIAGNOSIS — G4700 Insomnia, unspecified: Secondary | ICD-10-CM | POA: Diagnosis not present

## 2020-09-17 DIAGNOSIS — Z79891 Long term (current) use of opiate analgesic: Secondary | ICD-10-CM | POA: Diagnosis not present

## 2020-09-17 DIAGNOSIS — G4700 Insomnia, unspecified: Secondary | ICD-10-CM | POA: Diagnosis not present

## 2020-09-21 ENCOUNTER — Other Ambulatory Visit: Payer: Self-pay | Admitting: Physician Assistant

## 2020-09-21 DIAGNOSIS — G8929 Other chronic pain: Secondary | ICD-10-CM

## 2020-09-28 DIAGNOSIS — M1990 Unspecified osteoarthritis, unspecified site: Secondary | ICD-10-CM | POA: Diagnosis not present

## 2020-09-28 DIAGNOSIS — E785 Hyperlipidemia, unspecified: Secondary | ICD-10-CM | POA: Diagnosis not present

## 2020-09-28 DIAGNOSIS — R739 Hyperglycemia, unspecified: Secondary | ICD-10-CM | POA: Diagnosis not present

## 2020-09-28 DIAGNOSIS — Z1211 Encounter for screening for malignant neoplasm of colon: Secondary | ICD-10-CM | POA: Diagnosis not present

## 2020-09-28 DIAGNOSIS — K219 Gastro-esophageal reflux disease without esophagitis: Secondary | ICD-10-CM | POA: Diagnosis not present

## 2020-09-28 DIAGNOSIS — J449 Chronic obstructive pulmonary disease, unspecified: Secondary | ICD-10-CM | POA: Diagnosis not present

## 2020-09-28 DIAGNOSIS — Z79899 Other long term (current) drug therapy: Secondary | ICD-10-CM | POA: Diagnosis not present

## 2020-10-01 DIAGNOSIS — G4700 Insomnia, unspecified: Secondary | ICD-10-CM | POA: Diagnosis not present

## 2020-10-01 DIAGNOSIS — Z79891 Long term (current) use of opiate analgesic: Secondary | ICD-10-CM | POA: Diagnosis not present

## 2020-10-15 DIAGNOSIS — Z79891 Long term (current) use of opiate analgesic: Secondary | ICD-10-CM | POA: Diagnosis not present

## 2020-10-15 DIAGNOSIS — G4700 Insomnia, unspecified: Secondary | ICD-10-CM | POA: Diagnosis not present

## 2020-10-29 DIAGNOSIS — Z79891 Long term (current) use of opiate analgesic: Secondary | ICD-10-CM | POA: Diagnosis not present

## 2020-10-29 DIAGNOSIS — G4700 Insomnia, unspecified: Secondary | ICD-10-CM | POA: Diagnosis not present

## 2020-11-19 DIAGNOSIS — J45909 Unspecified asthma, uncomplicated: Secondary | ICD-10-CM | POA: Diagnosis not present

## 2020-11-19 DIAGNOSIS — G4700 Insomnia, unspecified: Secondary | ICD-10-CM | POA: Diagnosis not present

## 2020-11-19 DIAGNOSIS — Z79891 Long term (current) use of opiate analgesic: Secondary | ICD-10-CM | POA: Diagnosis not present

## 2020-11-24 DIAGNOSIS — I10 Essential (primary) hypertension: Secondary | ICD-10-CM | POA: Diagnosis not present

## 2020-11-24 DIAGNOSIS — J449 Chronic obstructive pulmonary disease, unspecified: Secondary | ICD-10-CM | POA: Diagnosis not present

## 2020-11-24 DIAGNOSIS — K219 Gastro-esophageal reflux disease without esophagitis: Secondary | ICD-10-CM | POA: Diagnosis not present

## 2020-11-24 DIAGNOSIS — Z23 Encounter for immunization: Secondary | ICD-10-CM | POA: Diagnosis not present

## 2020-12-03 DIAGNOSIS — J449 Chronic obstructive pulmonary disease, unspecified: Secondary | ICD-10-CM | POA: Diagnosis not present

## 2020-12-03 DIAGNOSIS — R739 Hyperglycemia, unspecified: Secondary | ICD-10-CM | POA: Diagnosis not present

## 2020-12-03 DIAGNOSIS — E785 Hyperlipidemia, unspecified: Secondary | ICD-10-CM | POA: Diagnosis not present

## 2020-12-03 DIAGNOSIS — I10 Essential (primary) hypertension: Secondary | ICD-10-CM | POA: Diagnosis not present

## 2020-12-03 DIAGNOSIS — M1990 Unspecified osteoarthritis, unspecified site: Secondary | ICD-10-CM | POA: Diagnosis not present

## 2020-12-03 DIAGNOSIS — K219 Gastro-esophageal reflux disease without esophagitis: Secondary | ICD-10-CM | POA: Diagnosis not present

## 2020-12-03 DIAGNOSIS — G4700 Insomnia, unspecified: Secondary | ICD-10-CM | POA: Diagnosis not present

## 2021-01-04 DIAGNOSIS — Z87891 Personal history of nicotine dependence: Secondary | ICD-10-CM | POA: Diagnosis not present

## 2021-01-04 DIAGNOSIS — Z79891 Long term (current) use of opiate analgesic: Secondary | ICD-10-CM | POA: Diagnosis not present

## 2021-01-25 DIAGNOSIS — I10 Essential (primary) hypertension: Secondary | ICD-10-CM | POA: Diagnosis not present

## 2021-01-25 DIAGNOSIS — K219 Gastro-esophageal reflux disease without esophagitis: Secondary | ICD-10-CM | POA: Diagnosis not present

## 2021-01-25 DIAGNOSIS — E785 Hyperlipidemia, unspecified: Secondary | ICD-10-CM | POA: Diagnosis not present

## 2021-01-25 DIAGNOSIS — R739 Hyperglycemia, unspecified: Secondary | ICD-10-CM | POA: Diagnosis not present

## 2021-01-25 DIAGNOSIS — R0981 Nasal congestion: Secondary | ICD-10-CM | POA: Diagnosis not present

## 2021-01-25 DIAGNOSIS — Z79899 Other long term (current) drug therapy: Secondary | ICD-10-CM | POA: Diagnosis not present

## 2021-02-02 DIAGNOSIS — E785 Hyperlipidemia, unspecified: Secondary | ICD-10-CM | POA: Diagnosis not present

## 2021-02-02 DIAGNOSIS — Z0000 Encounter for general adult medical examination without abnormal findings: Secondary | ICD-10-CM | POA: Diagnosis not present

## 2021-02-02 DIAGNOSIS — Z9181 History of falling: Secondary | ICD-10-CM | POA: Diagnosis not present

## 2021-02-11 DIAGNOSIS — G4700 Insomnia, unspecified: Secondary | ICD-10-CM | POA: Diagnosis not present

## 2021-02-11 DIAGNOSIS — Z79891 Long term (current) use of opiate analgesic: Secondary | ICD-10-CM | POA: Diagnosis not present

## 2021-02-11 DIAGNOSIS — J45909 Unspecified asthma, uncomplicated: Secondary | ICD-10-CM | POA: Diagnosis not present

## 2021-02-25 DIAGNOSIS — Z79891 Long term (current) use of opiate analgesic: Secondary | ICD-10-CM | POA: Diagnosis not present

## 2021-02-25 DIAGNOSIS — Z87891 Personal history of nicotine dependence: Secondary | ICD-10-CM | POA: Diagnosis not present

## 2021-03-02 ENCOUNTER — Ambulatory Visit: Payer: Medicare Other | Admitting: Orthopedic Surgery

## 2021-03-02 DIAGNOSIS — R69 Illness, unspecified: Secondary | ICD-10-CM | POA: Diagnosis not present

## 2021-03-10 ENCOUNTER — Ambulatory Visit: Payer: Medicaid Other | Admitting: Family

## 2021-03-18 DIAGNOSIS — R69 Illness, unspecified: Secondary | ICD-10-CM | POA: Diagnosis not present

## 2021-03-19 DIAGNOSIS — R69 Illness, unspecified: Secondary | ICD-10-CM | POA: Diagnosis not present

## 2021-03-25 ENCOUNTER — Other Ambulatory Visit: Payer: Self-pay

## 2021-03-25 ENCOUNTER — Ambulatory Visit (INDEPENDENT_AMBULATORY_CARE_PROVIDER_SITE_OTHER): Payer: Commercial Managed Care - HMO | Admitting: Orthopedic Surgery

## 2021-03-25 DIAGNOSIS — M25512 Pain in left shoulder: Secondary | ICD-10-CM | POA: Diagnosis not present

## 2021-03-25 DIAGNOSIS — Z79891 Long term (current) use of opiate analgesic: Secondary | ICD-10-CM | POA: Diagnosis not present

## 2021-03-25 DIAGNOSIS — G8929 Other chronic pain: Secondary | ICD-10-CM

## 2021-03-25 DIAGNOSIS — M25511 Pain in right shoulder: Secondary | ICD-10-CM | POA: Diagnosis not present

## 2021-03-25 DIAGNOSIS — Z87891 Personal history of nicotine dependence: Secondary | ICD-10-CM | POA: Diagnosis not present

## 2021-03-28 ENCOUNTER — Encounter: Payer: Self-pay | Admitting: Orthopedic Surgery

## 2021-03-28 DIAGNOSIS — M25511 Pain in right shoulder: Secondary | ICD-10-CM | POA: Diagnosis not present

## 2021-03-28 NOTE — Progress Notes (Signed)
Office Visit Note   Patient: Madeline Dickerson           Date of Birth: 08-04-1960           MRN: [id number] Visit Date: 03/25/2021              Requested by: Helen Hashimoto., MD [address] [place],  [place] [address] PCP: Helen Hashimoto., MD  Chief Complaint  Patient presents with   Left Shoulder - Pain      HPI: Patient is a 61 year old woman who presents with chronic bilateral shoulder pain.  She is status post right shoulder arthroscopy in 2021.  Patient states she has had cervical spine surgery with Dr. Christella Noa.  Assessment & Plan: Visit Diagnoses:  1. Chronic pain of both shoulders     Plan: Patient is given a prescription for a Rollator.  Both shoulders were injected.  Recommended following up with Dr. Christella Noa for her neck symptoms.  Follow-Up Instructions: Return if symptoms worsen or fail to improve.   Ortho Exam  Patient is alert, oriented, no adenopathy, well-dressed, normal affect, normal respiratory effort. Examination of both shoulders she has abduction and flexion to 100 degrees she has pain with Neer and Hawkins impingement test pain to palpation over the biceps bilaterally.  Imaging: No results found. No images are attached to the encounter.  Labs: Lab Results  Component Value Date   HGBA1C 6.0 (H) 12/26/2015   HGBA1C 6.0 04/01/2011   REPTSTATUS 12/30/2015 FINAL 12/27/2015   GRAMSTAIN  12/27/2015    MODERATE WBC PRESENT,BOTH PMN AND MONONUCLEAR RARE SQUAMOUS EPITHELIAL CELLS PRESENT FEW GRAM POSITIVE COCCI IN PAIRS RARE BUDDING YEAST SEEN    CULT  12/27/2015    Consistent with normal respiratory flora. Performed at [facility] 04/30/2015     Lab Results  Component Value Date   ALBUMIN 4.1 10/09/2019   ALBUMIN 4.2 05/28/2017   ALBUMIN 4.1 11/29/2016    Lab Results  Component Value Date   MG 1.4 (L) 09/19/2016   MG 1.6 (L) 06/17/2016   MG 1.6 (L) 12/26/2015   No results  found for: VD25OH  No results found for: PREALBUMIN CBC EXTENDED Latest Ref Rng & Units 10/09/2019 05/28/2017 11/29/2016  WBC 4.0 - 10.5 K/uL 14.4(H) 14.4(H) 15.3(H)  RBC 3.87 - 5.11 MIL/uL 4.69 4.79 4.02  HGB 12.0 - 15.0 g/dL 14.3 14.6 12.1  HCT 36.0 - 46.0 % 43.6 42.3 35.7(L)  PLT 150 - 400 K/uL 407(H) 314 397  NEUTROABS 1.7 - 7.7 K/uL - - 12.8(H)  LYMPHSABS 0.7 - 4.0 K/uL - - 1.4     There is no height or weight on file to calculate BMI.  Orders:  No orders of the defined types were placed in this encounter.  No orders of the defined types were placed in this encounter.    Procedures: Large Joint Inj: bilateral subacromial bursa on 03/28/2021 8:05 PM Indications: diagnostic evaluation and pain Details: 22 G 1.5 in needle, posterior approach  Arthrogram: No  Outcome: tolerated well, no immediate complications Procedure, treatment alternatives, risks and benefits explained, specific risks discussed. Consent was given by the patient. Immediately prior to procedure a time out was called to verify the correct patient, procedure, equipment, support staff and site/side marked as required. Patient was prepped and draped in the usual sterile fashion.     Clinical Data: No additional findings.  ROS:  All other systems negative, except as  noted in the HPI. Review of Systems  Objective: Vital Signs: LMP 08/28/2012   Specialty Comments:  No specialty comments available.  PMFS History: Patient Active Problem List   Diagnosis Date Noted   Nontraumatic complete tear of right rotator cuff    Impingement syndrome of right shoulder 12/01/2016   Impingement syndrome of left shoulder 12/01/2016   HNP (herniated nucleus pulposus), lumbar 05/05/2016   Hyperkalemia 12/28/2015   COPD exacerbation ([place]) 12/25/2015   Anemia 12/25/2015   Shortness of breath 12/25/2015   Cardiomegaly 12/25/2015   Bipolar 1 disorder, mixed, moderate ([place]) 06/03/2015   CAP (community acquired pneumonia)  04/30/2015   Pneumonitis 04/30/2015   Status asthmaticus 04/30/2015   Acute respiratory failure with hypoxia ([place]) 04/30/2015   Dyspnea 04/30/2015   Hyponatremia, severe 04/30/2015   Lumbar spondylosis 04/27/2011   Screening for colon cancer 01/17/2011   Abnormal EKG 01/17/2011   Tobacco abuse 01/17/2011   Tobacco abuse counseling 01/17/2011   Hypertension    Chronic low back pain    Past Medical History:  Diagnosis Date   Anxiety    hx   Arthritis    "lower back, hands" (05/01/2015)   Chronic low back pain    disk s/p 4 diskectomies, 5th fursion, then spinal cord stimulator ([place])   Depression    Dysrhythmia    irruglar heart beat "nothing wrong"    Fibromyalgia    GERD (gastroesophageal reflux disease)    Hypertension    Pneumonia 05/01/2015   PONV (postoperative nausea and vomiting)    Rotator cuff disorder    Seasonal asthma    "seasonal"   Shortness of breath dyspnea    Walking pneumonia     Family History  Problem Relation Age of Onset   Stroke Father    Hypertension Father    Lung cancer Father     Past Surgical History:  Procedure Laterality Date   [place] MICRODISCECTOMY  01/1990; 2003   LUMBAR SPINE SURGERY  03/1991   "cleaned up scar tissue"   POSTERIOR LUMBAR FUSION  2011; 04/27/2011   REPAIR DURAL / CSF LEAK  02/1990   SHOULDER ARTHROSCOPY Right 12/10/2019   Procedure: RIGHT SHOULDER ARTHROSCOPY AND DEBRIDEMENT;  Surgeon: Newt Minion, MD;  Location: [facility];  Service: Orthopedics;  Laterality: Right;   SPINAL CORD STIMULATOR IMPLANT  2012   SPINAL CORD STIMULATOR REMOVAL  08/2010   TUBAL LIGATION  1986   Social History   Occupational History   Not on file  Tobacco Use   Smoking status: Every Day    Packs/day: 1.00    Years: 42.00    Pack years: 42.00    Types: Cigarettes   Smokeless tobacco: Never   Tobacco comments:    prior trial of zyban made her feel weird, pt doesnt want  info right now  Substance and Sexual Activity   Alcohol use: No   Drug use: Yes    Types: Marijuana    Comment: 05/01/2015 "I've used lots of multiple street drugs; nothing in the past couple years", "i use weed once a month"   Sexual activity: Yes

## 2021-03-29 ENCOUNTER — Telehealth: Payer: Self-pay

## 2021-03-29 NOTE — Telephone Encounter (Signed)
Patient called in stating that she can not find a rollator. She is stating that a lot of places are out of stock. She wanted to know if we knew of a place she could find one ?

## 2021-03-30 NOTE — Telephone Encounter (Signed)
Pt informed we will send in order for rollator walker through online submission.

## 2021-03-31 DIAGNOSIS — M25512 Pain in left shoulder: Secondary | ICD-10-CM | POA: Diagnosis not present

## 2021-03-31 DIAGNOSIS — M25511 Pain in right shoulder: Secondary | ICD-10-CM | POA: Diagnosis not present

## 2021-03-31 DIAGNOSIS — Z79891 Long term (current) use of opiate analgesic: Secondary | ICD-10-CM | POA: Diagnosis not present

## 2021-04-06 DIAGNOSIS — Z79891 Long term (current) use of opiate analgesic: Secondary | ICD-10-CM | POA: Diagnosis not present

## 2021-04-14 DIAGNOSIS — Z79891 Long term (current) use of opiate analgesic: Secondary | ICD-10-CM | POA: Diagnosis not present

## 2021-04-14 DIAGNOSIS — Z87891 Personal history of nicotine dependence: Secondary | ICD-10-CM | POA: Diagnosis not present

## 2021-04-19 DIAGNOSIS — I10 Essential (primary) hypertension: Secondary | ICD-10-CM | POA: Diagnosis not present

## 2021-04-19 DIAGNOSIS — G4700 Insomnia, unspecified: Secondary | ICD-10-CM | POA: Diagnosis not present

## 2021-05-06 ENCOUNTER — Telehealth: Payer: Self-pay | Admitting: Orthopedic Surgery

## 2021-05-06 NOTE — Telephone Encounter (Signed)
Pt called requesting a call back about what she can do about her pains. Pt had cortisone injections 04/14/21. Pt also states she has been running a low fever and had cold symptoms with vomiting. Please call pt about shoulder pains. Phone number is [phone]. ?

## 2021-05-07 NOTE — Telephone Encounter (Signed)
Called the pat and advised she should contact her PCP for her cold s/s. We sch an appt for 05/17/21 for possible repeat injections bilateral shoulders. She states that she has had that before in the past and that sometimes it took several injections and then it would last for several months. Can discuss at visit and will call with any questions.  ?

## 2021-05-17 ENCOUNTER — Ambulatory Visit: Payer: Medicare Other | Admitting: Orthopedic Surgery

## 2021-06-03 ENCOUNTER — Encounter (HOSPITAL_COMMUNITY): Payer: Self-pay

## 2021-06-03 ENCOUNTER — Inpatient Hospital Stay (HOSPITAL_COMMUNITY)
Admission: EM | Admit: 2021-06-03 | Discharge: 2021-06-09 | DRG: 871 | Disposition: A | Discharge status: Home / Self Care | Payer: Medicare Other | Attending: Internal Medicine | Admitting: Internal Medicine

## 2021-06-03 ENCOUNTER — Emergency Department (HOSPITAL_COMMUNITY): Payer: Medicare Other

## 2021-06-03 ENCOUNTER — Other Ambulatory Visit: Payer: Self-pay

## 2021-06-03 DIAGNOSIS — M19042 Primary osteoarthritis, left hand: Secondary | ICD-10-CM | POA: Diagnosis present

## 2021-06-03 DIAGNOSIS — R531 Weakness: Secondary | ICD-10-CM | POA: Diagnosis not present

## 2021-06-03 DIAGNOSIS — D509 Iron deficiency anemia, unspecified: Secondary | ICD-10-CM | POA: Diagnosis not present

## 2021-06-03 DIAGNOSIS — E876 Hypokalemia: Secondary | ICD-10-CM | POA: Diagnosis present

## 2021-06-03 DIAGNOSIS — M545 Low back pain, unspecified: Secondary | ICD-10-CM | POA: Diagnosis not present

## 2021-06-03 DIAGNOSIS — A419 Sepsis, unspecified organism: Secondary | ICD-10-CM | POA: Diagnosis not present

## 2021-06-03 DIAGNOSIS — Z20822 Contact with and (suspected) exposure to covid-19: Secondary | ICD-10-CM | POA: Diagnosis present

## 2021-06-03 DIAGNOSIS — D122 Benign neoplasm of ascending colon: Secondary | ICD-10-CM | POA: Diagnosis not present

## 2021-06-03 DIAGNOSIS — R0600 Dyspnea, unspecified: Principal | ICD-10-CM

## 2021-06-03 DIAGNOSIS — J441 Chronic obstructive pulmonary disease with (acute) exacerbation: Secondary | ICD-10-CM | POA: Diagnosis not present

## 2021-06-03 DIAGNOSIS — J440 Chronic obstructive pulmonary disease with acute lower respiratory infection: Secondary | ICD-10-CM | POA: Diagnosis present

## 2021-06-03 DIAGNOSIS — Z7951 Long term (current) use of inhaled steroids: Secondary | ICD-10-CM

## 2021-06-03 DIAGNOSIS — G4700 Insomnia, unspecified: Secondary | ICD-10-CM | POA: Diagnosis not present

## 2021-06-03 DIAGNOSIS — G8929 Other chronic pain: Secondary | ICD-10-CM | POA: Diagnosis not present

## 2021-06-03 DIAGNOSIS — I5032 Chronic diastolic (congestive) heart failure: Secondary | ICD-10-CM | POA: Diagnosis not present

## 2021-06-03 DIAGNOSIS — F3162 Bipolar disorder, current episode mixed, moderate: Secondary | ICD-10-CM | POA: Diagnosis present

## 2021-06-03 DIAGNOSIS — R5381 Other malaise: Secondary | ICD-10-CM | POA: Diagnosis present

## 2021-06-03 DIAGNOSIS — K449 Diaphragmatic hernia without obstruction or gangrene: Secondary | ICD-10-CM | POA: Diagnosis present

## 2021-06-03 DIAGNOSIS — K2100 Gastro-esophageal reflux disease with esophagitis, without bleeding: Secondary | ICD-10-CM | POA: Diagnosis present

## 2021-06-03 DIAGNOSIS — N179 Acute kidney failure, unspecified: Secondary | ICD-10-CM | POA: Diagnosis not present

## 2021-06-03 DIAGNOSIS — R0602 Shortness of breath: Secondary | ICD-10-CM | POA: Diagnosis not present

## 2021-06-03 DIAGNOSIS — Z981 Arthrodesis status: Secondary | ICD-10-CM

## 2021-06-03 DIAGNOSIS — J9601 Acute respiratory failure with hypoxia: Secondary | ICD-10-CM | POA: Diagnosis present

## 2021-06-03 DIAGNOSIS — R6521 Severe sepsis with septic shock: Secondary | ICD-10-CM | POA: Diagnosis not present

## 2021-06-03 DIAGNOSIS — M47816 Spondylosis without myelopathy or radiculopathy, lumbar region: Secondary | ICD-10-CM | POA: Diagnosis present

## 2021-06-03 DIAGNOSIS — F419 Anxiety disorder, unspecified: Secondary | ICD-10-CM | POA: Diagnosis present

## 2021-06-03 DIAGNOSIS — E871 Hypo-osmolality and hyponatremia: Secondary | ICD-10-CM | POA: Diagnosis not present

## 2021-06-03 DIAGNOSIS — M797 Fibromyalgia: Secondary | ICD-10-CM | POA: Diagnosis present

## 2021-06-03 DIAGNOSIS — F17210 Nicotine dependence, cigarettes, uncomplicated: Secondary | ICD-10-CM | POA: Diagnosis present

## 2021-06-03 DIAGNOSIS — R195 Other fecal abnormalities: Secondary | ICD-10-CM | POA: Diagnosis present

## 2021-06-03 DIAGNOSIS — I110 Hypertensive heart disease with heart failure: Secondary | ICD-10-CM | POA: Diagnosis present

## 2021-06-03 DIAGNOSIS — K635 Polyp of colon: Secondary | ICD-10-CM | POA: Diagnosis not present

## 2021-06-03 DIAGNOSIS — J189 Pneumonia, unspecified organism: Secondary | ICD-10-CM | POA: Diagnosis present

## 2021-06-03 DIAGNOSIS — M19041 Primary osteoarthritis, right hand: Secondary | ICD-10-CM | POA: Diagnosis not present

## 2021-06-03 DIAGNOSIS — Z8249 Family history of ischemic heart disease and other diseases of the circulatory system: Secondary | ICD-10-CM

## 2021-06-03 DIAGNOSIS — D649 Anemia, unspecified: Secondary | ICD-10-CM | POA: Diagnosis present

## 2021-06-03 DIAGNOSIS — E860 Dehydration: Secondary | ICD-10-CM | POA: Diagnosis present

## 2021-06-03 DIAGNOSIS — R079 Chest pain, unspecified: Secondary | ICD-10-CM | POA: Diagnosis not present

## 2021-06-03 DIAGNOSIS — I10 Essential (primary) hypertension: Secondary | ICD-10-CM | POA: Diagnosis present

## 2021-06-03 DIAGNOSIS — R42 Dizziness and giddiness: Secondary | ICD-10-CM | POA: Diagnosis not present

## 2021-06-03 DIAGNOSIS — Z888 Allergy status to other drugs, medicaments and biological substances status: Secondary | ICD-10-CM

## 2021-06-03 DIAGNOSIS — Z79899 Other long term (current) drug therapy: Secondary | ICD-10-CM

## 2021-06-03 LAB — BASIC METABOLIC PANEL
Anion gap: 14 (ref 5–15)
BUN: 37 mg/dL — ABNORMAL HIGH (ref 8–23)
CO2: 20 mmol/L — ABNORMAL LOW (ref 22–32)
Calcium: 8.9 mg/dL (ref 8.9–10.3)
Chloride: 90 mmol/L — ABNORMAL LOW (ref 98–111)
Creatinine, Ser: 1.42 mg/dL — ABNORMAL HIGH (ref 0.44–1.00)
GFR, Estimated: 42 mL/min — ABNORMAL LOW (ref >60)
Glucose, Bld: 107 mg/dL — ABNORMAL HIGH (ref 70–99)
Potassium: 3.3 mmol/L — ABNORMAL LOW (ref 3.5–5.1)
Sodium: 124 mmol/L — ABNORMAL LOW (ref 135–145)

## 2021-06-03 LAB — I-STAT CHEM 8, ED
BUN: 36 mg/dL — ABNORMAL HIGH (ref 8–23)
Calcium, Ion: 1.08 mmol/L — ABNORMAL LOW (ref 1.15–1.40)
Chloride: 93 mmol/L — ABNORMAL LOW (ref 98–111)
Creatinine, Ser: 1.5 mg/dL — ABNORMAL HIGH (ref 0.44–1.00)
Glucose, Bld: 103 mg/dL — ABNORMAL HIGH (ref 70–99)
HCT: 37 % (ref 36.0–46.0)
Hemoglobin: 12.6 g/dL (ref 12.0–15.0)
Potassium: 3.3 mmol/L — ABNORMAL LOW (ref 3.5–5.1)
Sodium: 125 mmol/L — ABNORMAL LOW (ref 135–145)
TCO2: 23 mmol/L (ref 22–32)

## 2021-06-03 LAB — CBC
HCT: 33.9 % — ABNORMAL LOW (ref 36.0–46.0)
Hemoglobin: 10.7 g/dL — ABNORMAL LOW (ref 12.0–15.0)
MCH: 29.6 pg (ref 26.0–34.0)
MCHC: 31.6 g/dL (ref 30.0–36.0)
MCV: 93.9 fL (ref 80.0–100.0)
Platelets: 701 10*3/uL — ABNORMAL HIGH (ref 150–400)
RBC: 3.61 MIL/uL — ABNORMAL LOW (ref 3.87–5.11)
RDW: 14.8 % (ref 11.5–15.5)
WBC: 27.4 10*3/uL — ABNORMAL HIGH (ref 4.0–10.5)
nRBC: 0 % (ref 0.0–0.2)

## 2021-06-03 LAB — TROPONIN I (HIGH SENSITIVITY): Troponin I (High Sensitivity): 4 ng/L (ref <18)

## 2021-06-03 LAB — BRAIN NATRIURETIC PEPTIDE: B Natriuretic Peptide: 368.7 pg/mL — ABNORMAL HIGH (ref 0.0–100.0)

## 2021-06-03 LAB — LACTIC ACID, PLASMA: Lactic Acid, Venous: 2.3 mmol/L (ref 0.5–1.9)

## 2021-06-03 MED ORDER — LACTATED RINGERS IV BOLUS
1000.0000 mL | Freq: Once | INTRAVENOUS | Status: AC
  Administered 2021-06-04: 1000 mL via INTRAVENOUS

## 2021-06-03 MED ORDER — LACTATED RINGERS IV BOLUS
1000.0000 mL | Freq: Once | INTRAVENOUS | Status: AC
  Administered 2021-06-03: 1000 mL via INTRAVENOUS

## 2021-06-03 MED ORDER — SODIUM CHLORIDE 0.9 % IV SOLN
500.0000 mg | Freq: Once | INTRAVENOUS | Status: AC
Start: 2021-06-03 — End: 2021-06-04
  Administered 2021-06-04: 500 mg via INTRAVENOUS
  Filled 2021-06-03: qty 5

## 2021-06-03 MED ORDER — POTASSIUM CHLORIDE 10 MEQ/100ML IV SOLN
10.0000 meq | Freq: Once | INTRAVENOUS | Status: AC
Start: 2021-06-04 — End: 2021-06-04
  Administered 2021-06-04: 10 meq via INTRAVENOUS
  Filled 2021-06-03: qty 100

## 2021-06-03 MED ORDER — NOREPINEPHRINE 4 MG/250ML-% IV SOLN
0.0000 ug/min | INTRAVENOUS | Status: DC
  Filled 2021-06-03: qty 250

## 2021-06-03 MED ORDER — SODIUM CHLORIDE 0.9 % IV SOLN
2.0000 g | Freq: Once | INTRAVENOUS | Status: AC
  Administered 2021-06-03: 2 g via INTRAVENOUS

## 2021-06-03 NOTE — ED Notes (Signed)
Unable to get labs with IV start ?

## 2021-06-03 NOTE — ED Notes (Signed)
Triage rn notified of blood pressure ?

## 2021-06-03 NOTE — ED Notes (Signed)
EDP aware of inability to collect blood cultures ?

## 2021-06-03 NOTE — ED Notes (Signed)
Pt complaint of sickness x23mowith productive cough, SHOB, weakness, diarrhea, and several syncopal like episodes that almost resulted in a fall. Pt endorsed that s/s have been getting progressive worse over the past week. Pt stated they have been taking mucinex for cough but it has not helped, phlegm is yellow and brown with "an awful taste". Pt also C/O R ear pain that feels "full of something". ?

## 2021-06-03 NOTE — ED Triage Notes (Signed)
Pt reports SOB "my breathing is bad" and she also reports feeling very weak ongoing for 3 weeks and states it is just getting worse associated with shooting pains to right side of chest. Productive cough of yellow phlegm. ?

## 2021-06-03 NOTE — ED Provider Notes (Signed)
?Patient is a 61 y.o. female with a history hypertension, COPD, anemia, presents to the emergency department for 3 weeks of progressively worsening shortness of breath associated with cough and green sputum production.  Patient vital signs remarkable for hypotension in the 86V systolic.  Patient was oxygenating anywhere between low 90s to mid 90s.  Lung exam remarkable for decreased air movement on the right upper lung field with crackles.  No obvious wheezing.  Patient appears uncomfortable and fatigued. ? ?Patient presentation is concerning for sepsis secondary to CAP.  Other differential include ACS etiology, CHF, COPD exacerbation, versus PE.  ? ?Intervention: I have immediately started patient on 30 cc/kg of fluid and antibiotic including Rocephin and azithromycin.  ? ?Patient worked up did show a leukocytosis of 27.4.  Her hemoglobin and hematocrit are fairly stable.  BMP remarkable for hyponatremia at 124 and potassium of 3.3 (replenish potassium).  She does also have hypochloremia and mild elevation in her creatinine at 1.42.  Patient lactic elevated at 2.3.  BNP elevated at 368.  Troponin, blood cultures and repeat lactic are pending. In addition respiratory panel including COVID and flu are pending.  Patient chest x-ray remarkable for patchy airspace disease that is concerning for right-sided pneumonia.  Patient again did show sinus tachycardia.  No ischemic changes.  No Brugada or Wellens. ? ?On reassessment, patient blood pressure continue to worsen despite fluid resuscitation.  We will start her on Levophed and titrate to a goal of MAP was greater than 65.  Patient will require level of care.  I communicated with the intensivist for admission.  Per the intensivist, they would like to reassess in 30 minutes as her Levophed stopped and started and patient blood pressure is improving after 30 cc/kg fluid resuscitation. ? ?-Patient care has been passed on to the oncoming provider.  Plan at signout include  following up with the admission team.  Please see the oncoming providers note for further detail. ? ?Care of this patient was overseen by the attending physician of record for this patient who verbalized agreement with the plan. ? ?I performed chart review as discussed in the MDM ? ?Medical Decision Making ?Problems Addressed: ?Community acquired pneumonia of right lung, unspecified part of lung: acute illness or injury that poses a threat to life or bodily functions ?Dyspnea, unspecified type: acute illness or injury that poses a threat to life or bodily functions ?Sepsis, due to unspecified organism, unspecified whether acute organ dysfunction present [facility]): acute illness or injury that poses a threat to life or bodily functions ? ?Amount and/or Complexity of Data Reviewed ?External Data Reviewed: notes. ?Labs: ordered. Decision-making details documented in ED Course. ?Radiology: ordered and independent interpretation performed. Decision-making details documented in ED Course. ?ECG/medicine tests: ordered and independent interpretation performed. Decision-making details documented in ED Course. ? ?Risk ?Prescription drug management. ?Decision regarding hospitalization. ? ? ? ?HPI: ? ?Patient is a 61 y.o. female with a history hypertension, COPD, anemia, presents to the emergency department for 3 weeks of progressively worsening shortness of breath associated with cough and green sputum production.  She reports she has been having cough that has been progressively worsening.  She did home COVID test but they were negative.  Patient continued to be fatigued with chills which brought her to the emergency department.  She does not have associated chest pain.  Denies associated emesis or diarrhea.  Denies any urinary symptoms.  Denies abdominal pain.  She reports that no intervention prior to coming to the emergency  department.  She does report COPD history and takes albuterol and other inhalers.  Denies being a  smoker.  Denies any recreational drug use.  Otherwise no other complaints. ? ?ROS included in the HPI. ?Please see MAR for past medical history, surgical history, and social history. ? ?Past Medical History:  ?Diagnosis Date  ? Anxiety   ? hx  ? Arthritis   ? "lower back, hands" (05/01/2015)  ? Chronic low back pain   ? disk s/p 4 diskectomies, 5th fursion, then spinal cord stimulator ([facility])  ? Depression   ? Dysrhythmia   ? irruglar heart beat "nothing wrong"   ? Fibromyalgia   ? GERD (gastroesophageal reflux disease)   ? Hypertension   ? Pneumonia 05/01/2015  ? PONV (postoperative nausea and vomiting)   ? Rotator cuff disorder   ? Seasonal asthma   ? "seasonal"  ? Shortness of breath dyspnea   ? Walking pneumonia   ? ? ?Vitals:  ? 06/03/21 2135 06/03/21 2300  ?BP: (!) 88/53 (!) 90/52  ?Pulse:  86  ?Resp:  20  ?Temp:    ?SpO2:  91%  ? ? ?Physical Exam: ?General: Ill-appearing, in acute distress  ?head: Normocephalic Atraumatic; ?Eyes: PERRL, EOMI; ?ENT: Airway patent, no stridor; uvula midline ?Neck: supple, no meningismus; trachea midline; ?Chest: Lung crackles on the right upper field, air movement decreased, good air movement on the left side no wheezing or crackle on the left side  ?cardiac: Regular rate and rhythm, 2+pulses bilaterally in upper and lower extremities ?Abdomen: soft, nontender, nondistended; no guarding, rebound, or tenderness to percussion; ?Musculoskeletal:  Good muscle tone in upper and lower extremities, 5/5 strength ?Skin: No rash, normal skin tone; no clubbing, no cyanosis, no edema ?Neuro: Alert and oriented x3; No focal deficit, CN 2-12 symmetric and grossly intact; sensation intact in upper and lower extremities ? ? ? ?Procedures ? ?DG Chest 2 View  ?Final Result  ?  ? ? ?Clinical Impression:  ?1. Dyspnea, unspecified type   ?2. Community acquired pneumonia of right lung, unspecified part of lung   ?3. Sepsis, due to unspecified organism, unspecified whether acute organ dysfunction  present [facility])   ? ? ?Admit ? ? ?  ?Donnamarie Poag, MD ?06/04/21 0031 ? ?  ?Lucrezia Starch, MD ?06/04/21 1608 ? ?

## 2021-06-03 NOTE — ED Provider Triage Note (Signed)
Emergency Medicine Provider Triage Evaluation Note ? ?Madeline Dickerson , a 61 y.o. female  was evaluated in triage.  Pt complains of SOB for the past three weeks. Reports occasional chest pain. Denies any fevers.  ? ?Review of Systems  ?Positive:  ?Negative:  ? ?Physical Exam  ?BP (!) 81/56   Pulse 88   Temp 98.8 ?F (37.1 ?C) (Oral)   Resp 16   LMP 08/28/2012   SpO2 100%  ?Gen:   Awake, no distress, pale   ?Resp:  Tachypnea, diminshed breath sounds ?MSK:   Moves extremities without difficulty  ?Other:   ? ?Medical Decision Making  ?Medically screening exam initiated at 9:29 PM.  Appropriate orders placed.  AMALIA EDGECOMBE was informed that the remainder of the evaluation will be completed by another provider, this initial triage assessment does not replace that evaluation, and the importance of remaining in the ED until their evaluation is complete. ? ?The patient is not well appearing and has soft pressures. Will need to be roomed now. ?  ?Sherrell Puller, PA-C ?06/03/21 2133 ? ?

## 2021-06-03 NOTE — ED Notes (Signed)
NT updated pts VS and pts BP was 81/56, triage RN notified and aware. Pt was brought back to triage nurse.  ?

## 2021-06-04 ENCOUNTER — Encounter (HOSPITAL_COMMUNITY): Payer: Self-pay | Admitting: Internal Medicine

## 2021-06-04 DIAGNOSIS — F17210 Nicotine dependence, cigarettes, uncomplicated: Secondary | ICD-10-CM | POA: Diagnosis present

## 2021-06-04 DIAGNOSIS — D509 Iron deficiency anemia, unspecified: Secondary | ICD-10-CM | POA: Diagnosis not present

## 2021-06-04 DIAGNOSIS — G4700 Insomnia, unspecified: Secondary | ICD-10-CM | POA: Diagnosis present

## 2021-06-04 DIAGNOSIS — J189 Pneumonia, unspecified organism: Secondary | ICD-10-CM

## 2021-06-04 DIAGNOSIS — A419 Sepsis, unspecified organism: Secondary | ICD-10-CM

## 2021-06-04 DIAGNOSIS — R195 Other fecal abnormalities: Secondary | ICD-10-CM | POA: Diagnosis not present

## 2021-06-04 DIAGNOSIS — Z20822 Contact with and (suspected) exposure to covid-19: Secondary | ICD-10-CM | POA: Diagnosis present

## 2021-06-04 DIAGNOSIS — M545 Low back pain, unspecified: Secondary | ICD-10-CM | POA: Diagnosis present

## 2021-06-04 DIAGNOSIS — R0602 Shortness of breath: Secondary | ICD-10-CM | POA: Diagnosis not present

## 2021-06-04 DIAGNOSIS — J441 Chronic obstructive pulmonary disease with (acute) exacerbation: Secondary | ICD-10-CM | POA: Diagnosis not present

## 2021-06-04 DIAGNOSIS — M19041 Primary osteoarthritis, right hand: Secondary | ICD-10-CM | POA: Diagnosis present

## 2021-06-04 DIAGNOSIS — I110 Hypertensive heart disease with heart failure: Secondary | ICD-10-CM | POA: Diagnosis present

## 2021-06-04 DIAGNOSIS — D649 Anemia, unspecified: Secondary | ICD-10-CM | POA: Diagnosis not present

## 2021-06-04 DIAGNOSIS — J440 Chronic obstructive pulmonary disease with acute lower respiratory infection: Secondary | ICD-10-CM | POA: Diagnosis present

## 2021-06-04 DIAGNOSIS — K209 Esophagitis, unspecified without bleeding: Secondary | ICD-10-CM | POA: Diagnosis not present

## 2021-06-04 DIAGNOSIS — G8929 Other chronic pain: Secondary | ICD-10-CM | POA: Diagnosis present

## 2021-06-04 DIAGNOSIS — D122 Benign neoplasm of ascending colon: Secondary | ICD-10-CM | POA: Diagnosis not present

## 2021-06-04 DIAGNOSIS — I10 Essential (primary) hypertension: Secondary | ICD-10-CM | POA: Diagnosis not present

## 2021-06-04 DIAGNOSIS — K635 Polyp of colon: Secondary | ICD-10-CM | POA: Diagnosis not present

## 2021-06-04 DIAGNOSIS — J9601 Acute respiratory failure with hypoxia: Secondary | ICD-10-CM | POA: Diagnosis not present

## 2021-06-04 DIAGNOSIS — E871 Hypo-osmolality and hyponatremia: Secondary | ICD-10-CM | POA: Diagnosis present

## 2021-06-04 DIAGNOSIS — I5032 Chronic diastolic (congestive) heart failure: Secondary | ICD-10-CM | POA: Diagnosis present

## 2021-06-04 DIAGNOSIS — K2100 Gastro-esophageal reflux disease with esophagitis, without bleeding: Secondary | ICD-10-CM | POA: Diagnosis not present

## 2021-06-04 DIAGNOSIS — K449 Diaphragmatic hernia without obstruction or gangrene: Secondary | ICD-10-CM | POA: Diagnosis not present

## 2021-06-04 DIAGNOSIS — M19042 Primary osteoarthritis, left hand: Secondary | ICD-10-CM | POA: Diagnosis present

## 2021-06-04 DIAGNOSIS — I517 Cardiomegaly: Secondary | ICD-10-CM | POA: Diagnosis not present

## 2021-06-04 DIAGNOSIS — N179 Acute kidney failure, unspecified: Secondary | ICD-10-CM | POA: Diagnosis present

## 2021-06-04 DIAGNOSIS — R6521 Severe sepsis with septic shock: Secondary | ICD-10-CM

## 2021-06-04 DIAGNOSIS — F3162 Bipolar disorder, current episode mixed, moderate: Secondary | ICD-10-CM | POA: Diagnosis present

## 2021-06-04 DIAGNOSIS — F419 Anxiety disorder, unspecified: Secondary | ICD-10-CM | POA: Diagnosis present

## 2021-06-04 DIAGNOSIS — E876 Hypokalemia: Secondary | ICD-10-CM | POA: Diagnosis present

## 2021-06-04 LAB — CBC
HCT: 30.5 % — ABNORMAL LOW (ref 36.0–46.0)
Hemoglobin: 9.8 g/dL — ABNORMAL LOW (ref 12.0–15.0)
MCH: 29.4 pg (ref 26.0–34.0)
MCHC: 32.1 g/dL (ref 30.0–36.0)
MCV: 91.6 fL (ref 80.0–100.0)
Platelets: 788 10*3/uL — ABNORMAL HIGH (ref 150–400)
RBC: 3.33 MIL/uL — ABNORMAL LOW (ref 3.87–5.11)
RDW: 14.7 % (ref 11.5–15.5)
WBC: 29.3 10*3/uL — ABNORMAL HIGH (ref 4.0–10.5)
nRBC: 0 % (ref 0.0–0.2)

## 2021-06-04 LAB — PROTIME-INR
INR: 1.3 — ABNORMAL HIGH (ref 0.8–1.2)
Prothrombin Time: 16.1 seconds — ABNORMAL HIGH (ref 11.4–15.2)

## 2021-06-04 LAB — MAGNESIUM: Magnesium: 1.7 mg/dL (ref 1.7–2.4)

## 2021-06-04 LAB — BASIC METABOLIC PANEL
Anion gap: 10 (ref 5–15)
BUN: 31 mg/dL — ABNORMAL HIGH (ref 8–23)
CO2: 19 mmol/L — ABNORMAL LOW (ref 22–32)
Calcium: 8.4 mg/dL — ABNORMAL LOW (ref 8.9–10.3)
Chloride: 97 mmol/L — ABNORMAL LOW (ref 98–111)
Creatinine, Ser: 1.18 mg/dL — ABNORMAL HIGH (ref 0.44–1.00)
GFR, Estimated: 52 mL/min — ABNORMAL LOW (ref >60)
Glucose, Bld: 124 mg/dL — ABNORMAL HIGH (ref 70–99)
Potassium: 3.6 mmol/L (ref 3.5–5.1)
Sodium: 126 mmol/L — ABNORMAL LOW (ref 135–145)

## 2021-06-04 LAB — RESPIRATORY PANEL BY PCR

## 2021-06-04 LAB — HIV ANTIBODY (ROUTINE TESTING W REFLEX): HIV Screen 4th Generation wRfx: NONREACTIVE

## 2021-06-04 LAB — LACTIC ACID, PLASMA
Lactic Acid, Venous: 3.6 mmol/L (ref 0.5–1.9)
Lactic Acid, Venous: 1.4 mmol/L (ref 0.5–1.9)
Lactic Acid, Venous: 1.3 mmol/L (ref 0.5–1.9)

## 2021-06-04 LAB — HEPATIC FUNCTION PANEL
ALT: 32 U/L (ref 0–44)
AST: 30 U/L (ref 15–41)
Albumin: 1.9 g/dL — ABNORMAL LOW (ref 3.5–5.0)
Alkaline Phosphatase: 77 U/L (ref 38–126)
Bilirubin, Direct: 0.2 mg/dL (ref 0.0–0.2)
Indirect Bilirubin: 0.7 mg/dL (ref 0.3–0.9)
Total Bilirubin: 0.9 mg/dL (ref 0.3–1.2)
Total Protein: 6 g/dL — ABNORMAL LOW (ref 6.5–8.1)

## 2021-06-04 LAB — RESP PANEL BY RT-PCR (FLU A&B, COVID) ARPGX2
Influenza A by PCR: NEGATIVE
Influenza B by PCR: NEGATIVE
SARS Coronavirus 2 by RT PCR: NEGATIVE

## 2021-06-04 LAB — TROPONIN I (HIGH SENSITIVITY): Troponin I (High Sensitivity): 4 ng/L (ref <18)

## 2021-06-04 LAB — MRSA NEXT GEN BY PCR, NASAL: MRSA by PCR Next Gen: NOT DETECTED

## 2021-06-04 LAB — GLUCOSE, CAPILLARY: Glucose-Capillary: 134 mg/dL — ABNORMAL HIGH (ref 70–99)

## 2021-06-04 LAB — STREP PNEUMONIAE URINARY ANTIGEN: Strep Pneumo Urinary Antigen: NEGATIVE

## 2021-06-04 MED ORDER — POLYETHYLENE GLYCOL 3350 17 G PO PACK: 17.0000 g | PACK | Freq: Every day | ORAL | Status: DC | PRN

## 2021-06-04 MED ORDER — CHLORHEXIDINE GLUCONATE CLOTH 2 % EX PADS
6.0000 | MEDICATED_PAD | Freq: Every day | CUTANEOUS | Status: DC
  Administered 2021-06-04: 6 via TOPICAL

## 2021-06-04 MED ORDER — SODIUM CHLORIDE 0.9 % IV SOLN
500.0000 mg | INTRAVENOUS | Status: DC
  Administered 2021-06-04 – 2021-06-05 (×2): 500 mg via INTRAVENOUS
  Filled 2021-06-04 (×2): qty 5

## 2021-06-04 MED ORDER — SODIUM CHLORIDE 0.9 % IV SOLN
2.0000 g | INTRAVENOUS | Status: AC
  Administered 2021-06-04 – 2021-06-08 (×5): 2 g via INTRAVENOUS
  Filled 2021-06-04 (×5): qty 20

## 2021-06-04 MED ORDER — ACETAMINOPHEN 500 MG PO TABS
1000.0000 mg | ORAL_TABLET | Freq: Four times a day (QID) | ORAL | Status: DC | PRN
  Administered 2021-06-05: 1000 mg via ORAL
  Filled 2021-06-04: qty 2

## 2021-06-04 MED ORDER — MAGNESIUM SULFATE 2 GM/50ML IV SOLN
2.0000 g | Freq: Once | INTRAVENOUS | Status: AC
  Administered 2021-06-04: 2 g via INTRAVENOUS
  Filled 2021-06-04: qty 50

## 2021-06-04 MED ORDER — HEPARIN SODIUM (PORCINE) 5000 UNIT/ML IJ SOLN
5000.0000 [IU] | Freq: Three times a day (TID) | INTRAMUSCULAR | Status: DC
  Administered 2021-06-05 – 2021-06-09 (×12): 5000 [IU] via SUBCUTANEOUS
  Filled 2021-06-04 (×12): qty 1

## 2021-06-04 MED ORDER — QUETIAPINE FUMARATE 50 MG PO TABS
100.0000 mg | ORAL_TABLET | Freq: Every day | ORAL | Status: DC
  Administered 2021-06-04 – 2021-06-08 (×5): 100 mg via ORAL
  Filled 2021-06-04 (×4): qty 2
  Filled 2021-06-04: qty 1

## 2021-06-04 MED ORDER — HYDROCORTISONE SOD SUC (PF) 250 MG IJ SOLR
200.0000 mg | Freq: Every day | INTRAMUSCULAR | Status: DC
  Filled 2021-06-04: qty 200

## 2021-06-04 MED ORDER — ALPRAZOLAM 0.25 MG PO TABS
1.0000 mg | ORAL_TABLET | Freq: Every evening | ORAL | Status: DC | PRN
  Administered 2021-06-04 – 2021-06-09 (×7): 1 mg via ORAL
  Filled 2021-06-04 (×3): qty 4
  Filled 2021-06-04: qty 2
  Filled 2021-06-04 (×3): qty 4

## 2021-06-04 MED ORDER — UMECLIDINIUM BROMIDE 62.5 MCG/ACT IN AEPB
1.0000 | INHALATION_SPRAY | Freq: Every day | RESPIRATORY_TRACT | Status: DC
  Administered 2021-06-04 – 2021-06-09 (×6): 1 via RESPIRATORY_TRACT
  Filled 2021-06-04: qty 7

## 2021-06-04 MED ORDER — MELATONIN 3 MG PO TABS
3.0000 mg | ORAL_TABLET | Freq: Every day | ORAL | Status: DC
  Filled 2021-06-04 (×5): qty 1

## 2021-06-04 MED ORDER — QUETIAPINE FUMARATE 100 MG PO TABS
100.0000 mg | ORAL_TABLET | Freq: Every day | ORAL | Status: DC
Start: 2021-06-04 — End: 2021-06-04

## 2021-06-04 MED ORDER — DOCUSATE SODIUM 100 MG PO CAPS: 100.0000 mg | ORAL_CAPSULE | Freq: Two times a day (BID) | ORAL | Status: DC | PRN

## 2021-06-04 MED ORDER — FLUTICASONE FUROATE-VILANTEROL 100-25 MCG/ACT IN AEPB
1.0000 | INHALATION_SPRAY | Freq: Every day | RESPIRATORY_TRACT | Status: DC
  Administered 2021-06-04 – 2021-06-09 (×6): 1 via RESPIRATORY_TRACT
  Filled 2021-06-04: qty 28

## 2021-06-04 MED ORDER — ALPRAZOLAM 0.25 MG PO TABS: 1.0000 mg | ORAL_TABLET | Freq: Every day | ORAL | Status: DC

## 2021-06-04 MED ORDER — LACTATED RINGERS IV BOLUS
1000.0000 mL | Freq: Once | INTRAVENOUS | Status: AC
  Administered 2021-06-04: 1000 mL via INTRAVENOUS

## 2021-06-04 MED ORDER — PANTOPRAZOLE SODIUM 40 MG PO TBEC
40.0000 mg | DELAYED_RELEASE_TABLET | Freq: Every day | ORAL | Status: DC
  Administered 2021-06-04 – 2021-06-09 (×6): 40 mg via ORAL
  Filled 2021-06-04 (×6): qty 1

## 2021-06-04 MED ORDER — FLUOXETINE HCL 20 MG PO CAPS
40.0000 mg | ORAL_CAPSULE | Freq: Every day | ORAL | Status: DC
  Administered 2021-06-04 – 2021-06-09 (×6): 40 mg via ORAL
  Filled 2021-06-04 (×6): qty 2

## 2021-06-04 MED ORDER — NOREPINEPHRINE 4 MG/250ML-% IV SOLN
2.0000 ug/min | INTRAVENOUS | Status: DC
  Administered 2021-06-04: 4 ug/min via INTRAVENOUS
  Administered 2021-06-04: 2 ug/min via INTRAVENOUS
  Filled 2021-06-04 (×2): qty 250

## 2021-06-04 MED ORDER — MELATONIN 3 MG PO TABS: 3.0000 mg | ORAL_TABLET | Freq: Every day | ORAL | Status: DC

## 2021-06-04 NOTE — Progress Notes (Signed)
Patients L forearm PIV that had been infusing magnesium IVPB found to be painful,swollen and pink. PIV removed and Dr. Humphrey Rolls, Don[facility] Gar[facility] notified. Arm elevated and cold packs placed. IV team paged. ?

## 2021-06-04 NOTE — Progress Notes (Signed)
Pharmacy Electrolyte Replacement ? ?Recent Labs: ? ?Recent Labs  ?  06/04/21 ?0813  ?K 3.6  ?MG 1.7  ?CREATININE 1.18*  ? ? ?Low Critical Values (K </= 2.5, Phos </= 1, Mg </= 1)  ? ?Plan: ?Magnesium sulfate 2g IV x1 ?KCl 10 mEq x1 already given 4/14 '@0133'$  ? ?Joseph Art, Pharm.D. ?PGY-1 Pharmacy Resident ?[phone] ?06/04/2021 10:32 AM ? ?

## 2021-06-04 NOTE — ED Notes (Signed)
Admitting paged regarding pt's request for medicine to "help with rest." Will monitor and administer per order ?

## 2021-06-04 NOTE — TOC Progression Note (Signed)
Transition of Care (TOC) - Progression Note  ? ? ?Patient Details  ?Name: Madeline Dickerson ?MRN: [id number] ?Date of Birth: Jan 07, 1961 ? ?Transition of Care (TOC) CM/SW Contact  ?Angelita Ingles, RN ?Phone [phone] ? ?06/04/2021, 3:52 PM ? ?Clinical Narrative:    ? ?Transition of Care (TOC) Screening Note ? ? ?Patient Details  ?Name: Madeline Dickerson ?Date of Birth: 04-Sep-1960 ? ? ?Transition of Care (TOC) CM/SW Contact:    ?Angelita Ingles, RN ?Phone Number: ?06/04/2021, 3:52 PM ? ? ? ?Transition of Care Department [facility]) has reviewed patient and no TOC needs have been identified at this time. We will continue to monitor patient advancement through interdisciplinary progression rounds.  ? ? ? ? ?  ?  ? ?Expected Discharge Plan and Services ?  ?  ?  ?  ?  ?                ?  ?  ?  ?  ?  ?  ?  ?  ?  ?  ? ? ?Social Determinants of Health (SDOH) Interventions ?  ? ?Readmission Risk Interventions ?   ? View : No data to display.  ?  ?  ?  ? ? ?

## 2021-06-04 NOTE — Progress Notes (Signed)
Patient has 2 working PIV's. Advised RN at bedside to re consult for PIV when needed. ?

## 2021-06-04 NOTE — Progress Notes (Signed)
An USGPIV (ultrasound guided PIV) has been placed for short-term vasopressor infusion. A correctly placed ivWatch must be used when administering Vasopressors. Should this treatment be needed beyond 72 hours, central line access should be obtained.  It will be the responsibility of the bedside nurse to follow best practice to prevent extravasations.   ?

## 2021-06-04 NOTE — ED Notes (Signed)
Per CCM, hold off on Levo & reassess pressure after 2nd bolus ?

## 2021-06-04 NOTE — H&P (Signed)
? ?NAME:  Madeline Dickerson, MRN:  [id number], DOB:  1960/12/08, LOS: 0 ?ADMISSION DATE:  06/03/2021 CONSULTATION DATE: 06/03/2021 ?REFERRING MD:  Roslynn Amble - EDP CHIEF COMPLAINT:  Hypotension, c/f PNA, sepsis  ? ?History of Present Illness:  ?61 year old woman who presented to [facility] ED 4/13 for worsening SOB/cough. CXR with significant RML PNA. Hypotensive in ED prompting volume resuscitation, peripheral pressors and ICU admission. PMHx significant for HTN, COPD, asthma (seasonal), depression/anxiety, OA, chronic LBP (on suboxone). ? ?Patient reports that she was feeling poorly with progressively worsening SOB x 3 weeks; reports fever/chills, SOB, R-sided chest pain, cough productive of thick, yellow sputum and generalized fatigue/weakness. Also with intermittent R-sided CP. Denies HA, nausea/vomiting/diarrhea, LE swelling. Denies known sick contacts at home.  ? ?On ED arrival, patient was afebrile, HR 86 and hypotensive with SBP 80s, MAP 59. SpO2 80s-90s. Labs were notable for WBC 27.4, Na 124, K 3.3, AKI with Cr 1.42 (baseline ~0.7). LA 23, BNP 368, trop unremarkable. COVID/flu negative. CXR demonstrated patchy airspace disease with areas of consolidation (mid R lung). Empiric CAP treatment was initiated and peripheral pressors (Levophed) started. ? ?PCCM consulted for ICU admission in the setting of PNA, sepsis likely 2/2 PNA. ? ?Pertinent Medical History:  ? ?Past Medical History:  ?Diagnosis Date  ? Anxiety   ? hx  ? Arthritis   ? "lower back, hands" (05/01/2015)  ? Chronic low back pain   ? disk s/p 4 diskectomies, 5th fursion, then spinal cord stimulator ([place])  ? Depression   ? Dysrhythmia   ? irruglar heart beat "nothing wrong"   ? Fibromyalgia   ? GERD (gastroesophageal reflux disease)   ? Hypertension   ? Pneumonia 05/01/2015  ? PONV (postoperative nausea and vomiting)   ? Rotator cuff disorder   ? Seasonal asthma   ? "seasonal"  ? Shortness of breath dyspnea   ? Walking pneumonia   ? ?Significant Hospital  Events: ?Including procedures, antibiotic start and stop dates in addition to other pertinent events   ?4/13 - Presented to [facility] ED for progressive SOB. RML PNA on CXR. Empiric abx started for CAP. Hypotensive with MAPs high 50s/low 60s. Peripheral NE started. Transferred to ICU. ? ?Interim History / Subjective:  ?PCCM consulted for admission in the setting of hypotension, RML PNA, possible sepsis. ? ?Objective:  ?Blood pressure (!) 84/53, pulse 87, temperature 98.8 ?F (37.1 ?C), temperature source Oral, resp. rate (!) 31, last menstrual period 08/28/2012, SpO2 93 %. ?   ?   ?No intake or output data in the 24 hours ending 06/04/21 0301 ?There were no vitals filed for this visit. ? ?Physical Examination: ?General: Acutely ill-appearing middle aged woman in NAD. ?HEENT: [place]/AT, anicteric sclera, PERRL, moist mucous membranes. ?Neuro: Awake, oriented x 4. Responds to verbal stimuli. Following commands consistently. Moves all 4 extremities spontaneously. +Corneal, +Cough, and +Gag  ?CV: RRR, no m/g/r. ?PULM: Breathing even and unlabored on 2LNC. Lung fields with scattered coarse rhonchi,  R > L. ?GI: Soft, nontender, nondistended. Normoactive bowel sounds. ?Extremities: No LE edema noted. ?Skin: Warm/dry, No rashes. ? ?Resolved Hospital Problem List:  ? ? ?Assessment & Plan:  ?Undifferentiated shock, likely septic ?Lactic acidosis ?- Admit to ICU for close monitoring ?- Goal MAP > 65 ?- Fluid resuscitation as tolerated ?- Peripheral Levophed initiated; titrate to goal MAP ?- Trend WBC, fever curve ?- Trend LA ? ?CAP of the RML ?CXR demonstrating patchy airspace disease with areas of consolidation in the mid right ?lung, which may be  infectious, inflammatory, or neoplastic. CT is ?recommended for further evaluation. ?- Empiric antibiotics for CAP coverage (ceftriaxone, azithromycin) ?- Solu-Cortef to support CAP recovery and BP ?- F/u TA/resp culture  ?- Supplemental O2 as needed ?- Intermittent CXR ?- May consider CT  Chest to better characterize PNA ? ?COPD ?Asthma, chronic ?Active smoker ?History of asthma, "cutting back" but still continues to use tobacco. ?- Pulmonary hygiene ?- Bronchodilators ?- Intermittent CXR as above ?- Cessation counseling as appropriate ? ?Hypertension ?- Hold all home antihypertensives in the setting of hypotension ? ?AKI, likely secondary to hypotension ?- Trend BMP ?- Replete electrolytes as indicated ?- Monitor I&Os ?- F/u urine studies ?- Avoid nephrotoxic agents as able ?- Ensure adequate renal perfusion ? ?Chronic pain ?Insomnia ?Depression ?Anxiety ?Home regimen includes: Suboxone, Lyrica, Seroquel, Xanax QHS, Benadryl (for sleep)  ?- Resume home medications as clinically appropriate/as BP tolerates ? ?Best Practice: (right click and "Reselect all SmartList Selections" daily)  ? ?Diet/type: Regular consistency (see orders) ?DVT prophylaxis: prophylactic heparin  ?GI prophylaxis: PPI ?Lines: N/A ?Foley:  N/A ?Code Status:  full code ?Last date of multidisciplinary goals of care discussion [Pending] ? ?Labs:  ?CBC: ?Recent Labs  ?Lab 06/03/21 ?2122 06/03/21 ?2311  ?WBC 27.4*  --   ?HGB 10.7* 12.6  ?HCT 33.9* 37.0  ?MCV 93.9  --   ?PLT 701*  --   ? ?Basic Metabolic Panel: ?Recent Labs  ?Lab 06/03/21 ?2122 06/03/21 ?2311  ?NA 124* 125*  ?K 3.3* 3.3*  ?CL 90* 93*  ?CO2 20*  --   ?GLUCOSE 107* 103*  ?BUN 37* 36*  ?CREATININE 1.42* 1.50*  ?CALCIUM 8.9  --   ? ?GFR: ?CrCl cannot be calculated (Unknown ideal weight.). ?Recent Labs  ?Lab 06/03/21 ?2122 06/04/21 ?0109  ?WBC 27.4*  --   ?LATICACIDVEN 2.3* 3.6*  ? ?Liver Function Tests: ?Recent Labs  ?Lab 06/04/21 ?0109  ?AST 30  ?ALT 32  ?ALKPHOS 77  ?BILITOT 0.9  ?PROT 6.0*  ?ALBUMIN 1.9*  ? ?No results for input(s): LIPASE, AMYLASE in the last 168 hours. ?No results for input(s): AMMONIA in the last 168 hours. ? ?ABG: ?   ?Component Value Date/Time  ? PHART 7.341 (L) 06/02/2015 1435  ? PCO2ART 29.7 (L) 06/02/2015 1435  ? PO2ART 113 (H) 06/02/2015 1435   ? HCO3 21.9 12/25/2015 1820  ? TCO2 23 06/03/2021 2311  ? ACIDBASEDEF 2.2 (H) 12/25/2015 1820  ? O2SAT 59.5 12/25/2015 1820  ?  ?Coagulation Profile: ?Recent Labs  ?Lab 06/04/21 ?0109  ?INR 1.3*  ? ?Cardiac Enzymes: ?No results for input(s): CKTOTAL, CKMB, CKMBINDEX, TROPONINI in the last 168 hours. ? ?HbA1C: ?Hgb A1c MFr Bld  ?Date/Time Value Ref Range Status  ?12/26/2015 01:47 AM 6.0 (H) 4.8 - 5.6 % Final  ?  Comment:  ?  (NOTE) ?        Pre-diabetes: 5.7 - 6.4 ?        Diabetes: >6.4 ?        Glycemic control for adults with diabetes: <7.0 ?  ?04/01/2011 08:33 AM 6.0 4.6 - 6.5 % Final  ?  Comment:  ?  Glycemic Control Guidelines for People with Diabetes:Non Diabetic:  <6%Goal of Therapy: <7%Additional Action Suggested:  >8%   ? ?CBG: ?No results for input(s): GLUCAP in the last 168 hours. ? ?Review of Systems:   ?Review of systems completed with pertinent positives/negatives outlined in above HPI. ? ?Past Medical History:  ?She,  has a past medical history of Anxiety, Arthritis, Chronic  low back pain, Depression, Dysrhythmia, Fibromyalgia, GERD (gastroesophageal reflux disease), Hypertension, Pneumonia (05/01/2015), PONV (postoperative nausea and vomiting), Rotator cuff disorder, Seasonal asthma, Shortness of breath dyspnea, and Walking pneumonia.  ? ?Surgical History:  ? ?Past Surgical History:  ?Procedure Laterality Date  ? BACK SURGERY    ? [place]  ? LUMBAR MICRODISCECTOMY  01/1990; 2003  ? LUMBAR SPINE SURGERY  03/1991  ? "cleaned up scar tissue"  ? POSTERIOR LUMBAR FUSION  2011; 04/27/2011  ? REPAIR DURAL / CSF LEAK  02/1990  ? SHOULDER ARTHROSCOPY Right 12/10/2019  ? Procedure: RIGHT SHOULDER ARTHROSCOPY AND DEBRIDEMENT;  Surgeon: Newt Minion, MD;  Location: [place];  Service: Orthopedics;  Laterality: Right;  ? SPINAL CORD STIMULATOR IMPLANT  2012  ? SPINAL CORD STIMULATOR REMOVAL  08/2010  ? TUBAL LIGATION  1986  ? ?Social History:  ? reports that she has been smoking  cigarettes. She has a 42.00 pack-year smoking history. She has never used smokeless tobacco. She reports current drug use. Drug: Marijuana. She reports that she does not drink alcohol.  ? ?Family History:  ?Her fami

## 2021-06-05 ENCOUNTER — Inpatient Hospital Stay (HOSPITAL_COMMUNITY): Payer: Medicare Other

## 2021-06-05 DIAGNOSIS — A419 Sepsis, unspecified organism: Secondary | ICD-10-CM | POA: Diagnosis not present

## 2021-06-05 DIAGNOSIS — J9601 Acute respiratory failure with hypoxia: Secondary | ICD-10-CM | POA: Diagnosis not present

## 2021-06-05 DIAGNOSIS — J189 Pneumonia, unspecified organism: Secondary | ICD-10-CM | POA: Diagnosis not present

## 2021-06-05 DIAGNOSIS — I10 Essential (primary) hypertension: Secondary | ICD-10-CM

## 2021-06-05 DIAGNOSIS — F3162 Bipolar disorder, current episode mixed, moderate: Secondary | ICD-10-CM

## 2021-06-05 LAB — BASIC METABOLIC PANEL
Anion gap: 9 (ref 5–15)
BUN: 13 mg/dL (ref 8–23)
CO2: 20 mmol/L — ABNORMAL LOW (ref 22–32)
Calcium: 8.3 mg/dL — ABNORMAL LOW (ref 8.9–10.3)
Chloride: 104 mmol/L (ref 98–111)
Creatinine, Ser: 0.82 mg/dL (ref 0.44–1.00)
GFR, Estimated: >60 mL/min (ref >60)
Glucose, Bld: 114 mg/dL — ABNORMAL HIGH (ref 70–99)
Potassium: 4.2 mmol/L (ref 3.5–5.1)
Sodium: 133 mmol/L — ABNORMAL LOW (ref 135–145)

## 2021-06-05 LAB — MAGNESIUM: Magnesium: 1.7 mg/dL (ref 1.7–2.4)

## 2021-06-05 MED ORDER — NICOTINE 21 MG/24HR TD PT24
21.0000 mg | MEDICATED_PATCH | Freq: Every day | TRANSDERMAL | Status: DC
  Filled 2021-06-05 (×3): qty 1

## 2021-06-05 MED ORDER — DM-GUAIFENESIN ER 30-600 MG PO TB12
1.0000 | ORAL_TABLET | Freq: Two times a day (BID) | ORAL | Status: DC
  Administered 2021-06-05 – 2021-06-09 (×8): 1 via ORAL
  Filled 2021-06-05 (×8): qty 1

## 2021-06-05 MED ORDER — ALBUTEROL SULFATE (2.5 MG/3ML) 0.083% IN NEBU
2.5000 mg | INHALATION_SOLUTION | RESPIRATORY_TRACT | Status: DC | PRN
  Administered 2021-06-05 – 2021-06-06 (×3): 2.5 mg via RESPIRATORY_TRACT
  Filled 2021-06-05 (×2): qty 3

## 2021-06-05 MED ORDER — SENNOSIDES-DOCUSATE SODIUM 8.6-50 MG PO TABS
2.0000 | ORAL_TABLET | Freq: Every day | ORAL | Status: DC
  Administered 2021-06-05 – 2021-06-08 (×4): 2 via ORAL
  Filled 2021-06-05 (×4): qty 2

## 2021-06-05 NOTE — Plan of Care (Signed)
  Problem: Nutrition: Goal: Adequate nutrition will be maintained Outcome: Progressing   Problem: Pain Managment: Goal: General experience of comfort will improve Outcome: Progressing   Problem: Safety: Goal: Ability to remain free from injury will improve Outcome: Progressing   

## 2021-06-05 NOTE — Progress Notes (Signed)
?PROGRESS NOTE ? ? ? ? ?Madeline Dickerson, is a 61 y.o. female, DOB - March 08, 1960, OMA:[id number] ? ?Admit date - 06/03/2021   Admitting Physician Etheleen Nicks, MD ? ?Outpatient Primary MD for the patient is Helen Hashimoto., MD ? ?LOS - 1 ? ?Chief Complaint  ?Patient presents with  ? Shortness of Breath  ?    ? ? ?Brief Narrative:  ?Briefly, 61 year old female active smoker with COPD/asthma, HTN, HFpEF who presented with cough/SOB and found with hypotension with SBP in 80s in the ED. Admitted on 06/04/21 for septic shock secondary to pneumonia. ? ?  ?-Assessment and Plan: ?Problem  ?Severe Sepsis with Septic Shock due to Rt Sided PNA   ?Cap (Community Acquired Pneumonia)  ?Acute Respiratory Failure With Hypoxia (Hcc)  ?Anemia  ?Bipolar 1 Disorder, Mixed, Moderate (Hcc)  ?Hypertension  ? ? ?1) severe sepsis with septic shock due to right-sided pneumonia--POA ?-blood pressures have improved ?-Patient has been weaned off IV Levophed ?-WBC was 29.3 K ?-Lactic acid improved from 3.6-1.3 ?-Strep pneumo negative, Legionella pending ?-Chest x-ray consistent with right-sided pneumonia ?-Blood cultures NGTD ?-Flu and COVID-negative ?-Viral respiratory panel negative ?-Continue Rocephin and azithromycin pending further culture data ? ?2) acute hypoxic respiratory failure--- currently requiring 3 L of oxygen via nasal cannula ?-Should be able to wean the O2 hopefully on 06/06/2021 ?=-PTA patient was not on O2 ? ?3)/tobacco abuse--- continue bronchodilators ?-Nicotine patch as ordered ?-Smoking cessation advised ? ?4) bipolar disorder/depression/fibromyalgia--, continue Seroquel 100 mg nightly, Prozac  40 mg daily and Xanax as needed ?-Continue to hold Lyrica for now ? ?5)Hyponatremia--- suspect dehydration related cannot rule out SIADH component given underlying pneumonia ?-Improving with hydration recheck in a.m. ? ?6),AKI----acute kidney injury--- creatinine is down to 0.82 from 1.50 on 06/03/2021 ?-AKI has resolved with  hydration ?, renally adjust medications, avoid nephrotoxic agents / dehydration  / hypotension ? ?Disposition/Need for in-Hospital Stay- patient unable to be discharged at this time due to --- ?-Possible discharge home in 1 to 2 days if continues to improve, patient may need home O2 if unable to wean off O2 completely in 1 to 2 days* ? ?Status is: Inpatient  ? ?Disposition: The patient is from: Home ?             Anticipated d/c is to: Home ?             Anticipated d/c date is: 1 day ?             Patient currently is not medically stable to d/c. ?Barriers: Not Clinically Stable-  ? ?Code Status :  -  Code Status: Full Code  ? ?Family Communication:    NA (patient is alert, awake and coherent)  ? ?DVT Prophylaxis  :   - SCDs   heparin injection 5,000 Units Start: 06/05/21 1200 ?SCDs Start: 06/04/21 0138 ? ? ?Lab Results  ?Component Value Date  ? PLT 788 (H) 06/04/2021  ? ? ?Inpatient Medications ? ?Scheduled Meds: ? Chlorhexidine Gluconate Cloth  6 each Topical Daily  ? FLUoxetine  40 mg Oral Daily  ? fluticasone furoate-vilanterol  1 puff Inhalation Daily  ? heparin  5,000 Units Subcutaneous Q8H  ? melatonin  3 mg Oral QHS  ? pantoprazole  40 mg Oral Daily  ? QUEtiapine  100 mg Oral QHS  ? umeclidinium bromide  1 puff Inhalation Daily  ? ?Continuous Infusions: ? azithromycin Stopped (06/04/21 2359)  ? cefTRIAXone (ROCEPHIN)  IV Stopped (06/04/21 2224)  ?  norepinephrine (LEVOPHED) Adult infusion Stopped (06/04/21 1440)  ? ?PRN Meds:.acetaminophen, albuterol, ALPRAZolam, docusate sodium, polyethylene glycol ? ? ?Anti-infectives (From admission, onward)  ? ? Start     Dose/Rate Route Frequency Ordered Stop  ? 06/04/21 2200  cefTRIAXone (ROCEPHIN) 2 g in sodium chloride 0.9 % 100 mL IVPB       ? 2 g ?200 mL/hr over 30 Minutes Intravenous Every 24 hours 06/04/21 0146    ? 06/04/21 2200  azithromycin (ZITHROMAX) 500 mg in sodium chloride 0.9 % 250 mL IVPB       ? 500 mg ?250 mL/hr over 60 Minutes Intravenous Every 24  hours 06/04/21 0146    ? 06/03/21 2315  azithromycin (ZITHROMAX) 500 mg in sodium chloride 0.9 % 250 mL IVPB       ? 500 mg ?250 mL/hr over 60 Minutes Intravenous  Once 06/03/21 2307 06/04/21 0133  ? 06/03/21 2230  cefTRIAXone (ROCEPHIN) 2 g in sodium chloride 0.9 % 100 mL IVPB       ? 2 g ?200 mL/hr over 30 Minutes Intravenous  Once 06/03/21 2224 06/04/21 0012  ? ?  ? ?  ? ?Subjective: ?Madeline Dickerson today has no fevers, no emesis,  No chest pain, speaking in complete sentences at rest ?-Mild dyspnea on exertion, ?-Cough improving ? ? ? ?Objective: ?Vitals:  ? 06/05/21 0503 06/05/21 0843 06/05/21 1228 06/05/21 1828  ?BP: (!) 120/53 (!) 111/55 110/63 115/68  ?Pulse: 82 90 83 87  ?Resp: '19 18 18 18  '$ ?Temp: 98.5 ?F (36.9 ?C) 99 ?F (37.2 ?C) 99 ?F (37.2 ?C) 99.7 ?F (37.6 ?C)  ?TempSrc: Oral Oral Oral Oral  ?SpO2: 95% 93% 98% 100%  ?Weight:      ?Height:      ? ? ?Intake/Output Summary (Last 24 hours) at 06/05/2021 1924 ?Last data filed at 06/05/2021 1330 ?Gross per 24 hour  ?Intake 830 ml  ?Output --  ?Net 830 ml  ? ?Filed Weights  ? 06/04/21 0616 06/04/21 0750  ?Weight: 85 kg 80.3 kg  ? ? ?Physical Exam ? ?Gen:- Awake Alert, speaking in complete sentences at rest, mild dyspnea on exertion ?HEENT:- [place].AT, No sclera icterus ?Nose- [place] 3L/min ?Neck-Supple Neck,No JVD,.  ?Lungs-somewhat diminished breath sounds on the right, no wheezing CV- S1, S2 normal, regular  ?Abd-  +ve B.Sounds, Abd Soft, No tenderness,    ?Extremity/Skin:- No  edema, pedal pulses present ?Psych-affect is appropriate, oriented x3 ?Neuro-no new focal deficits, no tremors ? ?Data Reviewed: I have personally reviewed following labs and imaging studies ? ?CBC: ?Recent Labs  ?Lab 06/03/21 ?2122 06/03/21 ?2311 06/04/21 ?7902  ?WBC 27.4*  --  29.3*  ?HGB 10.7* 12.6 9.8*  ?HCT 33.9* 37.0 30.5*  ?MCV 93.9  --  91.6  ?PLT 701*  --  788*  ? ?Basic Metabolic Panel: ?Recent Labs  ?Lab 06/03/21 ?2122 06/03/21 ?2311 06/04/21 ?4097 06/05/21 ?0256  ?NA 124* 125* 126*  133*  ?K 3.3* 3.3* 3.6 4.2  ?CL 90* 93* 97* 104  ?CO2 20*  --  19* 20*  ?GLUCOSE 107* 103* 124* 114*  ?BUN 37* 36* 31* 13  ?CREATININE 1.42* 1.50* 1.18* 0.82  ?CALCIUM 8.9  --  8.4* 8.3*  ?MG  --   --  1.7 1.7  ? ?GFR: ?Estimated Creatinine Clearance: 76 mL/min (by C-G formula based on SCr of 0.82 mg/dL). ?Liver Function Tests: ?Recent Labs  ?Lab 06/04/21 ?0109  ?AST 30  ?ALT 32  ?ALKPHOS 77  ?BILITOT 0.9  ?PROT 6.0*  ?  ALBUMIN 1.9*  ? ?Cardiac Enzymes: ?No results for input(s): CKTOTAL, CKMB, CKMBINDEX, TROPONINI in the last 168 hours. ?BNP (last 3 results) ?No results for input(s): PROBNP in the last 8760 hours. ?HbA1C: ?No results for input(s): HGBA1C in the last 72 hours. ?Sepsis Labs: ?'@LABRCNTIP'$ (procalcitonin:4,lacticidven:4) ?) ?Recent Results (from the past 240 hour(s))  ?Resp Panel by RT-PCR (Flu A&B, Covid) Nasopharyngeal Swab     Status: None  ? Collection Time: 06/04/21  1:00 AM  ? Specimen: Nasopharyngeal Swab; Nasopharyngeal(NP) swabs in vial transport medium  ?Result Value Ref Range Status  ? SARS Coronavirus 2 by RT PCR NEGATIVE NEGATIVE Final  ?  Comment: (NOTE) ?SARS-CoV-2 target nucleic acids are NOT DETECTED. ? ?The SARS-CoV-2 RNA is generally detectable in upper respiratory ?specimens during the acute phase of infection. The lowest ?concentration of SARS-CoV-2 viral copies this assay can detect is ?138 copies/mL. A negative result does not preclude SARS-Cov-2 ?infection and should not be used as the sole basis for treatment or ?other patient management decisions. A negative result may occur with  ?improper specimen collection/handling, submission of specimen other ?than nasopharyngeal swab, presence of viral mutation(s) within the ?areas targeted by this assay, and inadequate number of viral ?copies(<138 copies/mL). A negative result must be combined with ?clinical observations, patient history, and epidemiological ?information. The expected result is Negative. ? ?Fact Sheet for Patients:   ?EntrepreneurPulse.com.au ? ?Fact Sheet for Healthcare Providers:  ?IncredibleEmployment.be ? ?This test is no t yet approved or cleared by the [place] and  ?has been Child psychotherapist

## 2021-06-05 NOTE — Plan of Care (Signed)
?  Problem: Clinical Measurements: ?Goal: Respiratory complications will improve ?Outcome: Progressing ?  ?Problem: Nutrition: ?Goal: Adequate nutrition will be maintained ?06/05/2021 0901 by Derrill Kay, RN ?Outcome: Progressing ?06/05/2021 0900 by Derrill Kay, RN ?Outcome: Progressing ?  ?Problem: Pain Managment: ?Goal: General experience of comfort will improve ?06/05/2021 0901 by Derrill Kay, RN ?Outcome: Progressing ?06/05/2021 0900 by Derrill Kay, RN ?Outcome: Progressing ?  ?Problem: Safety: ?Goal: Ability to remain free from injury will improve ?06/05/2021 0901 by Derrill Kay, RN ?Outcome: Progressing ?06/05/2021 0900 by Derrill Kay, RN ?Outcome: Progressing ?  ?

## 2021-06-06 DIAGNOSIS — J9601 Acute respiratory failure with hypoxia: Secondary | ICD-10-CM | POA: Diagnosis not present

## 2021-06-06 DIAGNOSIS — J189 Pneumonia, unspecified organism: Secondary | ICD-10-CM | POA: Diagnosis not present

## 2021-06-06 LAB — VITAMIN B12: Vitamin B-12: 386 pg/mL (ref 180–914)

## 2021-06-06 LAB — IRON AND TIBC
Iron: 12 ug/dL — ABNORMAL LOW (ref 28–170)
Saturation Ratios: 7 % — ABNORMAL LOW (ref 10.4–31.8)
TIBC: 167 ug/dL — ABNORMAL LOW (ref 250–450)
UIBC: 155 ug/dL

## 2021-06-06 LAB — FERRITIN: Ferritin: 629 ng/mL — ABNORMAL HIGH (ref 11–307)

## 2021-06-06 LAB — CBC
HCT: 24.1 % — ABNORMAL LOW (ref 36.0–46.0)
Hemoglobin: 7.7 g/dL — ABNORMAL LOW (ref 12.0–15.0)
MCH: 29.6 pg (ref 26.0–34.0)
MCHC: 32 g/dL (ref 30.0–36.0)
MCV: 92.7 fL (ref 80.0–100.0)
Platelets: 601 10*3/uL — ABNORMAL HIGH (ref 150–400)
RBC: 2.6 MIL/uL — ABNORMAL LOW (ref 3.87–5.11)
RDW: 14.9 % (ref 11.5–15.5)
WBC: 19.9 10*3/uL — ABNORMAL HIGH (ref 4.0–10.5)
nRBC: 0 % (ref 0.0–0.2)

## 2021-06-06 LAB — RETICULOCYTES
Immature Retic Fract: 20.3 % — ABNORMAL HIGH (ref 2.3–15.9)
RBC.: 2.92 MIL/uL — ABNORMAL LOW (ref 3.87–5.11)
Retic Count, Absolute: 51.1 10*3/uL (ref 19.0–186.0)
Retic Ct Pct: 1.8 % (ref 0.4–3.1)

## 2021-06-06 LAB — RENAL FUNCTION PANEL
Albumin: 1.7 g/dL — ABNORMAL LOW (ref 3.5–5.0)
Anion gap: 9 (ref 5–15)
BUN: 9 mg/dL (ref 8–23)
CO2: 24 mmol/L (ref 22–32)
Calcium: 7.9 mg/dL — ABNORMAL LOW (ref 8.9–10.3)
Chloride: 98 mmol/L (ref 98–111)
Creatinine, Ser: 0.84 mg/dL (ref 0.44–1.00)
GFR, Estimated: >60 mL/min (ref >60)
Glucose, Bld: 105 mg/dL — ABNORMAL HIGH (ref 70–99)
Phosphorus: 4 mg/dL (ref 2.5–4.6)
Potassium: 3.4 mmol/L — ABNORMAL LOW (ref 3.5–5.1)
Sodium: 131 mmol/L — ABNORMAL LOW (ref 135–145)

## 2021-06-06 LAB — SODIUM, URINE, RANDOM: Sodium, Ur: 36 mmol/L

## 2021-06-06 LAB — OSMOLALITY: Osmolality: 278 mOsm/kg (ref 275–295)

## 2021-06-06 LAB — OSMOLALITY, URINE: Osmolality, Ur: 222 mOsm/kg — ABNORMAL LOW (ref 300–900)

## 2021-06-06 LAB — MAGNESIUM: Magnesium: 1.7 mg/dL (ref 1.7–2.4)

## 2021-06-06 LAB — FOLATE: Folate: 12.1 ng/mL (ref >5.9)

## 2021-06-06 MED ORDER — POTASSIUM CHLORIDE CRYS ER 20 MEQ PO TBCR
40.0000 meq | EXTENDED_RELEASE_TABLET | Freq: Once | ORAL | Status: AC
  Administered 2021-06-06: 40 meq via ORAL
  Filled 2021-06-06: qty 2

## 2021-06-06 MED ORDER — BUPRENORPHINE HCL-NALOXONE HCL 8-2 MG SL SUBL
1.0000 | SUBLINGUAL_TABLET | Freq: Two times a day (BID) | SUBLINGUAL | Status: DC
  Administered 2021-06-06 – 2021-06-09 (×7): 1 via SUBLINGUAL
  Filled 2021-06-06 (×7): qty 1

## 2021-06-06 MED ORDER — ALBUTEROL SULFATE (2.5 MG/3ML) 0.083% IN NEBU
2.5000 mg | INHALATION_SOLUTION | Freq: Four times a day (QID) | RESPIRATORY_TRACT | Status: DC
  Administered 2021-06-06: 2.5 mg via RESPIRATORY_TRACT
  Filled 2021-06-06 (×2): qty 3

## 2021-06-06 MED ORDER — ALBUTEROL SULFATE (2.5 MG/3ML) 0.083% IN NEBU
2.5000 mg | INHALATION_SOLUTION | Freq: Three times a day (TID) | RESPIRATORY_TRACT | Status: DC
  Administered 2021-06-07 – 2021-06-08 (×4): 2.5 mg via RESPIRATORY_TRACT
  Filled 2021-06-06 (×4): qty 3

## 2021-06-06 MED ORDER — PREDNISONE 20 MG PO TABS
40.0000 mg | ORAL_TABLET | Freq: Every day | ORAL | Status: DC
  Administered 2021-06-06 – 2021-06-09 (×4): 40 mg via ORAL
  Filled 2021-06-06 (×4): qty 2

## 2021-06-06 MED ORDER — AZITHROMYCIN 250 MG PO TABS
500.0000 mg | ORAL_TABLET | Freq: Every day | ORAL | Status: DC
  Administered 2021-06-06 – 2021-06-09 (×4): 500 mg via ORAL
  Filled 2021-06-06 (×4): qty 2

## 2021-06-06 NOTE — Progress Notes (Addendum)
? ? ? [facility] ?                                                                            ? ? ?Madeline Dickerson, is a 61 y.o. female, DOB - [birth year], AQT:[id number] ?Admit date - 06/03/2021    ?Outpatient Primary MD for the patient is Helen Hashimoto., MD ? ?LOS - 2  days ? ? ? ?Brief summary  ? ? ?Briefly, 61 year old female active smoker with COPD/asthma, HTN, HFpEF who presented with cough/SOB and found with hypotension with SBP in 80s in the ED. Admitted on 06/04/21 for septic shock secondary to pneumonia. ? ?Assessment & Plan  ? ? ?Assessment and Plan: ? ?Severe sepsis with septic shock ?Right middle lung pneumonia, POA ?-Chest x-ray showed patchy airspace disease with areas of consolidation in mid right lung ?-Patient met sepsis criteria at the time of admission with leukocytosis, lactic acidosis, hypotension, pneumonia, needed pressors ?--Lactic acid improved from 3.6-1.3.  Respiratory virus panel negative, COVID negative.  Strep pneumo negative, Legionella pending.  ?- Blood cultures negative.  Leukocytosis improving ?-Continue IV Rocephin, Zithromax ?-Still course of breath sounds bilaterally and hypoxia ? ?Acute respiratory failure with hypoxia, history of COPD, active smoker ?-Still on 3 L O2 via [place], not on O2 at home ?-Wean O2 as tolerated, will need home O2 evaluation prior to DC ?-Continue Incruse Ellipta, albuterol, Mucinex, flutter valve ?-Added prednisone 40 mg daily x 5 days ? ?Tobacco use ?-Smoking cessation counseled, continue nicotine patch ?  ? ?bipolar disorder/depression/fibromyalgia ?-Continue Seroquel, Prozac, Xanax as needed ? ?Hyponatremia ?-Likely due to dehydration, possible SIADH from pneumonia ?- Na trended down to 131, asymptomatic.  Will check serum osmolarity, urine osmolarity, UNA ? ?Acute kidney injury ?-Creatinine 1.5 on 4/13 ?-Resolved, with IV fluid hydration, 0.8 ? ?Anemia, normocytic ?-Hemoglobin down to 7.7 today, possibly dilutional, will recheck in  a.m. ?-Currently no complaints of any bleeding ? ?Hypokalemia ?Replaced ?  ? ?Code Status: Full CODE STATUS ?DVT Prophylaxis:  heparin injection 5,000 Units Start: 06/05/21 1200 ?SCDs Start: 06/04/21 0138 ? ? ?Level of Care: Level of care: Telemetry Medical ?Family Communication:  ?Disposition Plan:     Remains inpatient appropriate: Still hypoxia with coarse breath sounds, will likely need 24 to 48 hours of IV antibiotics ? ?Procedures:  ? ? ?Consultants:   ? ? ?Antimicrobials:  ? ?IV Zithromax  4/13--> ?IV Rocephin  4/13  -> ? ? ?Medications ? ? azithromycin  500 mg Oral Daily  ? buprenorphine-naloxone  1 tablet Sublingual BID  ? dextromethorphan-guaiFENesin  1 tablet Oral BID  ? FLUoxetine  40 mg Oral Daily  ? fluticasone furoate-vilanterol  1 puff Inhalation Daily  ? heparin  5,000 Units Subcutaneous Q8H  ? melatonin  3 mg Oral QHS  ? nicotine  21 mg Transdermal Daily  ? pantoprazole  40 mg Oral Daily  ? QUEtiapine  100 mg Oral QHS  ? senna-docusate  2 tablet Oral QHS  ? umeclidinium bromide  1 puff Inhalation Daily  ? ? ? ?Subjective:  ? ?Madeline Dickerson was seen and examined today.  Still feels debilitated, hypoxia on 3 L O2 not on O2 at home.  Patient denies dizziness,  chest pain, abdominal pain, N/V/D/C.  No fevers ? ?Objective:  ? ?Vitals:  ? 06/05/21 2024 06/06/21 0425 06/06/21 9090 06/06/21 3014  ?BP: 115/74 104/63 114/74   ?Pulse: 85 66 76   ?Resp: '18 19 18   ' ?Temp: 98.8 ?F (37.1 ?C) 97.9 ?F (36.6 ?C) 98.7 ?F (37.1 ?C)   ?TempSrc: Oral Oral Oral   ?SpO2: 97% 96% 99% 98%  ?Weight:      ?Height:      ? ? ?Intake/Output Summary (Last 24 hours) at 06/06/2021 1312 ?Last data filed at 06/06/2021 0200 ?Gross per 24 hour  ?Intake 880.66 ml  ?Output --  ?Net 880.66 ml  ? ?Filed Weights  ? 06/04/21 0616 06/04/21 0750  ?Weight: 85 kg 80.3 kg  ? ? ? ?Exam ?General: Alert and oriented x 3, NAD ?Cardiovascular: S1 S2 auscultated, RRR ?Respiratory: Coarse breath sounds bilaterally ?Gastrointestinal: Soft, nontender,  nondistended, + bowel sounds ?Ext: no pedal edema bilaterally ?Neuro: no new deficits ?Skin: No rashes ?Psych: Normal affect and demeanor, alert and oriented x3  ? ? ?Data Reviewed:  I have personally reviewed following labs  ? ? ?CBC ?Lab Results  ?Component Value Date  ? WBC 19.9 (H) 06/06/2021  ? RBC 2.60 (L) 06/06/2021  ? HGB 7.7 (L) 06/06/2021  ? HCT 24.1 (L) 06/06/2021  ? MCV 92.7 06/06/2021  ? MCH 29.6 06/06/2021  ? PLT 601 (H) 06/06/2021  ? MCHC 32.0 06/06/2021  ? RDW 14.9 06/06/2021  ? LYMPHSABS 1.4 11/29/2016  ? MONOABS 0.9 11/29/2016  ? EOSABS 0.1 11/29/2016  ? BASOSABS 0.0 11/29/2016  ? ? ? ?Last metabolic panel ?Lab Results  ?Component Value Date  ? NA 131 (L) 06/06/2021  ? K 3.4 (L) 06/06/2021  ? CL 98 06/06/2021  ? CO2 24 06/06/2021  ? BUN 9 06/06/2021  ? CREATININE 0.84 06/06/2021  ? GLUCOSE 105 (H) 06/06/2021  ? GFRNONAA >60 06/06/2021  ? GFRAA >60 10/09/2019  ? CALCIUM 7.9 (L) 06/06/2021  ? PHOS 4.0 06/06/2021  ? PROT 6.0 (L) 06/04/2021  ? ALBUMIN 1.7 (L) 06/06/2021  ? BILITOT 0.9 06/04/2021  ? ALKPHOS 77 06/04/2021  ? AST 30 06/04/2021  ? ALT 32 06/04/2021  ? ANIONGAP 9 06/06/2021  ? ? ?CBG (last 3)  ?Recent Labs  ?  06/04/21 ?0753  ?GLUCAP 134*  ?  ? ? ?Coagulation Profile: ?Recent Labs  ?Lab 06/04/21 ?0109  ?INR 1.3*  ? ? ? ?Radiology Studies: I have personally reviewed the imaging studies  ?DG Chest Port 1 View ? ?Result Date: 06/05/2021 ?CLINICAL DATA:  Right middle lobe pneumonia. EXAM: PORTABLE CHEST 1 VIEW COMPARISON:  06/03/2021. FINDINGS: The heart is enlarged and the mediastinal contour is stable. Increased airspace opacities are noted in the right lung with areas of consolidation centrally. The left lung is clear. No effusion or pneumothorax. No acute osseous abnormality. IMPRESSION: Increased airspace disease in the right lung with areas of consolidation in the perihilar region, concerning for pneumonia. Electronically Signed   By: Brett Fairy M.D.   On: 06/05/2021 00:21    ? ? ? ? ?Estill Cotta M.D. ?[facility] ?06/06/2021, 1:12 PM ? ?Available via Epic secure chat 7am-7pm ?After 7 pm, please refer to night coverage provider listed on amion. ? ?  ?

## 2021-06-06 NOTE — Progress Notes (Signed)
Patient was asleep for scheduled albuterol treatment. When patient woke up, she asked this nurse for one. PRN given. Patient remains on room air at this time. ?

## 2021-06-07 DIAGNOSIS — J9601 Acute respiratory failure with hypoxia: Secondary | ICD-10-CM | POA: Diagnosis not present

## 2021-06-07 DIAGNOSIS — R6521 Severe sepsis with septic shock: Secondary | ICD-10-CM | POA: Diagnosis not present

## 2021-06-07 DIAGNOSIS — A419 Sepsis, unspecified organism: Secondary | ICD-10-CM | POA: Diagnosis not present

## 2021-06-07 LAB — CBC
HCT: 25.4 % — ABNORMAL LOW (ref 36.0–46.0)
Hemoglobin: 7.8 g/dL — ABNORMAL LOW (ref 12.0–15.0)
MCH: 29 pg (ref 26.0–34.0)
MCHC: 30.7 g/dL (ref 30.0–36.0)
MCV: 94.4 fL (ref 80.0–100.0)
Platelets: 665 10*3/uL — ABNORMAL HIGH (ref 150–400)
RBC: 2.69 MIL/uL — ABNORMAL LOW (ref 3.87–5.11)
RDW: 14.8 % (ref 11.5–15.5)
WBC: 16.4 10*3/uL — ABNORMAL HIGH (ref 4.0–10.5)
nRBC: 0 % (ref 0.0–0.2)

## 2021-06-07 LAB — BASIC METABOLIC PANEL
Anion gap: 11 (ref 5–15)
Anion gap: 9 (ref 5–15)
BUN: 9 mg/dL (ref 8–23)
BUN: 9 mg/dL (ref 8–23)
CO2: 20 mmol/L — ABNORMAL LOW (ref 22–32)
CO2: 23 mmol/L (ref 22–32)
Calcium: 8.4 mg/dL — ABNORMAL LOW (ref 8.9–10.3)
Calcium: 8.6 mg/dL — ABNORMAL LOW (ref 8.9–10.3)
Chloride: 99 mmol/L (ref 98–111)
Chloride: 100 mmol/L (ref 98–111)
Creatinine, Ser: 0.82 mg/dL (ref 0.44–1.00)
Creatinine, Ser: 0.73 mg/dL (ref 0.44–1.00)
GFR, Estimated: >60 mL/min (ref >60)
GFR, Estimated: >60 mL/min (ref >60)
Glucose, Bld: 173 mg/dL — ABNORMAL HIGH (ref 70–99)
Glucose, Bld: 162 mg/dL — ABNORMAL HIGH (ref 70–99)
Potassium: 4.1 mmol/L (ref 3.5–5.1)
Potassium: 3.9 mmol/L (ref 3.5–5.1)
Sodium: 130 mmol/L — ABNORMAL LOW (ref 135–145)
Sodium: 132 mmol/L — ABNORMAL LOW (ref 135–145)

## 2021-06-07 LAB — LEGIONELLA PNEUMOPHILA SEROGP 1 UR AG: L. pneumophila Serogp 1 Ur Ag: NEGATIVE

## 2021-06-07 MED ORDER — FERROUS SULFATE 325 (65 FE) MG PO TABS
325.0000 mg | ORAL_TABLET | Freq: Every day | ORAL | Status: DC
  Administered 2021-06-08: 325 mg via ORAL
  Filled 2021-06-07: qty 1

## 2021-06-07 MED ORDER — SODIUM CHLORIDE 0.9 % IV SOLN
510.0000 mg | Freq: Once | INTRAVENOUS | Status: AC
  Administered 2021-06-07: 510 mg via INTRAVENOUS
  Filled 2021-06-07: qty 17

## 2021-06-07 MED ORDER — POLYETHYLENE GLYCOL 3350 17 G PO PACK
17.0000 g | PACK | Freq: Every day | ORAL | Status: DC
  Administered 2021-06-07 – 2021-06-08 (×2): 17 g via ORAL
  Filled 2021-06-07 (×2): qty 1

## 2021-06-07 NOTE — Progress Notes (Signed)
?PROGRESS NOTE ? ?Madeline Dickerson  CWU:[id number] DOB: [birth year] DOA: 06/03/2021 ?PCP: Helen Hashimoto., MD  ? ?Brief Narrative: ? ?Patient is a 61 year old female with history of COPD/asthma, hypertension, diastolic congestive heart failure who presented initially with cough, shortness of breath.  Found to be hypotensive on arrival .  Chest imaging on presentation showed areas of consolidation in the mid right lung.  Patient was admitted for the management of severe sepsis/septic shock secondary to pneumonia, COPD exacerbation.  Initially admitted under ICU service, transferred to [facility] service after hemodynamic stability.  Overall status is getting better.  Currently on room air.  Plan for discharge tomorrow to home. ? ?Assessment & Plan: ? ?Principal Problem: ?  Severe Sepsis with Septic Shock due to Rt Sided PNA  ?Active Problems: ?  CAP (community acquired pneumonia) ?  Acute respiratory failure with hypoxia ([place]) ?  Hypertension ?  Bipolar 1 disorder, mixed, moderate (HCC) ?  Anemia ? ?Severe sepsis/septic shock: Presented with hypotension, leukocytosis, lactic acidosis.  Admitted under ICU service, needed pressures on presentation.  Currently hemodynamically stable.  Respiratory viral panel, COVID-negative, streptococcal pneumonia antigen negative.  Blood cultures no growth till date.  Leukocytosis improving ? ?Right middle lobe pneumonia: Currently on ceftriaxone azithromycin.  Respiratory status getting better.  Currently on room air ? ?History of COPD: Not on home oxygen.  Has inhalers at home.  Requesting for nebulizer machine.  Currently on room air. ?On prednisone.  Will recommend to follow-up with pulmonology as an outpatient for PFT. ? ?Iron deficiency anemia: Hemoglobin in the range of 7.  Hemoglobin was 10.7 on 4/13 and 14.3 on 10/09/2019.  Denies any hematochezia or melena.  Iron studies showed severe iron deficiency.  Given a dose of IV iron today, continue oral supplementation on discharge.   Checking FOBT to rule out GI bleed ? ?Tobacco use: Down to 2 cigarettes a day.   counseled for complete stop.  On nicotine patch ? ?History of bipolar disorder/depression/fibromyalgia: On Seroquel, Prozac, Xanax as needed ? ?Hyponatremia: Improving.  Could be from SIADH from pneumonia ? ?AKI: Resolved with IV fluids ? ?Hypokalemia: Supplemented and corrected ? ?Debility/deconditioning: Lives with a friend.  Complains of severe weakness.  PT recommended home health on discharge ? ? ?  ? ? ?  ?  ? ?DVT prophylaxis:heparin injection 5,000 Units Start: 06/05/21 1200 ?SCDs Start: 06/04/21 0138 ? ? ?  Code Status: Full Code ? ?Family Communication: None at bedside ? ?Patient status: Inpatient ? ?Patient is from : Home ? ?Anticipated discharge to: Home ? ?Estimated DC date: Tomorrow ? ? ?Consultants: PCCM ? ?Procedures: None ? ?Antimicrobials:  ?Anti-infectives (From admission, onward)  ? ? Start     Dose/Rate Route Frequency Ordered Stop  ? 06/06/21 1000  azithromycin (ZITHROMAX) tablet 500 mg       ? 500 mg Oral Daily 06/06/21 0800    ? 06/04/21 2200  cefTRIAXone (ROCEPHIN) 2 g in sodium chloride 0.9 % 100 mL IVPB       ? 2 g ?200 mL/hr over 30 Minutes Intravenous Every 24 hours 06/04/21 0146    ? 06/04/21 2200  azithromycin (ZITHROMAX) 500 mg in sodium chloride 0.9 % 250 mL IVPB  Status:  Discontinued       ? 500 mg ?250 mL/hr over 60 Minutes Intravenous Every 24 hours 06/04/21 0146 06/06/21 0800  ? 06/03/21 2315  azithromycin (ZITHROMAX) 500 mg in sodium chloride 0.9 % 250 mL IVPB       ?  500 mg ?250 mL/hr over 60 Minutes Intravenous  Once 06/03/21 2307 06/04/21 0133  ? 06/03/21 2230  cefTRIAXone (ROCEPHIN) 2 g in sodium chloride 0.9 % 100 mL IVPB       ? 2 g ?200 mL/hr over 30 Minutes Intravenous  Once 06/03/21 2224 06/04/21 0012  ? ?  ? ? ?Subjective: ?Patient seen and examined at the bedside this morning.  Hemodynamically stable.  On room air.  Has some cough but denies any worsening shortness of breath.  Complains  of weakness.  She feels that she is not ready to go home today. ? ?Objective: ?Vitals:  ? 06/06/21 2207 06/07/21 0449 06/07/21 0500 06/07/21 0901  ?BP:  113/63  (!) 143/93  ?Pulse:  66  84  ?Resp:  17  19  ?Temp:  97.7 ?F (36.5 ?C)  98 ?F (36.7 ?C)  ?TempSrc:  Oral  Oral  ?SpO2: 93% 96%  99%  ?Weight:   82.9 kg   ?Height:      ? ? ?Intake/Output Summary (Last 24 hours) at 06/07/2021 1139 ?Last data filed at 06/07/2021 0000 ?Gross per 24 hour  ?Intake 880 ml  ?Output --  ?Net 880 ml  ? ?Filed Weights  ? 06/04/21 0616 06/04/21 0750 06/07/21 0500  ?Weight: 85 kg 80.3 kg 82.9 kg  ? ? ?Examination: ? ?General exam: Overall comfortable, not in distress, pleasant female ?HEENT: PERRL ?Respiratory system: Diminished air sounds on the right side, no wheezing or crackles ?Cardiovascular system: S1 & S2 heard, RRR.  ?Gastrointestinal system: Abdomen is nondistended, soft and nontender. ?Central nervous system: Alert and oriented ?Extremities: No edema, no clubbing ,no cyanosis ?Skin: No rashes, no ulcers,no icterus   ? ? ?Data Reviewed: I have personally reviewed following labs and imaging studies ? ?CBC: ?Recent Labs  ?Lab 06/03/21 ?2122 06/03/21 ?2311 06/04/21 ?7078 06/06/21 ?0130 06/07/21 ?6754  ?WBC 27.4*  --  29.3* 19.9* 16.4*  ?HGB 10.7* 12.6 9.8* 7.7* 7.8*  ?HCT 33.9* 37.0 30.5* 24.1* 25.4*  ?MCV 93.9  --  91.6 92.7 94.4  ?PLT 701*  --  788* 601* 665*  ? ?Basic Metabolic Panel: ?Recent Labs  ?Lab 06/04/21 ?4920 06/05/21 ?0256 06/06/21 ?0130 06/07/21 ?1007 06/07/21 ?1041  ?NA 126* 133* 131* 130* 132*  ?K 3.6 4.2 3.4* 4.1 3.9  ?CL 97* 104 98 99 100  ?CO2 19* 20* 24 20* 23  ?GLUCOSE 124* 114* 105* 173* 162*  ?BUN 31* '13 9 9 9  '$ ?CREATININE 1.18* 0.82 0.84 0.82 0.73  ?CALCIUM 8.4* 8.3* 7.9* 8.4* 8.6*  ?MG 1.7 1.7 1.7  --   --   ?PHOS  --   --  4.0  --   --   ? ? ? ?Recent Results (from the past 240 hour(s))  ?Resp Panel by RT-PCR (Flu A&B, Covid) Nasopharyngeal Swab     Status: None  ? Collection Time: 06/04/21  1:00 AM  ?  Specimen: Nasopharyngeal Swab; Nasopharyngeal(NP) swabs in vial transport medium  ?Result Value Ref Range Status  ? SARS Coronavirus 2 by RT PCR NEGATIVE NEGATIVE Final  ?  Comment: (NOTE) ?SARS-CoV-2 target nucleic acids are NOT DETECTED. ? ?The SARS-CoV-2 RNA is generally detectable in upper respiratory ?specimens during the acute phase of infection. The lowest ?concentration of SARS-CoV-2 viral copies this assay can detect is ?138 copies/mL. A negative result does not preclude SARS-Cov-2 ?infection and should not be used as the sole basis for treatment or ?other patient management decisions. A negative result may occur with  ?improper  specimen collection/handling, submission of specimen other ?than nasopharyngeal swab, presence of viral mutation(s) within the ?areas targeted by this assay, and inadequate number of viral ?copies(<138 copies/mL). A negative result must be combined with ?clinical observations, patient history, and epidemiological ?information. The expected result is Negative. ? ?Fact Sheet for Patients:  ?EntrepreneurPulse.com.au ? ?Fact Sheet for Healthcare Providers:  ?IncredibleEmployment.be ? ?This test is no t yet approved or cleared by the [place] FDA and  ?has been authorized for detection and/or diagnosis of SARS-CoV-2 by ?FDA under an Emergency Use Authorization (EUA). This EUA will remain  ?in effect (meaning this test can be used) for the duration of the ?COVID-19 declaration under Section 564(b)(1) of the Act, 21 ?U.S.C.section 360bbb-3(b)(1), unless the authorization is terminated  ?or revoked sooner.  ? ? ?  ? Influenza A by PCR NEGATIVE NEGATIVE Final  ? Influenza B by PCR NEGATIVE NEGATIVE Final  ?  Comment: (NOTE) ?The Xpert Xpress SARS-CoV-2/FLU/RSV plus assay is intended as an aid ?in the diagnosis of influenza from Nasopharyngeal swab specimens and ?should not be used as a sole basis for treatment. Nasal washings and ?aspirates are  unacceptable for Xpert Xpress SARS-CoV-2/FLU/RSV ?testing. ? ?Fact Sheet for Patients: ?EntrepreneurPulse.com.au ? ?Fact Sheet for Healthcare Providers: ?IncredibleEmployment.be ? ?

## 2021-06-07 NOTE — Progress Notes (Signed)
Mobility Specialist Progress Note  ? ? 06/07/21 1535  ?Mobility  ?Activity Ambulated independently in hallway  ?Level of Assistance Standby assist, set-up cues, supervision of patient - no hands on  ?Assistive Device None  ?Distance Ambulated (ft) 570 ft  ?Activity Response Tolerated well  ?$Mobility charge 1 Mobility  ? ?Pt received in chair and agreeable. No complaints on walk. Returned to chair with call bell in reach.   ? ?Hildred Alamin ?Mobility Specialist  ?Primary: 5N M.S. Phone: [phone] ?Secondary: 6N M.S. Phone: [phone] ?   ?

## 2021-06-07 NOTE — Evaluation (Signed)
Physical Therapy Evaluation ? ?Patient Details ?Name: Madeline Dickerson ?MRN: [id number] ?DOB: 11-15-1960 ?Today's Date: 06/07/2021 ? ?History of Present Illness ? Pt is a 61 y/o female who presents 4/13 with cough/SOB and found with hypotension with SBP in 80s in the ED. Admitted on 06/04/21 for septic shock secondary to pneumonia. PMH significant for several back surgeries, fibromyalgia, HTN, rotator cuff disorder. ?  ?Clinical Impression ? Pt admitted with above diagnosis. Pt currently with functional limitations due to the deficits listed below (see PT Problem List). At the time of PT eval pt was able to perform transfers with modified independence and ambulation with up to min assist for balance support. Pt on RA throughout session and O2 sats remained >91% throughout functional mobility, and was 94% after stair training. By end of gait training pt at 3/4 DOE scale. Pt left on RA at end of session and RN notified. Feel this patient will benefit from HHPT follow up to decrease risk for falls, improve tolerance for functional activity, and return to PLOF. Acutely, pt will benefit from skilled PT to increase their independence and safety with mobility to allow discharge to the venue listed below.      ?   ? ?Recommendations for follow up therapy are one component of a multi-disciplinary discharge planning process, led by the attending physician.  Recommendations may be updated based on patient status, additional functional criteria and insurance authorization. ? ?Follow Up Recommendations Home health PT ? ?  ?Assistance Recommended at Discharge PRN  ?Patient can return home with the following ? A little help with walking and/or transfers;Assistance with cooking/housework;Assist for transportation;Help with stairs or ramp for entrance ? ?  ?Equipment Recommendations None recommended by PT  ?Recommendations for Other Services ?    ?  ?Functional Status Assessment Patient has had a recent decline in their functional status  and demonstrates the ability to make significant improvements in function in a reasonable and predictable amount of time.  ? ?  ?Precautions / Restrictions Precautions ?Precautions: Fall ?Restrictions ?Weight Bearing Restrictions: No  ? ?  ? ?Mobility ? Bed Mobility ?Overal bed mobility: Modified Independent ?  ?  ?  ?  ?  ?  ?  ?  ? ?Transfers ?Overall transfer level: Modified independent ?Equipment used: None ?  ?  ?  ?  ?  ?  ?  ?  ?  ? ?Ambulation/Gait ?Ambulation/Gait assistance: Min assist ?Gait Distance (Feet): 400 Feet ?Assistive device: None ?Gait Pattern/deviations: Step-through pattern, Decreased stride length, Trunk flexed, Wide base of support ?Gait velocity: Decreased ?Gait velocity interpretation: 1.31 - 2.62 ft/sec, indicative of limited community ambulator ?  ?General Gait Details: Slow and occasionally unsteady. 1 LOB requiring min A to recover. Pt with mildly wide BOS with decreased floor clearance. ? ?Stairs ?Stairs: Yes ?Stairs assistance: Min guard ?Stair Management: Step to pattern, One rail Right, Forwards ?Number of Stairs: 6 ?General stair comments: VC's for sequencing and general safety. No assist required to negotiate stairs. ? ?Wheelchair Mobility ?  ? ?Modified Rankin (Stroke Patients Only) ?  ? ?  ? ?Balance Overall balance assessment: Needs assistance ?Sitting-balance support: No upper extremity supported, Feet supported ?Sitting balance-Leahy Scale: Good ?  ?  ?Standing balance support: No upper extremity supported, During functional activity ?Standing balance-Leahy Scale: Fair ?Standing balance comment: 1 instance of poor balance, min assist to recover ?  ?  ?  ?  ?  ?  ?  ?High Level Balance Comments: Pt demonstrated no deviations  with head turns, weaving, change in gait speed, and quick turns. 1 LOB with closing eyes during ambulation. ?  ?  ?  ?   ? ? ? ?Pertinent Vitals/Pain Pain Assessment ?Pain Assessment: No/denies pain  ? ? ?Home Living Family/patient expects to be  discharged to:: Private residence ?Living Arrangements: Non-relatives/Friends (Roommate) ?Available Help at Discharge: Family;Available 24 hours/day ?Type of Home: House ?Home Access: Level entry ?  ?  ?Alternate Level Stairs-Number of Steps: 14 (7 steps, landing, 7 steps) ?Home Layout: Two level ?Home Equipment: Grab bars - tub/shower;Rollator (4 wheels) ?   ?  ?Prior Function Prior Level of Function : Needs assist ?  ?  ?  ?  ?  ?  ?Mobility Comments: Assist to go up the stairs because she felt shaky. No AD ?ADLs Comments: Does not drive - does grocery pick up. ?  ? ? ?Hand Dominance  ? Dominant Hand: Right ? ?  ?Extremity/Trunk Assessment  ? Upper Extremity Assessment ?Upper Extremity Assessment: Overall WFL for tasks assessed ?  ? ?Lower Extremity Assessment ?Lower Extremity Assessment: Generalized weakness ?  ? ?Cervical / Trunk Assessment ?Cervical / Trunk Assessment: Kyphotic  ?Communication  ? Communication: No difficulties  ?Cognition Arousal/Alertness: Awake/alert ?Behavior During Therapy: [facility] for tasks assessed/performed ?Overall Cognitive Status: Within Functional Limits for tasks assessed ?  ?  ?  ?  ?  ?  ?  ?  ?  ?  ?  ?  ?  ?  ?  ?  ?  ?  ?  ? ?  ?General Comments   ? ?  ?Exercises    ? ?Assessment/Plan  ?  ?PT Assessment Patient needs continued PT services  ?PT Problem List   ? ?   ?  ?PT Treatment Interventions DME instruction;Gait training;Stair training;Functional mobility training;Therapeutic activities;Therapeutic exercise;Neuromuscular re-education;Patient/family education   ? ?PT Goals (Current goals can be found in the Care Plan section)  ?Acute Rehab PT Goals ?Patient Stated Goal: Back to PLOF ?PT Goal Formulation: With patient ?Time For Goal Achievement: 06/14/21 ?Potential to Achieve Goals: Good ? ?  ?Frequency Min 3X/week ?  ? ? ?Co-evaluation   ?  ?  ?  ?  ? ? ?  ?AM-PAC PT "6 Clicks" Mobility  ?Outcome Measure Help needed turning from your back to your side while in a flat bed without  using bedrails?: None ?Help needed moving from lying on your back to sitting on the side of a flat bed without using bedrails?: None ?Help needed moving to and from a bed to a chair (including a wheelchair)?: None ?Help needed standing up from a chair using your arms (e.g., wheelchair or bedside chair)?: None ?Help needed to walk in hospital room?: A Little ?Help needed climbing 3-5 steps with a railing? : A Little ?6 Click Score: 22 ? ?  ?End of Session Equipment Utilized During Treatment: Gait belt ?Activity Tolerance: Patient tolerated treatment well ?Patient left: in chair;with call bell/phone within reach;with chair alarm set ?Nurse Communication: Mobility status ?PT Visit Diagnosis: Unsteadiness on feet (R26.81);Muscle weakness (generalized) (M62.81) ?  ? ?Time: [phone] ?PT Time Calculation (min) (ACUTE ONLY): 28 min ? ? ?Charges:   PT Evaluation ?$PT Eval Low Complexity: 1 Low ?PT Treatments ?$Gait Training: 8-22 mins ?  ?   ? ? ?Rolinda Roan, PT, DPT ?Acute Rehabilitation Services ?Secure Chat Preferred ?Office: [phone]  ? ?Thelma Comp ?06/07/2021, 10:27 AM ? ?

## 2021-06-07 NOTE — Care Management Important Message (Signed)
Important Message ? ?Patient Details  ?Name: Madeline Dickerson ?MRN: [id number] ?Date of Birth: [birth year] ? ? ?Medicare Important Message Given:  Yes ? ? ? ? ?Morelia Cassells ?06/07/2021, 4:08 PM ?

## 2021-06-07 NOTE — TOC Initial Note (Addendum)
Transition of Care (TOC) - Initial/Assessment Note  ? ? ?Patient Details  ?Name: Madeline Dickerson ?MRN: [id number] ?Date of Birth: 04/10/1960 ? ?Transition of Care (TOC) CM/SW Contact:    ?Marilu Favre, RN ?Phone Number: ?06/07/2021, 12:11 PM ? ?Clinical Narrative:                 ?Spoke to patient at bedside.  ? ?Patient rents a room from a friend who takes classes from home and is able to assist her. She has a Rollator and cane already.  ? ?NCM discussed PT recommendation for HHPT. Patient does not feel at this time she needs HHPT. If she changes her mind after dsicharge she will call her PCP office. NCM secure chatted MD ? ?Expected Discharge Plan: [place] ?Barriers to Discharge: Continued Medical Work up ? ? ?Patient Goals and CMS Choice ?Patient states their goals for this hospitalization and ongoing recovery are:: to return to home ?CMS Medicare.gov Compare Post Acute Care list provided to:: Patient ?Choice offered to / list presented to : Patient ? ?Expected Discharge Plan and Services ?Expected Discharge Plan: [place] ?  ?Discharge Planning Services: CM Consult ?Post Acute Care Choice: Home Health ?Living arrangements for the past 2 months: [place] ?                ?[facility] called for NEB machine  ? ?  ?  ?  ?HH Arranged: Refused HH ?  ?  ?  ?  ? ?Prior Living Arrangements/Services ?Living arrangements for the past 2 months: [place] ?Lives with:: Other (Comment) ?Patient language and need for interpreter reviewed:: Yes ?Do you feel safe going back to the place where you live?: Yes      ?Need for Family Participation in Patient Care: Yes (Comment) ?Care giver support system in place?: Yes (comment) ?Current home services: DME ?Criminal Activity/Legal Involvement Pertinent to Current Situation/Hospitalization: No - Comment as needed ? ?Activities of Daily Living ?Home Assistive Devices/Equipment: Gilford Rile (specify type) ?ADL Screening (condition  at time of admission) ?Patient's cognitive ability adequate to safely complete daily activities?: Yes ?Is the patient deaf or have difficulty hearing?: No ?Does the patient have difficulty seeing, even when wearing glasses/contacts?: No ?Does the patient have difficulty concentrating, remembering, or making decisions?: No ?Patient able to express need for assistance with ADLs?: Yes ?Does the patient have difficulty dressing or bathing?: No ?Independently performs ADLs?: Yes (appropriate for developmental age) ?Does the patient have difficulty walking or climbing stairs?: No ?Weakness of Legs: Both ?Weakness of Arms/Hands: Both ? ?Permission Sought/Granted ?  ?Permission granted to share information with : No ?   ?   ?   ?   ? ?Emotional Assessment ?Appearance:: Appears stated age ?Attitude/Demeanor/Rapport: Engaged ?Affect (typically observed): Accepting ?Orientation: : Oriented to Self, Oriented to Place, Oriented to  Time, Oriented to Situation ?Alcohol / Substance Use: Not Applicable ?Psych Involvement: No (comment) ? ?Admission diagnosis:  CAP (community acquired pneumonia) [J18.9] ?Dyspnea, unspecified type [R06.00] ?Community acquired pneumonia of right lung, unspecified part of lung [J18.9] ?Sepsis, due to unspecified organism, unspecified whether acute organ dysfunction present ([place]) [A41.9] ?Patient Active Problem List  ? Diagnosis Date Noted  ? Severe Sepsis with Septic Shock due to Rt Sided PNA  06/05/2021  ? Nontraumatic complete tear of right rotator cuff   ? Impingement syndrome of right shoulder 12/01/2016  ? Impingement syndrome of left shoulder 12/01/2016  ? HNP (herniated nucleus  pulposus), lumbar 05/05/2016  ? Hyperkalemia 12/28/2015  ? COPD exacerbation ([place]) 12/25/2015  ? Anemia 12/25/2015  ? Shortness of breath 12/25/2015  ? Cardiomegaly 12/25/2015  ? Bipolar 1 disorder, mixed, moderate ([place]) 06/03/2015  ? CAP (community acquired pneumonia) 04/30/2015  ? Pneumonitis 04/30/2015  ? Status  asthmaticus 04/30/2015  ? Acute respiratory failure with hypoxia ([place]) 04/30/2015  ? Dyspnea 04/30/2015  ? Hyponatremia, severe 04/30/2015  ? Lumbar spondylosis 04/27/2011  ? Screening for colon cancer 01/17/2011  ? Abnormal EKG 01/17/2011  ? Tobacco abuse 01/17/2011  ? Tobacco abuse counseling 01/17/2011  ? Hypertension   ? Chronic low back pain   ? ?PCP:  Helen Hashimoto., MD ?Pharmacy:   ?[id number] - [place], [address] ?[address] ?[place] [address] ?Phone: [phone] Fax: [phone] ? ? ? ? ?Social Determinants of Health (SDOH) Interventions ?  ? ?Readmission Risk Interventions ?   ? View : No data to display.  ?  ?  ?  ? ? ? ?

## 2021-06-07 NOTE — Plan of Care (Signed)
  Problem: Safety: Goal: Ability to remain free from injury will improve Outcome: Progressing   

## 2021-06-08 LAB — CBC
HCT: 24.9 % — ABNORMAL LOW (ref 36.0–46.0)
Hemoglobin: 8 g/dL — ABNORMAL LOW (ref 12.0–15.0)
MCH: 29.7 pg (ref 26.0–34.0)
MCHC: 32.1 g/dL (ref 30.0–36.0)
MCV: 92.6 fL (ref 80.0–100.0)
Platelets: 681 10*3/uL — ABNORMAL HIGH (ref 150–400)
RBC: 2.69 MIL/uL — ABNORMAL LOW (ref 3.87–5.11)
RDW: 14.9 % (ref 11.5–15.5)
WBC: 15 10*3/uL — ABNORMAL HIGH (ref 4.0–10.5)
nRBC: 0 % (ref 0.0–0.2)

## 2021-06-08 LAB — BASIC METABOLIC PANEL
Anion gap: 11 (ref 5–15)
BUN: 13 mg/dL (ref 8–23)
CO2: 22 mmol/L (ref 22–32)
Calcium: 8.7 mg/dL — ABNORMAL LOW (ref 8.9–10.3)
Chloride: 97 mmol/L — ABNORMAL LOW (ref 98–111)
Creatinine, Ser: 0.77 mg/dL (ref 0.44–1.00)
GFR, Estimated: >60 mL/min (ref >60)
Glucose, Bld: 109 mg/dL — ABNORMAL HIGH (ref 70–99)
Potassium: 4 mmol/L (ref 3.5–5.1)
Sodium: 130 mmol/L — ABNORMAL LOW (ref 135–145)

## 2021-06-08 LAB — SURGICAL PCR SCREEN
MRSA, PCR: NEGATIVE
Staphylococcus aureus: NEGATIVE

## 2021-06-08 MED ORDER — PEG 3350-KCL-NA BICARB-NACL 420 G PO SOLR
4000.0000 mL | Freq: Once | ORAL | Status: AC
  Administered 2021-06-08: 4000 mL via ORAL
  Filled 2021-06-08: qty 4000

## 2021-06-08 NOTE — Progress Notes (Signed)
?PROGRESS NOTE ? ?Madeline Dickerson  LKG:[id number] DOB: [birth year] DOA: 06/03/2021 ?PCP: Helen Hashimoto., MD  ? ?Brief Narrative: ? ?Patient is a 61 year old female with history of COPD/asthma, hypertension, diastolic congestive heart failure who presented initially with cough, shortness of breath.  Found to be hypotensive on arrival .  Chest imaging on presentation showed areas of consolidation in the mid right lung.  Patient was admitted for the management of severe sepsis/septic shock secondary to pneumonia, COPD exacerbation.  Initially admitted under ICU service, transferred to [facility] service after hemodynamic stability.  Overall status is getting better.  Currently on room air.  Hospital course also remarkable for finding of anemia, iron deficiency, FOBT positive.  GI consulted for concern of GI bleed. ? ?Assessment & Plan: ? ?Principal Problem: ?  Severe Sepsis with Septic Shock due to Rt Sided PNA  ?Active Problems: ?  CAP (community acquired pneumonia) ?  Acute respiratory failure with hypoxia ([place]) ?  Hypertension ?  Bipolar 1 disorder, mixed, moderate (HCC) ?  Anemia ? ?Severe sepsis/septic shock: Presented with hypotension, leukocytosis, lactic acidosis.  Admitted under ICU service, needed pressures on presentation.  Currently hemodynamically stable.  Respiratory viral panel, COVID-negative, streptococcal pneumonia antigen negative.  Blood cultures no growth till date.  Leukocytosis improving ? ?Right middle lobe pneumonia: Currently on ceftriaxone azithromycin.  Respiratory status getting better.  Currently on room air ? ?Iron deficiency anemia: Hemoglobin in the range of 7-8.  Hemoglobin was 10.7 on 4/13 and 14.3 on 10/09/2019.  Denies any hematochezia or melena.  Iron studies showed severe iron deficiency.  Given a dose of IV iron , continue oral supplementation on discharge.  FOBT positive.  GI consulted today.  She follows with Dr. Benson Norway, she might need EGD/colonoscopy.  Her last colonoscopy was about a  decade ago ? ?History of COPD: Not on home oxygen.  Has inhalers at home.  Requesting for nebulizer machine.  Currently on room air. ?On prednisone.  Will recommend to follow-up with pulmonology as an outpatient for PFT. ? ?Tobacco use: Down to 2 cigarettes a day.   counseled for complete stop.  On nicotine patch ? ?History of bipolar disorder/depression/fibromyalgia: On Seroquel, Prozac, Xanax as needed ? ?Hyponatremia: Stable.  Could be from SIADH from pneumonia ? ?AKI: Resolved with IV fluids ? ?Hypokalemia: Supplemented and corrected ? ?Debility/deconditioning: Lives with a friend.  Complains of  weakness.  PT recommended home health on discharge ? ? ?  ? ? ?  ?  ? ?DVT prophylaxis:heparin injection 5,000 Units Start: 06/05/21 1200 ?SCDs Start: 06/04/21 0138 ? ? ?  Code Status: Full Code ? ?Family Communication: None at bedside ? ?Patient status: Inpatient ? ?Patient is from : Home ? ?Anticipated discharge to: Home ? ?Estimated DC date: After GI work-up ? ?Consultants: PCCM ? ?Procedures: None ? ?Antimicrobials:  ?Anti-infectives (From admission, onward)  ? ? Start     Dose/Rate Route Frequency Ordered Stop  ? 06/06/21 1000  azithromycin (ZITHROMAX) tablet 500 mg       ? 500 mg Oral Daily 06/06/21 0800    ? 06/04/21 2200  cefTRIAXone (ROCEPHIN) 2 g in sodium chloride 0.9 % 100 mL IVPB       ? 2 g ?200 mL/hr over 30 Minutes Intravenous Every 24 hours 06/04/21 0146    ? 06/04/21 2200  azithromycin (ZITHROMAX) 500 mg in sodium chloride 0.9 % 250 mL IVPB  Status:  Discontinued       ? 500 mg ?250 mL/hr over 60  Minutes Intravenous Every 24 hours 06/04/21 0146 06/06/21 0800  ? 06/03/21 2315  azithromycin (ZITHROMAX) 500 mg in sodium chloride 0.9 % 250 mL IVPB       ? 500 mg ?250 mL/hr over 60 Minutes Intravenous  Once 06/03/21 2307 06/04/21 0133  ? 06/03/21 2230  cefTRIAXone (ROCEPHIN) 2 g in sodium chloride 0.9 % 100 mL IVPB       ? 2 g ?200 mL/hr over 30 Minutes Intravenous  Once 06/03/21 2224 06/04/21 0012  ? ?   ? ? ?Subjective: ?Patient seen and examined at the bedside this morning.  Hemodynamically stable.  Lying on bed.  On room air.  Denies any worsening shortness of breath.  Still having some cough but not bad. ? ?Objective: ?Vitals:  ? 06/08/21 0500 06/08/21 0526 06/08/21 0829 06/08/21 0850  ?BP:  119/75 (!) 155/70   ?Pulse:  68 66 75  ?Resp:  '18 18 17  '$ ?Temp:  97.8 ?F (36.6 ?C) 98.2 ?F (36.8 ?C)   ?TempSrc:  Oral Oral   ?SpO2:  99% 95% 93%  ?Weight: 85 kg     ?Height:      ? ? ?Intake/Output Summary (Last 24 hours) at 06/08/2021 1126 ?Last data filed at 06/07/2021 1636 ?Gross per 24 hour  ?Intake 357 ml  ?Output --  ?Net 357 ml  ? ?Filed Weights  ? 06/04/21 0750 06/07/21 0500 06/08/21 0500  ?Weight: 80.3 kg 82.9 kg 85 kg  ? ? ?Examination: ? ?General exam: Overall comfortable, not in distress,obese ?HEENT: PERRL ?Respiratory system: Diminished sounds on the right side ,no wheezes or crackles  ?Cardiovascular system: S1 & S2 heard, RRR.  ?Gastrointestinal system: Abdomen is nondistended, soft and nontender. ?Central nervous system: Alert and oriented ?Extremities: No edema, no clubbing ,no cyanosis ?Skin: No rashes, no ulcers,no icterus   ? ? ?Data Reviewed: I have personally reviewed following labs and imaging studies ? ?CBC: ?Recent Labs  ?Lab 06/03/21 ?2122 06/03/21 ?2311 06/04/21 ?3295 06/06/21 ?0130 06/07/21 ?1884 06/08/21 ?0040  ?WBC 27.4*  --  29.3* 19.9* 16.4* 15.0*  ?HGB 10.7* 12.6 9.8* 7.7* 7.8* 8.0*  ?HCT 33.9* 37.0 30.5* 24.1* 25.4* 24.9*  ?MCV 93.9  --  91.6 92.7 94.4 92.6  ?PLT 701*  --  788* 601* 665* 681*  ? ?Basic Metabolic Panel: ?Recent Labs  ?Lab 06/04/21 ?1660 06/05/21 ?0256 06/06/21 ?0130 06/07/21 ?6301 06/07/21 ?1041 06/08/21 ?0040  ?NA 126* 133* 131* 130* 132* 130*  ?K 3.6 4.2 3.4* 4.1 3.9 4.0  ?CL 97* 104 98 99 100 97*  ?CO2 19* 20* 24 20* 23 22  ?GLUCOSE 124* 114* 105* 173* 162* 109*  ?BUN 31* '13 9 9 9 13  '$ ?CREATININE 1.18* 0.82 0.84 0.82 0.73 0.77  ?CALCIUM 8.4* 8.3* 7.9* 8.4* 8.6* 8.7*  ?MG  1.7 1.7 1.7  --   --   --   ?PHOS  --   --  4.0  --   --   --   ? ? ? ?Recent Results (from the past 240 hour(s))  ?Resp Panel by RT-PCR (Flu A&B, Covid) Nasopharyngeal Swab     Status: None  ? Collection Time: 06/04/21  1:00 AM  ? Specimen: Nasopharyngeal Swab; Nasopharyngeal(NP) swabs in vial transport medium  ?Result Value Ref Range Status  ? SARS Coronavirus 2 by RT PCR NEGATIVE NEGATIVE Final  ?  Comment: (NOTE) ?SARS-CoV-2 target nucleic acids are NOT DETECTED. ? ?The SARS-CoV-2 RNA is generally detectable in upper respiratory ?specimens during the acute phase of infection.  The lowest ?concentration of SARS-CoV-2 viral copies this assay can detect is ?138 copies/mL. A negative result does not preclude SARS-Cov-2 ?infection and should not be used as the sole basis for treatment or ?other patient management decisions. A negative result may occur with  ?improper specimen collection/handling, submission of specimen other ?than nasopharyngeal swab, presence of viral mutation(s) within the ?areas targeted by this assay, and inadequate number of viral ?copies(<138 copies/mL). A negative result must be combined with ?clinical observations, patient history, and epidemiological ?information. The expected result is Negative. ? ?Fact Sheet for Patients:  ?EntrepreneurPulse.com.au ? ?Fact Sheet for Healthcare Providers:  ?IncredibleEmployment.be ? ?This test is no t yet approved or cleared by the [place] FDA and  ?has been authorized for detection and/or diagnosis of SARS-CoV-2 by ?FDA under an Emergency Use Authorization (EUA). This EUA will remain  ?in effect (meaning this test can be used) for the duration of the ?COVID-19 declaration under Section 564(b)(1) of the Act, 21 ?U.S.C.section 360bbb-3(b)(1), unless the authorization is terminated  ?or revoked sooner.  ? ? ?  ? Influenza A by PCR NEGATIVE NEGATIVE Final  ? Influenza B by PCR NEGATIVE NEGATIVE Final  ?  Comment:  (NOTE) ?The Xpert Xpress SARS-CoV-2/FLU/RSV plus assay is intended as an aid ?in the diagnosis of influenza from Nasopharyngeal swab specimens and ?should not be used as a sole basis for treatment. Nasal washin

## 2021-06-08 NOTE — H&P (View-Only) (Signed)
Reason for Consult: Anemia and heme positive stool ?Referring Physician: [facility] ? ?Madeline Dickerson ?HPI: This is a 61 year old female with a PMH of CHF, GERD, HTN, and COPD initially admitted for sepsis, requiring intubation, secondary to a right lung pneumonia.  She was treated aggressively and stabilized.  The patient continued to improve and over the course of the hospitalization she was found to be anemic.  Her baseline values range between 13-14 g/dL, but now she is at 8.0 g/dL.  A hemoccult was performed and it was positive for blood.  She denies any hematochezia or melena.  In 11/2019 she was evaluated in the office and the plan was for her to undergo a routine colonoscopy and an EGD.  The EGD was for her complaints of random vomiting.  She cancelled the procedures as she developed a rotator cuff tear.  She stated that she was going to call back, but she never called.  Her colonoscopy on 05/28/2009 was normal. ? ?Past Medical History:  ?Diagnosis Date  ? Anxiety   ? hx  ? Arthritis   ? "lower back, hands" (05/01/2015)  ? Chronic low back pain   ? disk s/p 4 diskectomies, 5th fursion, then spinal cord stimulator ([place])  ? Depression   ? Dysrhythmia   ? irruglar heart beat "nothing wrong"   ? Fibromyalgia   ? GERD (gastroesophageal reflux disease)   ? Hypertension   ? Pneumonia 05/01/2015  ? PONV (postoperative nausea and vomiting)   ? Rotator cuff disorder   ? Seasonal asthma   ? "seasonal"  ? Shortness of breath dyspnea   ? Walking pneumonia   ? ? ?Past Surgical History:  ?Procedure Laterality Date  ? BACK SURGERY    ? [place]  ? LUMBAR MICRODISCECTOMY  01/1990; 2003  ? LUMBAR SPINE SURGERY  03/1991  ? "cleaned up scar tissue"  ? POSTERIOR LUMBAR FUSION  2011; 04/27/2011  ? REPAIR DURAL / CSF LEAK  02/1990  ? SHOULDER ARTHROSCOPY Right 12/10/2019  ? Procedure: RIGHT SHOULDER ARTHROSCOPY AND DEBRIDEMENT;  Surgeon: Newt Minion, MD;  Location: [place];  Service:  Orthopedics;  Laterality: Right;  ? SPINAL CORD STIMULATOR IMPLANT  2012  ? SPINAL CORD STIMULATOR REMOVAL  08/2010  ? TUBAL LIGATION  1986  ? ? ?Family History  ?Problem Relation Age of Onset  ? Stroke Father   ? Hypertension Father   ? Lung cancer Father   ? ? ?Social History:  reports that she has been smoking cigarettes. She has a 42.00 pack-year smoking history. She has never used smokeless tobacco. She reports current drug use. Drug: Marijuana. She reports that she does not drink alcohol. ? ?Allergies:  ?Allergies  ?Allergen Reactions  ? Haldol [Haloperidol Lactate] Other (See Comments)  ?  Pt states that she was unable to talk or move.   ? ? ?Medications: Scheduled: ? azithromycin  500 mg Oral Daily  ? buprenorphine-naloxone  1 tablet Sublingual BID  ? dextromethorphan-guaiFENesin  1 tablet Oral BID  ? ferrous sulfate  325 mg Oral Q breakfast  ? FLUoxetine  40 mg Oral Daily  ? fluticasone furoate-vilanterol  1 puff Inhalation Daily  ? heparin  5,000 Units Subcutaneous Q8H  ? melatonin  3 mg Oral QHS  ? nicotine  21 mg Transdermal Daily  ? pantoprazole  40 mg Oral Daily  ? polyethylene glycol  17 g Oral Daily  ? polyethylene glycol-electrolytes  4,000 mL Oral Once  ?  predniSONE  40 mg Oral Daily  ? QUEtiapine  100 mg Oral QHS  ? senna-docusate  2 tablet Oral QHS  ? umeclidinium bromide  1 puff Inhalation Daily  ? ?Continuous: ? cefTRIAXone (ROCEPHIN)  IV 2 g (06/07/21 2102)  ? ? ?Results for orders placed or performed during the hospital encounter of 06/03/21 (from the past 24 hour(s))  ?CBC     Status: Abnormal  ? Collection Time: 06/08/21 12:40 AM  ?Result Value Ref Range  ? WBC 15.0 (H) 4.0 - 10.5 K/uL  ? RBC 2.69 (L) 3.87 - 5.11 MIL/uL  ? Hemoglobin 8.0 (L) 12.0 - 15.0 g/dL  ? HCT 24.9 (L) 36.0 - 46.0 %  ? MCV 92.6 80.0 - 100.0 fL  ? MCH 29.7 26.0 - 34.0 pg  ? MCHC 32.1 30.0 - 36.0 g/dL  ? RDW 14.9 11.5 - 15.5 %  ? Platelets 681 (H) 150 - 400 K/uL  ? nRBC 0.0 0.0 - 0.2 %  ?Basic metabolic panel     Status:  Abnormal  ? Collection Time: 06/08/21 12:40 AM  ?Result Value Ref Range  ? Sodium 130 (L) 135 - 145 mmol/L  ? Potassium 4.0 3.5 - 5.1 mmol/L  ? Chloride 97 (L) 98 - 111 mmol/L  ? CO2 22 22 - 32 mmol/L  ? Glucose, Bld 109 (H) 70 - 99 mg/dL  ? BUN 13 8 - 23 mg/dL  ? Creatinine, Ser 0.77 0.44 - 1.00 mg/dL  ? Calcium 8.7 (L) 8.9 - 10.3 mg/dL  ? GFR, Estimated >60 >60 mL/min  ? Anion gap 11 5 - 15  ?  ? ?No results found. ? ?ROS:  As stated above in the HPI otherwise negative. ? ?Blood pressure (!) 155/70, pulse 75, temperature 98.2 ?F (36.8 ?C), temperature source Oral, resp. rate 17, height '5\' 6"'$  (1.676 m), weight 85 kg, last menstrual period 08/28/2012, SpO2 93 %.   ? ?PE: ?Gen: NAD, Alert and Oriented ?HEENT:  [place]/AT, EOMI ?Neck: Supple, no LAD ?Lungs: CTA Bilaterally ?CV: RRR without M/G/R ?ABD: Soft, NTND, +BS ?Ext: No C/C/E ? ?Assessment/Plan: ?1) Anemia. ?2) Heme positive stool. ?3) Pneumonia - resolved. ?4) HTN. ? ? There is a definite decline in her HGB and she is heme positive.  An EGD/colonoscopy will be performed.  She is stable from the pulmonary standpoint. ? ?Plan: ?1) EGD/colonoscopy. ? ?Rhiannon Sassaman D ?06/08/2021, 3:56 PM  ? ? ?  ?

## 2021-06-08 NOTE — Progress Notes (Signed)
Date and time results received:  06/07/2021 2358 ? ? ?Test: hemoccult  ?Critical Value: postive ? ?Name of Provider Notified: Clarene Essex NP  ? ?Orders Received? Or Actions Taken?: No orders received assessed labs await am results ?

## 2021-06-08 NOTE — Progress Notes (Signed)
Mobility Specialist Progress Note: ? ? 06/08/21 1200  ?Mobility  ?Activity Ambulated with assistance in hallway  ?Level of Assistance Modified independent, requires aide device or extra time  ?Assistive Device None  ?Distance Ambulated (ft) 550 ft  ?Activity Response Tolerated well  ?$Mobility charge 1 Mobility  ? ?Pt received in bed willing to participate in mobility. No complaints of pain. Left in bed with call bell in reach and all needs met.  ? ?Jaidon Sponsel ?Mobility Specialist ?Primary Phone [phone] ? ?

## 2021-06-08 NOTE — Consult Note (Signed)
Reason for Consult: Anemia and heme positive stool ?Referring Physician: [facility] ? ?Madeline Dickerson ?HPI: This is a 61 year old female with a PMH of CHF, GERD, HTN, and COPD initially admitted for sepsis, requiring intubation, secondary to a right lung pneumonia.  She was treated aggressively and stabilized.  The patient continued to improve and over the course of the hospitalization she was found to be anemic.  Her baseline values range between 13-14 g/dL, but now she is at 8.0 g/dL.  A hemoccult was performed and it was positive for blood.  She denies any hematochezia or melena.  In 11/2019 she was evaluated in the office and the plan was for her to undergo a routine colonoscopy and an EGD.  The EGD was for her complaints of random vomiting.  She cancelled the procedures as she developed a rotator cuff tear.  She stated that she was going to call back, but she never called.  Her colonoscopy on 05/28/2009 was normal. ? ?Past Medical History:  ?Diagnosis Date  ? Anxiety   ? hx  ? Arthritis   ? "lower back, hands" (05/01/2015)  ? Chronic low back pain   ? disk s/p 4 diskectomies, 5th fursion, then spinal cord stimulator ([place])  ? Depression   ? Dysrhythmia   ? irruglar heart beat "nothing wrong"   ? Fibromyalgia   ? GERD (gastroesophageal reflux disease)   ? Hypertension   ? Pneumonia 05/01/2015  ? PONV (postoperative nausea and vomiting)   ? Rotator cuff disorder   ? Seasonal asthma   ? "seasonal"  ? Shortness of breath dyspnea   ? Walking pneumonia   ? ? ?Past Surgical History:  ?Procedure Laterality Date  ? BACK SURGERY    ? [place]  ? LUMBAR MICRODISCECTOMY  01/1990; 2003  ? LUMBAR SPINE SURGERY  03/1991  ? "cleaned up scar tissue"  ? POSTERIOR LUMBAR FUSION  2011; 04/27/2011  ? REPAIR DURAL / CSF LEAK  02/1990  ? SHOULDER ARTHROSCOPY Right 12/10/2019  ? Procedure: RIGHT SHOULDER ARTHROSCOPY AND DEBRIDEMENT;  Surgeon: Newt Minion, MD;  Location: [place];  Service:  Orthopedics;  Laterality: Right;  ? SPINAL CORD STIMULATOR IMPLANT  2012  ? SPINAL CORD STIMULATOR REMOVAL  08/2010  ? TUBAL LIGATION  1986  ? ? ?Family History  ?Problem Relation Age of Onset  ? Stroke Father   ? Hypertension Father   ? Lung cancer Father   ? ? ?Social History:  reports that she has been smoking cigarettes. She has a 42.00 pack-year smoking history. She has never used smokeless tobacco. She reports current drug use. Drug: Marijuana. She reports that she does not drink alcohol. ? ?Allergies:  ?Allergies  ?Allergen Reactions  ? Haldol [Haloperidol Lactate] Other (See Comments)  ?  Pt states that she was unable to talk or move.   ? ? ?Medications: Scheduled: ? azithromycin  500 mg Oral Daily  ? buprenorphine-naloxone  1 tablet Sublingual BID  ? dextromethorphan-guaiFENesin  1 tablet Oral BID  ? ferrous sulfate  325 mg Oral Q breakfast  ? FLUoxetine  40 mg Oral Daily  ? fluticasone furoate-vilanterol  1 puff Inhalation Daily  ? heparin  5,000 Units Subcutaneous Q8H  ? melatonin  3 mg Oral QHS  ? nicotine  21 mg Transdermal Daily  ? pantoprazole  40 mg Oral Daily  ? polyethylene glycol  17 g Oral Daily  ? polyethylene glycol-electrolytes  4,000 mL Oral Once  ?  predniSONE  40 mg Oral Daily  ? QUEtiapine  100 mg Oral QHS  ? senna-docusate  2 tablet Oral QHS  ? umeclidinium bromide  1 puff Inhalation Daily  ? ?Continuous: ? cefTRIAXone (ROCEPHIN)  IV 2 g (06/07/21 2102)  ? ? ?Results for orders placed or performed during the hospital encounter of 06/03/21 (from the past 24 hour(s))  ?CBC     Status: Abnormal  ? Collection Time: 06/08/21 12:40 AM  ?Result Value Ref Range  ? WBC 15.0 (H) 4.0 - 10.5 K/uL  ? RBC 2.69 (L) 3.87 - 5.11 MIL/uL  ? Hemoglobin 8.0 (L) 12.0 - 15.0 g/dL  ? HCT 24.9 (L) 36.0 - 46.0 %  ? MCV 92.6 80.0 - 100.0 fL  ? MCH 29.7 26.0 - 34.0 pg  ? MCHC 32.1 30.0 - 36.0 g/dL  ? RDW 14.9 11.5 - 15.5 %  ? Platelets 681 (H) 150 - 400 K/uL  ? nRBC 0.0 0.0 - 0.2 %  ?Basic metabolic panel     Status:  Abnormal  ? Collection Time: 06/08/21 12:40 AM  ?Result Value Ref Range  ? Sodium 130 (L) 135 - 145 mmol/L  ? Potassium 4.0 3.5 - 5.1 mmol/L  ? Chloride 97 (L) 98 - 111 mmol/L  ? CO2 22 22 - 32 mmol/L  ? Glucose, Bld 109 (H) 70 - 99 mg/dL  ? BUN 13 8 - 23 mg/dL  ? Creatinine, Ser 0.77 0.44 - 1.00 mg/dL  ? Calcium 8.7 (L) 8.9 - 10.3 mg/dL  ? GFR, Estimated >60 >60 mL/min  ? Anion gap 11 5 - 15  ?  ? ?No results found. ? ?ROS:  As stated above in the HPI otherwise negative. ? ?Blood pressure (!) 155/70, pulse 75, temperature 98.2 ?F (36.8 ?C), temperature source Oral, resp. rate 17, height '5\' 6"'$  (1.676 m), weight 85 kg, last menstrual period 08/28/2012, SpO2 93 %.   ? ?PE: ?Gen: NAD, Alert and Oriented ?HEENT:  [place]/AT, EOMI ?Neck: Supple, no LAD ?Lungs: CTA Bilaterally ?CV: RRR without M/G/R ?ABD: Soft, NTND, +BS ?Ext: No C/C/E ? ?Assessment/Plan: ?1) Anemia. ?2) Heme positive stool. ?3) Pneumonia - resolved. ?4) HTN. ? ? There is a definite decline in her HGB and she is heme positive.  An EGD/colonoscopy will be performed.  She is stable from the pulmonary standpoint. ? ?Plan: ?1) EGD/colonoscopy. ? ?Mieczyslaw Stamas D ?06/08/2021, 3:56 PM  ? ? ?  ?

## 2021-06-08 NOTE — Progress Notes (Signed)
Physical Therapy Treatment & Discharge ?Patient Details ?Name: Madeline Dickerson ?MRN: [id number] ?DOB: Feb 04, 1961 ?Today's Date: 06/08/2021 ? ? ?History of Present Illness Pt is a 61 y/o female who presents 06/03/21 with cough/SOB and found with hypotension with SBP in 80s in the ED. Workup for septic shock secondary to pneumonia. GI consulted due to anemia, potential for EGD/colonoscopy. PMH includes several back sxs, fibromyalgia, HTN, rotator cuff disorder. ?  ?PT Comments  ? ? Pt progressing with mobility. Pt demonstrates improving stability and activity tolerance; independent with ambulation, mod indep with stairs and rail support. Pt motivated to mobilize and has been doing so consistently. Pt has met short-term acute PT goals; reviewed education, pt reports no further questions or concerns. Will d/c acute PT.  ?   ?Recommendations for follow up therapy are one component of a multi-disciplinary discharge planning process, led by the attending physician.  Recommendations may be updated based on patient status, additional functional criteria and insurance authorization. ? ?Follow Up Recommendations ? No PT follow up ?  ?  ?Assistance Recommended at Discharge PRN  ?Patient can return home with the following Assistance with cooking/housework ?  ?Equipment Recommendations ? None recommended by PT  ?  ?Recommendations for Other Services   ? ? ?  ?Precautions / Restrictions Precautions ?Precautions: None ?Restrictions ?Weight Bearing Restrictions: No  ?  ? ?Mobility ? Bed Mobility ?  ?  ?  ?  ?  ?  ?  ?General bed mobility comments: received sitting in recliner ?  ? ?Transfers ?Overall transfer level: Independent ?Equipment used: None ?  ?  ?  ?  ?  ?  ?  ?  ?  ? ?Ambulation/Gait ?Ambulation/Gait assistance: Independent ?Gait Distance (Feet): 640 Feet ?Assistive device: None ?Gait Pattern/deviations: Step-through pattern, Decreased stride length, Wide base of support ?  ?  ?  ?General Gait Details: Slow, steady gait indep  without DME; no overt instability or LOB. DOE 2-3/4, pt conversant entire walk; SpO2 >/95% on RA ? ? ?Stairs ?Stairs: Yes ?Stairs assistance: Modified independent (Device/Increase time) ?Stair Management: One rail Left, Forwards, Alternating pattern ?Number of Stairs: 11 ?General stair comments: pt ascend/descended 11 steps with single UE rail support, mod indep; slow, guarded technique ? ? ?Wheelchair Mobility ?  ? ?Modified Rankin (Stroke Patients Only) ?  ? ? ?  ?Balance Overall balance assessment: No apparent balance deficits (not formally assessed) ?Sitting-balance support: No upper extremity supported, Feet supported ?Sitting balance-Leahy Scale: Good ?  ?  ?Standing balance support: No upper extremity supported, During functional activity ?Standing balance-Leahy Scale: Good ?  ?  ?  ?  ?  ?  ?  ?  ?  ?  ?  ?  ?  ? ?  ?Cognition Arousal/Alertness: Awake/alert ?Behavior During Therapy: [facility] for tasks assessed/performed ?Overall Cognitive Status: Within Functional Limits for tasks assessed ?  ?  ?  ?  ?  ?  ?  ?  ?  ?  ?  ?  ?  ?  ?  ?  ?  ?  ?  ? ?  ?Exercises   ? ?  ?General Comments General comments (skin integrity, edema, etc.): reviewed educ on activity recommendations, pt reports no further questions or concerns ?  ?  ? ?Pertinent Vitals/Pain Pain Assessment ?Pain Assessment: No/denies pain  ? ? ?Home Living   ?  ?  ?  ?  ?  ?  ?  ?  ?  ?   ?  ?  Prior Function    ?  ?  ?   ? ?PT Goals (current goals can now be found in the care plan section) Progress towards PT goals: Goals met/education completed, patient discharged from PT ? ?  ?Frequency ? ? ? Min 3X/week ? ? ? ?  ?PT Plan Discharge plan needs to be updated  ? ? ?Co-evaluation   ?  ?  ?  ?  ? ?  ?AM-PAC PT "6 Clicks" Mobility   ?Outcome Measure ? Help needed turning from your back to your side while in a flat bed without using bedrails?: None ?Help needed moving from lying on your back to sitting on the side of a flat bed without using bedrails?:  None ?Help needed moving to and from a bed to a chair (including a wheelchair)?: None ?Help needed standing up from a chair using your arms (e.g., wheelchair or bedside chair)?: None ?Help needed to walk in hospital room?: None ?Help needed climbing 3-5 steps with a railing? : None ?6 Click Score: 24 ? ?  ?End of Session   ?Activity Tolerance: Patient tolerated treatment well ?Patient left: in chair;with call bell/phone within reach;with nursing/sitter in room ?Nurse Communication: Mobility status ?PT Visit Diagnosis: Unsteadiness on feet (R26.81);Muscle weakness (generalized) (M62.81) ?  ? ? ?Time: 8270-7867 ?PT Time Calculation (min) (ACUTE ONLY): 12 min ? ?Charges:  $Therapeutic Exercise: 8-22 mins          ?          ? ?Mabeline Caras, PT, DPT ?Acute Rehabilitation Services  ?Pager [phone] ?Office [phone] ? ?Derry Lory ?06/08/2021, 4:23 PM ? ?

## 2021-06-09 ENCOUNTER — Inpatient Hospital Stay (HOSPITAL_COMMUNITY): Payer: Medicare Other | Admitting: Certified Registered"

## 2021-06-09 ENCOUNTER — Other Ambulatory Visit (HOSPITAL_COMMUNITY): Payer: Self-pay

## 2021-06-09 ENCOUNTER — Encounter (HOSPITAL_COMMUNITY): Payer: Self-pay | Admitting: Internal Medicine

## 2021-06-09 ENCOUNTER — Encounter (HOSPITAL_COMMUNITY)
Admission: EM | Disposition: A | Discharge status: Home / Self Care | Payer: Self-pay | Source: Home / Self Care | Attending: Internal Medicine

## 2021-06-09 DIAGNOSIS — K635 Polyp of colon: Secondary | ICD-10-CM

## 2021-06-09 DIAGNOSIS — K2100 Gastro-esophageal reflux disease with esophagitis, without bleeding: Secondary | ICD-10-CM

## 2021-06-09 DIAGNOSIS — K449 Diaphragmatic hernia without obstruction or gangrene: Secondary | ICD-10-CM

## 2021-06-09 DIAGNOSIS — D509 Iron deficiency anemia, unspecified: Secondary | ICD-10-CM

## 2021-06-09 HISTORY — PX: ESOPHAGOGASTRODUODENOSCOPY (EGD) WITH PROPOFOL: SHX5813

## 2021-06-09 HISTORY — PX: COLONOSCOPY WITH PROPOFOL: SHX5780

## 2021-06-09 HISTORY — PX: POLYPECTOMY: SHX5525

## 2021-06-09 LAB — BASIC METABOLIC PANEL
Anion gap: 9 (ref 5–15)
BUN: 12 mg/dL (ref 8–23)
CO2: 25 mmol/L (ref 22–32)
Calcium: 9.2 mg/dL (ref 8.9–10.3)
Chloride: 100 mmol/L (ref 98–111)
Creatinine, Ser: 0.66 mg/dL (ref 0.44–1.00)
GFR, Estimated: >60 mL/min (ref >60)
Glucose, Bld: 86 mg/dL (ref 70–99)
Potassium: 4 mmol/L (ref 3.5–5.1)
Sodium: 134 mmol/L — ABNORMAL LOW (ref 135–145)

## 2021-06-09 LAB — CULTURE, BLOOD (ROUTINE X 2)
Culture: NO GROWTH
Culture: NO GROWTH
Special Requests: ADEQUATE

## 2021-06-09 LAB — CBC
HCT: 30 % — ABNORMAL LOW (ref 36.0–46.0)
Hemoglobin: 9.4 g/dL — ABNORMAL LOW (ref 12.0–15.0)
MCH: 29.2 pg (ref 26.0–34.0)
MCHC: 31.3 g/dL (ref 30.0–36.0)
MCV: 93.2 fL (ref 80.0–100.0)
Platelets: 822 10*3/uL — ABNORMAL HIGH (ref 150–400)
RBC: 3.22 MIL/uL — ABNORMAL LOW (ref 3.87–5.11)
RDW: 14.8 % (ref 11.5–15.5)
WBC: 14.9 10*3/uL — ABNORMAL HIGH (ref 4.0–10.5)
nRBC: 0 % (ref 0.0–0.2)

## 2021-06-09 SURGERY — ESOPHAGOGASTRODUODENOSCOPY (EGD) WITH PROPOFOL
Anesthesia: Monitor Anesthesia Care

## 2021-06-09 MED ORDER — AZITHROMYCIN 500 MG PO TABS
500.0000 mg | ORAL_TABLET | Freq: Once | ORAL | 0 refills | Status: AC
Start: 2021-06-10 — End: 2021-06-10
  Filled 2021-06-09: qty 1, 1d supply, fill #0

## 2021-06-09 MED ORDER — LIDOCAINE 2% (20 MG/ML) 5 ML SYRINGE
INTRAMUSCULAR | Status: DC | PRN
  Administered 2021-06-09: 40 mg via INTRAVENOUS

## 2021-06-09 MED ORDER — PROPOFOL 10 MG/ML IV BOLUS
INTRAVENOUS | Status: DC | PRN
  Administered 2021-06-09 (×2): 30 mg via INTRAVENOUS

## 2021-06-09 MED ORDER — LACTATED RINGERS IV SOLN: INTRAVENOUS | Status: DC | PRN

## 2021-06-09 MED ORDER — PREDNISONE 20 MG PO TABS
40.0000 mg | ORAL_TABLET | Freq: Every day | ORAL | 0 refills | Status: AC
  Filled 2021-06-09: qty 2, 1d supply, fill #0

## 2021-06-09 MED ORDER — PANTOPRAZOLE SODIUM 40 MG PO TBEC
40.0000 mg | DELAYED_RELEASE_TABLET | Freq: Every day | ORAL | 1 refills | Status: DC
  Filled 2021-06-09: qty 30, 30d supply, fill #0

## 2021-06-09 MED ORDER — SODIUM CHLORIDE 0.9 % IV SOLN: INTRAVENOUS | Status: DC

## 2021-06-09 MED ORDER — DM-GUAIFENESIN ER 30-600 MG PO TB12
1.0000 | ORAL_TABLET | Freq: Two times a day (BID) | ORAL | 0 refills | Status: AC | PRN
  Filled 2021-06-09: qty 10, 5d supply, fill #0

## 2021-06-09 MED ORDER — PROPOFOL 500 MG/50ML IV EMUL
INTRAVENOUS | Status: DC | PRN
  Administered 2021-06-09: 125 ug/kg/min via INTRAVENOUS

## 2021-06-09 MED ORDER — FERROUS SULFATE 325 (65 FE) MG PO TABS
325.0000 mg | ORAL_TABLET | Freq: Every day | ORAL | 1 refills | Status: AC
Start: 2021-06-09 — End: 2022-06-09
  Filled 2021-06-09: qty 30, 30d supply, fill #0

## 2021-06-09 SURGICAL SUPPLY — 25 items

## 2021-06-09 NOTE — Op Note (Signed)
[facility] ?Patient Name: Madeline Dickerson ?Procedure Date : 06/09/2021 ?MRN: [id number] ?Attending MD: Carol Ada , MD ?Date of Birth: 27-Dec-1960 ?CSN: [id number] ?Age: 61 ?Admit Type: Inpatient ?Procedure:                Colonoscopy ?Indications:              Iron deficiency anemia ?Providers:                Carol Ada, MD, Doristine Johns, RN, [facility]  ?                          [place], [employer]chant [employer] officer, Silas Flood, CRNA ?Referring MD:              ?Medicines:                Propofol per Anesthesia ?Complications:            No immediate complications. ?Estimated Blood Loss:     Estimated blood loss: none. ?Procedure:                Pre-Anesthesia Assessment: ?                          - Prior to the procedure, a History and Physical  ?                          was performed, and patient medications and  ?                          allergies were reviewed. The patient's tolerance of  ?                          previous anesthesia was also reviewed. The risks  ?                          and benefits of the procedure and the sedation  ?                          options and risks were discussed with the patient.  ?                          All questions were answered, and informed consent  ?                          was obtained. Prior Anticoagulants: The patient has  ?                          taken no previous anticoagulant or antiplatelet  ?                          agents. ASA Grade Assessment: III - A patient with  ?                          severe systemic disease. After reviewing the risks  ?  and benefits, the patient was deemed in  ?                          satisfactory condition to undergo the procedure. ?                          - Sedation was administered by an anesthesia  ?                          professional. Deep sedation was attained. ?                          After obtaining informed consent, the colonoscope  ?                          was passed under direct  vision. Throughout the  ?                          procedure, the patient's blood pressure, pulse, and  ?                          oxygen saturations were monitored continuously. The  ?                          PCF-HQ190L ([id number]) Olympus colonoscope was  ?                          introduced through the anus and advanced to the the  ?                          cecum, identified by appendiceal orifice and  ?                          ileocecal valve. The colonoscopy was technically  ?                          difficult and complex due to poor bowel prep.  ?                          Successful completion of the procedure was aided by  ?                          lavage. The patient tolerated the procedure well.  ?                          The quality of the bowel preparation was evaluated  ?                          using the BBPS [facility] Bowel Preparation Scale)  ?                          with scores of: Right Colon = 1 (portion of mucosa  ?  seen, but other areas not well seen due to  ?                          staining, residual stool and/or opaque liquid),  ?                          Transverse Colon = 2 (minor amount of residual  ?                          staining, small fragments of stool and/or opaque  ?                          liquid, but mucosa seen well) and Left Colon = 2  ?                          (minor amount of residual staining, small fragments  ?                          of stool and/or opaque liquid, but mucosa seen  ?                          well). The total BBPS score equals 5. The quality  ?                          of the bowel preparation was fair. The ileocecal  ?                          valve, appendiceal orifice, and rectum were  ?                          photographed. ?Scope In: 11:57:30 AM ?Scope Out: 12:16:58 PM ?Scope Withdrawal Time: 0 hours 9 minutes 22 seconds  ?Total Procedure Duration: 0 hours 19 minutes 28 seconds  ?Findings: ?     A 12 mm polyp was  found in the ascending colon. The polyp was sessile.  ?     The polyp was removed with a cold snare. Resection and retrieval were  ?     complete. ?     In the proximal colon there was too much vegetable matter to clear.  ?     Washing and movement of the vegetable matter did not reveal any  ?     significant lesions. There was a sessile polyp in the ascending colon  ?     that was removed in a piecemeal fashion. It appeared to be a residual  ?     polyp from a prior polypectomy, however, no polyp was noted in the  ?     ascending colon with her 2011 colonoscopy. ?Impression:               - Preparation of the colon was fair. ?                          - One 12 mm polyp in the ascending colon, removed  ?  with a cold snare. Resected and retrieved. ?Recommendation:           - Return patient to hospital ward for ongoing care. ?                          - Resume regular diet. ?                          - Continue present medications. ?                          - Await pathology results. ?                          - Repeat colonoscopy at the next available  ?                          appointment for surveillance as an outpatient. ?Procedure Code(s):        --- Professional --- ?                          [phone], Colonoscopy, flexible; with removal of  ?                          tumor(s), polyp(s), or other lesion(s) by snare  ?                          technique ?Diagnosis Code(s):        --- Professional --- ?                          K63.5, Polyp of colon ?                          D50.9, Iron deficiency anemia, unspecified ?CPT copyright 2019 American Medical Association. All rights reserved. ?The codes documented in this report are preliminary and upon coder review may  ?be revised to meet current compliance requirements. ?Carol Ada, MD ?Carol Ada, MD ?06/09/2021 12:30:27 PM ?This report has been signed electronically. ?Number of Addenda: 0 ?

## 2021-06-09 NOTE — Op Note (Signed)
[facility] ?Patient Name: Madeline Dickerson ?Procedure Date : 06/09/2021 ?MRN: [id number] ?Attending MD: Carol Ada , MD ?Date of Birth: 1960-09-08 ?CSN: [id number] ?Age: 61 ?Admit Type: Inpatient ?Procedure:                Upper GI endoscopy ?Indications:              Iron deficiency anemia ?Providers:                Carol Ada, MD, Doristine Johns, RN, [facility]  ?                          [place], Merchant [employer] officer, Silas Flood, CRNA ?Referring MD:              ?Medicines:                Propofol per Anesthesia ?Complications:            No immediate complications. ?Estimated Blood Loss:     Estimated blood loss: none. ?Procedure:                Pre-Anesthesia Assessment: ?                          - Prior to the procedure, a History and Physical  ?                          was performed, and patient medications and  ?                          allergies were reviewed. The patient's tolerance of  ?                          previous anesthesia was also reviewed. The risks  ?                          and benefits of the procedure and the sedation  ?                          options and risks were discussed with the patient.  ?                          All questions were answered, and informed consent  ?                          was obtained. Prior Anticoagulants: The patient has  ?                          taken no previous anticoagulant or antiplatelet  ?                          agents. ASA Grade Assessment: III - A patient with  ?                          severe systemic disease. After reviewing the risks  ?  and benefits, the patient was deemed in  ?                          satisfactory condition to undergo the procedure. ?                          - Sedation was administered by an anesthesia  ?                          professional. Deep sedation was attained. ?                          After obtaining informed consent, the endoscope was  ?                          passed under  direct vision. Throughout the  ?                          procedure, the patient's blood pressure, pulse, and  ?                          oxygen saturations were monitored continuously. The  ?                          PCF-HQ190L ([id number]) Olympus colonoscope was  ?                          introduced through the mouth, and advanced to the  ?                          second part of duodenum. The upper GI endoscopy was  ?                          accomplished without difficulty. The patient  ?                          tolerated the procedure well. ?Scope In: ?Scope Out: ?Findings: ?     LA Grade A (one or more mucosal breaks less than 5 mm, not extending  ?     between tops of 2 mucosal folds) esophagitis with no bleeding was found  ?     at the gastroesophageal junction. ?     A 3 cm hiatal hernia was present. ?     The stomach was normal. ?     The examined duodenum was normal. ?Impression:               - LA Grade A reflux esophagitis with no bleeding. ?                          - 3 cm hiatal hernia. ?                          - Normal stomach. ?                          - Normal  examined duodenum. ?                          - No specimens collected. ?Recommendation:           - PPI QD. ?                          - Proceed with the colonoscopy. ?Procedure Code(s):        --- Professional --- ?                          [phone], Esophagogastroduodenoscopy, flexible,  ?                          transoral; diagnostic, including collection of  ?                          specimen(s) by brushing or washing, when performed  ?                          (separate procedure) ?Diagnosis Code(s):        --- Professional --- ?                          K21.00, Gastro-esophageal reflux disease with  ?                          esophagitis, without bleeding ?                          K44.9, Diaphragmatic hernia without obstruction or  ?                          gangrene ?                          D50.9, Iron deficiency anemia,  unspecified ?CPT copyright 2019 American Medical Association. All rights reserved. ?The codes documented in this report are preliminary and upon coder review may  ?be revised to meet current compliance requirements. ?Carol Ada, MD ?Carol Ada, MD ?06/09/2021 12:25:23 PM ?This report has been signed electronically. ?Number of Addenda: 0 ?

## 2021-06-09 NOTE — Anesthesia Preprocedure Evaluation (Addendum)
Anesthesia Evaluation  ?Patient identified by MRN, date of birth, ID band ?Patient awake ? ? ? ?Reviewed: ?Allergy & Precautions, NPO status , Patient's Chart, lab work & pertinent test results ? ?History of Anesthesia Complications ?(+) PONV and history of anesthetic complications ? ?Airway ?Mallampati: II ? ?TM Distance: >3 FB ?Neck ROM: Full ? ? ? Dental ? ?(+) Dental Advisory Given ?  ?Pulmonary ?shortness of breath, asthma , pneumonia, COPD, Current Smoker and Patient abstained from smoking.,  ?  ?Pulmonary exam normal ?breath sounds clear to auscultation ? ? ? ? ? ? Cardiovascular ?hypertension, Pt. on medications ?Normal cardiovascular exam+ dysrhythmias  ?Rhythm:Regular Rate:Normal ? ? ?  ?Neuro/Psych ?PSYCHIATRIC DISORDERS Anxiety Depression Bipolar Disorder  Neuromuscular disease   ? GI/Hepatic ?Neg liver ROS, GERD  ,  ?Endo/Other  ?negative endocrine ROS ? Renal/GU ?negative Renal ROS  ? ?  ?Musculoskeletal ? ?(+) Arthritis , Fibromyalgia - ? Abdominal ?  ?Peds ? Hematology ? ?(+) Blood dyscrasia, anemia ,   ?Anesthesia Other Findings ? ? Reproductive/Obstetrics ? ?  ? ? ? ? ? ? ? ? ? ? ? ? ? ?  ?  ? ? ? ? ? ? ? ?Anesthesia Physical ?Anesthesia Plan ? ?ASA: 3 ? ?Anesthesia Plan: MAC  ? ?Post-op Pain Management:   ? ?Induction: Intravenous ? ?PONV Risk Score and Plan: 2 and Propofol infusion, TIVA and Treatment may vary due to age or medical condition ? ?Airway Management Planned:  ? ?Additional Equipment:  ? ?Intra-op Plan:  ? ?Post-operative Plan:  ? ?Informed Consent: I have reviewed the patients History and Physical, chart, labs and discussed the procedure including the risks, benefits and alternatives for the proposed anesthesia with the patient or authorized representative who has indicated his/her understanding and acceptance.  ? ? ? ?Dental advisory given ? ?Plan Discussed with: CRNA ? ?Anesthesia Plan Comments:   ? ? ? ? ? ? ?Anesthesia Quick Evaluation ? ?

## 2021-06-09 NOTE — Progress Notes (Signed)
Mobility Specialist Progress Note: ? ? 06/09/21 1109  ?Mobility  ?Activity Off unit  ? ?Will follow-up as time allows.  ? ?Madeline Dickerson ?Mobility Specialist ?Primary Phone [phone] ? ?

## 2021-06-09 NOTE — Discharge Summary (Signed)
Physician Discharge Summary  ?Madeline Dickerson:[id number] DOB: 1960-10-30 DOA: 06/03/2021 ? ?PCP: Helen Hashimoto., MD ? ?Admit date: 06/03/2021 ?Discharge date: 06/09/2021 ? ?Admitted From: Home ?Disposition:  Home ? ?Discharge Condition:Stable ?CODE STATUS:FULL ?Diet recommendation: Heart Healthy   ? ?Brief/Interim Summary: ? ?Patient is a 61 year old female with history of COPD/asthma, hypertension, diastolic congestive heart failure who presented initially with cough, shortness of breath.  Found to be hypotensive on arrival .  Chest imaging on presentation showed areas of consolidation in the mid right lung.  Patient was admitted for the management of severe sepsis/septic shock secondary to pneumonia, COPD exacerbation.  Initially admitted under ICU service, transferred to [facility] service after hemodynamic stability.  Overall status is getting better.  Currently on room air.  Hospital course also remarkable for finding of anemia, iron deficiency, FOBT positive.  GI consulted for concern of GI bleed.  She underwent EGD and colonoscopy without finding of any bleeding source.  Medically stable for discharge today. ? ?Following problems were addressed during her hospitalization: ? ?  ?Severe sepsis/septic shock: Presented with hypotension, leukocytosis, lactic acidosis.  Admitted under ICU service, needed pressures on presentation.  Currently hemodynamically stable.  Respiratory viral panel, COVID-negative, streptococcal pneumonia antigen negative.  Blood cultures no growth till date.  Leukocytosis improved.  Antibiotics changed to oral on discharge ?  ?Right middle lobe pneumonia: She was on ceftriaxone ,azithromycin.  Respiratory status getting better.  Currently on room air, antibiotics changed to oral ?  ?Iron deficiency anemia: Hemoglobin in the range of 7-8.  Hemoglobin was 10.7 on 4/13 and 14.3 on 10/09/2019.  Denies any hematochezia or melena.  Iron studies showed severe iron deficiency.  Given a dose of IV iron  , continue oral supplementation on discharge.  FOBT positive.  GI consulted and she underwent EGD and colonoscopy and found to have LA Grade A esophagitis and a hiatal hernia.  The colon had a polyp but is poorly prepped.  GI recommended outpatient follow-up for repeat colonoscopy.  She follows with Dr. Benson Norway.  Started on Protonix ? ?History of COPD: Not on home oxygen.  Has inhalers at home.  Requesting for nebulizer machine.  Currently on room air. ?Treated with prednisone.  Will recommend to follow-up with pulmonology as an outpatient for PFT. ?  ?Tobacco use: Down to 2 cigarettes a day.   counseled for complete stop.  On nicotine patch ?  ?History of bipolar disorder/depression/fibromyalgia: On Seroquel, Prozac, Xanax as needed ?  ?Hyponatremia: Stable.  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  Improved ?  ?AKI: Resolved with IV fluids ?  ?Hypokalemia: Supplemented and corrected ?  ?Debility/deconditioning: Lives with a friend.  Complains of  weakness.  PT recommended home health on discharge ?  ?  ? ? ? ?Discharge Diagnoses:  ?Principal Problem: ?  Severe Sepsis with Septic Shock due to Rt Sided PNA  ?Active Problems: ?  CAP (community acquired pneumonia) ?  Acute respiratory failure with hypoxia ([place]) ?  Hypertension ?  Bipolar 1 disorder, mixed, moderate (HCC) ?  Anemia ? ? ? ?Discharge Instructions ? ?Discharge Instructions   ? ? Diet - low sodium heart healthy   Complete by: As directed ?  ? Discharge instructions   Complete by: As directed ?  ? 1)Please take prescribed medications as instructed. ?2)Follow up with your PCP in a week. Do a CBC and BMP tests during the follow up  ? Increase activity slowly   Complete by: As directed ?  ? ?  ? ?  Allergies as of 06/09/2021   ? ?   Reactions  ? Haldol [haloperidol Lactate] Other (See Comments)  ? Pt states that she was unable to talk or move.   ? ?  ? ?  ?Medication List  ?  ? ?STOP taking these medications   ? ?budesonide-formoterol 80-4.5 MCG/ACT inhaler ?Commonly  known as: Symbicort ?  ?gabapentin 600 MG tablet ?Commonly known as: Neurontin ?  ? ?  ? ?TAKE these medications   ? ?albuterol 108 (90 Base) MCG/ACT inhaler ?Commonly known as: VENTOLIN HFA ?Inhale 2 puffs into the lungs every 6 (six) hours as needed for wheezing or shortness of breath. ?  ?ALPRAZolam 1 MG tablet ?Commonly known as: Duanne Moron ?Take 1 mg by mouth at bedtime. ?  ?alum & mag hydroxide-simeth 633-354-56 MG/5ML suspension ?Commonly known as: Maalox Advanced Max St ?Take 10 mLs by mouth every 6 (six) hours as needed for indigestion. ?  ?amLODipine-benazepril 10-40 MG capsule ?Commonly known as: LOTREL ?Take 1 capsule by mouth daily. ?  ?azithromycin 500 MG tablet ?Commonly known as: ZITHROMAX ?Take 1 tablet (500 mg total) by mouth once for 1 dose. ?Start taking on: June 10, 2021 ?  ?BIOTIN PO ?Take 1 capsule by mouth daily. ?  ?Breo Ellipta 100-25 MCG/ACT Aepb ?Generic drug: fluticasone furoate-vilanterol ?Inhale 1 puff into the lungs daily. ?  ?dextromethorphan-guaiFENesin 30-600 MG 12hr tablet ?Commonly known as: [place] DM ?Take 1 tablet by mouth 2 (two) times daily as needed for up to 5 days for cough. ?  ?diphenhydrAMINE 25 MG tablet ?Commonly known as: BENADRYL ?Take 50 mg by mouth every 6 (six) hours as needed for sleep. ?  ?FLUoxetine 40 MG capsule ?Commonly known as: PROZAC ?Take 40 mg by mouth daily. ?  ?MUCINEX FAST-MAX CONGEST COUGH PO ?Take 2 tablets by mouth 2 (two) times daily as needed (congestion). ?  ?omeprazole 40 MG capsule ?Commonly known as: PRILOSEC ?Take 40 mg by mouth daily. ?  ?predniSONE 20 MG tablet ?Commonly known as: DELTASONE ?Take 2 tablets (40 mg total) by mouth daily for 1 day. ?Start taking on: June 10, 2021 ?  ?pregabalin 150 MG capsule ?Commonly known as: LYRICA ?Take 150 mg by mouth daily as needed (pain). ?What changed: Another medication with the same name was removed. Continue taking this medication, and follow the directions you see here. ?  ?promethazine 25 MG  tablet ?Commonly known as: PHENERGAN ?TAKE 1 TABLET(25 MG) BY MOUTH EVERY 6 HOURS AS NEEDED FOR NAUSEA OR VOMITING ?What changed: See the new instructions. ?  ?QUEtiapine 100 MG tablet ?Commonly known as: SEROQUEL ?Take 100 mg by mouth at bedtime. ?  ?Suboxone 8-2 MG Film ?Generic drug: Buprenorphine HCl-Naloxone HCl ?Place 1 Film under the tongue 2 (two) times daily. ?  ?tiotropium 18 MCG inhalation capsule ?Commonly known as: Spiriva HandiHaler ?Place 1 capsule (18 mcg total) into inhaler and inhale every morning. ?What changed: when to take this ?  ? ?  ? ?  ?  ? ? ?  ?Durable Medical Equipment  ?(From admission, onward)  ?  ? ? ?  ? ?  Start     Ordered  ? 06/07/21 1149  For home use only DME Nebulizer machine  Once       ?Question Answer Comment  ?Patient needs a nebulizer to treat with the following condition COPD (chronic obstructive pulmonary disease) ([place])   ?Length of Need Lifetime   ?  ? 06/07/21 1149  ? ?  ?  ? ?  ? ?  Follow-up Information   ? ? Helen Hashimoto., MD. Schedule an appointment as soon as possible for a visit in 1 week(s).   ?Specialty: Internal Medicine ?Contact information: ?[address] ?[address] ?[place] [address] ?[phone] ? ? ?  ?  ? ?  ?  ? ?  ? ?Allergies  ?Allergen Reactions  ? Haldol [Haloperidol Lactate] Other (See Comments)  ?  Pt states that she was unable to talk or move.   ? ? ?Consultations: ?GI ? ? ?Procedures/Studies: ?DG Chest 2 View ? ?Result Date: 06/03/2021 ?CLINICAL DATA:  Chest pain, shortness of breath, weakness, and dizziness. EXAM: CHEST - 2 VIEW COMPARISON:  09/29/2016. FINDINGS: The heart size and mediastinal contours are within normal limits. Patchy airspace disease with areas of consolidation is present in the mid right lung. No effusion or pneumothorax. Lumbar spinal fusion hardware is noted. Degenerative changes are present in the thoracic spine. IMPRESSION: Patchy airspace disease with areas of consolidation in the mid right lung, which may  be infectious, inflammatory, or neoplastic. CT is recommended for further evaluation. Electronically Signed   By: Brett Fairy M.D.   On: 06/03/2021 20:19  ? ?DG Chest Port 1 View ? ?Result Date: 06/06/18

## 2021-06-09 NOTE — Anesthesia Procedure Notes (Signed)
Procedure Name: [place] ?Date/Time: 06/09/2021 11:45 AM ?Performed by: Amadeo Garnet, CRNA ?Pre-anesthesia Checklist: Patient identified, Emergency Drugs available, Suction available and Patient being monitored ?Oxygen Delivery Method: Nasal cannula ?Placement Confirmation: positive ETCO2 ? ? ? ? ?

## 2021-06-09 NOTE — Progress Notes (Signed)
Patient arrived back to [facility] room 28 alert and oriented pain level 4/10. bed in lowest position. Call light in reach. Will continue to monitor patient.  ?

## 2021-06-09 NOTE — Progress Notes (Signed)
Discharge instructions given to pt via swat nurse. Patient transported to discharge lounge via wheelchair to wait on ride. ?

## 2021-06-09 NOTE — Transfer of Care (Signed)
Immediate Anesthesia Transfer of Care Note ? ?Patient: Madeline Dickerson ? ?Procedure(s) Performed: ESOPHAGOGASTRODUODENOSCOPY (EGD) WITH PROPOFOL ?COLONOSCOPY WITH PROPOFOL ?POLYPECTOMY ? ?Patient Location: PACU ? ?Anesthesia Type:MAC ? ?Level of Consciousness: drowsy and patient cooperative ? ?Airway & Oxygen Therapy: Patient Spontanous Breathing and Patient connected to nasal cannula oxygen ? ?Post-op Assessment: Report given to RN, Post -op Vital signs reviewed and stable and Patient moving all extremities ? ?Post vital signs: Reviewed and stable ? ?Last Vitals:  ?Vitals Value Taken Time  ?BP    ?Temp    ?Pulse 55 06/09/21 1222  ?Resp 13 06/09/21 1222  ?SpO2 94 % 06/09/21 1222  ?Vitals shown include unvalidated device data. ? ?Last Pain:  ?Vitals:  ? 06/09/21 1057  ?TempSrc: Temporal  ?PainSc: 0-No pain  ?   ? ?  ? ?Complications: No notable events documented. ?

## 2021-06-09 NOTE — Interval H&P Note (Signed)
History and Physical Interval Note: ? ?06/09/2021 ?11:39 AM ? ?Madeline Dickerson  has presented today for surgery, with the diagnosis of Anemia.  The various methods of treatment have been discussed with the patient and family. After consideration of risks, benefits and other options for treatment, the patient has consented to  Procedure(s): ?ESOPHAGOGASTRODUODENOSCOPY (EGD) WITH PROPOFOL (N/A) ?COLONOSCOPY WITH PROPOFOL (N/A) as a surgical intervention.  The patient's history has been reviewed, patient examined, no change in status, stable for surgery.  I have reviewed the patient's chart and labs.  Questions were answered to the patient's satisfaction.   ? ? ?[name] ? ? ?

## 2021-06-10 LAB — SURGICAL PATHOLOGY

## 2021-06-11 NOTE — Anesthesia Postprocedure Evaluation (Signed)
Anesthesia Post Note ? ?Patient: Madeline Dickerson ? ?Procedure(s) Performed: ESOPHAGOGASTRODUODENOSCOPY (EGD) WITH PROPOFOL ?COLONOSCOPY WITH PROPOFOL ?POLYPECTOMY ? ?  ? ?Patient location during evaluation: PACU ?Anesthesia Type: MAC ?Level of consciousness: awake and alert ?Pain management: pain level controlled ?Vital Signs Assessment: post-procedure vital signs reviewed and stable ?Respiratory status: spontaneous breathing ?Cardiovascular status: stable ?Anesthetic complications: no ? ? ?No notable events documented. ? ?Last Vitals:  ?Vitals:  ? 06/09/21 1232 06/09/21 1254  ?BP: 110/73 (!) 155/72  ?Pulse: 69 (!) 57  ?Resp: 17 18  ?Temp: 37.2 ?C 36.8 ?C  ?SpO2: 97% 98%  ?  ?Last Pain:  ?Vitals:  ? 06/09/21 1254  ?TempSrc: Oral  ?PainSc:   ? ? ?  ?  ?  ?  ?  ?  ? ?Nolon Nations ? ? ? ? ?

## 2021-06-14 DIAGNOSIS — Z79891 Long term (current) use of opiate analgesic: Secondary | ICD-10-CM | POA: Diagnosis not present

## 2021-06-14 DIAGNOSIS — J449 Chronic obstructive pulmonary disease, unspecified: Secondary | ICD-10-CM | POA: Diagnosis not present

## 2021-06-16 DIAGNOSIS — R739 Hyperglycemia, unspecified: Secondary | ICD-10-CM | POA: Diagnosis not present

## 2021-06-16 DIAGNOSIS — J449 Chronic obstructive pulmonary disease, unspecified: Secondary | ICD-10-CM | POA: Diagnosis not present

## 2021-06-16 DIAGNOSIS — E871 Hypo-osmolality and hyponatremia: Secondary | ICD-10-CM | POA: Diagnosis not present

## 2021-06-16 DIAGNOSIS — M1990 Unspecified osteoarthritis, unspecified site: Secondary | ICD-10-CM | POA: Diagnosis not present

## 2021-06-16 DIAGNOSIS — E785 Hyperlipidemia, unspecified: Secondary | ICD-10-CM | POA: Diagnosis not present

## 2021-06-16 DIAGNOSIS — D649 Anemia, unspecified: Secondary | ICD-10-CM | POA: Diagnosis not present

## 2021-06-16 DIAGNOSIS — Z79899 Other long term (current) drug therapy: Secondary | ICD-10-CM | POA: Diagnosis not present

## 2021-07-07 DIAGNOSIS — M1990 Unspecified osteoarthritis, unspecified site: Secondary | ICD-10-CM | POA: Diagnosis not present

## 2021-07-30 DIAGNOSIS — Z79891 Long term (current) use of opiate analgesic: Secondary | ICD-10-CM | POA: Diagnosis not present

## 2021-07-30 DIAGNOSIS — J449 Chronic obstructive pulmonary disease, unspecified: Secondary | ICD-10-CM | POA: Diagnosis not present

## 2021-08-02 ENCOUNTER — Ambulatory Visit (INDEPENDENT_AMBULATORY_CARE_PROVIDER_SITE_OTHER): Payer: Medicare Other | Admitting: Orthopedic Surgery

## 2021-08-02 DIAGNOSIS — M25511 Pain in right shoulder: Secondary | ICD-10-CM | POA: Diagnosis not present

## 2021-08-02 DIAGNOSIS — G8929 Other chronic pain: Secondary | ICD-10-CM | POA: Diagnosis not present

## 2021-08-02 DIAGNOSIS — M25512 Pain in left shoulder: Secondary | ICD-10-CM

## 2021-08-05 ENCOUNTER — Telehealth: Payer: Self-pay | Admitting: Orthopedic Surgery

## 2021-08-05 NOTE — Telephone Encounter (Signed)
Patient called. She would like something called in for pain to [facility] on [address]. Her number is [phone]

## 2021-08-05 NOTE — Telephone Encounter (Signed)
Pt was in the office 08/02/2021 shoulder pain and is requesting something for pain please advise.

## 2021-08-06 ENCOUNTER — Other Ambulatory Visit: Payer: Self-pay | Admitting: Orthopedic Surgery

## 2021-08-06 MED ORDER — HYDROCODONE-ACETAMINOPHEN 5-325 MG PO TABS: 1.0000 | ORAL_TABLET | Freq: Four times a day (QID) | ORAL | 0 refills | Status: DC | PRN

## 2021-08-09 ENCOUNTER — Telehealth: Payer: Self-pay | Admitting: Orthopedic Surgery

## 2021-08-09 ENCOUNTER — Other Ambulatory Visit: Payer: Self-pay | Admitting: Orthopedic Surgery

## 2021-08-09 MED ORDER — CYCLOBENZAPRINE HCL 10 MG PO TABS: 10.0000 mg | ORAL_TABLET | Freq: Three times a day (TID) | ORAL | 0 refills | Status: DC | PRN

## 2021-08-09 NOTE — Telephone Encounter (Signed)
Called and lm on vm to advise.

## 2021-08-09 NOTE — Telephone Encounter (Signed)
Patient called. 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 Can not take hydrocodone. Would like Flexeril called in for her and hydrocodone canceled. Her call back number is [phone]

## 2021-08-10 ENCOUNTER — Encounter: Payer: Self-pay | Admitting: Orthopedic Surgery

## 2021-08-10 DIAGNOSIS — M25511 Pain in right shoulder: Secondary | ICD-10-CM

## 2021-08-10 NOTE — Progress Notes (Signed)
Office Visit Note   Patient: Madeline Dickerson           Date of Birth: 1960-07-26           MRN: [id number] Visit Date: 08/02/2021              Requested by: Helen Hashimoto., MD [address] [place],  [place] [address] PCP: Helen Hashimoto., MD  Chief Complaint  Patient presents with   Right Shoulder - Pain   Left Shoulder - Pain      HPI: Patient is a 61 year old woman with chronic impingement both shoulders.  Patient complains of pain also in her lumbar spine as well as the medial scapular border on the right.  Patient does see neurosurgery for these issues.  Assessment & Plan: Visit Diagnoses:  1. Chronic pain of both shoulders     Plan: Both shoulders were injected she tolerated this well she will follow-up with Dr. Christella Noa for her neck and back issues.  Follow-Up Instructions: Return if symptoms worsen or fail to improve.   Ortho Exam  Patient is alert, oriented, no adenopathy, well-dressed, normal affect, normal respiratory effort. Examination patient has full active range of motion of both shoulders she has pain with Neer and Hawkins impingement test pain with a drop arm test.  No focal motor weakness.  Imaging: No results found. No images are attached to the encounter.  Labs: Lab Results  Component Value Date   HGBA1C 6.0 (H) 12/26/2015   HGBA1C 6.0 04/01/2011   REPTSTATUS 06/09/2021 FINAL 06/04/2021   GRAMSTAIN  12/27/2015    MODERATE WBC PRESENT,BOTH PMN AND MONONUCLEAR RARE SQUAMOUS EPITHELIAL CELLS PRESENT FEW GRAM POSITIVE COCCI IN PAIRS RARE BUDDING YEAST SEEN    CULT  06/04/2021    NO GROWTH 5 DAYS Performed at [facility], [place] [address], [place], [place] [address]    LABORGA ESCHERICHIA COLI 04/30/2015     Lab Results  Component Value Date   ALBUMIN 1.7 (L) 06/06/2021   ALBUMIN 1.9 (L) 06/04/2021   ALBUMIN 4.1 10/09/2019    Lab Results  Component Value Date   MG 1.7 06/06/2021   MG 1.7 06/05/2021   MG  1.7 06/04/2021   No results found for: "VD25OH"  No results found for: "PREALBUMIN"    Latest Ref Rng & Units 06/09/2021    1:34 AM 06/08/2021   12:40 AM 06/07/2021   12:57 AM  CBC EXTENDED  WBC 4.0 - 10.5 K/uL 14.9  15.0  16.4   RBC 3.87 - 5.11 MIL/uL 3.22  2.69  2.69   Hemoglobin 12.0 - 15.0 g/dL 9.4  8.0  7.8   HCT 36.0 - 46.0 % 30.0  24.9  25.4   Platelets 150 - 400 K/uL 822  681  665      There is no height or weight on file to calculate BMI.  Orders:  No orders of the defined types were placed in this encounter.  No orders of the defined types were placed in this encounter.    Procedures: Large Joint Inj: bilateral subacromial bursa on 08/10/2021 11:42 AM Indications: diagnostic evaluation and pain Details: 22 G 1.5 in needle, posterior approach  Arthrogram: No  Outcome: tolerated well, no immediate complications Procedure, treatment alternatives, risks and benefits explained, specific risks discussed. Consent was given by the patient. Immediately prior to procedure a time out was called to verify the correct patient, procedure, equipment, support staff and site/side marked as  required. Patient was prepped and draped in the usual sterile fashion.      Clinical Data: No additional findings.  ROS:  All other systems negative, except as noted in the HPI. Review of Systems  Objective: Vital Signs: LMP 08/28/2012   Specialty Comments:  No specialty comments available.  PMFS History: Patient Active Problem List   Diagnosis Date Noted   Severe Sepsis with Septic Shock due to Rt Sided PNA  06/05/2021   Nontraumatic complete tear of right rotator cuff    Impingement syndrome of right shoulder 12/01/2016   Impingement syndrome of left shoulder 12/01/2016   HNP (herniated nucleus pulposus), lumbar 05/05/2016   Hyperkalemia 12/28/2015   COPD exacerbation ([place]) 12/25/2015   Anemia 12/25/2015   Shortness of breath 12/25/2015   Cardiomegaly 12/25/2015   Bipolar 1  disorder, mixed, moderate ([place]) 06/03/2015   CAP (community acquired pneumonia) 04/30/2015   Pneumonitis 04/30/2015   Status asthmaticus 04/30/2015   Acute respiratory failure with hypoxia ([place]) 04/30/2015   Dyspnea 04/30/2015   Hyponatremia, severe 04/30/2015   Lumbar spondylosis 04/27/2011   Screening for colon cancer 01/17/2011   Abnormal EKG 01/17/2011   Tobacco abuse 01/17/2011   Tobacco abuse counseling 01/17/2011   Hypertension    Chronic low back pain    Past Medical History:  Diagnosis Date   Anxiety    hx   Arthritis    "lower back, hands" (05/01/2015)   Chronic low back pain    disk s/p 4 diskectomies, 5th fursion, then spinal cord stimulator ([place])   Depression    Dysrhythmia    irruglar heart beat "nothing wrong"    Fibromyalgia    GERD (gastroesophageal reflux disease)    Hypertension    Pneumonia 05/01/2015   PONV (postoperative nausea and vomiting)    Rotator cuff disorder    Seasonal asthma    "seasonal"   Shortness of breath dyspnea    Walking pneumonia     Family History  Problem Relation Age of Onset   Stroke Father    Hypertension Father    Lung cancer Father     Past Surgical History:  Procedure Laterality Date   BACK SURGERY     CESAREAN SECTION  1986   COLONOSCOPY WITH PROPOFOL N/A 06/09/2021   Procedure: COLONOSCOPY WITH PROPOFOL;  Surgeon: Carol Ada, MD;  Location: [facility];  Service: Gastroenterology;  Laterality: N/A;   ESOPHAGOGASTRODUODENOSCOPY (EGD) WITH PROPOFOL N/A 06/09/2021   Procedure: ESOPHAGOGASTRODUODENOSCOPY (EGD) WITH PROPOFOL;  Surgeon: Carol Ada, MD;  Location: [facility];  Service: Gastroenterology;  Laterality: N/A;   LUMBAR MICRODISCECTOMY  01/1990; 2003   LUMBAR SPINE SURGERY  03/1991   "cleaned up scar tissue"   POLYPECTOMY  06/09/2021   Procedure: POLYPECTOMY;  Surgeon: Carol Ada, MD;  Location: [facility];  Service: Gastroenterology;;   POSTERIOR LUMBAR FUSION  2011; 04/27/2011   REPAIR DURAL /  CSF LEAK  02/1990   SHOULDER ARTHROSCOPY Right 12/10/2019   Procedure: RIGHT SHOULDER ARTHROSCOPY AND DEBRIDEMENT;  Surgeon: Newt Minion, MD;  Location: [facility];  Service: Orthopedics;  Laterality: Right;   SPINAL CORD STIMULATOR IMPLANT  2012   SPINAL CORD STIMULATOR REMOVAL  08/2010   TUBAL LIGATION  1986   Social History   Occupational History   Not on file  Tobacco Use   Smoking status: Every Day    Packs/day: 1.00    Years: 42.00    Total pack years: 42.00    Types: Cigarettes   Smokeless  tobacco: Never   Tobacco comments:    prior trial of zyban made her feel weird, pt doesnt want info right now  Substance and Sexual Activity   Alcohol use: No   Drug use: Yes    Types: Marijuana    Comment: 05/01/2015 "I've used lots of multiple street drugs; nothing in the past couple years", "i use weed once a month"   Sexual activity: Yes

## 2021-09-09 DIAGNOSIS — Z1231 Encounter for screening mammogram for malignant neoplasm of breast: Secondary | ICD-10-CM | POA: Diagnosis not present

## 2021-09-16 ENCOUNTER — Other Ambulatory Visit: Payer: Self-pay | Admitting: Orthopedic Surgery

## 2021-09-20 ENCOUNTER — Other Ambulatory Visit: Payer: Self-pay

## 2021-09-20 ENCOUNTER — Emergency Department (HOSPITAL_COMMUNITY): Payer: Medicare Other

## 2021-09-20 ENCOUNTER — Encounter (HOSPITAL_COMMUNITY): Payer: Self-pay | Admitting: Emergency Medicine

## 2021-09-20 ENCOUNTER — Emergency Department (HOSPITAL_COMMUNITY)
Admission: EM | Admit: 2021-09-20 | Discharge: 2021-09-23 | Disposition: A | Discharge status: Other Acute Inpatient Hospital | Payer: Medicare Other | Attending: Emergency Medicine | Admitting: Emergency Medicine

## 2021-09-20 DIAGNOSIS — R410 Disorientation, unspecified: Secondary | ICD-10-CM | POA: Insufficient documentation

## 2021-09-20 DIAGNOSIS — J449 Chronic obstructive pulmonary disease, unspecified: Secondary | ICD-10-CM | POA: Insufficient documentation

## 2021-09-20 DIAGNOSIS — Z79899 Other long term (current) drug therapy: Secondary | ICD-10-CM | POA: Insufficient documentation

## 2021-09-20 DIAGNOSIS — Z7951 Long term (current) use of inhaled steroids: Secondary | ICD-10-CM | POA: Diagnosis not present

## 2021-09-20 DIAGNOSIS — F29 Unspecified psychosis not due to a substance or known physiological condition: Secondary | ICD-10-CM | POA: Diagnosis not present

## 2021-09-20 DIAGNOSIS — R609 Edema, unspecified: Secondary | ICD-10-CM | POA: Diagnosis not present

## 2021-09-20 DIAGNOSIS — R000 Tachycardia, unspecified: Secondary | ICD-10-CM | POA: Insufficient documentation

## 2021-09-20 DIAGNOSIS — T63484A Toxic effect of venom of other arthropod, undetermined, initial encounter: Secondary | ICD-10-CM | POA: Diagnosis not present

## 2021-09-20 DIAGNOSIS — R4182 Altered mental status, unspecified: Secondary | ICD-10-CM | POA: Insufficient documentation

## 2021-09-20 LAB — COMPREHENSIVE METABOLIC PANEL
ALT: 17 U/L (ref 0–44)
AST: 18 U/L (ref 15–41)
Albumin: 4.2 g/dL (ref 3.5–5.0)
Alkaline Phosphatase: 86 U/L (ref 38–126)
Anion gap: 12 (ref 5–15)
BUN: 20 mg/dL (ref 8–23)
CO2: 18 mmol/L — ABNORMAL LOW (ref 22–32)
Calcium: 9.6 mg/dL (ref 8.9–10.3)
Chloride: 103 mmol/L (ref 98–111)
Creatinine, Ser: 0.89 mg/dL (ref 0.44–1.00)
GFR, Estimated: >60 mL/min (ref >60)
Glucose, Bld: 133 mg/dL — ABNORMAL HIGH (ref 70–99)
Potassium: 4.1 mmol/L (ref 3.5–5.1)
Sodium: 133 mmol/L — ABNORMAL LOW (ref 135–145)
Total Bilirubin: 0.4 mg/dL (ref 0.3–1.2)
Total Protein: 8.5 g/dL — ABNORMAL HIGH (ref 6.5–8.1)

## 2021-09-20 LAB — URINALYSIS, ROUTINE W REFLEX MICROSCOPIC
Bilirubin Urine: NEGATIVE
Glucose, UA: NEGATIVE mg/dL
Hgb urine dipstick: NEGATIVE
Ketones, ur: NEGATIVE mg/dL
Leukocytes,Ua: NEGATIVE
Nitrite: NEGATIVE
Protein, ur: NEGATIVE mg/dL
Specific Gravity, Urine: 1.002 — ABNORMAL LOW (ref 1.005–1.030)
pH: 5 (ref 5.0–8.0)

## 2021-09-20 LAB — CBC WITH DIFFERENTIAL/PLATELET
Abs Immature Granulocytes: 0.07 10*3/uL (ref 0.00–0.07)
Basophils Absolute: 0.1 10*3/uL (ref 0.0–0.1)
Basophils Relative: 1 %
Eosinophils Absolute: 0.2 10*3/uL (ref 0.0–0.5)
Eosinophils Relative: 1 %
HCT: 38.1 % (ref 36.0–46.0)
Hemoglobin: 12.4 g/dL (ref 12.0–15.0)
Immature Granulocytes: 0 %
Lymphocytes Relative: 23 %
Lymphs Abs: 4 10*3/uL (ref 0.7–4.0)
MCH: 29.7 pg (ref 26.0–34.0)
MCHC: 32.5 g/dL (ref 30.0–36.0)
MCV: 91.4 fL (ref 80.0–100.0)
Monocytes Absolute: 1.3 10*3/uL — ABNORMAL HIGH (ref 0.1–1.0)
Monocytes Relative: 7 %
Neutro Abs: 11.6 10*3/uL — ABNORMAL HIGH (ref 1.7–7.7)
Neutrophils Relative %: 68 %
Platelets: 448 10*3/uL — ABNORMAL HIGH (ref 150–400)
RBC: 4.17 MIL/uL (ref 3.87–5.11)
RDW: 14.2 % (ref 11.5–15.5)
WBC: 17.1 10*3/uL — ABNORMAL HIGH (ref 4.0–10.5)
nRBC: 0 % (ref 0.0–0.2)

## 2021-09-20 LAB — AMMONIA: Ammonia: 32 umol/L (ref 9–35)

## 2021-09-20 LAB — RAPID URINE DRUG SCREEN, HOSP PERFORMED
Amphetamines: NOT DETECTED
Barbiturates: NOT DETECTED
Benzodiazepines: POSITIVE — AB
Cocaine: NOT DETECTED
Opiates: NOT DETECTED
Tetrahydrocannabinol: POSITIVE — AB

## 2021-09-20 LAB — ETHANOL: Alcohol, Ethyl (B): <10 mg/dL (ref <10)

## 2021-09-20 LAB — CBG MONITORING, ED: Glucose-Capillary: 155 mg/dL — ABNORMAL HIGH (ref 70–99)

## 2021-09-20 MED ORDER — BUPRENORPHINE HCL-NALOXONE HCL 8-2 MG SL SUBL
1.0000 | SUBLINGUAL_TABLET | Freq: Every day | SUBLINGUAL | Status: DC
  Administered 2021-09-20 – 2021-09-23 (×4): 1 via SUBLINGUAL
  Filled 2021-09-20 (×4): qty 1

## 2021-09-20 MED ORDER — UMECLIDINIUM BROMIDE 62.5 MCG/ACT IN AEPB
1.0000 | INHALATION_SPRAY | Freq: Every morning | RESPIRATORY_TRACT | Status: DC
  Administered 2021-09-21 – 2021-09-22 (×2): 1 via RESPIRATORY_TRACT
  Filled 2021-09-20: qty 7

## 2021-09-20 MED ORDER — AMLODIPINE BESYLATE 5 MG PO TABS
10.0000 mg | ORAL_TABLET | Freq: Every day | ORAL | Status: DC
  Administered 2021-09-20 – 2021-09-23 (×4): 10 mg via ORAL
  Filled 2021-09-20 (×4): qty 2

## 2021-09-20 MED ORDER — STERILE WATER FOR INJECTION IJ SOLN
INTRAMUSCULAR | Status: AC
  Filled 2021-09-20: qty 10

## 2021-09-20 MED ORDER — BUPRENORPHINE HCL-NALOXONE HCL 8-2 MG SL SUBL: 1.0000 | SUBLINGUAL_TABLET | Freq: Every day | SUBLINGUAL | Status: DC

## 2021-09-20 MED ORDER — BENAZEPRIL HCL 20 MG PO TABS
40.0000 mg | ORAL_TABLET | Freq: Every day | ORAL | Status: DC
  Administered 2021-09-21 – 2021-09-23 (×3): 40 mg via ORAL
  Filled 2021-09-20 (×2): qty 2
  Filled 2021-09-20: qty 1
  Filled 2021-09-20 (×2): qty 2

## 2021-09-20 MED ORDER — QUETIAPINE FUMARATE 100 MG PO TABS
100.0000 mg | ORAL_TABLET | Freq: Every day | ORAL | Status: DC
  Administered 2021-09-20: 100 mg via ORAL
  Filled 2021-09-20: qty 1

## 2021-09-20 MED ORDER — ZIPRASIDONE HCL 20 MG PO CAPS
20.0000 mg | ORAL_CAPSULE | Freq: Once | ORAL | Status: AC
  Administered 2021-09-20: 20 mg via ORAL
  Filled 2021-09-20: qty 1

## 2021-09-20 MED ORDER — ALPRAZOLAM 0.25 MG PO TABS
0.5000 mg | ORAL_TABLET | Freq: Every day | ORAL | Status: DC | PRN
  Administered 2021-09-20 – 2021-09-22 (×3): 1 mg via ORAL
  Filled 2021-09-20 (×3): qty 4

## 2021-09-20 MED ORDER — ALPRAZOLAM 0.25 MG PO TABS
1.0000 mg | ORAL_TABLET | Freq: Once | ORAL | Status: AC
  Administered 2021-09-20: 1 mg via ORAL
  Filled 2021-09-20: qty 4

## 2021-09-20 MED ORDER — ZIPRASIDONE MESYLATE 20 MG IM SOLR
20.0000 mg | Freq: Once | INTRAMUSCULAR | Status: AC
  Administered 2021-09-20: 20 mg via INTRAMUSCULAR
  Filled 2021-09-20: qty 20

## 2021-09-20 MED ORDER — FLUTICASONE FUROATE-VILANTEROL 100-25 MCG/ACT IN AEPB
1.0000 | INHALATION_SPRAY | Freq: Every day | RESPIRATORY_TRACT | Status: DC
  Administered 2021-09-21 – 2021-09-22 (×2): 1 via RESPIRATORY_TRACT
  Filled 2021-09-20: qty 28

## 2021-09-20 NOTE — BH Assessment (Signed)
TTS clinician attempted to complete TTS assessment. Per Haywood Lasso, RN, patient received Geodon, unable to participate. TTS will attempt at later time.

## 2021-09-20 NOTE — ED Provider Notes (Signed)
[facility] EMERGENCY DEPARTMENT Provider Note   CSN: [id number] Arrival date & time: 09/20/21  1046     History Chief Complaint  Patient presents with   Altered Mental Status     Altered Mental Status 61 year old female past medical history notable for COPD, bipolar 1 disorder, pneumonia c/b septic shock (05/2021) who is presenting with altered mental status.  Initially brought in by EMS, who reported that they were called by the patient's remainder stated the patient was acting "more confused than usual".  At baseline, patient reportedly has memory issues, but remains says that this is different, as the patient is making "nonsensical statements".  On review of systems, patient answers "yes, it all started 13.5 years ago" to all questions.  Regardless of questioning, patient intermittently appears to answer in a logical manner at times, followed by intermittent verbal outbursts of random words including "zero"," rotate", and "Shannon".  Patient's daughter arrived at bedside.  She is able to corroborate more history.  She reports that the patient will occasionally tell circular stories, but that she usually is able to be redirected.  She talked to her on Saturday, and felt that she was at her baseline.  However, she was called by her mom's roommate earlier today, who was concerned that the patient was not her normal self.  Upon arrival, patient's daughter says that this is different than the patient's baseline, and that she has "never seen her like this".  She reports that the patient stopped taking her Seroquel a few weeks ago, and that she was complaining on Saturday of running out of her home Suboxone.  She denies her mom complaining of any chest pain, difficulty breathing, pain, or generally feeling unwell.   Home Medications Prior to Admission medications   Medication Sig Start Date End Date Taking? Authorizing Provider  albuterol (PROVENTIL HFA;VENTOLIN HFA) 108 (90  Base) MCG/ACT inhaler Inhale 2 puffs into the lungs every 6 (six) hours as needed for wheezing or shortness of breath. 12/28/15   Dhungel, Flonnie Overman, MD  ALPRAZolam Duanne Moron) 1 MG tablet Take 1 mg by mouth at bedtime. 05/13/21   [provider]  alum & mag hydroxide-simeth (MAALOX ADVANCED MAX ST) [phone] MG/5ML suspension Take 10 mLs by mouth every 6 (six) hours as needed for indigestion. 11/29/16   Forde Dandy, MD  amLODipine-benazepril (LOTREL) 10-40 MG capsule Take 1 capsule by mouth daily. 04/19/21   [provider]  BIOTIN PO Take 1 capsule by mouth daily.    [provider]  BREO ELLIPTA 100-25 MCG/ACT AEPB Inhale 1 puff into the lungs daily. 04/29/21   [provider]  cyclobenzaprine (FLEXERIL) 10 MG tablet TAKE 1 TABLET(10 MG) BY MOUTH THREE TIMES DAILY AS NEEDED FOR MUSCLE SPASMS 09/17/21   Suzan Slick, NP  diphenhydrAMINE (BENADRYL) 25 MG tablet Take 50 mg by mouth every 6 (six) hours as needed for sleep.    [provider]  ferrous sulfate 325 (65 FE) MG tablet Take 1 tablet (325 mg total) by mouth daily. 06/09/21 06/09/22  Shelly Coss, MD  FLUoxetine (PROZAC) 40 MG capsule Take 40 mg by mouth daily. 03/05/21   [provider]  HYDROcodone-acetaminophen (NORCO/VICODIN) 5-325 MG tablet Take 1 tablet by mouth every 6 (six) hours as needed for moderate pain. 08/06/21   Newt Minion, MD  omeprazole (PRILOSEC) 40 MG capsule Take 40 mg by mouth daily. 04/18/21   [provider]  pantoprazole (PROTONIX) 40 MG tablet Take 1 tablet (  40 mg total) by mouth daily. 06/09/21 06/09/22  Shelly Coss, MD  Phenylephrine-DM-GG (MUCINEX FAST-MAX CONGEST COUGH PO) Take 2 tablets by mouth 2 (two) times daily as needed (congestion).    [provider]  pregabalin (LYRICA) 150 MG capsule Take 150 mg by mouth daily as needed (pain). 03/27/21   [provider]  promethazine (PHENERGAN) 25 MG tablet TAKE 1 TABLET(25 MG) BY MOUTH EVERY 6  HOURS AS NEEDED FOR NAUSEA OR VOMITING Patient taking differently: Take 25 mg by mouth every 6 (six) hours as needed for vomiting or nausea. 10/14/19   Persons, Bevely Palmer, PA  QUEtiapine (SEROQUEL) 100 MG tablet Take 100 mg by mouth at bedtime. 05/13/21   [provider]  SUBOXONE 8-2 MG FILM Place 1 Film under the tongue 2 (two) times daily.    [provider]  tiotropium (SPIRIVA HANDIHALER) 18 MCG inhalation capsule Place 1 capsule (18 mcg total) into inhaler and inhale every morning. Patient taking differently: Place 18 mcg into inhaler and inhale daily. 12/28/15   Dhungel, Flonnie Overman, MD      Allergies    Haldol [haloperidol lactate]    Review of Systems   Review of Systems Unable to obtain given altered mental status.  Physical Exam Updated Vital Signs BP (!) 145/69 (BP Location: Left Arm)   Pulse (!) 110   Temp 98.4 F (36.9 C) (Oral)   Resp 17   LMP 08/28/2012   SpO2 99%   Physical Exam Vitals and nursing note reviewed.  Constitutional:      General: She is not in acute distress.    Appearance: Normal appearance. She is well-developed. She is not ill-appearing or diaphoretic.  HENT:     Head: Normocephalic and atraumatic.     Right Ear: External ear normal.     Left Ear: External ear normal.     Nose: Nose normal.     Mouth/Throat:     Mouth: Mucous membranes are moist.  Cardiovascular:     Rate and Rhythm: Regular rhythm. Tachycardia present.     Heart sounds: No murmur heard. Pulmonary:     Effort: Pulmonary effort is normal. No respiratory distress.     Breath sounds: Normal breath sounds.  Abdominal:     General: There is no distension.     Palpations: Abdomen is soft.     Tenderness: There is no abdominal tenderness. There is no guarding.  Musculoskeletal:     Cervical back: Neck supple.  Skin:    General: Skin is warm and dry.  Neurological:     Mental Status: She is alert. She is disoriented.     Comments: Moves all extremities. Able to  ambulate without difficulty.  Psychiatric:     Comments: Patient intermittently uncooperative with exam. Answers questions but with nonsensical answers. Tangential thinking.     ED Results / Procedures / Treatments   Labs (all labs ordered are listed, but only abnormal results are displayed) Labs Reviewed  CBC WITH DIFFERENTIAL/PLATELET - Abnormal; Notable for the following components:      Result Value   WBC 17.1 (*)    Platelets 448 (*)    Neutro Abs 11.6 (*)    Monocytes Absolute 1.3 (*)    All other components within normal limits  COMPREHENSIVE METABOLIC PANEL  URINALYSIS, ROUTINE W REFLEX MICROSCOPIC  AMMONIA  CBG MONITORING, ED  CBG MONITORING, ED    EKG EKG Interpretation  Date/Time:  Monday September 20 2021 10:53:58 EDT Ventricular Rate:  113  PR Interval:  154 QRS Duration: 80 QT Interval:  336 QTC Calculation: 460 R Axis:   -13 Text Interpretation: Sinus tachycardia with occasional Premature ventricular complexes Otherwise normal ECG When compared with ECG of 03-Jun-2021 19:50, PREVIOUS ECG IS PRESENT No significant change since last tracing Confirmed by Blanchie Dessert [phone]) on 09/20/2021 11:30:34 AM  Radiology No results found.  Procedures Procedures   Medications Ordered in ED Medications - No data to display  ED Course/ Medical Decision Making/ A&P                           Medical Decision Making Amount and/or Complexity of Data Reviewed Labs: ordered.  Risk Prescription drug management.   61 year old patient past medical history notable for COPD, bipolar 1 disorder, pneumonia c/b septic shock (05/2021) who is presenting with altered mental status.  Vitals at presentation demonstrate afebrile patient, who is hemodynamically stable, though tachycardic, satting well on room air. Physical exam notable for patient with intermittent verbal outbursts, nonsensical answers, coupled with periods of what appeared to be lucency.  Patient is alert and  oriented to self and place.  Given presentation, I am most concerned for encephalopathy secondary to infection versus metabolic causes versus psychosis and have ordered labs and imaging.   Labs resulted notable for POC glucose normal.  CBC with leukocytosis 17.1, though per chart review, patient often has leukocytosis for the last several years on CBC.  No evidence of anemia.  CMP shows bicarb mildly low at 18, though no anion gap.  No evidence of AKI or elevated LFTs.  Ammonia within normal limits.  Ethanol pending.  UA, UDS pending at time of sign out.  Patient provided home Suboxone, as daughter is concerned that this may be contributing to her presentation today, and says that her mother occasionally has " weird reactions" to missing this medication.  Patient continues to have verbal outbursts, and was provided 1 mg p.o. Ativan, which appears to be a home med per chart review.  CT head demonstrates no acute intracranial abnormality per radiology preliminary read.  I anticipate patient will require psychiatric consult following medical clearance to further evaluate for causes of her acute change in mental status.  This consult is pending at the time of signout.  Patient seen in conjunction with my attending, Dr. Maryan Rued, who supervised and agreed with this plan of care.  Final Clinical Impression(s) / ED Diagnoses Final diagnoses:  Altered mental status, unspecified altered mental status type    Rx / DC Orders ED Discharge Orders     None         Faylene Million, MD 09/20/21 3810    Blanchie Dessert, MD 09/21/21 [phone]

## 2021-09-20 NOTE — ED Notes (Signed)
Pt w/ disorganized, repetitive, word salad speech. Frequently repeats the word rotate. Is complaining of nausea and mentions suboxone. Pt hallucinating a person named [place] and a person named Conservation officer, historic buildings. She told this RN to not have sex tonight. She looked at the EDT and said Curse. Pt appears to have cellulitis to bilateral lower ankles. When asked about it she said she got bit by a brown recluse spider. Difficult to get pt to follow commands intermittently. Pt intermittently making statements that sound appropriate. Pt is now saying "I'm going to die today", when asked why, she said because she stays so hot and sick.

## 2021-09-20 NOTE — ED Notes (Signed)
Pt daughter at bedside and provides the following information: she spoke to pt Sat and her mother reported she has been out of her suboxone for about 1 week. She reports pt's baseline confusion is her forgetting if she took her meds and other normal things that might have been forgotten like where her keys are. Also reports pt will jump around in conversations where she tells a story and then jumps to give the back story and then jumps back to the original story and then forgets train of thought. Pt's daughter observed her mother's behavior and reports that pt has never acted like this before. Pt's roommate reports pt was not acting right this morning. Pt's daughter said pt always says she feels like she has a fever and has flu-like s/s.

## 2021-09-20 NOTE — ED Notes (Signed)
Ambulated pt to the bathroom and back to her room. Placed pt back in restraints.

## 2021-09-20 NOTE — ED Notes (Signed)
Pt w/ multiple attempts to get out of bed by herself and removing equipment. Pt is very difficult to redirect. Pt is unable to verbalize understanding of education regarding safety risk with getting up by herself and removing equipment

## 2021-09-20 NOTE — ED Notes (Signed)
Pt transported to CT at this time.

## 2021-09-20 NOTE — ED Provider Triage Note (Signed)
Emergency Medicine Provider Triage Evaluation Note  Madeline Dickerson , a 61 y.o. female  was evaluated in triage.  Pt complains of altered mental status.  EMS transported patient to the hospital after being contact with patient's roommate who stated patient was acting more confused than usual.  Patient has some baseline memory issues but patient's remained states this is different.  Patient is making nonsensical statements.  EMS found patient to be alert to person only.  At this time the patient is alert to person and place but is confused as to when it is or why she is here.  Unclear as to any medical complaints.  Unclear as to last known normal for patient.  Patient with no slurred speech, equal grip strength, no facial droop.  Patient readily makes statements such as "righty tighty, lefty loosey", "10-4", "my grandchild is dying".  Patient states she knows what her problem is and that it is when she cusses 0 happens  Review of Systems  Positive: Altered mental status Negative: Chest pain, shortness of breath  Physical Exam  BP (!) 145/69 (BP Location: Left Arm)   Pulse (!) 110   Temp 98.4 F (36.9 C) (Oral)   Resp 17   LMP 08/28/2012   SpO2 99%  Gen:   Awake, patient confused Resp:  Normal effort  MSK:   Moves extremities without difficulty  Other:    Medical Decision Making  Medically screening exam initiated at 11:00 AM.  Appropriate orders placed.  Madeline Dickerson was informed that the remainder of the evaluation will be completed by another provider, this initial triage assessment does not replace that evaluation, and the importance of remaining in the ED until their evaluation is complete.     Dorothyann Peng, PA-C 09/20/21 1104

## 2021-09-20 NOTE — ED Notes (Signed)
Got patient on the monitor got patient some warm blankets patient is resting with call bell in reach  ?

## 2021-09-20 NOTE — ED Notes (Signed)
Pt becoming more agitated, attempting to get out of bed. Pt yelling saying Systems developer, Evon Slack Haw"

## 2021-09-20 NOTE — ED Notes (Signed)
Pt's daughter took home pt's necklace, 2 bracelets and rings.

## 2021-09-20 NOTE — ED Triage Notes (Addendum)
Pt arrives via EMS from home. Pt's roommate noticed she is more confused than normal. (Has some confusion at baseline). Alert to self only. No recent falls. EMS reports pt replies with sentences that don't make sense. BP 160/98, 98% room air, HR 112

## 2021-09-20 NOTE — ED Notes (Signed)
Pt attempted to provide urine sample, but urinated in the toilet and not the cup like she said she could

## 2021-09-20 NOTE — ED Notes (Signed)
TSS has been contacted.

## 2021-09-21 DIAGNOSIS — R4182 Altered mental status, unspecified: Secondary | ICD-10-CM | POA: Diagnosis not present

## 2021-09-21 MED ORDER — ZIPRASIDONE MESYLATE 20 MG IM SOLR
10.0000 mg | Freq: Once | INTRAMUSCULAR | Status: AC
  Administered 2021-09-21: 10 mg via INTRAMUSCULAR
  Filled 2021-09-21: qty 20

## 2021-09-21 MED ORDER — OLANZAPINE 5 MG PO TBDP
5.0000 mg | ORAL_TABLET | Freq: Once | ORAL | Status: AC
  Administered 2021-09-21: 5 mg via ORAL
  Filled 2021-09-21: qty 1

## 2021-09-21 MED ORDER — QUETIAPINE FUMARATE 200 MG PO TABS
200.0000 mg | ORAL_TABLET | Freq: Every day | ORAL | Status: DC
  Administered 2021-09-21 – 2021-09-22 (×2): 200 mg via ORAL
  Filled 2021-09-21 (×2): qty 1

## 2021-09-21 MED ORDER — STERILE WATER FOR INJECTION IJ SOLN
INTRAMUSCULAR | Status: AC
  Administered 2021-09-21: 1.2 mL
  Filled 2021-09-21: qty 10

## 2021-09-21 NOTE — Progress Notes (Signed)
Chaplain was requested by patient. This chaplain came and conversed with her. The patient train of thought changed every few seconds and so the conversation was not linear in any fashion.  She would interrupt herself with new thoughts constantly and go onto another topic, much of which was not true. For example, she talked about loving Alfonse Alpers, but then she said she "read all of her books."  She talked about her cat, but said it was 62 years old. She is a fan of Garald Braver and seemed disappointed that this chaplain said she was Ep[employer]alian, when she inquired.  "We all figure it out differently" she says with a shrug. She also declared that this chaplain was pregnant.  She was certain. "Go pee now!"  She says she lives with her partner.  Both her parents died. She described finding her father dead. She named some past traumas and drug abuse. She often referred to giving herself her own breast exam and shouted "Beelzabub" which was contusing, until she later said this was her safe word.  She dismissed this chaplain by saying "I am ready for breakfast now!"  Chaplain conferred with nurse caring for her.    Rev. Tamsen Snider Pager [phone]

## 2021-09-21 NOTE — ED Notes (Signed)
Pt walked out back door of unit. Security called to intervene.

## 2021-09-21 NOTE — Consult Note (Signed)
[facility] ED ASSESSMENT   Reason for Consult:  psychosis Referring Physician:  Dr. Vanita Panda Patient Identification: Madeline Dickerson MRN:  [id number] ED Chief Complaint: Altered mental status, unspecified  Diagnosis:  Principal Problem:   Altered mental status, unspecified   ED Assessment Time Calculation: Start Time: 0800 Stop Time: 0830 Total Time in Minutes (Assessment Completion): 30   Subjective:   Madeline Dickerson is a 61 y.o. female patient who was brought in by EMS after the roommate called stating she was more confused than usual.  At baseline patient reportedly has memory issues per but roommate states that the patient is making nonsensical statements.  HPI:    Yesterday upon arrival she was barely able to participate in assessment, she would answer in a logical manner at times but would have verbal outbursts of random words.  She was disorganized and disoriented.  Her daughter was present and states that she was not her baseline.  Daughter reports she stopped taking her Seroquel a few weeks ago and ran out of her Suboxone a couple days ago.  Patient seen at [facility] ED this morning for face-to-face evaluation.  She is sitting up in her bed and eating breakfast.  She continues to have pressured and rapid speech.  However she was able to engage in a mostly coherent conversation, but at times became tangential. She was easily redirected back to topic.  She is labile, at one moment seems happy and complimenting provider, the next tried to usher the provider away stating "don't bring any bad news over here." She tells me she is in the hospital because she fell.  She tells me she lives with a roommate.  She is oriented x4.  Tells me her full name, date of birth, address, year, month, president.  She does mention random words at times such as "Queen" and "Benjamine Mola."  She did endorse not taking her Seroquel for at least 2-3 weeks.  Patient stated "I need to get my Seroquel I need to get my Seroquel"  repeatedly a few times.  She denies any current suicidal or homicidal ideations.  She does endorse auditory hallucinations of voices telling her about Mallory Shirk.  She stated she has not been sleeping well since not taking her Seroquel.  Denies any feelings of paranoia.  Denies any substance use or alcohol use.  After chart review patient does seem to be more oriented and improved this morning. Seroquel was restarted last night, will increase to 200 mg.  Attempted to reach her daughter, Madeline Dickerson, at [phone].  Patient daughter did not answer.  Will recommend overnight observation with reevaluation by psychiatry tomorrow.  Past Psychiatric History:  Reported history of anxiety, depression, insomnia.  She reports having trouble with her memory.  Patient does report history of substance abuse.  Risk to Self or Others: Is the patient at risk to self? No Has the patient been a risk to self in the past 6 months? No Has the patient been a risk to self within the distant past? No Is the patient a risk to others? No Has the patient been a risk to others in the past 6 months? No Has the patient been a risk to others within the distant past? No  [place] Scale:  [place] ED from 09/20/2021 in [place] ED to Hosp-Admission (Discharged) from 06/03/2021 in [place] CATEGORY No Risk No Risk  Past Medical History:  Past Medical History:  Diagnosis Date   Anxiety    hx   Arthritis    "lower back, hands" (05/01/2015)   Chronic low back pain    disk s/p 4 diskectomies, 5th fursion, then spinal cord stimulator ([place])   Depression    Dysrhythmia    irruglar heart beat "nothing wrong"    Fibromyalgia    GERD (gastroesophageal reflux disease)    Hypertension    Pneumonia 05/01/2015   PONV (postoperative nausea and vomiting)    Rotator cuff disorder    Seasonal asthma    "seasonal"    Shortness of breath dyspnea    Walking pneumonia     Past Surgical History:  Procedure Laterality Date   BACK SURGERY     CESAREAN SECTION  1986   COLONOSCOPY WITH PROPOFOL N/A 06/09/2021   Procedure: COLONOSCOPY WITH PROPOFOL;  Surgeon: Carol Ada, MD;  Location: [facility];  Service: Gastroenterology;  Laterality: N/A;   ESOPHAGOGASTRODUODENOSCOPY (EGD) WITH PROPOFOL N/A 06/09/2021   Procedure: ESOPHAGOGASTRODUODENOSCOPY (EGD) WITH PROPOFOL;  Surgeon: Carol Ada, MD;  Location: [facility];  Service: Gastroenterology;  Laterality: N/A;   LUMBAR MICRODISCECTOMY  01/1990; 2003   LUMBAR SPINE SURGERY  03/1991   "cleaned up scar tissue"   POLYPECTOMY  06/09/2021   Procedure: POLYPECTOMY;  Surgeon: Carol Ada, MD;  Location: [place];  Service: Gastroenterology;;   POSTERIOR LUMBAR FUSION  2011; 04/27/2011   REPAIR DURAL / CSF LEAK  02/1990   SHOULDER ARTHROSCOPY Right 12/10/2019   Procedure: RIGHT SHOULDER ARTHROSCOPY AND DEBRIDEMENT;  Surgeon: Newt Minion, MD;  Location: [place];  Service: Orthopedics;  Laterality: Right;   SPINAL CORD STIMULATOR IMPLANT  2012   SPINAL CORD STIMULATOR REMOVAL  08/2010   TUBAL LIGATION  1986   Family History:  Family History  Problem Relation Age of Onset   Stroke Father    Hypertension Father    Lung cancer Father     Social History:  Social History   Substance and Sexual Activity  Alcohol Use No     Social History   Substance and Sexual Activity  Drug Use Yes   Types: Marijuana   Comment: 05/01/2015 "I've used lots of multiple street drugs; nothing in the past couple years", "i use weed once a month"    Social History   Socioeconomic History   Marital status: Legally Separated    Spouse name: Not on file   Number of children: Not on file   Years of education: Not on file   Highest education level: Not on file  Occupational History   Not on file  Tobacco Use   Smoking status: Every Day    Packs/day:  1.00    Years: 42.00    Total pack years: 42.00    Types: Cigarettes   Smokeless tobacco: Never   Tobacco comments:    prior trial of zyban made her feel weird, pt doesnt want info right now  Substance and Sexual Activity   Alcohol use: No   Drug use: Yes    Types: Marijuana    Comment: 05/01/2015 "I've used lots of multiple street drugs; nothing in the past couple years", "i use weed once a month"   Sexual activity: Yes  Other Topics Concern   Not on file  Social History Narrative   Not on file   Social Determinants of Health   Financial Resource Strain: Not on file  Food Insecurity: Not on file  Transportation Needs: Not on file  Physical Activity: Not on file  Stress: Not on file  Social Connections: Not on file   Additional Social History:    Allergies:   Allergies  Allergen Reactions   Haldol [Haloperidol Lactate] Other (See Comments)    Pt states that she was unable to talk or move.     Labs:  Results for orders placed or performed during the hospital encounter of 09/20/21 (from the past 48 hour(s))  Urinalysis, Routine w reflex microscopic     Status: Abnormal   Collection Time: 09/20/21 10:58 AM  Result Value Ref Range   Color, Urine COLORLESS (A) YELLOW   APPearance CLEAR CLEAR   Specific Gravity, Urine 1.002 (L) 1.005 - 1.030   pH 5.0 5.0 - 8.0   Glucose, UA NEGATIVE NEGATIVE mg/dL   Hgb urine dipstick NEGATIVE NEGATIVE   Bilirubin Urine NEGATIVE NEGATIVE   Ketones, ur NEGATIVE NEGATIVE mg/dL   Protein, ur NEGATIVE NEGATIVE mg/dL   Nitrite NEGATIVE NEGATIVE   Leukocytes,Ua NEGATIVE NEGATIVE    Comment: Performed at [facility] [address], [place], [place] [address]  CBC with Differential     Status: Abnormal   Collection Time: 09/20/21 11:12 AM  Result Value Ref Range   WBC 17.1 (H) 4.0 - 10.5 K/uL   RBC 4.17 3.87 - 5.11 MIL/uL   Hemoglobin 12.4 12.0 - 15.0 g/dL   HCT 38.1 36.0 - 46.0 %   MCV 91.4 80.0 - 100.0 fL   MCH 29.7 26.0 -  34.0 pg   MCHC 32.5 30.0 - 36.0 g/dL   RDW 14.2 11.5 - 15.5 %   Platelets 448 (H) 150 - 400 K/uL   nRBC 0.0 0.0 - 0.2 %   Neutrophils Relative % 68 %   Neutro Abs 11.6 (H) 1.7 - 7.7 K/uL   Lymphocytes Relative 23 %   Lymphs Abs 4.0 0.7 - 4.0 K/uL   Monocytes Relative 7 %   Monocytes Absolute 1.3 (H) 0.1 - 1.0 K/uL   Eosinophils Relative 1 %   Eosinophils Absolute 0.2 0.0 - 0.5 K/uL   Basophils Relative 1 %   Basophils Absolute 0.1 0.0 - 0.1 K/uL   Immature Granulocytes 0 %   Abs Immature Granulocytes 0.07 0.00 - 0.07 K/uL    Comment: Performed at [facility] [address], [place], [place] [address]  Comprehensive metabolic panel     Status: Abnormal   Collection Time: 09/20/21 11:12 AM  Result Value Ref Range   Sodium 133 (L) 135 - 145 mmol/L   Potassium 4.1 3.5 - 5.1 mmol/L   Chloride 103 98 - 111 mmol/L   CO2 18 (L) 22 - 32 mmol/L   Glucose, Bld 133 (H) 70 - 99 mg/dL    Comment: Glucose reference range applies only to samples taken after fasting for at least 8 hours.   BUN 20 8 - 23 mg/dL   Creatinine, Ser 0.89 0.44 - 1.00 mg/dL   Calcium 9.6 8.9 - 10.3 mg/dL   Total Protein 8.5 (H) 6.5 - 8.1 g/dL   Albumin 4.2 3.5 - 5.0 g/dL   AST 18 15 - 41 U/L   ALT 17 0 - 44 U/L   Alkaline Phosphatase 86 38 - 126 U/L   Total Bilirubin 0.4 0.3 - 1.2 mg/dL   GFR, Estimated >60 >60 mL/min    Comment: (NOTE) Calculated using the CKD-EPI Creatinine Equation (2021)    Anion gap 12 5 -  15    Comment: Performed at [facility], [place] [address], [place], [place] [address]  Ammonia     Status: None   Collection Time: 09/20/21 11:12 AM  Result Value Ref Range   Ammonia 32 9 - 35 umol/L    Comment: Performed at [facility], [place] [address], [place], [place] [address]  CBG monitoring, ED     Status: Abnormal   Collection Time: 09/20/21 11:51 AM  Result Value Ref Range   Glucose-Capillary 155 (H) 70 - 99 mg/dL    Comment: Glucose reference range applies only to samples  taken after fasting for at least 8 hours.  Rapid urine drug screen (hospital performed)     Status: Abnormal   Collection Time: 09/20/21 12:09 PM  Result Value Ref Range   Opiates NONE DETECTED NONE DETECTED   Cocaine NONE DETECTED NONE DETECTED   Benzodiazepines POSITIVE (A) NONE DETECTED   Amphetamines NONE DETECTED NONE DETECTED   Tetrahydrocannabinol POSITIVE (A) NONE DETECTED   Barbiturates NONE DETECTED NONE DETECTED    Comment: (NOTE) DRUG SCREEN FOR MEDICAL PURPOSES ONLY.  IF CONFIRMATION IS NEEDED FOR ANY PURPOSE, NOTIFY LAB WITHIN 5 DAYS.  LOWEST DETECTABLE LIMITS FOR URINE DRUG SCREEN Drug Class                     Cutoff (ng/mL) Amphetamine and metabolites    1000 Barbiturate and metabolites    200 Benzodiazepine                 601 Tricyclics and metabolites     300 Opiates and metabolites        300 Cocaine and metabolites        300 THC                            50 Performed at [facility], [place] [address], [place], [place] [address]   Ethanol     Status: None   Collection Time: 09/20/21  4:01 PM  Result Value Ref Range   Alcohol, Ethyl (B) <10 <10 mg/dL    Comment: (NOTE) Lowest detectable limit for serum alcohol is 10 mg/dL.  For medical purposes only. Performed at [facility], [place] [address], [place], [place] [address]     Current Facility-Administered Medications  Medication Dose Route Frequency Provider Last Rate Last Admin   ALPRAZolam Duanne Moron) tablet 0.5-1 mg  0.5-1 mg Oral Daily PRN Carmin Muskrat, MD   1 mg at 09/20/21 2143   amLODipine (NORVASC) tablet 10 mg  10 mg Oral Daily Carmin Muskrat, MD   10 mg at 09/20/21 2143   And   benazepril (LOTENSIN) tablet 40 mg  40 mg Oral Daily Carmin Muskrat, MD   40 mg at 09/21/21 0657   buprenorphine-naloxone (SUBOXONE) 8-2 mg per SL tablet 1 tablet  1 tablet Sublingual Daily Faylene Million, MD   1 tablet at 09/20/21 1339   fluticasone furoate-vilanterol (BREO ELLIPTA) 100-25 MCG/ACT 1  puff  1 puff Inhalation Daily Carmin Muskrat, MD       QUEtiapine (SEROQUEL) tablet 100 mg  100 mg Oral QHS Carmin Muskrat, MD   100 mg at 09/20/21 2143   umeclidinium bromide (INCRUSE ELLIPTA) 62.5 MCG/ACT 1 puff  1 puff Inhalation q morning Carmin Muskrat, MD       Current Outpatient Medications  Medication Sig Dispense Refill   albuterol (PROVENTIL HFA;VENTOLIN HFA)  108 (90 Base) MCG/ACT inhaler Inhale 2 puffs into the lungs every 6 (six) hours as needed for wheezing or shortness of breath. 1 Inhaler 3   ALPRAZolam (XANAX) 0.5 MG tablet Take 0.5-1 mg by mouth daily as needed for sleep.     alum & mag hydroxide-simeth (MAALOX ADVANCED MAX ST) 400-400-40 MG/5ML suspension Take 10 mLs by mouth every 6 (six) hours as needed for indigestion. 355 mL 0   amLODipine-benazepril (LOTREL) 10-40 MG capsule Take 1 capsule by mouth daily.     BIOTIN PO Take 1 capsule by mouth daily.     BREO ELLIPTA 100-25 MCG/ACT AEPB Inhale 1 puff into the lungs daily.     cyclobenzaprine (FLEXERIL) 10 MG tablet TAKE 1 TABLET(10 MG) BY MOUTH THREE TIMES DAILY AS NEEDED FOR MUSCLE SPASMS (Patient taking differently: Take 10 mg by mouth 3 (three) times daily as needed for muscle spasms.) 30 tablet 0   diphenhydrAMINE (BENADRYL) 25 MG tablet Take 50 mg by mouth every 6 (six) hours as needed for sleep.     ferrous sulfate 325 (65 FE) MG tablet Take 1 tablet (325 mg total) by mouth daily. 30 tablet 1   FLUoxetine (PROZAC) 40 MG capsule Take 40 mg by mouth daily.     ipratropium-albuterol (DUONEB) 0.5-2.5 (3) MG/3ML SOLN Inhale 3 mLs into the lungs every 6 (six) hours as needed for shortness of breath.     omeprazole (PRILOSEC) 40 MG capsule Take 40 mg by mouth daily.     Phenylephrine-DM-GG (MUCINEX FAST-MAX CONGEST COUGH PO) Take 2 tablets by mouth 2 (two) times daily as needed (congestion).     promethazine (PHENERGAN) 25 MG tablet TAKE 1 TABLET(25 MG) BY MOUTH EVERY 6 HOURS AS NEEDED FOR NAUSEA OR VOMITING (Patient  taking differently: Take 25 mg by mouth every 6 (six) hours as needed for vomiting or nausea.) 30 tablet 0   SUBOXONE 8-2 MG FILM Place 1 Film under the tongue 2 (two) times daily.     tiotropium (SPIRIVA HANDIHALER) 18 MCG inhalation capsule Place 1 capsule (18 mcg total) into inhaler and inhale every morning. (Patient taking differently: Place 18 mcg into inhaler and inhale daily.) 30 capsule 3   QUEtiapine (SEROQUEL) 100 MG tablet Take 100 mg by mouth at bedtime. (Patient not taking: Reported on 09/20/2021)      Psychiatric Specialty Exam: Presentation  General Appearance: Fairly Groomed  Eye Contact:Fair  Speech:Clear and Coherent; Pressured  Speech Volume:Normal  Handedness:No data recorded  Mood and Affect  Mood:Labile  Affect:Labile   Thought Process  Thought Processes:Coherent  Descriptions of Associations:Circumstantial  Orientation:Full (Time, Place and Person)  Thought Content:Scattered  History of Schizophrenia/Schizoaffective disorder:No data recorded Duration of Psychotic Symptoms:No data recorded Hallucinations:Hallucinations: Auditory Description of Auditory Hallucinations: hearing "queen and elizabeth"  Ideas of Reference:None  Suicidal Thoughts:Suicidal Thoughts: No  Homicidal Thoughts:Homicidal Thoughts: No   Sensorium  Memory:Immediate Fair  Judgment:Impaired  Insight:Fair   Executive Functions  Concentration:Fair  Attention Span:Fair  [place]   Psychomotor Activity  Psychomotor Activity:Psychomotor Activity: Normal   Assets  Assets:Communication Skills; Desire for Improvement; Physical Health; Resilience; Social Support    Sleep  Sleep:Sleep: Fair   Physical Exam: Physical Exam Neurological:     Mental Status: She is alert and oriented to person, place, and time.  Psychiatric:        Speech: Speech is rapid and pressured.        Behavior: Behavior is cooperative.  Review of Systems  Musculoskeletal:  Positive for back pain.  Psychiatric/Behavioral:  Positive for hallucinations.        Circumstantial, scattered thoughts at times, AH of hearing "queen elizabeth"    Blood pressure (!) 165/94, pulse (!) 108, temperature 98.7 F (37.1 C), temperature source Oral, resp. rate 18, last menstrual period 08/28/2012, SpO2 96 %. There is no height or weight on file to calculate BMI.  Medical Decision Making: Patient case reviewed and discussed with Dr. Leverne Humbles.  Patient does appear to have significant improvement from last night's presentation to this morning after Seroquel dose.  At this time will recommend overnight observation with reevaluation by psychiatry tomorrow.  EDP, RN, and LCSW notified of disposition.  Problem 1: AMS, insomnia - Increase Seroquel to 200 mg po at bed time   Disposition: No evidence of imminent risk to self or others at present.   Patient does not meet criteria for psychiatric inpatient admission.  Vesta Mixer, NP 09/21/2021 9:45 AM

## 2021-09-21 NOTE — ED Notes (Signed)
Attempted to complete assessment on pt however pt is not willing to answer questions, she is standing up attempting to get out of the room. Attempted to redirect pt but was unable to do so at this time. Pt is constantly trying to touch this RN's watch, badge holder, asking to stick her nose and give her a nose ring. At this time left the room and shut the door.

## 2021-09-21 NOTE — ED Notes (Signed)
Pt continues to wonder out of the room and all over the department in this area. She is now trying to grab and go through staffing belongings. She continues to talk about things constantly that has nothing to do with the situation. Spoke with provider reference same

## 2021-09-21 NOTE — ED Notes (Signed)
Pt is ambulatory wondering from the room. Talking about things that doesn't concern the situation. Difficult time redirecting her back into the room. Has attempted to walk to the door to leave but was redirectable within a few min back to room. Spoke to provider reference pt at this time. Order for safety sitter placed contacted staffing reference sitter and advised no sitter was available

## 2021-09-21 NOTE — BH Assessment (Signed)
TTS attempted to see will follow up with Pt's Nurse.

## 2021-09-21 NOTE — BH Assessment (Signed)
TTS clinician  spoke to [facility] to set TTS cart for Pt.  Clinician to call cart in 10 minutes.

## 2021-09-21 NOTE — ED Notes (Signed)
Received verbal report from Lysbeth Galas at this time

## 2021-09-21 NOTE — ED Notes (Signed)
Pt is sleeping

## 2021-09-21 NOTE — ED Notes (Signed)
Pt attempting to walk out of the unit. Pt has had to be redirected several times.

## 2021-09-21 NOTE — ED Notes (Signed)
Breakfast order placed ?

## 2021-09-22 DIAGNOSIS — R4182 Altered mental status, unspecified: Secondary | ICD-10-CM | POA: Diagnosis not present

## 2021-09-22 MED ORDER — LORAZEPAM 1 MG PO TABS
2.0000 mg | ORAL_TABLET | Freq: Once | ORAL | Status: AC
  Administered 2021-09-22: 2 mg via ORAL
  Filled 2021-09-22: qty 2

## 2021-09-22 MED ORDER — MIDAZOLAM HCL 2 MG/2ML IJ SOLN
4.0000 mg | Freq: Once | INTRAMUSCULAR | Status: AC
  Administered 2021-09-22: 4 mg via INTRAMUSCULAR
  Filled 2021-09-22: qty 4

## 2021-09-22 MED ORDER — ZIPRASIDONE HCL 20 MG PO CAPS
20.0000 mg | ORAL_CAPSULE | Freq: Once | ORAL | Status: AC
  Administered 2021-09-22: 20 mg via ORAL
  Filled 2021-09-22: qty 1

## 2021-09-22 NOTE — ED Notes (Signed)
This RN spoke with Madeline Dickerson. Madeline Dickerson stated pt cannot be transported to [facility] until after 9am on 09/23/2021. Agricultural consultant notified.

## 2021-09-22 NOTE — ED Notes (Signed)
Ms. Carton is interacting with her Visual and Audio Hallucinations, currently having pressured speech with word salad.

## 2021-09-22 NOTE — ED Notes (Signed)
IVC / [facility]

## 2021-09-22 NOTE — ED Notes (Signed)
Pt pulling at the cord to the bed and continues to get out of the bed, Madeline Dickerson is running into room 51

## 2021-09-22 NOTE — ED Provider Notes (Signed)
Event Note  Clinical Course as of 09/23/21 0136  Wed Sep 22, 2021  1703 Pt actively hallucinating. Has locked herself in the restroom. Not amenable to verbal de-escalation. Having paranoid delusions. IM versed ordered since last qtc was mildly prolonged. Repeat EKG ordered as well.  [RP]  Thu Sep 23, 2021  0131 IVC form filled out and handed to pt's nurse. Pt required additional dose of ativan PO and was calm afterwards.  Not require additional treatment during my care. [RP]    Clinical Course User Index [RP] Fransico Meadow, MD      Fransico Meadow, MD 09/23/21 [phone]

## 2021-09-22 NOTE — ED Provider Notes (Signed)
Emergency Medicine Observation Re-evaluation Note  Madeline Dickerson is a 61 y.o. female, seen on rounds today.  Pt initially presented to the ED for complaints of Altered Mental Status Currently, the patient is awake, alert, sitting up on the edge of her bed.  Physical Exam  BP 128/64 (BP Location: Left Arm)   Pulse 100   Temp 97.8 F (36.6 C) (Oral)   Resp 16   LMP 08/28/2012   SpO2 100%  Physical Exam General: No distress, alert Cardiac: Regular rate and rhythm Lungs: No increased work of breathing Psych: Alert, not insightful  ED Course / MDM  EKG:EKG Interpretation  Date/Time:  Monday September 20 2021 10:53:58 EDT Ventricular Rate:  113 PR Interval:  154 QRS Duration: 80 QT Interval:  336 QTC Calculation: 460 R Axis:   -13 Text Interpretation: Sinus tachycardia with occasional Premature ventricular complexes Otherwise normal ECG When compared with ECG of 03-Jun-2021 19:50, PREVIOUS ECG IS PRESENT No significant change since last tracing Confirmed by Blanchie Dessert [id number]) on 09/20/2021 11:30:34 AM  I have reviewed the labs performed to date as well as medications administered while in observation.  Recent changes in the last 24 hours include no notable changes.  Plan  Current plan is for psych evaluation and placement. Madeline Dickerson is under involuntary commitment.      Carmin Muskrat, MD 09/22/21 [phone]

## 2021-09-22 NOTE — ED Notes (Signed)
Pt is out of room to bathroom, hallucinating that this RN is "Britney" and "here to take her home". Verbally combative w/ sitter. PRN versed order obtained from [facility] and administered w/ out issue.

## 2021-09-22 NOTE — ED Notes (Signed)
Multiple redirections to her room. Pt non stop talking and responding to internal/external stimuli.  Madeline Dickerson is taking the brake off her bed and then walking around while pushing the bed.

## 2021-09-22 NOTE — ED Notes (Signed)
Walking out of room, having conversations with self..mostly about crack and meth.  Ms. Bohnsack escorted back into room

## 2021-09-22 NOTE — ED Notes (Signed)
Pt calling out hello, hello. When entered the room pt is stating that she needs to go to the restroom and is asking for something to drink. Offered pt water or juice pt requested water. Pt advised that there is someone in the restroom at this time and she could go once the other pt was out. Pt provided a cup of water at this time

## 2021-09-22 NOTE — Progress Notes (Signed)
[facility] called, requesting more clinical documentation. CSW sent requested information and awaiting follow up.  Benjaman Kindler, MSW, LCSWA 09/22/2021 2:20 PM

## 2021-09-22 NOTE — Consult Note (Signed)
Telepsych Consultation   Reason for Consult:  Psychiatric Reassessment Referring Physician:  Dr. Faylene Million Location of Patient:    [facility] Location of Provider: Other: virtual home office  Patient Identification: Madeline Dickerson MRN:  [id number] Principal Diagnosis: Altered mental status, unspecified Diagnosis:  Principal Problem:   Altered mental status, unspecified   Total Time spent with patient: 30 minutes  Subjective:   Madeline Dickerson is a 61 y.o. female patient admitted with altered mental status after a fall.    Patient seen via telepsych by this provider; chart reviewed and consulted with Dr. Dwyane Dee on 09/22/21.  On evaluation [name] is alert and oriented to name and location only; states she's in the hospital because she hit her had and her roommate wanted her to get checked out.  She appears confused but has some moments of clarity, is very disorganized and hyper verbal.  States she had cigarettes and a diet pepsi for breakfast today.  She is easily distracted and demonstrates a flight of ideas.  While talking with this Probation officer pt heard a phone ring, she states, "I have to get the phone before it wakes the dogs up."  She states she has not been sleeping good.  She was evaluated by psychiatry yesterday who thought she would be ready for discharge today.  In lieu of her presentation, recommending continued admission.    Per Head CT 09/19/2021: IMPRESSION: No evidence of acute intracranial abnormality.   Mild chronic small vessel image changes within the cerebral white matter.   Minimal generalized parenchymal atrophy.  Per ED Provider Admission Assessment 09/20/2021: History    Chief Complaint  Patient presents with   Altered Mental Status        Altered Mental Status 61 year old female past medical history notable for COPD, bipolar 1 disorder, pneumonia c/b septic shock (05/2021) who is presenting with altered mental status.  Initially brought in by EMS, who  reported that they were called by the patient's remainder stated the patient was acting "more confused than usual".  At baseline, patient reportedly has memory issues, but remains says that this is different, as the patient is making "nonsensical statements".  On review of systems, patient answers "yes, it all started 13.5 years ago" to all questions.  Regardless of questioning, patient intermittently appears to answer in a logical manner at times, followed by intermittent verbal outbursts of random words including "zero"," rotate", and "Shannon".   Patient's daughter arrived at bedside.  She is able to corroborate more history.  She reports that the patient will occasionally tell circular stories, but that she usually is able to be redirected.  She talked to her on Saturday, and felt that she was at her baseline.  However, she was called by her mom's roommate earlier today, who was concerned that the patient was not her normal self.  Upon arrival, patient's daughter says that this is different than the patient's baseline, and that she has "never seen her like this".  She reports that the patient stopped taking her Seroquel a few weeks ago, and that she was complaining on Saturday of running out of her home Suboxone.  She denies her mom complaining of any chest pain, difficulty breathing, pain, or generally feeling unwell.   Past Psychiatric History: Per chart review pt has hx of anxiety, depression, insomnia.  She reports having trouble with her memory.  Patient does report history of substance abuse.  Risk to Self:  yes Risk to Others:  yes Prior Inpatient Therapy: yes  Prior Outpatient Therapy:  unknown  Past Medical History:  Past Medical History:  Diagnosis Date   Anxiety    hx   Arthritis    "lower back, hands" (05/01/2015)   Chronic low back pain    disk s/p 4 diskectomies, 5th fursion, then spinal cord stimulator ([place])   Depression    Dysrhythmia    irruglar heart beat "nothing  wrong"    Fibromyalgia    GERD (gastroesophageal reflux disease)    Hypertension    Pneumonia 05/01/2015   PONV (postoperative nausea and vomiting)    Rotator cuff disorder    Seasonal asthma    "seasonal"   Shortness of breath dyspnea    Walking pneumonia     Past Surgical History:  Procedure Laterality Date   BACK SURGERY     CESAREAN SECTION  1986   COLONOSCOPY WITH PROPOFOL N/A 06/09/2021   Procedure: COLONOSCOPY WITH PROPOFOL;  Surgeon: Carol Ada, MD;  Location: [facility];  Service: Gastroenterology;  Laterality: N/A;   ESOPHAGOGASTRODUODENOSCOPY (EGD) WITH PROPOFOL N/A 06/09/2021   Procedure: ESOPHAGOGASTRODUODENOSCOPY (EGD) WITH PROPOFOL;  Surgeon: Carol Ada, MD;  Location: [facility];  Service: Gastroenterology;  Laterality: N/A;   LUMBAR MICRODISCECTOMY  01/1990; 2003   LUMBAR SPINE SURGERY  03/1991   "cleaned up scar tissue"   POLYPECTOMY  06/09/2021   Procedure: POLYPECTOMY;  Surgeon: Carol Ada, MD;  Location: [facility];  Service: Gastroenterology;;   POSTERIOR LUMBAR FUSION  2011; 04/27/2011   REPAIR DURAL / CSF LEAK  02/1990   SHOULDER ARTHROSCOPY Right 12/10/2019   Procedure: RIGHT SHOULDER ARTHROSCOPY AND DEBRIDEMENT;  Surgeon: Newt Minion, MD;  Location: [facility];  Service: Orthopedics;  Laterality: Right;   SPINAL CORD STIMULATOR IMPLANT  2012   SPINAL CORD STIMULATOR REMOVAL  08/2010   TUBAL LIGATION  1986   Family History:  Family History  Problem Relation Age of Onset   Stroke Father    Hypertension Father    Lung cancer Father    Family Psychiatric  History: unknown Social History:  Social History   Substance and Sexual Activity  Alcohol Use No     Social History   Substance and Sexual Activity  Drug Use Yes   Types: Marijuana   Comment: 05/01/2015 "I've used lots of multiple street drugs; nothing in the past couple years", "i use weed once a month"    Social History   Socioeconomic History   Marital status:  Legally Separated    Spouse name: Not on file   Number of children: Not on file   Years of education: Not on file   Highest education level: Not on file  Occupational History   Not on file  Tobacco Use   Smoking status: Every Day    Packs/day: 1.00    Years: 42.00    Total pack years: 42.00    Types: Cigarettes   Smokeless tobacco: Never   Tobacco comments:    prior trial of zyban made her feel weird, pt doesnt want info right now  Substance and Sexual Activity   Alcohol use: No   Drug use: Yes    Types: Marijuana    Comment: 05/01/2015 "I've used lots of multiple street drugs; nothing in the past couple years", "i use weed once a month"   Sexual activity: Yes  Other Topics Concern   Not on file  Social History Narrative   Not on file   Social Determinants of  Health   Financial Resource Strain: Not on file  Food Insecurity: Not on file  Transportation Needs: Not on file  Physical Activity: Not on file  Stress: Not on file  Social Connections: Not on file   Additional Social History:    Allergies:   Allergies  Allergen Reactions   Haldol [Haloperidol Lactate] Other (See Comments)    Pt states that she was unable to talk or move.     Labs:  Results for orders placed or performed during the hospital encounter of 09/20/21 (from the past 48 hour(s))  Ethanol     Status: None   Collection Time: 09/20/21  4:01 PM  Result Value Ref Range   Alcohol, Ethyl (B) <10 <10 mg/dL    Comment: (NOTE) Lowest detectable limit for serum alcohol is 10 mg/dL.  For medical purposes only. Performed at [facility], [place] [address], [place], [place] [address]     Medications:  Current Facility-Administered Medications  Medication Dose Route Frequency Provider Last Rate Last Admin   ALPRAZolam Duanne Moron) tablet 0.5-1 mg  0.5-1 mg Oral Daily PRN Carmin Muskrat, MD   1 mg at 09/22/21 0933   amLODipine (NORVASC) tablet 10 mg  10 mg Oral Daily Carmin Muskrat, MD   10 mg at  09/22/21 5993   And   benazepril (LOTENSIN) tablet 40 mg  40 mg Oral Daily Carmin Muskrat, MD   40 mg at 09/21/21 1130   buprenorphine-naloxone (SUBOXONE) 8-2 mg per SL tablet 1 tablet  1 tablet Sublingual Daily Faylene Million, MD   1 tablet at 09/22/21 0934   fluticasone furoate-vilanterol (BREO ELLIPTA) 100-25 MCG/ACT 1 puff  1 puff Inhalation Daily Carmin Muskrat, MD   1 puff at 09/22/21 0939   QUEtiapine (SEROQUEL) tablet 200 mg  200 mg Oral QHS Vesta Mixer, NP   200 mg at 09/21/21 2115   umeclidinium bromide (INCRUSE ELLIPTA) 62.5 MCG/ACT 1 puff  1 puff Inhalation q morning Carmin Muskrat, MD   1 puff at 09/22/21 5701   Current Outpatient Medications  Medication Sig Dispense Refill   albuterol (PROVENTIL HFA;VENTOLIN HFA) 108 (90 Base) MCG/ACT inhaler Inhale 2 puffs into the lungs every 6 (six) hours as needed for wheezing or shortness of breath. 1 Inhaler 3   ALPRAZolam (XANAX) 0.5 MG tablet Take 0.5-1 mg by mouth daily as needed for sleep.     alum & mag hydroxide-simeth (MAALOX ADVANCED MAX ST) 400-400-40 MG/5ML suspension Take 10 mLs by mouth every 6 (six) hours as needed for indigestion. 355 mL 0   amLODipine-benazepril (LOTREL) 10-40 MG capsule Take 1 capsule by mouth daily.     BIOTIN PO Take 1 capsule by mouth daily.     BREO ELLIPTA 100-25 MCG/ACT AEPB Inhale 1 puff into the lungs daily.     cyclobenzaprine (FLEXERIL) 10 MG tablet TAKE 1 TABLET(10 MG) BY MOUTH THREE TIMES DAILY AS NEEDED FOR MUSCLE SPASMS (Patient taking differently: Take 10 mg by mouth 3 (three) times daily as needed for muscle spasms.) 30 tablet 0   diphenhydrAMINE (BENADRYL) 25 MG tablet Take 50 mg by mouth every 6 (six) hours as needed for sleep.     ferrous sulfate 325 (65 FE) MG tablet Take 1 tablet (325 mg total) by mouth daily. 30 tablet 1   FLUoxetine (PROZAC) 40 MG capsule Take 40 mg by mouth daily.     ipratropium-albuterol (DUONEB) 0.5-2.5 (3) MG/3ML SOLN Inhale 3 mLs into the lungs every 6  (six) hours as needed  for shortness of breath.     omeprazole (PRILOSEC) 40 MG capsule Take 40 mg by mouth daily.     Phenylephrine-DM-GG (MUCINEX FAST-MAX CONGEST COUGH PO) Take 2 tablets by mouth 2 (two) times daily as needed (congestion).     promethazine (PHENERGAN) 25 MG tablet TAKE 1 TABLET(25 MG) BY MOUTH EVERY 6 HOURS AS NEEDED FOR NAUSEA OR VOMITING (Patient taking differently: Take 25 mg by mouth every 6 (six) hours as needed for vomiting or nausea.) 30 tablet 0   SUBOXONE 8-2 MG FILM Place 1 Film under the tongue 2 (two) times daily.     tiotropium (SPIRIVA HANDIHALER) 18 MCG inhalation capsule Place 1 capsule (18 mcg total) into inhaler and inhale every morning. (Patient taking differently: Place 18 mcg into inhaler and inhale daily.) 30 capsule 3   QUEtiapine (SEROQUEL) 100 MG tablet Take 100 mg by mouth at bedtime. (Patient not taking: Reported on 09/20/2021)      Musculoskeletal:pt moves all extremities and independently ambulates Strength & Muscle Tone: within normal limits Gait & Station: normal Patient leans: N/A  Psychiatric Specialty Exam:  Presentation  General Appearance: Bizarre (pt iniitally in the hallway, asked the nurse to join her in her room for assessment)  Eye Contact:Fair  Speech:Pressured  Speech Volume:Normal  Handedness:Right   Mood and Affect  Mood:Anxious  Affect:Congruent; Constricted   Thought Process  Thought Processes:Disorganized  Descriptions of Associations:Loose  Orientation:Partial (aware of name and location)  Thought Content:Delusions; Illogical  History of Schizophrenia/Schizoaffective disorder:Yes  Duration of Psychotic Symptoms:Greater than six months  Hallucinations:Hallucinations: None Description of Auditory Hallucinations: hearing "queen and elizabeth"  Ideas of Reference:None  Suicidal Thoughts:Suicidal Thoughts: No  Homicidal Thoughts:Homicidal Thoughts: No   Sensorium  Memory:Immediate Fair; Remote  Fair; Recent [place]   Executive Functions  Concentration:Poor  Attention Span:Poor  [place]  Language:Good   Psychomotor Activity  Psychomotor Activity:Psychomotor Activity: Restlessness   Assets  Assets:Communication Skills; Social Support; Housing; Catering manager   Sleep  Sleep:Sleep: Fair Number of Hours of Sleep: 5    Physical Exam: Physical Exam Cardiovascular:     Rate and Rhythm: Normal rate.     Pulses: Normal pulses.  Pulmonary:     Effort: Pulmonary effort is normal.  Musculoskeletal:        General: Normal range of motion.  Neurological:     Mental Status: She is alert.  Psychiatric:        Attention and Perception: Attention and perception normal.        Mood and Affect: Mood is anxious. Affect is blunt.        Speech: Speech is rapid and pressured.        Behavior: Behavior is hyperactive.        Thought Content: Thought content is delusional.        Cognition and Memory: Cognition is impaired. She exhibits impaired recent memory.        Judgment: Judgment is impulsive and inappropriate.    Review of Systems  Constitutional: Negative.   HENT: Negative.    Eyes: Negative.   Respiratory: Negative.    Cardiovascular: Negative.   Musculoskeletal: Negative.   Skin: Negative.   Neurological: Negative.   Endo/Heme/Allergies: Negative.   Psychiatric/Behavioral:  The patient is nervous/anxious.    Blood pressure 128/64, pulse 100, temperature 97.8 F (36.6 C), temperature source Oral, resp. rate 16, last menstrual period 08/28/2012, SpO2 100 %. There is no height or weight on file  to calculate BMI.  Treatment Plan Summary: Patient admitted with altered mental s/p fall and hitting her head; she has been medically cleared and continue to present with manic symptoms.  She has a history of bipolar disorder and has not been taking her medications.  She is not at baseline  and will require medication mgmt and monitoring for mood stability.  Daily contact with patient to assess and evaluate symptoms and progress in treatment and Medication management  EKG reviewed, no prolonged QT intervals.  Continue psych medications.   Disposition: Recommend psychiatric Inpatient admission when medically cleared.  Per [facility] there are no appropriate beds available.  SW will fax her out for accepting facilities geri psych.  Per nursing she requires assistance with ADLs.    This service was provided via telemedicine using a 2-way, interactive audio and video technology.  Names of all persons participating in this telemedicine service and their role in this encounter. Name: Abir Eroh Role: Patient   Name: Merlyn Lot Role: PMHNP    Mallie Darting, NP 09/22/2021 12:17 PM

## 2021-09-22 NOTE — ED Notes (Signed)
Versed admin, ED Tech at bedside, this RN requested pt be placed on pulse ox once she is resting comfortably from versed admin. Tech verbalized understanding and placed pt on pulse ox.

## 2021-09-22 NOTE — ED Notes (Signed)
Pt not eating her lunch..she is currently throwing her utensils.  Ms. Karstens holding an argument with self. And continues to walk in and out of room. Ms. Pouliot not aware of surroundings.

## 2021-09-22 NOTE — ED Notes (Signed)
Pt was medicated. Since medicated pt has become more aggressive and had needed more redirection verbally and physically. Pt also attempted to climb on the back of the bed and had to be assisted to come down.

## 2021-09-22 NOTE — ED Notes (Signed)
Pt is resting quietly with NAD noted, resp equal and non labored.

## 2021-09-22 NOTE — Progress Notes (Signed)
Inpatient Behavioral Health Placement  Pt meets inpatient criteria per Merlyn Lot, NP.  There are no appropriate beds at [facility]. There are no available beds at [facility] per Dr. Weber Cooks and Dr. Louis Meckel. Referral was sent to the following facilities;   Destination Service Provider Address Phone [facility]  [address] [place] [address] [place]  [facility]  [address] [place] [address] [phone] [facility]  [place], [place] [address] [phone]  [facility]  [place], [place] [address] [phone]  [facility]  [address] [place] [address] [place]  [facility]  [address] [place] [address] [phone]  [facility]  [address] [place] [address] [phone]  [facility]  [address] [place] [address] [phone] [place]  [address] [place] [address] [phone] [facility]  [address], [place] [address] [phone] [facility]  [address] [place] [address] [place]  [address] [place] [address] [phone] [facility]  [address], [place] [address] [phone] [facility]  [place] [address] [phone]  [place], [place] [address] [phone] [facility]  [place], [place] [address] [phone]   [facility]  [address] [place], [place] [address] [phone]  [facility]  [address], [place]  [address] [phone]    Situation ongoing,  CSW will follow up.   Benjaman Kindler, MSW, LCSWA 09/22/2021  @ 1:15 PM

## 2021-09-22 NOTE — ED Notes (Signed)
Family member in room with patient Larene [place]) daughter. Cursing with bouts of being nice to daughter.

## 2021-09-22 NOTE — ED Notes (Signed)
Ms. Surprenant is hostile and keeps getting out of bed, more aggressive verbally. Getting threatening to other patient's.

## 2021-09-22 NOTE — ED Notes (Signed)
This RN unable to locate pt's IVC paperwork. This RN did not receive pt's IVC paperwork in report. This RN spoke with ED secretaries about pt's IVC paperwork. IVC paperwork still unable to be located. No note in pt's chart about IVC paperwork being completed. This RN spoke with MD Philip Aspen and requested IVC paperwork be completed.

## 2021-09-22 NOTE — ED Notes (Signed)
Multiple attempts to redirect Madeline Dickerson back to her room . Continues to speak to persons only she can interact with .

## 2021-09-22 NOTE — Progress Notes (Signed)
Pt was accepted to [facility] 09/22/[facility]; Madeline Dickerson  Pt meets inpatient criteria per [facility]   Attending Physician will be Dr. Reece Levy  Report can be called to: [phone] or [phone]  Pt can arrive after: Bed is Ready now  Care Team notified: Merlyn Lot, NP, and Ancil Boozer, RN  [name], [name] 09/22/2021 @ 4:23 PM

## 2021-09-22 NOTE — ED Notes (Signed)
Verbal report given to Idelle Jo RN at this time

## 2021-09-23 ENCOUNTER — Ambulatory Visit: Payer: Medicare Other | Admitting: Orthopedic Surgery

## 2021-09-23 DIAGNOSIS — R4182 Altered mental status, unspecified: Secondary | ICD-10-CM | POA: Diagnosis not present

## 2021-09-23 MED ORDER — ALPRAZOLAM 0.5 MG PO TABS
0.5000 mg | ORAL_TABLET | Freq: Once | ORAL | Status: AC
  Administered 2021-09-23: 0.5 mg via ORAL
  Filled 2021-09-23: qty 1

## 2021-09-23 NOTE — ED Provider Notes (Signed)
Emergency Medicine Observation Re-evaluation Note  Madeline Dickerson is a 61 y.o. female, seen on rounds today.  Pt initially presented to the ED for complaints of Altered Mental Status Currently, the patient is eating breakfast, no complaints.  Physical Exam  BP 130/78 (BP Location: Right Arm)   Pulse 93   Temp 98.9 F (37.2 C) (Oral)   Resp 19   LMP 08/28/2012   SpO2 100%  Physical Exam General: Alert, NAD Cardiac: Regular rate Lungs: No respiratory distress Psych: animated affect  ED Course / MDM  EKG:EKG Interpretation  Date/Time:  Monday September 20 2021 10:53:58 EDT Ventricular Rate:  113 PR Interval:  154 QRS Duration: 80 QT Interval:  336 QTC Calculation: 460 R Axis:   -13 Text Interpretation: Sinus tachycardia with occasional Premature ventricular complexes Otherwise normal ECG When compared with ECG of 03-Jun-2021 19:50, PREVIOUS ECG IS PRESENT No significant change since last tracing Confirmed by Blanchie Dessert [phone]) on 09/20/2021 11:30:34 AM  I have reviewed the labs performed to date as well as medications administered while in observation.  Recent changes in the last 24 hours include received IM versed for being agitated  Plan  Current plan is for psychiatric placement. Madeline Dickerson is under involuntary commitment.      Cristie Hem, MD 09/23/21 [phone]

## 2021-09-23 NOTE — ED Notes (Signed)
Called pt daughter to [place] at this and provided update.

## 2021-09-23 NOTE — ED Notes (Addendum)
Called report to [place] Report provided to nurse and all questions answered.

## 2021-09-23 NOTE — ED Notes (Signed)
IVC paperwork given to Network engineer.

## 2021-09-23 NOTE — ED Notes (Signed)
Pt restless and walking around department. Pt reports feeling anxious and restless.Provider notified. Called Tiffany at [facility] at this time. Tiffany aware that pt is medically cleared for admission.

## 2021-09-23 NOTE — ED Notes (Signed)
Vital signs stable. 

## 2021-09-23 NOTE — ED Notes (Signed)
Pt restless and talking about her children, her socks, the room, her dreams. Pt denies SI/HI. Pt oriented to name, DOB, physical address, place and day. Pt showered this AM. Pt ambulates well with one assistant. Called over to [place] [facility] and spoke to [place]. Per Tiffany need to verify pt is medically cleared for admission.

## 2021-09-23 NOTE — ED Notes (Addendum)
Sheriffs department in ED to transport the pt to [place]. All documentation completed and paperwork provided. Pt verbalizes understanding. Pt belongings given to [facility] Department. Pt left dept. Via ambulation with [employer].

## 2021-10-12 DIAGNOSIS — R739 Hyperglycemia, unspecified: Secondary | ICD-10-CM | POA: Diagnosis not present

## 2021-10-12 DIAGNOSIS — Z79899 Other long term (current) drug therapy: Secondary | ICD-10-CM | POA: Diagnosis not present

## 2021-10-12 DIAGNOSIS — E785 Hyperlipidemia, unspecified: Secondary | ICD-10-CM | POA: Diagnosis not present

## 2021-10-14 DIAGNOSIS — Z79891 Long term (current) use of opiate analgesic: Secondary | ICD-10-CM | POA: Diagnosis not present

## 2021-10-18 DIAGNOSIS — R112 Nausea with vomiting, unspecified: Secondary | ICD-10-CM | POA: Diagnosis not present

## 2021-10-25 ENCOUNTER — Other Ambulatory Visit: Payer: Self-pay | Admitting: Family

## 2021-10-27 ENCOUNTER — Other Ambulatory Visit: Payer: Self-pay | Admitting: Family

## 2021-11-09 DIAGNOSIS — Z79891 Long term (current) use of opiate analgesic: Secondary | ICD-10-CM | POA: Diagnosis not present

## 2021-12-01 DIAGNOSIS — R928 Other abnormal and inconclusive findings on diagnostic imaging of breast: Secondary | ICD-10-CM | POA: Diagnosis not present

## 2021-12-03 ENCOUNTER — Ambulatory Visit: Payer: Self-pay | Admitting: Licensed Clinical Social Worker

## 2021-12-03 NOTE — Patient Outreach (Signed)
  Care Coordination   12/03/2021 Name: Madeline Dickerson MRN: [id number] DOB: [birth year]   Care Coordination Outreach Attempts:  An unsuccessful telephone outreach was attempted today to offer the patient information about available care coordination services as a benefit of their health plan.   Follow Up Plan:  Additional outreach attempts will be made to offer the patient care coordination information and services.   Encounter Outcome:  No Answer  Care Coordination Interventions Activated:  No   Care Coordination Interventions:  No, not indicated    Lenor Derrick , MSW Social Worker [facility] Care Management  [phone]

## 2021-12-08 ENCOUNTER — Ambulatory Visit: Payer: Self-pay | Admitting: Licensed Clinical Social Worker

## 2021-12-08 NOTE — Patient Outreach (Signed)
  Care Coordination   12/08/2021 Name: Madeline Dickerson MRN: [id number] DOB: 1960/08/04   Care Coordination Outreach Attempts:  A second unsuccessful outreach was attempted today to offer the patient with information about available care coordination services as a benefit of their health plan.     Follow Up Plan:  Additional outreach attempts will be made to offer the patient care coordination information and services.   Encounter Outcome:  No Answer  Care Coordination Interventions Activated:  No   Care Coordination Interventions:  No, not indicated    Milus Height, MSW, [place]  Social Worker [facility] Care Management  [phone]

## 2021-12-13 ENCOUNTER — Other Ambulatory Visit: Payer: Self-pay | Admitting: Family

## 2021-12-13 ENCOUNTER — Ambulatory Visit: Payer: Self-pay | Admitting: Licensed Clinical Social Worker

## 2021-12-13 NOTE — Patient Outreach (Signed)
  Care Coordination   12/13/2021 Name: Madeline Dickerson MRN: [id number] DOB: 03-28-1960   Care Coordination Outreach Attempts:  A third unsuccessful outreach was attempted today to offer the patient with information about available care coordination services as a benefit of their health plan.   Follow Up Plan:  No further outreach attempts will be made at this time. We have been unable to contact the patient to offer or enroll patient in care coordination services  Encounter Outcome:  No Answer  Care Coordination Interventions Activated:  No   Care Coordination Interventions:  No, not indicated    Milus Height, [place] Worker [facility] Care Management  [phone]

## 2021-12-14 DIAGNOSIS — Z23 Encounter for immunization: Secondary | ICD-10-CM | POA: Diagnosis not present

## 2021-12-14 DIAGNOSIS — Z79891 Long term (current) use of opiate analgesic: Secondary | ICD-10-CM | POA: Diagnosis not present

## 2021-12-21 DIAGNOSIS — Z79899 Other long term (current) drug therapy: Secondary | ICD-10-CM | POA: Diagnosis not present

## 2022-03-09 DIAGNOSIS — Z79891 Long term (current) use of opiate analgesic: Secondary | ICD-10-CM | POA: Diagnosis not present

## 2022-04-05 DIAGNOSIS — Z79891 Long term (current) use of opiate analgesic: Secondary | ICD-10-CM | POA: Diagnosis not present

## 2022-07-06 DIAGNOSIS — I10 Essential (primary) hypertension: Secondary | ICD-10-CM | POA: Diagnosis not present

## 2022-07-27 DIAGNOSIS — I10 Essential (primary) hypertension: Secondary | ICD-10-CM | POA: Diagnosis not present

## 2022-08-10 DIAGNOSIS — Z79891 Long term (current) use of opiate analgesic: Secondary | ICD-10-CM | POA: Diagnosis not present

## 2022-08-31 DIAGNOSIS — I10 Essential (primary) hypertension: Secondary | ICD-10-CM | POA: Diagnosis not present

## 2022-09-21 DIAGNOSIS — Z79891 Long term (current) use of opiate analgesic: Secondary | ICD-10-CM | POA: Diagnosis not present

## 2022-11-09 DIAGNOSIS — Z79891 Long term (current) use of opiate analgesic: Secondary | ICD-10-CM | POA: Diagnosis not present

## 2022-12-21 DIAGNOSIS — Z79891 Long term (current) use of opiate analgesic: Secondary | ICD-10-CM | POA: Diagnosis not present

## 2023-01-25 DIAGNOSIS — Z23 Encounter for immunization: Secondary | ICD-10-CM | POA: Diagnosis not present

## 2023-01-25 DIAGNOSIS — Z79891 Long term (current) use of opiate analgesic: Secondary | ICD-10-CM | POA: Diagnosis not present

## 2024-03-13 ENCOUNTER — Ambulatory Visit: Admitting: Family
